# Patient Record
Sex: Female | Born: 1970
Health system: Southern US, Community
[De-identification: ages and names within clinical notes are randomized; demographics above are authoritative.]

## PROBLEM LIST (undated history)

## (undated) DIAGNOSIS — D649 Anemia, unspecified: Secondary | ICD-10-CM

## (undated) DIAGNOSIS — T883XXA Malignant hyperthermia due to anesthesia, initial encounter: Secondary | ICD-10-CM

## (undated) DIAGNOSIS — F419 Anxiety disorder, unspecified: Secondary | ICD-10-CM

## (undated) DIAGNOSIS — F17210 Nicotine dependence, cigarettes, uncomplicated: Secondary | ICD-10-CM

## (undated) DIAGNOSIS — I480 Paroxysmal atrial fibrillation: Secondary | ICD-10-CM

## (undated) DIAGNOSIS — G4730 Sleep apnea, unspecified: Secondary | ICD-10-CM

## (undated) DIAGNOSIS — K219 Gastro-esophageal reflux disease without esophagitis: Secondary | ICD-10-CM

## (undated) DIAGNOSIS — I10 Essential (primary) hypertension: Secondary | ICD-10-CM

## (undated) DIAGNOSIS — I493 Ventricular premature depolarization: Secondary | ICD-10-CM

## (undated) DIAGNOSIS — Z5189 Encounter for other specified aftercare: Secondary | ICD-10-CM

## (undated) DIAGNOSIS — M109 Gout, unspecified: Secondary | ICD-10-CM

## (undated) DIAGNOSIS — T7840XA Allergy, unspecified, initial encounter: Secondary | ICD-10-CM

## (undated) HISTORY — DX: Allergy, unspecified, initial encounter: T78.40XA

## (undated) HISTORY — DX: Encounter for other specified aftercare: Z51.89

## (undated) HISTORY — PX: TUBAL LIGATION: SHX77

## (undated) HISTORY — DX: Anxiety disorder, unspecified: F41.9

## (undated) HISTORY — DX: Sleep apnea, unspecified: G47.30

## (undated) HISTORY — PX: NECK SURGERY: SHX720

## (undated) HISTORY — DX: Gastro-esophageal reflux disease without esophagitis: K21.9

## (undated) HISTORY — DX: Gout, unspecified: M10.9

## (undated) HISTORY — DX: Anemia, unspecified: D64.9

---

## 2008-12-20 ENCOUNTER — Inpatient Hospital Stay (HOSPITAL_COMMUNITY): Admission: AD | Admit: 2008-12-20 | Discharge: 2008-12-20 | Payer: Self-pay | Admitting: Obstetrics and Gynecology

## 2009-01-08 ENCOUNTER — Ambulatory Visit (HOSPITAL_COMMUNITY): Admission: RE | Admit: 2009-01-08 | Discharge: 2009-01-08 | Payer: Self-pay | Admitting: Obstetrics and Gynecology

## 2009-03-22 DIAGNOSIS — M109 Gout, unspecified: Secondary | ICD-10-CM

## 2009-03-22 HISTORY — DX: Gout, unspecified: M10.9

## 2009-05-13 ENCOUNTER — Ambulatory Visit (HOSPITAL_COMMUNITY): Admission: RE | Admit: 2009-05-13 | Discharge: 2009-05-13 | Payer: Self-pay | Admitting: Obstetrics and Gynecology

## 2009-06-02 ENCOUNTER — Inpatient Hospital Stay (HOSPITAL_COMMUNITY): Admission: RE | Admit: 2009-06-02 | Discharge: 2009-06-04 | Payer: Self-pay | Admitting: Obstetrics and Gynecology

## 2009-06-02 ENCOUNTER — Encounter (INDEPENDENT_AMBULATORY_CARE_PROVIDER_SITE_OTHER): Payer: Self-pay | Admitting: Obstetrics and Gynecology

## 2009-06-03 ENCOUNTER — Encounter (INDEPENDENT_AMBULATORY_CARE_PROVIDER_SITE_OTHER): Payer: Self-pay | Admitting: Obstetrics and Gynecology

## 2010-06-15 LAB — CBC
HCT: 27.6 % — ABNORMAL LOW (ref 36.0–46.0)
Hemoglobin: 10.2 g/dL — ABNORMAL LOW (ref 12.0–15.0)
Hemoglobin: 9 g/dL — ABNORMAL LOW (ref 12.0–15.0)
Hemoglobin: 9.4 g/dL — ABNORMAL LOW (ref 12.0–15.0)
MCHC: 32.7 g/dL (ref 30.0–36.0)
MCHC: 33.2 g/dL (ref 30.0–36.0)
MCV: 88.8 fL (ref 78.0–100.0)
RBC: 3.11 MIL/uL — ABNORMAL LOW (ref 3.87–5.11)
RBC: 3.24 MIL/uL — ABNORMAL LOW (ref 3.87–5.11)
RDW: 14 % (ref 11.5–15.5)
WBC: 11.1 10*3/uL — ABNORMAL HIGH (ref 4.0–10.5)
WBC: 17 10*3/uL — ABNORMAL HIGH (ref 4.0–10.5)
WBC: 18 10*3/uL — ABNORMAL HIGH (ref 4.0–10.5)

## 2010-06-15 LAB — URINALYSIS, MICROSCOPIC ONLY
Ketones, ur: NEGATIVE mg/dL
Leukocytes, UA: NEGATIVE
Nitrite: NEGATIVE
Urobilinogen, UA: 0.2 mg/dL (ref 0.0–1.0)
WBC, UA: NONE SEEN WBC/hpf (ref <3)

## 2010-06-15 LAB — COMPREHENSIVE METABOLIC PANEL
Albumin: 2.4 g/dL — ABNORMAL LOW (ref 3.5–5.2)
Alkaline Phosphatase: 118 U/L — ABNORMAL HIGH (ref 39–117)
BUN: 3 mg/dL — ABNORMAL LOW (ref 6–23)
Chloride: 101 mEq/L (ref 96–112)
Creatinine, Ser: 0.5 mg/dL (ref 0.4–1.2)
GFR calc Af Amer: >60 mL/min (ref >60)
GFR calc non Af Amer: >60 mL/min (ref >60)
Glucose, Bld: 109 mg/dL — ABNORMAL HIGH (ref 70–99)
Total Protein: 5.4 g/dL — ABNORMAL LOW (ref 6.0–8.3)

## 2010-06-15 LAB — LACTATE DEHYDROGENASE: LDH: 165 U/L (ref 94–250)

## 2010-06-15 LAB — URIC ACID: Uric Acid, Serum: 4.2 mg/dL (ref 2.4–7.0)

## 2010-06-25 LAB — COMPREHENSIVE METABOLIC PANEL
AST: 16 U/L (ref 0–37)
Albumin: 3.4 g/dL — ABNORMAL LOW (ref 3.5–5.2)
CO2: 24 mEq/L (ref 19–32)
Calcium: 9.1 mg/dL (ref 8.4–10.5)
Chloride: 105 mEq/L (ref 96–112)
Creatinine, Ser: 0.42 mg/dL (ref 0.4–1.2)
Glucose, Bld: 79 mg/dL (ref 70–99)
Sodium: 136 mEq/L (ref 135–145)

## 2010-06-25 LAB — CBC
HCT: 34.5 % — ABNORMAL LOW (ref 36.0–46.0)
Platelets: 329 10*3/uL (ref 150–400)
RBC: 3.8 MIL/uL — ABNORMAL LOW (ref 3.87–5.11)

## 2010-06-25 LAB — DIFFERENTIAL
Basophils Absolute: 0 10*3/uL (ref 0.0–0.1)
Basophils Relative: 0 % (ref 0–1)
Eosinophils Relative: 2 % (ref 0–5)
Monocytes Relative: 4 % (ref 3–12)
Neutrophils Relative %: 68 % (ref 43–77)

## 2011-12-14 ENCOUNTER — Emergency Department (HOSPITAL_COMMUNITY)
Admission: EM | Admit: 2011-12-14 | Discharge: 2011-12-14 | Disposition: A | Discharge status: Home / Self Care | Payer: BC Managed Care – PPO | Attending: Emergency Medicine | Admitting: Emergency Medicine

## 2011-12-14 ENCOUNTER — Encounter (HOSPITAL_COMMUNITY): Payer: Self-pay | Admitting: Emergency Medicine

## 2011-12-14 ENCOUNTER — Ambulatory Visit (INDEPENDENT_AMBULATORY_CARE_PROVIDER_SITE_OTHER): Payer: BC Managed Care – PPO | Admitting: Emergency Medicine

## 2011-12-14 VITALS — BP 142/102 | HR 91 | Temp 98.6°F | Resp 16 | Ht 62.5 in | Wt 163.4 lb

## 2011-12-14 DIAGNOSIS — J40 Bronchitis, not specified as acute or chronic: Secondary | ICD-10-CM

## 2011-12-14 DIAGNOSIS — K529 Noninfective gastroenteritis and colitis, unspecified: Secondary | ICD-10-CM

## 2011-12-14 DIAGNOSIS — R197 Diarrhea, unspecified: Secondary | ICD-10-CM | POA: Insufficient documentation

## 2011-12-14 DIAGNOSIS — R5381 Other malaise: Secondary | ICD-10-CM | POA: Insufficient documentation

## 2011-12-14 DIAGNOSIS — A088 Other specified intestinal infections: Secondary | ICD-10-CM

## 2011-12-14 HISTORY — DX: Essential (primary) hypertension: I10

## 2011-12-14 LAB — CBC WITH DIFFERENTIAL/PLATELET
Basophils Absolute: 0 10*3/uL (ref 0.0–0.1)
Basophils Relative: 1 % (ref 0–1)
Eosinophils Absolute: 0.1 10*3/uL (ref 0.0–0.7)
HCT: 40.7 % (ref 36.0–46.0)
Lymphocytes Relative: 60 % — ABNORMAL HIGH (ref 12–46)
MCV: 84.6 fL (ref 78.0–100.0)
Neutro Abs: 1 10*3/uL — ABNORMAL LOW (ref 1.7–7.7)
RBC: 4.81 MIL/uL (ref 3.87–5.11)
WBC: 3.9 10*3/uL — ABNORMAL LOW (ref 4.0–10.5)

## 2011-12-14 LAB — BASIC METABOLIC PANEL
BUN: 6 mg/dL (ref 6–23)
CO2: 24 mEq/L (ref 19–32)
Chloride: 102 mEq/L (ref 96–112)
Creatinine, Ser: 0.69 mg/dL (ref 0.50–1.10)
GFR calc Af Amer: >90 mL/min (ref >90)
Sodium: 138 mEq/L (ref 135–145)

## 2011-12-14 MED ORDER — HYDROCOD POLST-CHLORPHEN POLST 10-8 MG/5ML PO LQCR: 5.0000 mL | Freq: Two times a day (BID) | ORAL | Status: DC | PRN

## 2011-12-14 MED ORDER — LOPERAMIDE HCL 2 MG PO TABS: ORAL_TABLET | ORAL | Status: DC

## 2011-12-14 NOTE — Patient Instructions (Addendum)
Hypokalemia Hypokalemia means a low potassium level in the blood.Potassium is an electrolyte that helps regulate the amount of fluid in the body. It also stimulates muscle contraction and maintains a stable acid-base balance.Most of the body's potassium is inside of cells, and only a very small amount is in the blood. Because the amount in the blood is so small, minor changes can have big effects. PREPARATION FOR TEST Testing for potassium requires taking a blood sample taken by needle from a vein in the arm. The skin is cleaned thoroughly before the sample is drawn. There is no other special preparation needed. NORMAL VALUES Potassium levels below 3.5 mEq/L are abnormally low. Levels above 5.1 mEq/L are abnormally high. Ranges for normal findings may vary among different laboratories and hospitals. You should always check with your doctor after having lab work or other tests done to discuss the meaning of your test results and whether your values are considered within normal limits. MEANING OF TEST  Your caregiver will go over the test results with you and discuss the importance and meaning of your results, as well as treatment options and the need for additional tests, if necessary. A potassium level is frequently part of a routine medical exam. It is usually included as part of a whole "panel" of tests for several blood salts (such as Sodium and Chloride). It may be done as part of follow-up when a low potassium level was found in the past or other blood salts are suspected of being out of balance. A low potassium level might be suspected if you have one or more of the following:  Symptoms of weakness.   Abnormal heart rhythms.   High blood pressure and are taking medication to control this, especially water pills (diuretics).   Kidney disease that can affect your potassium level .   Diabetes requiring the use of insulin. The potassium may fall after taking insulin, especially if the  diabetes had been out of control for a while.   A condition requiring the use of cortisone-type medication or certain types of antibiotics.   Vomiting and/or diarrhea for more than a day or two.   A stomach or intestinal condition that may not permit appropriate absorption of potassium.   Fainting episodes.   Mental confusion.  OBTAINING TEST RESULTS It is your responsibility to obtain your test results. Ask the lab or department performing the test when and how you will get your results.  Please contact your caregiver directly if you have not received the results within one week. At that time, ask if there is anything different or new you should be doing in relation to the results. TREATMENT Hypokalemia can be treated with potassium supplements taken by mouth and/or adjustments in your current medications. A diet high in potassium is also helpful. Foods with high potassium content are:  Peas, lentils, lima beans, nuts, and dried fruit.   Whole grain and bran cereals and breads.   Fresh fruit, vegetables (bananas, cantaloupe, grapefruit, oranges, tomatoes, honeydew melons, potatoes).   Orange and tomato juices.   Meats. If potassium supplement has been prescribed for you today or your medications have been adjusted, see your personal caregiver in time02 for a re-check.  SEEK MEDICAL CARE IF:  There is a feeling of worsening weakness.   You experience repeated chest palpitations.   You are diabetic and having difficulty keeping your blood sugars in the normal range.   You are experiencing vomiting and/or diarrhea.   You are having  difficulty with any of your regular medications.  SEEK IMMEDIATE MEDICAL CARE IF:  You experience chest pain, shortness of breath, or episodes of dizziness.   You have been having vomiting or diarrhea for more than 2 days.   You have a fainting episode.  MAKE SURE YOU:   Understand these instructions.   Will watch your condition.   Will  get help right away if you are not doing well or get worse.  Document Released: 03/08/2005 Document Revised: 02/25/2011 Document Reviewed: 02/17/2008 [facility] Patient Information 2012 [place], [place].Bronchitis Bronchitis is the body's way of reacting to injury and/or infection (inflammation) of the bronchi. Bronchi are the air tubes that extend from the windpipe into the lungs. If the inflammation becomes severe, it may cause shortness of breath. CAUSES  Inflammation may be caused by:  A virus.   Germs (bacteria).   Dust.   Allergens.   Pollutants and many other irritants.  The cells lining the bronchial tree are covered with tiny hairs (cilia). These constantly beat upward, away from the lungs, toward the mouth. This keeps the lungs free of pollutants. When these cells become too irritated and are unable to do their job, mucus begins to develop. This causes the characteristic cough of bronchitis. The cough clears the lungs when the cilia are unable to do their job. Without either of these protective mechanisms, the mucus would settle in the lungs. Then you would develop pneumonia. Smoking is a common cause of bronchitis and can contribute to pneumonia. Stopping this habit is the single most important thing you can do to help yourself. TREATMENT   Your caregiver may prescribe an antibiotic if the cough is caused by bacteria. Also, medicines that open up your airways make it easier to breathe. Your caregiver may also recommend or prescribe an expectorant. It will loosen the mucus to be coughed up. Only take over-the-counter or prescription medicines for pain, discomfort, or fever as directed by your caregiver.   Removing whatever causes the problem (smoking, for example) is critical to preventing the problem from getting worse.   Cough suppressants may be prescribed for relief of cough symptoms.   Inhaled medicines may be prescribed to help with symptoms now and to help prevent problems  from returning.   For those with recurrent (chronic) bronchitis, there may be a need for steroid medicines.  SEEK IMMEDIATE MEDICAL CARE IF:   During treatment, you develop more pus-like mucus (purulent sputum).   You have a fever.   Your baby is older than 3 months with a rectal temperature of 102 F (38.9 C) or higher.   Your baby is 48 months old or younger with a rectal temperature of 100.4 F (38 C) or higher.   You become progressively more ill.   You have increased difficulty breathing, wheezing, or shortness of breath.  It is necessary to seek immediate medical care if you are elderly or sick from any other disease. MAKE SURE YOU:   Understand these instructions.   Will watch your condition.   Will get help right away if you are not doing well or get worse.  Document Released: 03/08/2005 Document Revised: 02/25/2011 Document Reviewed: 01/16/2008 [facility] Patient Information 2012 [place], [place].Diarrhea Infections caused by germs (bacterial) or a virus commonly cause diarrhea. Your caregiver has determined that with time, rest and fluids, the diarrhea should improve. In general, eat normally while drinking more water than usual. Although water may prevent dehydration, it does not contain salt and minerals (electrolytes). Broths,  weak tea without caffeine and oral rehydration solutions (ORS) replace fluids and electrolytes. Small amounts of fluids should be taken frequently. Large amounts at one time may not be tolerated. Plain water may be harmful in infants and the elderly. Oral rehydrating solutions (ORS) are available at pharmacies and grocery stores. ORS replace water and important electrolytes in proper proportions. Sports drinks are not as effective as ORS and may be harmful due to sugars worsening diarrhea.  ORS is especially recommended for use in children with diarrhea. As a general guideline for children, replace any new fluid losses from diarrhea and/or vomiting with  ORS as follows:   If your child weighs 22 pounds or under (10 kg or less), give 60-120 mL ( -  cup or 2 - 4 ounces) of ORS for each episode of diarrheal stool or vomiting episode.   If your child weighs more than 22 pounds (more than 10 kgs), give 120-240 mL ( - 1 cup or 4 - 8 ounces) of ORS for each diarrheal stool or episode of vomiting.   While correcting for dehydration, children should eat normally. However, foods high in sugar should be avoided because this may worsen diarrhea. Large amounts of carbonated soft drinks, juice, gelatin desserts and other highly sugared drinks should be avoided.   After correction of dehydration, other liquids that are appealing to the child may be added. Children should drink small amounts of fluids frequently and fluids should be increased as tolerated. Children should drink enough fluids to keep urine clear or pale yellow.   Adults should eat normally while drinking more fluids than usual. Drink small amounts of fluids frequently and increase as tolerated. Drink enough fluids to keep urine clear or pale yellow. Broths, weak decaffeinated tea, lemon lime soft drinks (allowed to go flat) and ORS replace fluids and electrolytes.   Avoid:   Carbonated drinks.   Juice.   Extremely hot or cold fluids.   Caffeine drinks.   Fatty, greasy foods.   Alcohol.   Tobacco.   Too much intake of anything at one time.   Gelatin desserts.   Probiotics are active cultures of beneficial bacteria. They may lessen the amount and number of diarrheal stools in adults. Probiotics can be found in yogurt with active cultures and in supplements.   Wash hands well to avoid spreading bacteria and virus.   Anti-diarrheal medications are not recommended for infants and children.   Only take over-the-counter or prescription medicines for pain, discomfort or fever as directed by your caregiver. Do not give aspirin to children because it may cause Reye's Syndrome.   For  adults, ask your caregiver if you should continue all prescribed and over-the-counter medicines.   If your caregiver has given you a follow-up appointment, it is very important to keep that appointment. Not keeping the appointment could result in a chronic or permanent injury, and disability. If there is any problem keeping the appointment, you must call back to this facility for assistance.  SEEK IMMEDIATE MEDICAL CARE IF:   You or your child is unable to keep fluids down or other symptoms or problems become worse in spite of treatment.   Vomiting or diarrhea develops and becomes persistent.   There is vomiting of blood or bile (green material).   There is blood in the stool or the stools are black and tarry.   There is no urine output in 6-8 hours or there is only a small amount of very dark urine.  Abdominal pain develops, increases or localizes.   You have a fever.   Your baby is older than 3 months with a rectal temperature of 102 F (38.9 C) or higher.   Your baby is 5 months old or younger with a rectal temperature of 100.4 F (38 C) or higher.   You or your child develops excessive weakness, dizziness, fainting or extreme thirst.   You or your child develops a rash, stiff neck, severe headache or become irritable or sleepy and difficult to awaken.  MAKE SURE YOU:   Understand these instructions.   Will watch your condition.   Will get help right away if you are not doing well or get worse.  Document Released: 02/26/2002 Document Revised: 02/25/2011 Document Reviewed: 01/13/2009 [facility] Patient Information 2012 [place], [place].

## 2011-12-14 NOTE — ED Notes (Signed)
Pt reports flu like symptoms since Saturday. Pt reports diarrhea, body aches and cough. Pt has been taking thera flu at home. Pt reports that her kids at home were sick also.

## 2011-12-14 NOTE — Progress Notes (Signed)
   Date:  12/14/2011   Name:  Emma Stephens   DOB:  25-May-1970   MRN:  [id number] Gender: female Age: 41 y.o.  PCP:  No primary provider on file.    Chief Complaint: Influenza, Diarrhea and Cough   History of Present Illness:  Gentry Seeber is a 41 y.o. pleasant patient who presents with the following:  Ill since Friday with diarrhea that has been very frequent watery diarrhea.  No blood mucous or pus in stools.  No nausea or vomiting.  Has two sick contacts at home (children).  Has a headache and around eyes. No rhinorrhea or post nasal drainage, no sore throat.  Has a cough that is purulent in color.  Has felt hot and cold through yesterday but no documented fever.  No improvement with OTC medication .  Has myalgias and arthralgias.  Fatigued.  No other complaints.  There is no problem list on file for this patient.   Past Medical History  Diagnosis Date  . Hypertension     Past Surgical History  Procedure Date  . Neck surgery   . Tubal ligation     History  Substance Use Topics  . Smoking status: Current Every Day Smoker  . Smokeless tobacco: Not on file  . Alcohol Use: No    No family history on file.  No Known Allergies  Medication list has been reviewed and updated.  No outpatient prescriptions prior to visit.    Review of Systems:  As per HPI, otherwise negative.    Physical Examination: Filed Vitals:   12/14/11 1644  BP: 142/102  Pulse: 91  Temp: 98.6 F (37 C)  Resp: 16   Filed Vitals:   12/14/11 1644  Height: 5' 2.5" (1.588 m)  Weight: 163 lb 6.4 oz (74.118 kg)   Body mass index is 29.41 kg/(m^2). Ideal Body Weight: Weight in (lb) to have BMI = 25: 138.6   GEN: WDWN, ill appearing.  Not septic or icteric.  No rash.   Non-toxic, A & O x 3 HEENT: Atraumatic, Normocephalic. Neck supple. No masses, No LAD. Oropharynx benign Ears and Nose: No external deformity.  TM negative CV: RRR, No M/G/R. No JVD. No thrill. No extra heart  sounds. PULM: CTA B, no wheezes, crackles, rhonchi. No retractions. No resp. distress. No accessory muscle use. ABD: S, NT, ND, +BS. No rebound. No HSM. EXTR: No c/c/e NEURO Normal gait.  PSYCH: Normally interactive. Conversant. Not depressed or anxious appearing.  Calm demeanor.    Assessment and Plan: Gastroenteritis Bronchitis tussionex Imodium Follow up as needed Increase fluids, tylenol as needed Hypokalemia Bananas and orange juice.   Carmelina Dane, MD I have reviewed and agree with documentation. Robert P. Merla Riches, M.D.

## 2013-08-22 ENCOUNTER — Ambulatory Visit: Payer: BC Managed Care – PPO

## 2013-08-22 ENCOUNTER — Ambulatory Visit (INDEPENDENT_AMBULATORY_CARE_PROVIDER_SITE_OTHER): Payer: BC Managed Care – PPO | Admitting: Emergency Medicine

## 2013-08-22 VITALS — BP 138/92 | HR 88 | Temp 98.5°F | Resp 20 | Ht 61.0 in | Wt 131.0 lb

## 2013-08-22 DIAGNOSIS — M25569 Pain in unspecified knee: Secondary | ICD-10-CM

## 2013-08-22 DIAGNOSIS — M109 Gout, unspecified: Secondary | ICD-10-CM

## 2013-08-22 LAB — POCT CBC
GRANULOCYTE PERCENT: 45.3 % (ref 37–80)
HCT, POC: 40.6 % (ref 37.7–47.9)
Hemoglobin: 12.9 g/dL (ref 12.2–16.2)
LYMPH, POC: 3.3 (ref 0.6–3.4)
MCH, POC: 28 pg (ref 27–31.2)
MCHC: 31.8 g/dL (ref 31.8–35.4)
MCV: 88.1 fL (ref 80–97)
MID (CBC): 0.6 (ref 0–0.9)
MPV: 8.5 fL (ref 0–99.8)
POC GRANULOCYTE: 3.2 (ref 2–6.9)
POC LYMPH %: 46.8 % (ref 10–50)
POC MID %: 7.9 %M (ref 0–12)
Platelet Count, POC: 470 10*3/uL — AB (ref 142–424)
RBC: 4.61 M/uL (ref 4.04–5.48)
RDW, POC: 13.7 %
WBC: 7 10*3/uL (ref 4.6–10.2)

## 2013-08-22 LAB — URIC ACID: URIC ACID, SERUM: 5.6 mg/dL (ref 2.4–7.0)

## 2013-08-22 MED ORDER — HYDROCODONE-ACETAMINOPHEN 5-325 MG PO TABS: 1.0000 | ORAL_TABLET | Freq: Four times a day (QID) | ORAL | Status: DC | PRN

## 2013-08-22 NOTE — Patient Instructions (Signed)
Sciatica °Sciatica is pain, weakness, numbness, or tingling along the path of the sciatic nerve. The nerve starts in the lower back and runs down the back of each leg. The nerve controls the muscles in the lower leg and in the back of the knee, while also providing sensation to the back of the thigh, lower leg, and the sole of your foot. Sciatica is a symptom of another medical condition. For instance, nerve damage or certain conditions, such as a herniated disk or bone spur on the spine, pinch or put pressure on the sciatic nerve. This causes the pain, weakness, or other sensations normally associated with sciatica. Generally, sciatica only affects one side of the body. °CAUSES  °· Herniated or slipped disc. °· Degenerative disk disease. °· A pain disorder involving the narrow muscle in the buttocks (piriformis syndrome). °· Pelvic injury or fracture. °· Pregnancy. °· Tumor (rare). °SYMPTOMS  °Symptoms can vary from mild to very severe. The symptoms usually travel from the low back to the buttocks and down the back of the leg. Symptoms can include: °· Mild tingling or dull aches in the lower back, leg, or hip. °· Numbness in the back of the calf or sole of the foot. °· Burning sensations in the lower back, leg, or hip. °· Sharp pains in the lower back, leg, or hip. °· Leg weakness. °· Severe back pain inhibiting movement. °These symptoms may get worse with coughing, sneezing, laughing, or prolonged sitting or standing. Also, being overweight may worsen symptoms. °DIAGNOSIS  °Your caregiver will perform a physical exam to look for common symptoms of sciatica. He or she may ask you to do certain movements or activities that would trigger sciatic nerve pain. Other tests may be performed to find the cause of the sciatica. These may include: °· Blood tests. °· X-rays. °· Imaging tests, such as an MRI or CT scan. °TREATMENT  °Treatment is directed at the cause of the sciatic pain. Sometimes, treatment is not necessary  and the pain and discomfort goes away on its own. If treatment is needed, your caregiver may suggest: °· Over-the-counter medicines to relieve pain. °· Prescription medicines, such as anti-inflammatory medicine, muscle relaxants, or narcotics. °· Applying heat or ice to the painful area. °· Steroid injections to lessen pain, irritation, and inflammation around the nerve. °· Reducing activity during periods of pain. °· Exercising and stretching to strengthen your abdomen and improve flexibility of your spine. Your caregiver may suggest losing weight if the extra weight makes the back pain worse. °· Physical therapy. °· Surgery to eliminate what is pressing or pinching the nerve, such as a bone spur or part of a herniated disk. °HOME CARE INSTRUCTIONS  °· Only take over-the-counter or prescription medicines for pain or discomfort as directed by your caregiver. °· Apply ice to the affected area for 20 minutes, 3 4 times a day for the first 48 72 hours. Then try heat in the same way. °· Exercise, stretch, or perform your usual activities if these do not aggravate your pain. °· Attend physical therapy sessions as directed by your caregiver. °· Keep all follow-up appointments as directed by your caregiver. °· Do not wear high heels or shoes that do not provide proper support. °· Check your mattress to see if it is too soft. A firm mattress may lessen your pain and discomfort. °SEEK IMMEDIATE MEDICAL CARE IF:  °· You lose control of your bowel or bladder (incontinence). °· You have increasing weakness in the lower back,   pelvis, buttocks, or legs. °· You have redness or swelling of your back. °· You have a burning sensation when you urinate. °· You have pain that gets worse when you lie down or awakens you at night. °· Your pain is worse than you have experienced in the past. °· Your pain is lasting longer than 4 weeks. °· You are suddenly losing weight without reason. °MAKE SURE YOU: °· Understand these  instructions. °· Will watch your condition. °· Will get help right away if you are not doing well or get worse. °Document Released: 03/02/2001 Document Revised: 09/07/2011 Document Reviewed: 07/18/2011 °[employer]® Patient Information ©2014 [employer]. ° °

## 2013-08-22 NOTE — Progress Notes (Signed)
Subjective:  This chart was scribed for Darlyne Russian, MD by Delphia Grates, ED Scribe. This patient was seen in room Room/bed 11 and the patient's care was started at 1:44 PM.   Patient ID: Emma Stephens, female    DOB: 1970/06/10, 43 y.o.   MRN: [id number]  HPI  HPI Comments: Emma Stephens is a 43 y.o. female who presents to the [facility] complaining of gradually worsening, constant right knee pain onset 1 day ago. Patient states she was at work (at [employer]) when she felt a shooting pain in her right knee. Patient states she feels the pain in the back of her leg and it radiates to her right buttock. Patient states the pain feels similar to a "pinched nerve" or "gout". She reports prior history of similar pain in her left knee, except it did not radiate. There is associated numbness when she is laying down on her right side. She reports she applied ice with some relief, and has also taken Aleve and Vicodin with temporary relief. Patient was last checked for gout 1 year ago, but is unsure what she was given for treatment.    Review of Systems  Musculoskeletal: Positive for arthralgias (right knee).  Neurological: Positive for numbness.       Objective:   Physical Exam  CONSTITUTIONAL: Well developed/well nourished HEAD: Normocephalic/atraumatic EYES: EOMI/PERRL ENMT: Mucous membranes moist NECK: supple no meningeal signs SPINE:entire spine nontender CV: S1/S2 noted, no murmurs/rubs/gallops noted LUNGS: Lungs are clear to auscultation bilaterally, no apparent distress ABDOMEN: soft, nontender, no rebound or guarding GU:no cva tenderness NEURO: Pt is awake/alert, moves all extremitiesx4 EXTREMITIES: pulses normal, decreased ROM in right knee. Fullness in the right popliteal area consistent with a popliteal cyst. Tenderness in the right buttock. knee reflex 2+ and symmetrical. Left ankle 2+ and absent in right ankle. Patient laying on her left side and  needs assistance to get into sitting position., SKIN: warm, color normal PSYCH: no abnormalities of mood noted  [facility] reading (PRIMARY) by  Dr.Truth Wolaver there appears to be a Schmorl's node at T12 no acute changes are seen knee films appear normal with mild degenerative changes . Results for orders placed in visit on 08/22/13  POCT CBC      Result Value Ref Range   WBC 7.0  4.6 - 10.2 K/uL   Lymph, poc 3.3  0.6 - 3.4   POC LYMPH PERCENT 46.8  10 - 50 %L   MID (cbc) 0.6  0 - 0.9   POC MID % 7.9  0 - 12 %M   POC Granulocyte 3.2  2 - 6.9   Granulocyte percent 45.3  37 - 80 %G   RBC 4.61  4.04 - 5.48 M/uL   Hemoglobin 12.9  12.2 - 16.2 g/dL   HCT, POC 40.6  37.7 - 47.9 %   MCV 88.1  80 - 97 fL   MCH, POC 28.0  27 - 31.2 pg   MCHC 31.8  31.8 - 35.4 g/dL   RDW, POC 13.7     Platelet Count, POC 470 (*) 142 - 424 K/uL   MPV 8.5  0 - 99.8 fL        Assessment & Plan:  She appears to have a popliteal cyst as well as back pain possibly related to a radiculopathy versus gout. Would advise Aleve 2 twice a day hydrocodone for pain and she was given a note for work. Uric acid was drawn and pending we'll  recheck on Friday if not feeling better I personally performed the services described in this documentation, which was scribed in my presence. The recorded information has been reviewed and is accurate.

## 2014-03-07 LAB — HM PAP SMEAR: HM PAP: NORMAL

## 2014-03-07 LAB — HM MAMMOGRAPHY: HM Mammogram: NORMAL

## 2014-04-15 ENCOUNTER — Ambulatory Visit: Payer: Self-pay | Admitting: Physician Assistant

## 2014-04-22 ENCOUNTER — Ambulatory Visit: Payer: Self-pay | Admitting: Physician Assistant

## 2014-04-29 ENCOUNTER — Ambulatory Visit: Payer: Self-pay | Admitting: Physician Assistant

## 2014-05-13 ENCOUNTER — Telehealth: Payer: Self-pay | Admitting: Physician Assistant

## 2014-05-13 ENCOUNTER — Ambulatory Visit: Payer: Self-pay | Admitting: Physician Assistant

## 2014-05-13 NOTE — Telephone Encounter (Signed)
Patient called to cancel her NP appointment this morning that was scheduled later this afternoon.  No-showed for her first NP appointment.  She is to be charged for her NS today.

## 2014-05-24 ENCOUNTER — Telehealth: Payer: Self-pay | Admitting: *Deleted

## 2014-05-24 ENCOUNTER — Encounter: Payer: Self-pay | Admitting: *Deleted

## 2014-05-24 NOTE — Telephone Encounter (Signed)
Pre-Visit Call completed with patient and chart updated.   Pre-Visit Info documented in Specialty Comments under [employer].    

## 2014-05-27 ENCOUNTER — Other Ambulatory Visit: Payer: Self-pay | Admitting: Physician Assistant

## 2014-05-27 ENCOUNTER — Encounter: Payer: Self-pay | Admitting: Physician Assistant

## 2014-05-27 ENCOUNTER — Ambulatory Visit (INDEPENDENT_AMBULATORY_CARE_PROVIDER_SITE_OTHER): Payer: BLUE CROSS/BLUE SHIELD | Admitting: Physician Assistant

## 2014-05-27 ENCOUNTER — Ambulatory Visit (HOSPITAL_BASED_OUTPATIENT_CLINIC_OR_DEPARTMENT_OTHER)
Admission: RE | Admit: 2014-05-27 | Discharge: 2014-05-27 | Disposition: A | Discharge status: Home / Self Care | Payer: BLUE CROSS/BLUE SHIELD | Source: Ambulatory Visit | Attending: Physician Assistant | Admitting: Physician Assistant

## 2014-05-27 VITALS — BP 174/97 | HR 87 | Temp 98.4°F | Ht 61.75 in | Wt 167.1 lb

## 2014-05-27 DIAGNOSIS — M25561 Pain in right knee: Secondary | ICD-10-CM

## 2014-05-27 DIAGNOSIS — I10 Essential (primary) hypertension: Secondary | ICD-10-CM

## 2014-05-27 DIAGNOSIS — M25569 Pain in unspecified knee: Secondary | ICD-10-CM | POA: Insufficient documentation

## 2014-05-27 MED ORDER — MELOXICAM 15 MG PO TABS: 15.0000 mg | ORAL_TABLET | Freq: Every day | ORAL | Status: DC

## 2014-05-27 MED ORDER — HYDROCHLOROTHIAZIDE 25 MG PO TABS: 12.5000 mg | ORAL_TABLET | Freq: Every day | ORAL | Status: DC

## 2014-05-27 NOTE — Assessment & Plan Note (Signed)
Symptomatic.  EKG reveals NSR.  Will begin HCTZ 12.5 mg daily.  DASH diet discussed.  Handout given. Follow-up 3-4 weeks.

## 2014-05-27 NOTE — Assessment & Plan Note (Signed)
Will obtain x-ray of R knee to further assess.  RICE therapy.  Rx mobic to take daily.  Continue bracing.  Follow-up in 3-4 weeks.  Sooner if no improvement.

## 2014-05-27 NOTE — Progress Notes (Signed)
Patient presents to clinic today to establish care.  Hypertension -- Endorses long-standing history beginning with her first child. Was previously on Lisinopril with good control.  Patient weaned of of medication several years ago.  Has been getting home BP measurements averaging 189/111.  Patient endorses lightheadedness and some intermittent chest pressure. Patient denies vision changes or frequent headaches.  BP 174/97 in clinic today.  Patient endorses R knee pain x 1 day.  Denies trauma or injury.  Denies bruising but endorses swelling.  Has not taken anything for symptoms.  Health Maintenance: Dental -- up-to-date Vision -- up-to-date Immunizations -- Declines flu.  Tetanus up-to-date Mammogram -- Endorses up-to-date.  Followed by GYN PAP -- Endorses up-to-date.  Followed by GYN  Past Medical History  Diagnosis Date  . Hypertension   . Gout 2011    Past Surgical History  Procedure Laterality Date  . Neck surgery    . Tubal ligation      No current outpatient prescriptions on file prior to visit.   No current facility-administered medications on file prior to visit.    No Known Allergies  Family History  Problem Relation Age of Onset  . Diabetes Mother   . Hypertension Mother   . Cancer Father 74    oral  . Diabetes Sister   . Hypertension Sister   . Diabetes Brother   . Hypertension Brother     History   Social History  . Marital Status: Married    Spouse Name: N/A  . Number of Children: N/A  . Years of Education: N/A   Occupational History  . Not on file.   Social History Main Topics  . Smoking status: Current Every Day Smoker -- 1.00 packs/day    Types: Cigarettes  . Smokeless tobacco: Never Used  . Alcohol Use: No  . Drug Use: No  . Sexual Activity:    Partners: Male   Other Topics Concern  . Not on file   Social History Narrative   ROS Pertinent ROS are listed in the HPI.  BP 174/97 mmHg  Pulse 87  Temp(Src) 98.4 F (36.9 C)  (Oral)  Ht 5' 1.75" (1.568 m)  Wt 167 lb 2 oz (75.807 kg)  BMI 30.83 kg/m2  SpO2 100%  Physical Exam  Constitutional: She is oriented to person, place, and time and well-developed, well-nourished, and in no distress.  HENT:  Head: Normocephalic and atraumatic.  Eyes: Conjunctivae are normal. Pupils are equal, round, and reactive to light.  Neck: Neck supple.  Cardiovascular: Normal rate, regular rhythm, normal heart sounds and intact distal pulses.   Pulmonary/Chest: Effort normal and breath sounds normal. No respiratory distress. She has no wheezes. She has no rales. She exhibits no tenderness.  Neurological: She is alert and oriented to person, place, and time.  Skin: Skin is warm and dry. No rash noted.  Psychiatric: Affect normal.  Vitals reviewed.  Recent Results (from the past 2160 hour(s))  HM MAMMOGRAPHY     Status: None   Collection Time: 03/07/14 12:00 AM  Result Value Ref Range   HM Mammogram normal per patient- with Dr. Ulanda Edison   HM PAP SMEAR     Status: None   Collection Time: 03/07/14 12:00 AM  Result Value Ref Range   HM Pap smear normal per patient- with Dr. Ulanda Edison     Assessment/Plan: Recurrent knee pain Will obtain x-ray of R knee to further assess.  RICE therapy.  Rx mobic to take daily.  Continue bracing.  Follow-up in 3-4 weeks.  Sooner if no improvement.   Essential hypertension, benign Symptomatic.  EKG reveals NSR.  Will begin HCTZ 12.5 mg daily.  DASH diet discussed.  Handout given. Follow-up 3-4 weeks.

## 2014-05-27 NOTE — Progress Notes (Signed)
Pre visit review using our clinic review tool, if applicable. No additional management support is needed unless otherwise documented below in the visit note. 

## 2014-05-27 NOTE — Patient Instructions (Signed)
For the Knee -- please continue wearing brace.  Take Meloxicam daily as directed with food.  Elevate the leg while resting.  Don't forget to go to the 1st floor for x-ray.  I will call you with your results.  For the Blood Pressure -- Please limit salt intake.  Take your medication as directed.  Read information below on the DASH diet.    Follow-up 3-4 weeks for BP and Knee.  DASH Eating Plan DASH stands for "Dietary Approaches to Stop Hypertension." The DASH eating plan is a healthy eating plan that has been shown to reduce high blood pressure (hypertension). Additional health benefits may include reducing the risk of type 2 diabetes mellitus, heart disease, and stroke. The DASH eating plan may also help with weight loss. WHAT DO I NEED TO KNOW ABOUT THE DASH EATING PLAN? For the DASH eating plan, you will follow these general guidelines:  Choose foods with a percent daily value for sodium of less than 5% (as listed on the food label).  Use salt-free seasonings or herbs instead of table salt or sea salt.  Check with your health care provider or pharmacist before using salt substitutes.  Eat lower-sodium products, often labeled as "lower sodium" or "no salt added."  Eat fresh foods.  Eat more vegetables, fruits, and low-fat dairy products.  Choose whole grains. Look for the word "whole" as the first word in the ingredient list.  Choose fish and skinless chicken or [place] more often than red meat. Limit fish, poultry, and meat to 6 oz (170 g) each day.  Limit sweets, desserts, sugars, and sugary drinks.  Choose heart-healthy fats.  Limit cheese to 1 oz (28 g) per day.  Eat more home-cooked food and less restaurant, buffet, and fast food.  Limit fried foods.  Cook foods using methods other than frying.  Limit canned vegetables. If you do use them, rinse them well to decrease the sodium.  When eating at a restaurant, ask that your food be prepared with less salt, or no salt if  possible. WHAT FOODS CAN I EAT? Seek help from a dietitian for individual calorie needs. Grains Whole grain or whole wheat bread. Brown rice. Whole grain or whole wheat pasta. Quinoa, bulgur, and whole grain cereals. Low-sodium cereals. Corn or whole wheat flour tortillas. Whole grain cornbread. Whole grain crackers. Low-sodium crackers. Vegetables Fresh or frozen vegetables (raw, steamed, roasted, or grilled). Low-sodium or reduced-sodium tomato and vegetable juices. Low-sodium or reduced-sodium tomato sauce and paste. Low-sodium or reduced-sodium canned vegetables.  Fruits All fresh, canned (in natural juice), or frozen fruits. Meat and Other Protein Products Ground beef (85% or leaner), grass-fed beef, or beef trimmed of fat. Skinless chicken or [place]. Ground chicken or [place]. Pork trimmed of fat. All fish and seafood. Eggs. Dried beans, peas, or lentils. Unsalted nuts and seeds. Unsalted canned beans. Dairy Low-fat dairy products, such as skim or 1% milk, 2% or reduced-fat cheeses, low-fat ricotta or cottage cheese, or plain low-fat yogurt. Low-sodium or reduced-sodium cheeses. Fats and Oils Tub margarines without trans fats. Light or reduced-fat mayonnaise and salad dressings (reduced sodium). Avocado. Safflower, olive, or canola oils. Natural peanut or almond butter. Other Unsalted popcorn and pretzels. The items listed above may not be a complete list of recommended foods or beverages. Contact your dietitian for more options. WHAT FOODS ARE NOT RECOMMENDED? Grains White bread. White pasta. White rice. Refined cornbread. Bagels and croissants. Crackers that contain trans fat. Vegetables Creamed or fried vegetables. Vegetables in a  cheese sauce. Regular canned vegetables. Regular canned tomato sauce and paste. Regular tomato and vegetable juices. Fruits Dried fruits. Canned fruit in light or heavy syrup. Fruit juice. Meat and Other Protein Products Fatty cuts of meat. Ribs, chicken  wings, bacon, sausage, bologna, salami, chitterlings, fatback, hot dogs, bratwurst, and packaged luncheon meats. Salted nuts and seeds. Canned beans with salt. Dairy Whole or 2% milk, cream, half-and-half, and cream cheese. Whole-fat or sweetened yogurt. Full-fat cheeses or blue cheese. Nondairy creamers and whipped toppings. Processed cheese, cheese spreads, or cheese curds. Condiments Onion and garlic salt, seasoned salt, table salt, and sea salt. Canned and packaged gravies. Worcestershire sauce. Tartar sauce. Barbecue sauce. Teriyaki sauce. Soy sauce, including reduced sodium. Steak sauce. Fish sauce. Oyster sauce. Cocktail sauce. Horseradish. Ketchup and mustard. Meat flavorings and tenderizers. Bouillon cubes. Hot sauce. Tabasco sauce. Marinades. Taco seasonings. Relishes. Fats and Oils Butter, stick margarine, lard, shortening, ghee, and bacon fat. Coconut, palm kernel, or palm oils. Regular salad dressings. Other Pickles and olives. Salted popcorn and pretzels. The items listed above may not be a complete list of foods and beverages to avoid. Contact your dietitian for more information. WHERE CAN I FIND MORE INFORMATION? National Heart, Lung, and Blood Institute: travelstabloid.com Document Released: 02/25/2011 Document Revised: 07/23/2013 Document Reviewed: 01/10/2013 [facility] Patient Information 2015 [place], [place]. This information is not intended to replace advice given to you by your health care provider. Make sure you discuss any questions you have with your health care provider.

## 2014-06-17 ENCOUNTER — Ambulatory Visit: Payer: BLUE CROSS/BLUE SHIELD | Admitting: Physician Assistant

## 2014-06-18 ENCOUNTER — Ambulatory Visit: Payer: BLUE CROSS/BLUE SHIELD | Admitting: Physician Assistant

## 2014-06-24 ENCOUNTER — Encounter: Payer: Self-pay | Admitting: Physician Assistant

## 2014-06-24 ENCOUNTER — Ambulatory Visit (INDEPENDENT_AMBULATORY_CARE_PROVIDER_SITE_OTHER): Payer: BLUE CROSS/BLUE SHIELD | Admitting: Physician Assistant

## 2014-06-24 VITALS — BP 158/98 | HR 83 | Temp 98.4°F | Resp 16 | Ht 61.75 in | Wt 165.0 lb

## 2014-06-24 DIAGNOSIS — I10 Essential (primary) hypertension: Secondary | ICD-10-CM

## 2014-06-24 DIAGNOSIS — M25561 Pain in right knee: Secondary | ICD-10-CM

## 2014-06-24 MED ORDER — AMLODIPINE BESYLATE 5 MG PO TABS: 5.0000 mg | ORAL_TABLET | Freq: Every day | ORAL | Status: DC

## 2014-06-24 NOTE — Progress Notes (Signed)
Pre visit review using our clinic review tool, if applicable. No additional management support is needed unless otherwise documented below in the visit note/SLS  

## 2014-06-24 NOTE — Assessment & Plan Note (Signed)
Now with endorse prior history of gout, concern for gout flareup due to hydrochlorothiazide. Will DC hydrochlorothiazide and start another antihypertensive medication. Dietary measures discussed with patient. Continue RICE and mobic for acute flare-up.

## 2014-06-24 NOTE — Progress Notes (Signed)
    Patient presents to clinic today c/o recurrence of knee pain with swelling in the absence of trauma or injury. Recent x-ray of the right knee was reviewed; again noted to be negative for acute abnormality. Patient is able to weight-bear.  Patient does endorse history of gout. Is currently on hydrochlorothiazide for blood pressure. Has not taken today. BP elevated in clinic. Patient denies chest pain, palpitations, lightheadedness, dizziness, vision changes or frequent headaches.  Past Medical History  Diagnosis Date  . Hypertension   . Gout 2011    Current Outpatient Prescriptions on File Prior to Visit  Medication Sig Dispense Refill  . meloxicam (MOBIC) 15 MG tablet Take 1 tablet (15 mg total) by mouth daily. 30 tablet 0   No current facility-administered medications on file prior to visit.    No Known Allergies  Family History  Problem Relation Age of Onset  . Diabetes Mother   . Hypertension Mother   . Cancer Father 39    oral  . Diabetes Sister   . Hypertension Sister   . Diabetes Brother   . Hypertension Brother     History   Social History  . Marital Status: Married    Spouse Name: N/A  . Number of Children: N/A  . Years of Education: N/A   Social History Main Topics  . Smoking status: Current Every Day Smoker -- 1.00 packs/day    Types: Cigarettes  . Smokeless tobacco: Never Used  . Alcohol Use: No  . Drug Use: No  . Sexual Activity:    Partners: Male   Other Topics Concern  . None   Social History Narrative   Review of Systems - See HPI.  All other ROS are negative.  BP 158/98 mmHg  Pulse 83  Temp(Src) 98.4 F (36.9 C) (Oral)  Resp 16  Ht 5' 1.75" (1.568 m)  Wt 165 lb (74.844 kg)  BMI 30.44 kg/m2  SpO2 100%  LMP 06/06/2014  Physical Exam  Constitutional: She is oriented to person, place, and time and well-developed, well-nourished, and in no distress.  HENT:  Head: Normocephalic and atraumatic.  Cardiovascular: Normal rate, regular  rhythm, normal heart sounds and intact distal pulses.   Pulmonary/Chest: Effort normal and breath sounds normal. No respiratory distress. She has no wheezes. She has no rales. She exhibits no tenderness.  Musculoskeletal:       Right knee: She exhibits swelling. She exhibits normal range of motion. Tenderness found.  Neurological: She is alert and oriented to person, place, and time.  Skin: Skin is warm and dry. No rash noted.  Psychiatric: Affect normal.  Vitals reviewed.  Assessment/Plan: Recurrent knee pain Now with endorse prior history of gout, concern for gout flareup due to hydrochlorothiazide. Will DC hydrochlorothiazide and start another antihypertensive medication. Dietary measures discussed with patient. Continue RICE and mobic for acute flare-up.    Essential hypertension, benign We'll stop hydrochlorothiazide due to history of gout. We'll begin amlodipine 5 mg daily. DASH diet discussed with patient. Handout given. Follow-up in one month for hypertension and for complete physical exam fasting labs.

## 2014-06-24 NOTE — Patient Instructions (Signed)
Please stop the Hydrochlorothiazide. Begin the Amlodipine daily as directed. Limit salt intake. Limit red meat and alcohol intake.  I think the previous medication was causing an flare-up of gout. Stopping the medication will hopefully help resolve.

## 2014-06-24 NOTE — Assessment & Plan Note (Signed)
We'll stop hydrochlorothiazide due to history of gout. We'll begin amlodipine 5 mg daily. DASH diet discussed with patient. Handout given. Follow-up in one month for hypertension and for complete physical exam fasting labs.

## 2014-06-25 ENCOUNTER — Telehealth: Payer: Self-pay | Admitting: Physician Assistant

## 2014-06-25 NOTE — Telephone Encounter (Signed)
emmi emailed °

## 2014-07-02 ENCOUNTER — Telehealth: Payer: Self-pay | Admitting: Physician Assistant

## 2014-07-02 NOTE — Telephone Encounter (Signed)
Pre Visit letter sent  °

## 2014-07-19 ENCOUNTER — Ambulatory Visit (INDEPENDENT_AMBULATORY_CARE_PROVIDER_SITE_OTHER): Payer: BLUE CROSS/BLUE SHIELD

## 2014-07-19 ENCOUNTER — Ambulatory Visit (INDEPENDENT_AMBULATORY_CARE_PROVIDER_SITE_OTHER): Payer: BLUE CROSS/BLUE SHIELD | Admitting: Family Medicine

## 2014-07-19 VITALS — BP 148/80 | HR 91 | Temp 98.0°F | Resp 18 | Ht 62.0 in | Wt 167.0 lb

## 2014-07-19 DIAGNOSIS — M79642 Pain in left hand: Secondary | ICD-10-CM

## 2014-07-19 DIAGNOSIS — T148XXA Other injury of unspecified body region, initial encounter: Secondary | ICD-10-CM

## 2014-07-19 DIAGNOSIS — M1A9XX1 Chronic gout, unspecified, with tophus (tophi): Secondary | ICD-10-CM

## 2014-07-19 DIAGNOSIS — M25532 Pain in left wrist: Secondary | ICD-10-CM | POA: Diagnosis not present

## 2014-07-19 MED ORDER — METHOCARBAMOL 500 MG PO TABS
500.0000 mg | ORAL_TABLET | Freq: Three times a day (TID) | ORAL | Status: DC | PRN
Start: 2014-07-19 — End: 2014-07-30

## 2014-07-19 MED ORDER — OXYCODONE-ACETAMINOPHEN 5-325 MG PO TABS: 1.0000 | ORAL_TABLET | Freq: Three times a day (TID) | ORAL | Status: DC | PRN

## 2014-07-19 NOTE — Progress Notes (Signed)
Chief Complaint:  Chief Complaint  Patient presents with  . Hand Injury    injured lt hand yesterday   . Hand Pain    HPI: Emma Stephens is a 44 y.o. female who is here for  One day history Left hand and wrist pain after falling on her hand when she was going after a lizard with a broom and fell. She fell on her thumb and it was bent and now has pain, has swelling. She has tried ice and elevation. Neg N/T. History of gout. It is moderate to severe pain. She cannot move her fingers as she likes. She works that she eats. She is left-handed. No prior trauma.  Past Medical History  Diagnosis Date  . Hypertension   . Gout 2011   Past Surgical History  Procedure Laterality Date  . Neck surgery    . Tubal ligation     History   Social History  . Marital Status: Married    Spouse Name: N/A  . Number of Children: N/A  . Years of Education: N/A   Social History Main Topics  . Smoking status: Current Every Day Smoker -- 1.00 packs/day    Types: Cigarettes  . Smokeless tobacco: Never Used  . Alcohol Use: No  . Drug Use: No  . Sexual Activity:    Partners: Male   Other Topics Concern  . None   Social History Narrative   Family History  Problem Relation Age of Onset  . Diabetes Mother   . Hypertension Mother   . Cancer Father 14    oral  . Diabetes Sister   . Hypertension Sister   . Diabetes Brother   . Hypertension Brother    Allergies  Allergen Reactions  . Lisinopril Swelling   Prior to Admission medications   Medication Sig Start Date End Date Taking? Authorizing Provider  amLODipine (NORVASC) 5 MG tablet Take 1 tablet (5 mg total) by mouth daily. 06/24/14  Yes Brunetta Jeans, PA-C  meloxicam (MOBIC) 15 MG tablet Take 1 tablet (15 mg total) by mouth daily. 05/27/14  Yes Brunetta Jeans, PA-C     ROS: The patient denies fevers, chills, night sweats, unintentional weight loss, chest pain, palpitations, wheezing, dyspnea on exertion, nausea, vomiting,  abdominal pain, dysuria, hematuria, melena, numbness, weakness, or tingling.   All other systems have been reviewed and were otherwise negative with the exception of those mentioned in the HPI and as above.    PHYSICAL EXAM: Filed Vitals:   07/19/14 1144  BP: 148/80  Pulse: 91  Temp: 98 F (36.7 C)  Resp: 18   Filed Vitals:   07/19/14 1144  Height: 5\' 2"  (1.575 m)  Weight: 167 lb (75.751 kg)   Body mass index is 30.54 kg/(m^2).  General: Alert, no acute distress HEENT:  Normocephalic, atraumatic, oropharynx patent. EOMI, PERRLA Cardiovascular:  Regular rate and rhythm, no rubs murmurs or gallops.  No Carotid bruits, radial pulse intact. No pedal edema.  Respiratory: Clear to auscultation bilaterally.  No wheezes, rales, or rhonchi.  No cyanosis, no use of accessory musculature GI: No organomegaly, abdomen is soft and non-tender, positive bowel sounds.  No masses. Skin: No rashes. Neurologic: Facial musculature symmetric. Psychiatric: Patient is appropriate throughout our interaction. Lymphatic: No cervical lymphadenopathy Musculoskeletal: Gait intact. Left hand and wrist tender Positive edema, no erythema, no open cuts or wounds. Limited range of motion due to swelling and pain. Radial pulse intact Sensation intact   LABS:  Results for orders placed or performed in visit on 05/24/14  HM MAMMOGRAPHY  Result Value Ref Range   HM Mammogram normal per patient- with Dr. Ulanda Edison   HM PAP SMEAR  Result Value Ref Range   HM Pap smear normal per patient- with Dr. Ulanda Edison      EKG/XRAY:   Primary read interpreted by Dr. Marin Comment at [facility]. No obvious acte fracture, please comment if there is any at base of 1st phalanx Gouty tophi   ASSESSMENT/PLAN: Encounter Diagnoses  Name Primary?  . Left hand pain Yes  . Left wrist pain   . Gout with tophus, unspecified cause, unspecified chronicity, unspecified site   . Sprain and strain    44 year old left-hand-dominant African-American  female with a past medical history of hypertension, gout who presents with hand pain specifically at the base of the first finger She was prescribed Robaxin, Percocet. She also has meloxicam as needed. She continues all of these as needed. I have advised her to call us in 72 hours if she is not getting better. She can follow-up sooner as needed. She was given a sling for comfort. Advised to do R IC E Advised to get her ring off as soon as she is able. I'm hoping once the swelling decreases with elevation and she will have less pain. She can be referred to hand or follow-up for reevaluation in 48 -72 hours. Work note given  Gross sideeffects, risk and benefits, and alternatives of medications d/w patient. Patient is aware that all medications have potential sideeffects and we are unable to predict every sideeffect or drug-drug interaction that may occur.  [name], [place], DO 07/19/2014 6:01 PM

## 2014-07-19 NOTE — Patient Instructions (Signed)

## 2014-07-22 ENCOUNTER — Encounter: Payer: BLUE CROSS/BLUE SHIELD | Admitting: Physician Assistant

## 2014-07-24 ENCOUNTER — Other Ambulatory Visit: Payer: Self-pay | Admitting: Family Medicine

## 2014-07-24 ENCOUNTER — Ambulatory Visit (INDEPENDENT_AMBULATORY_CARE_PROVIDER_SITE_OTHER): Payer: BLUE CROSS/BLUE SHIELD

## 2014-07-24 ENCOUNTER — Telehealth: Payer: Self-pay | Admitting: Family Medicine

## 2014-07-24 ENCOUNTER — Ambulatory Visit (INDEPENDENT_AMBULATORY_CARE_PROVIDER_SITE_OTHER): Payer: BLUE CROSS/BLUE SHIELD | Admitting: Family Medicine

## 2014-07-24 VITALS — BP 122/74 | HR 92 | Temp 98.6°F | Resp 17 | Ht 62.0 in | Wt 166.0 lb

## 2014-07-24 DIAGNOSIS — T148XXA Other injury of unspecified body region, initial encounter: Secondary | ICD-10-CM

## 2014-07-24 DIAGNOSIS — M25532 Pain in left wrist: Secondary | ICD-10-CM

## 2014-07-24 DIAGNOSIS — M79642 Pain in left hand: Secondary | ICD-10-CM

## 2014-07-24 DIAGNOSIS — M1A9XX1 Chronic gout, unspecified, with tophus (tophi): Secondary | ICD-10-CM

## 2014-07-24 DIAGNOSIS — M10032 Idiopathic gout, left wrist: Secondary | ICD-10-CM

## 2014-07-24 DIAGNOSIS — M79645 Pain in left finger(s): Secondary | ICD-10-CM

## 2014-07-24 DIAGNOSIS — M109 Gout, unspecified: Secondary | ICD-10-CM

## 2014-07-24 MED ORDER — OXYCODONE-ACETAMINOPHEN 5-325 MG PO TABS: 1.0000 | ORAL_TABLET | Freq: Three times a day (TID) | ORAL | Status: DC | PRN

## 2014-07-24 MED ORDER — PREDNISONE 20 MG PO TABS: ORAL_TABLET | ORAL | Status: DC

## 2014-07-24 MED ORDER — COLCHICINE 0.6 MG PO TABS: ORAL_TABLET | ORAL | Status: DC

## 2014-07-24 NOTE — Telephone Encounter (Signed)
Patient states that her condition is worsened. She did not say in what way it has become worse. She wants to talk with Dr. Marin Comment.   [phone]

## 2014-07-24 NOTE — Progress Notes (Signed)
Chief Complaint:  Chief Complaint  Patient presents with  . Follow-up    wrist injury     HPI: Lilianna Case is a 44 y.o. female who is here for a recheck of her left wrist and hand pain;  she still has pain with swelling and also unable to move her wrist on the ulnar aspect since her fall. It has been 4 days. She's gone back to work. She has elevated. She has taken Roxicet. The pain is relieved and she is on the medication. She has discomfort and has decreased range of motion when  not on the medicine. She denies any numbness or tingling. Significant swelling. No shortness of breath. She does have a history of gout. However this was triggered after a fall.  Past Medical History  Diagnosis Date  . Hypertension   . Gout 2011   Past Surgical History  Procedure Laterality Date  . Neck surgery    . Tubal ligation     History   Social History  . Marital Status: Married    Spouse Name: N/A  . Number of Children: N/A  . Years of Education: N/A   Social History Main Topics  . Smoking status: Current Every Day Smoker -- 1.00 packs/day for 31 years    Types: Cigarettes  . Smokeless tobacco: Never Used  . Alcohol Use: No  . Drug Use: No  . Sexual Activity:    Partners: Male   Other Topics Concern  . None   Social History Narrative   Family History  Problem Relation Age of Onset  . Diabetes Mother   . Hypertension Mother   . Cancer Father 21    oral  . Diabetes Sister   . Hypertension Sister   . Diabetes Brother   . Hypertension Brother    Allergies  Allergen Reactions  . Lisinopril Swelling   Prior to Admission medications   Medication Sig Start Date End Date Taking? Authorizing Provider  amLODipine (NORVASC) 5 MG tablet Take 1 tablet (5 mg total) by mouth daily. 06/24/14  Yes Brunetta Jeans, PA-C  meloxicam (MOBIC) 15 MG tablet Take 1 tablet (15 mg total) by mouth daily. 05/27/14  Yes Brunetta Jeans, PA-C  methocarbamol (ROBAXIN) 500 MG tablet Take 1  tablet (500 mg total) by mouth every 8 (eight) hours as needed for muscle spasms. 07/19/14  Yes Thao P Le, DO  oxyCODONE-acetaminophen (ROXICET) 5-325 MG per tablet Take 1-2 tablets by mouth every 8 (eight) hours as needed for severe pain. 07/19/14  Yes Thao P Le, DO     ROS: The patient denies fevers, chills, night sweats, unintentional weight loss, chest pain, palpitations, wheezing, dyspnea on exertion, nausea, vomiting, abdominal pain, dysuria, hematuria, melena, numbness, weakness, or tingling.   All other systems have been reviewed and were otherwise negative with the exception of those mentioned in the HPI and as above.    PHYSICAL EXAM: Filed Vitals:   07/24/14 1503  BP: 122/74  Pulse: 92  Temp: 98.6 F (37 C)  Resp: 17   Filed Vitals:   07/24/14 1503  Height: 5\' 2"  (1.575 m)  Weight: 166 lb (75.297 kg)   Body mass index is 30.35 kg/(m^2).  General: Alert, no acute distress HEENT:  Normocephalic, atraumatic, oropharynx patent. EOMI, PERRLA Cardiovascular:  Regular rate and rhythm, no rubs murmurs or gallops.  No Carotid bruits, radial pulse intact. No pedal edema.  Respiratory: Clear to auscultation bilaterally.  No wheezes, rales, or  rhonchi.  No cyanosis, no use of accessory musculature GI: No organomegaly, abdomen is soft and non-tender, positive bowel sounds.  No masses. Skin: No rashes. Neurologic: Facial musculature symmetric. Psychiatric: Patient is appropriate throughout our interaction. Lymphatic: No cervical lymphadenopathy Musculoskeletal: Gait intact. Left hand-minimal swelling, warmth, pain with range of motion. She has tenderness and pain at the distal ulna on palpation and range of motion Sensation intact, grip strength 5 out of 5    LABS: Results for orders placed or performed in visit on 05/24/14  HM MAMMOGRAPHY  Result Value Ref Range   HM Mammogram normal per patient- with Dr. Ulanda Edison   HM PAP SMEAR  Result Value Ref Range   HM Pap smear normal  per patient- with Dr. Ulanda Edison      EKG/XRAY:   Primary read interpreted by Dr. Marin Comment at [facility]. Please comment if there is a distal ulnar fracture?   ASSESSMENT/PLAN: Encounter Diagnoses  Name Primary?  . Left wrist pain Yes  . Left hand pain   . Thumb pain, left    Refill on Roxicet, continue with RIC E, sling prn  We will try a trial of prednisone and also colchicine to see if help with a secondary inflammation with trauma resulting in activating gout , she was advised to stop taking meloxicam while she is taking the prednisone She declined any blood work. Follow-up in 48 hours by phone as needed for referral to hand ortho  Todays x-rays still do not show any fractures. These official x-ray results were discussed with patient, we did go over what [place] is and also etiology of it.   Gross sideeffects, risk and benefits, and alternatives of medications d/w patient. Patient is aware that all medications have potential sideeffects and we are unable to predict every sideeffect or drug-drug interaction that may occur.  LE, [place], DO 07/24/2014 3:20 PM

## 2014-07-24 NOTE — Telephone Encounter (Signed)
Spoke with pt. Her wrist/hand is "twisted to the side". She says this is the way it hangs. She says she was dx with gout in her thumb. We also did xrays of her wrist, but nothing was seen. Now pt states she is worse. Please advise.

## 2014-07-29 ENCOUNTER — Telehealth: Payer: Self-pay | Admitting: *Deleted

## 2014-07-29 ENCOUNTER — Encounter: Payer: Self-pay | Admitting: *Deleted

## 2014-07-29 NOTE — Telephone Encounter (Signed)
Pre-Visit Call completed with patient and chart updated.   Pre-Visit Info documented in Specialty Comments under [employer].    

## 2014-07-30 ENCOUNTER — Encounter: Payer: Self-pay | Admitting: Physician Assistant

## 2014-07-30 ENCOUNTER — Ambulatory Visit (INDEPENDENT_AMBULATORY_CARE_PROVIDER_SITE_OTHER): Payer: BLUE CROSS/BLUE SHIELD | Admitting: Physician Assistant

## 2014-07-30 VITALS — BP 150/95 | HR 79 | Temp 98.0°F | Resp 14 | Ht 62.0 in | Wt 164.0 lb

## 2014-07-30 DIAGNOSIS — I10 Essential (primary) hypertension: Secondary | ICD-10-CM

## 2014-07-30 DIAGNOSIS — M109 Gout, unspecified: Secondary | ICD-10-CM | POA: Insufficient documentation

## 2014-07-30 DIAGNOSIS — Z0000 Encounter for general adult medical examination without abnormal findings: Secondary | ICD-10-CM | POA: Insufficient documentation

## 2014-07-30 HISTORY — DX: Gout, unspecified: M10.9

## 2014-07-30 LAB — URINALYSIS, ROUTINE W REFLEX MICROSCOPIC
Bilirubin Urine: NEGATIVE
Hgb urine dipstick: NEGATIVE
Ketones, ur: NEGATIVE
LEUKOCYTES UA: NEGATIVE
NITRITE: NEGATIVE
RBC / HPF: NONE SEEN (ref 0–?)
Total Protein, Urine: NEGATIVE
Urine Glucose: NEGATIVE
Urobilinogen, UA: 0.2 (ref 0.0–1.0)
pH: 6 (ref 5.0–8.0)

## 2014-07-30 LAB — CBC
HCT: 36.3 % (ref 36.0–46.0)
Hemoglobin: 12.2 g/dL (ref 12.0–15.0)
MCHC: 33.7 g/dL (ref 30.0–36.0)
MCV: 82.8 fl (ref 78.0–100.0)
Platelets: 374 10*3/uL (ref 150.0–400.0)
RBC: 4.38 Mil/uL (ref 3.87–5.11)
RDW: 15.5 % (ref 11.5–15.5)
WBC: 6.5 10*3/uL (ref 4.0–10.5)

## 2014-07-30 LAB — BASIC METABOLIC PANEL
BUN: 10 mg/dL (ref 6–23)
CHLORIDE: 106 meq/L (ref 96–112)
CO2: 25 mEq/L (ref 19–32)
Calcium: 9.3 mg/dL (ref 8.4–10.5)
Creatinine, Ser: 0.61 mg/dL (ref 0.40–1.20)
GFR: 136.2 mL/min (ref >60.00)
Glucose, Bld: 91 mg/dL (ref 70–99)
Potassium: 3.7 mEq/L (ref 3.5–5.1)
Sodium: 137 mEq/L (ref 135–145)

## 2014-07-30 LAB — LIPID PANEL
CHOL/HDL RATIO: 4
Cholesterol: 156 mg/dL (ref 0–200)
HDL: 40.6 mg/dL (ref >39.00)
LDL CALC: 104 mg/dL — AB (ref 0–99)
NonHDL: 115.4
Triglycerides: 59 mg/dL (ref 0.0–149.0)
VLDL: 11.8 mg/dL (ref 0.0–40.0)

## 2014-07-30 LAB — HEPATIC FUNCTION PANEL
ALT: 19 U/L (ref 0–35)
AST: 20 U/L (ref 0–37)
Albumin: 4.1 g/dL (ref 3.5–5.2)
Alkaline Phosphatase: 75 U/L (ref 39–117)
BILIRUBIN TOTAL: 0.4 mg/dL (ref 0.2–1.2)
Bilirubin, Direct: 0.1 mg/dL (ref 0.0–0.3)
TOTAL PROTEIN: 7.2 g/dL (ref 6.0–8.3)

## 2014-07-30 LAB — HEMOGLOBIN A1C: HEMOGLOBIN A1C: 5.8 % (ref 4.6–6.5)

## 2014-07-30 LAB — URIC ACID: Uric Acid, Serum: 4.8 mg/dL (ref 2.4–7.0)

## 2014-07-30 NOTE — Progress Notes (Signed)
Patient presents to clinic today for annual exam.  Patient is fasting for labs.  Acute Concerns: Patient seen at urgent care 1 week ago for pain in L hand.  Went to Urgent care and had x-rays performed.  Was told she had nodules in the finger and was given pain medications. Patient endorses taking Percocet as directed with great of pain.  Denies any recurrence or pain but notes still with some soreness. Patient endorses history of gout but has rare flare-ups. Patient has x-ray disc for review.  Chronic Issues: Hypertension -- Endorses well controlled.  Has not taken medication this morning. Patient denies chest pain, palpitations, lightheadedness, dizziness, vision changes or frequent headaches.  Health Maintenance: Dental -- up-to-date Vision -- up-to-date Immunizations -- up-to-date on required immunization. Mammogram --  Followed by OB/GYN; up-to-date.  Denies abnormal mammogram PAP -- up-to-date.  Followed by OB/GYN.  Past Medical History  Diagnosis Date  . Hypertension   . Gout 2011    Past Surgical History  Procedure Laterality Date  . Neck surgery    . Tubal ligation      Current Outpatient Prescriptions on File Prior to Visit  Medication Sig Dispense Refill  . amLODipine (NORVASC) 5 MG tablet Take 1 tablet (5 mg total) by mouth daily. 30 tablet 3  . meloxicam (MOBIC) 15 MG tablet Take 1 tablet (15 mg total) by mouth daily. 30 tablet 0   No current facility-administered medications on file prior to visit.    Allergies  Allergen Reactions  . Lisinopril Swelling    Family History  Problem Relation Age of Onset  . Diabetes Mother   . Hypertension Mother   . Cancer Father 40    oral  . Diabetes Sister   . Hypertension Sister   . Diabetes Brother   . Hypertension Brother     History   Social History  . Marital Status: Married    Spouse Name: N/A  . Number of Children: N/A  . Years of Education: N/A   Occupational History  . Not on file.   Social  History Main Topics  . Smoking status: Current Every Day Smoker -- 1.00 packs/day for 31 years    Types: Cigarettes  . Smokeless tobacco: Never Used  . Alcohol Use: No  . Drug Use: No  . Sexual Activity:    Partners: Male   Other Topics Concern  . Not on file   Social History Narrative   Review of Systems  Constitutional: Negative for fever and weight loss.  HENT: Negative for ear discharge, ear pain, hearing loss and tinnitus.   Eyes: Negative for blurred vision, double vision, photophobia and pain.  Respiratory: Negative for cough and shortness of breath.   Cardiovascular: Negative for chest pain and palpitations.  Gastrointestinal: Negative for heartburn, nausea, vomiting, abdominal pain, diarrhea, constipation, blood in stool and melena.  Genitourinary: Negative for dysuria, urgency, frequency, hematuria and flank pain.  Musculoskeletal: Positive for joint pain. Negative for falls.  Neurological: Negative for dizziness, loss of consciousness and headaches.  Endo/Heme/Allergies: Negative for environmental allergies.  Psychiatric/Behavioral: Negative for depression, suicidal ideas, hallucinations and substance abuse. The patient is not nervous/anxious and does not have insomnia.    LMP 06/27/2014  Physical Exam  Constitutional: She is oriented to person, place, and time and well-developed, well-nourished, and in no distress.  HENT:  Head: Normocephalic and atraumatic.  Right Ear: External ear normal.  Left Ear: External ear normal.  Nose: Nose normal.  Mouth/Throat: Oropharynx  is clear and moist. No oropharyngeal exudate.  TM within normal limits bilaterally.  Eyes: Conjunctivae are normal. Pupils are equal, round, and reactive to light.  Neck: Neck supple. No thyromegaly present.  Cardiovascular: Normal rate, regular rhythm, normal heart sounds and intact distal pulses.   Pulmonary/Chest: Effort normal and breath sounds normal. No respiratory distress. She has no wheezes.  She has no rales. She exhibits no tenderness.  Abdominal: Soft. Bowel sounds are normal. She exhibits no distension and no mass. There is no tenderness. There is no rebound and no guarding.  Musculoskeletal: Normal range of motion.  Lymphadenopathy:    She has no cervical adenopathy.  Neurological: She is alert and oriented to person, place, and time.  Skin: Skin is warm and dry. No rash noted.  Psychiatric: Affect normal.  Vitals reviewed.  Assessment/Plan: Visit for preventive health examination Depression screen negative. Health Maintenance reviewed. Patient up-to-date on immunizations, mammogram and PAP.  Is average risk for CRC so will start colonoscopy at age 67. Preventive care discussed.  Handout given in AVS.  Will obtain fasting labs today to include cbc, LFT, A1C, UA and Lipid panel.   Essential hypertension, benign Asymptomatic.  Patient has not taken med yet today due to fasting for labs. Discussed it is acceptable and preferred for patients to take all medications as directed, even when fasting for labs.  Will check BMP today. Continue current regimen.   Acute gout Resolved.  Will check uric acid level today. Reviewed x-rays of left hand and wrist obtained by Urgent Care, mild arthritis changes of DIP of left thumb noted. No other abnormality. Supportive and dietary measures discussed with patient.

## 2014-07-30 NOTE — Assessment & Plan Note (Signed)
Resolved.  Will check uric acid level today. Reviewed x-rays of left hand and wrist obtained by Urgent Care, mild arthritis changes of DIP of left thumb noted. No other abnormality. Supportive and dietary measures discussed with patient.

## 2014-07-30 NOTE — Assessment & Plan Note (Signed)
Asymptomatic.  Patient has not taken med yet today due to fasting for labs. Discussed it is acceptable and preferred for patients to take all medications as directed, even when fasting for labs.  Will check BMP today. Continue current regimen.

## 2014-07-30 NOTE — Assessment & Plan Note (Signed)
Depression screen negative. Health Maintenance reviewed. Patient up-to-date on immunizations, mammogram and PAP.  Is average risk for CRC so will start colonoscopy at age 44. Preventive care discussed.  Handout given in AVS.  Will obtain fasting labs today to include cbc, LFT, A1C, UA and Lipid panel.

## 2014-07-30 NOTE — Patient Instructions (Signed)
Please go to the lab for blood work. I will call you with your results. Please continue medications as directed.  Follow-up will be based on your results.  Preventive Care for Adults A healthy lifestyle and preventive care can promote health and wellness. Preventive health guidelines for women include the following key practices.  A routine yearly physical is a good way to check with your health care provider about your health and preventive screening. It is a chance to share any concerns and updates on your health and to receive a thorough exam.  Visit your dentist for a routine exam and preventive care every 6 months. Brush your teeth twice a day and floss once a day. Good oral hygiene prevents tooth decay and gum disease.  The frequency of eye exams is based on your age, health, family medical history, use of contact lenses, and other factors. Follow your health care provider's recommendations for frequency of eye exams.  Eat a healthy diet. Foods like vegetables, fruits, whole grains, low-fat dairy products, and lean protein foods contain the nutrients you need without too many calories. Decrease your intake of foods high in solid fats, added sugars, and salt. Eat the right amount of calories for you.Get information about a proper diet from your health care provider, if necessary.  Regular physical exercise is one of the most important things you can do for your health. Most adults should get at least 150 minutes of moderate-intensity exercise (any activity that increases your heart rate and causes you to sweat) each week. In addition, most adults need muscle-strengthening exercises on 2 or more days a week.  Maintain a healthy weight. The body mass index (BMI) is a screening tool to identify possible weight problems. It provides an estimate of body fat based on height and weight. Your health care provider can find your BMI and can help you achieve or maintain a healthy weight.For adults 20  years and older:  A BMI below 18.5 is considered underweight.  A BMI of 18.5 to 24.9 is normal.  A BMI of 25 to 29.9 is considered overweight.  A BMI of 30 and above is considered obese.  Maintain normal blood lipids and cholesterol levels by exercising and minimizing your intake of saturated fat. Eat a balanced diet with plenty of fruit and vegetables. Blood tests for lipids and cholesterol should begin at age 83 and be repeated every 5 years. If your lipid or cholesterol levels are high, you are over 50, or you are at high risk for heart disease, you may need your cholesterol levels checked more frequently.Ongoing high lipid and cholesterol levels should be treated with medicines if diet and exercise are not working.  If you smoke, find out from your health care provider how to quit. If you do not use tobacco, do not start.  Lung cancer screening is recommended for adults aged 61-80 years who are at high risk for developing lung cancer because of a history of smoking. A yearly low-dose CT scan of the lungs is recommended for people who have at least a 30-pack-year history of smoking and are a current smoker or have quit within the past 15 years. A pack year of smoking is smoking an average of 1 pack of cigarettes a day for 1 year (for example: 1 pack a day for 30 years or 2 packs a day for 15 years). Yearly screening should continue until the smoker has stopped smoking for at least 15 years. Yearly screening should be stopped  for people who develop a health problem that would prevent them from having lung cancer treatment.  If you are pregnant, do not drink alcohol. If you are breastfeeding, be very cautious about drinking alcohol. If you are not pregnant and choose to drink alcohol, do not have more than 1 drink per day. One drink is considered to be 12 ounces (355 mL) of beer, 5 ounces (148 mL) of wine, or 1.5 ounces (44 mL) of liquor.  Avoid use of street drugs. Do not share needles with  anyone. Ask for help if you need support or instructions about stopping the use of drugs.  High blood pressure causes heart disease and increases the risk of stroke. Your blood pressure should be checked at least every 1 to 2 years. Ongoing high blood pressure should be treated with medicines if weight loss and exercise do not work.  If you are 61-48 years old, ask your health care provider if you should take aspirin to prevent strokes.  Diabetes screening involves taking a blood sample to check your fasting blood sugar level. This should be done once every 3 years, after age 32, if you are within normal weight and without risk factors for diabetes. Testing should be considered at a younger age or be carried out more frequently if you are overweight and have at least 1 risk factor for diabetes.  Breast cancer screening is essential preventive care for women. You should practice "breast self-awareness." This means understanding the normal appearance and feel of your breasts and may include breast self-examination. Any changes detected, no matter how small, should be reported to a health care provider. Women in their 2s and 30s should have a clinical breast exam (CBE) by a health care provider as part of a regular health exam every 1 to 3 years. After age 93, women should have a CBE every year. Starting at age 63, women should consider having a mammogram (breast X-ray test) every year. Women who have a family history of breast cancer should talk to their health care provider about genetic screening. Women at a high risk of breast cancer should talk to their health care providers about having an MRI and a mammogram every year.  Breast cancer gene (BRCA)-related cancer risk assessment is recommended for women who have family members with BRCA-related cancers. BRCA-related cancers include breast, ovarian, tubal, and peritoneal cancers. Having family members with these cancers may be associated with an  increased risk for harmful changes (mutations) in the breast cancer genes BRCA1 and BRCA2. Results of the assessment will determine the need for genetic counseling and BRCA1 and BRCA2 testing.  Routine pelvic exams to screen for cancer are no longer recommended for nonpregnant women who are considered low risk for cancer of the pelvic organs (ovaries, uterus, and vagina) and who do not have symptoms. Ask your health care provider if a screening pelvic exam is right for you.  If you have had past treatment for cervical cancer or a condition that could lead to cancer, you need Pap tests and screening for cancer for at least 20 years after your treatment. If Pap tests have been discontinued, your risk factors (such as having a new sexual partner) need to be reassessed to determine if screening should be resumed. Some women have medical problems that increase the chance of getting cervical cancer. In these cases, your health care provider may recommend more frequent screening and Pap tests.  The HPV test is an additional test that may be used  for cervical cancer screening. The HPV test looks for the virus that can cause the cell changes on the cervix. The cells collected during the Pap test can be tested for HPV. The HPV test could be used to screen women aged 54 years and older, and should be used in women of any age who have unclear Pap test results. After the age of 67, women should have HPV testing at the same frequency as a Pap test.  Colorectal cancer can be detected and often prevented. Most routine colorectal cancer screening begins at the age of 61 years and continues through age 59 years. However, your health care provider may recommend screening at an earlier age if you have risk factors for colon cancer. On a yearly basis, your health care provider may provide home test kits to check for hidden blood in the stool. Use of a small camera at the end of a tube, to directly examine the colon  (sigmoidoscopy or colonoscopy), can detect the earliest forms of colorectal cancer. Talk to your health care provider about this at age 66, when routine screening begins. Direct exam of the colon should be repeated every 5-10 years through age 35 years, unless early forms of pre-cancerous polyps or small growths are found.  People who are at an increased risk for hepatitis B should be screened for this virus. You are considered at high risk for hepatitis B if:  You were born in a country where hepatitis B occurs often. Talk with your health care provider about which countries are considered high risk.  Your parents were born in a high-risk country and you have not received a shot to protect against hepatitis B (hepatitis B vaccine).  You have HIV or AIDS.  You use needles to inject street drugs.  You live with, or have sex with, someone who has hepatitis B.  You get hemodialysis treatment.  You take certain medicines for conditions like cancer, organ transplantation, and autoimmune conditions.  Hepatitis C blood testing is recommended for all people born from [birth year] through 1965 and any individual with known risks for hepatitis C.  Practice safe sex. Use condoms and avoid high-risk sexual practices to reduce the spread of sexually transmitted infections (STIs). STIs include gonorrhea, chlamydia, syphilis, trichomonas, herpes, HPV, and human immunodeficiency virus (HIV). Herpes, HIV, and HPV are viral illnesses that have no cure. They can result in disability, cancer, and death.  You should be screened for sexually transmitted illnesses (STIs) including gonorrhea and chlamydia if:  You are sexually active and are younger than 24 years.  You are older than 24 years and your health care provider tells you that you are at risk for this type of infection.  Your sexual activity has changed since you were last screened and you are at an increased risk for chlamydia or gonorrhea. Ask your health  care provider if you are at risk.  If you are at risk of being infected with HIV, it is recommended that you take a prescription medicine daily to prevent HIV infection. This is called preexposure prophylaxis (PrEP). You are considered at risk if:  You are a heterosexual woman, are sexually active, and are at increased risk for HIV infection.  You take drugs by injection.  You are sexually active with a partner who has HIV.  Talk with your health care provider about whether you are at high risk of being infected with HIV. If you choose to begin PrEP, you should first be tested for HIV.  You should then be tested every 3 months for as long as you are taking PrEP.  Osteoporosis is a disease in which the bones lose minerals and strength with aging. This can result in serious bone fractures or breaks. The risk of osteoporosis can be identified using a bone density scan. Women ages 63 years and over and women at risk for fractures or osteoporosis should discuss screening with their health care providers. Ask your health care provider whether you should take a calcium supplement or vitamin D to reduce the rate of osteoporosis.  Menopause can be associated with physical symptoms and risks. Hormone replacement therapy is available to decrease symptoms and risks. You should talk to your health care provider about whether hormone replacement therapy is right for you.  Use sunscreen. Apply sunscreen liberally and repeatedly throughout the day. You should seek shade when your shadow is shorter than you. Protect yourself by wearing long sleeves, pants, a wide-brimmed hat, and sunglasses year round, whenever you are outdoors.  Once a month, do a whole body skin exam, using a mirror to look at the skin on your back. Tell your health care provider of new moles, moles that have irregular borders, moles that are larger than a pencil eraser, or moles that have changed in shape or color.  Stay current with required  vaccines (immunizations).  Influenza vaccine. All adults should be immunized every year.  Tetanus, diphtheria, and acellular pertussis (Td, Tdap) vaccine. Pregnant women should receive 1 dose of Tdap vaccine during each pregnancy. The dose should be obtained regardless of the length of time since the last dose. Immunization is preferred during the 27th-36th week of gestation. An adult who has not previously received Tdap or who does not know her vaccine status should receive 1 dose of Tdap. This initial dose should be followed by tetanus and diphtheria toxoids (Td) booster doses every 10 years. Adults with an unknown or incomplete history of completing a 3-dose immunization series with Td-containing vaccines should begin or complete a primary immunization series including a Tdap dose. Adults should receive a Td booster every 10 years.  Varicella vaccine. An adult without evidence of immunity to varicella should receive 2 doses or a second dose if she has previously received 1 dose. Pregnant females who do not have evidence of immunity should receive the first dose after pregnancy. This first dose should be obtained before leaving the health care facility. The second dose should be obtained 4-8 weeks after the first dose.  Human papillomavirus (HPV) vaccine. Females aged 13-26 years who have not received the vaccine previously should obtain the 3-dose series. The vaccine is not recommended for use in pregnant females. However, pregnancy testing is not needed before receiving a dose. If a female is found to be pregnant after receiving a dose, no treatment is needed. In that case, the remaining doses should be delayed until after the pregnancy. Immunization is recommended for any person with an immunocompromised condition through the age of 80 years if she did not get any or all doses earlier. During the 3-dose series, the second dose should be obtained 4-8 weeks after the first dose. The third dose should be  obtained 24 weeks after the first dose and 16 weeks after the second dose.  Zoster vaccine. One dose is recommended for adults aged 63 years or older unless certain conditions are present.  Measles, mumps, and rubella (MMR) vaccine. Adults born before [birth year] generally are considered immune to measles and mumps. Adults born  in 1957 or later should have 1 or more doses of MMR vaccine unless there is a contraindication to the vaccine or there is laboratory evidence of immunity to each of the three diseases. A routine second dose of MMR vaccine should be obtained at least 28 days after the first dose for students attending postsecondary schools, health care workers, or international travelers. People who received inactivated measles vaccine or an unknown type of measles vaccine during 1963-1967 should receive 2 doses of MMR vaccine. People who received inactivated mumps vaccine or an unknown type of mumps vaccine before 1979 and are at high risk for mumps infection should consider immunization with 2 doses of MMR vaccine. For females of childbearing age, rubella immunity should be determined. If there is no evidence of immunity, females who are not pregnant should be vaccinated. If there is no evidence of immunity, females who are pregnant should delay immunization until after pregnancy. Unvaccinated health care workers born before [birth year] who lack laboratory evidence of measles, mumps, or rubella immunity or laboratory confirmation of disease should consider measles and mumps immunization with 2 doses of MMR vaccine or rubella immunization with 1 dose of MMR vaccine.  Pneumococcal 13-valent conjugate (PCV13) vaccine. When indicated, a person who is uncertain of her immunization history and has no record of immunization should receive the PCV13 vaccine. An adult aged 107 years or older who has certain medical conditions and has not been previously immunized should receive 1 dose of PCV13 vaccine. This PCV13 should be  followed with a dose of pneumococcal polysaccharide (PPSV23) vaccine. The PPSV23 vaccine dose should be obtained at least 8 weeks after the dose of PCV13 vaccine. An adult aged 46 years or older who has certain medical conditions and previously received 1 or more doses of PPSV23 vaccine should receive 1 dose of PCV13. The PCV13 vaccine dose should be obtained 1 or more years after the last PPSV23 vaccine dose.  Pneumococcal polysaccharide (PPSV23) vaccine. When PCV13 is also indicated, PCV13 should be obtained first. All adults aged 59 years and older should be immunized. An adult younger than age 26 years who has certain medical conditions should be immunized. Any person who resides in a nursing home or long-term care facility should be immunized. An adult smoker should be immunized. People with an immunocompromised condition and certain other conditions should receive both PCV13 and PPSV23 vaccines. People with human immunodeficiency virus (HIV) infection should be immunized as soon as possible after diagnosis. Immunization during chemotherapy or radiation therapy should be avoided. Routine use of PPSV23 vaccine is not recommended for American Indians, [place] Natives, or people younger than 65 years unless there are medical conditions that require PPSV23 vaccine. When indicated, people who have unknown immunization and have no record of immunization should receive PPSV23 vaccine. One-time revaccination 5 years after the first dose of PPSV23 is recommended for people aged 19-64 years who have chronic kidney failure, nephrotic syndrome, asplenia, or immunocompromised conditions. People who received 1-2 doses of PPSV23 before age 68 years should receive another dose of PPSV23 vaccine at age 47 years or later if at least 5 years have passed since the previous dose. Doses of PPSV23 are not needed for people immunized with PPSV23 at or after age 73 years.  Meningococcal vaccine. Adults with asplenia or persistent  complement component deficiencies should receive 2 doses of quadrivalent meningococcal conjugate (MenACWY-D) vaccine. The doses should be obtained at least 2 months apart. Microbiologists working with certain meningococcal bacteria, [employer] recruits, people at  risk during an outbreak, and people who travel to or live in countries with a high rate of meningitis should be immunized. A first-year college student up through age 41 years who is living in a residence hall should receive a dose if she did not receive a dose on or after her 16th birthday. Adults who have certain high-risk conditions should receive one or more doses of vaccine.  Hepatitis A vaccine. Adults who wish to be protected from this disease, have certain high-risk conditions, work with hepatitis A-infected animals, work in hepatitis A research labs, or travel to or work in countries with a high rate of hepatitis A should be immunized. Adults who were previously unvaccinated and who anticipate close contact with an international adoptee during the first 60 days after arrival in the [place] from a country with a high rate of hepatitis A should be immunized.  Hepatitis B vaccine. Adults who wish to be protected from this disease, have certain high-risk conditions, may be exposed to blood or other infectious body fluids, are household contacts or sex partners of hepatitis B positive people, are clients or workers in certain care facilities, or travel to or work in countries with a high rate of hepatitis B should be immunized.  Haemophilus influenzae type b (Hib) vaccine. A previously unvaccinated person with asplenia or sickle cell disease or having a scheduled splenectomy should receive 1 dose of Hib vaccine. Regardless of previous immunization, a recipient of a hematopoietic stem cell transplant should receive a 3-dose series 6-12 months after her successful transplant. Hib vaccine is not recommended for adults with HIV  infection. Preventive Services / Frequency Ages 56 to 20 years  Blood pressure check.** / Every 1 to 2 years.  Lipid and cholesterol check.** / Every 5 years beginning at age 6.  Clinical breast exam.** / Every 3 years for women in their 74s and 27s.  BRCA-related cancer risk assessment.** / For women who have family members with a BRCA-related cancer (breast, ovarian, tubal, or peritoneal cancers).  Pap test.** / Every 2 years from ages 103 through 35. Every 3 years starting at age 62 through age 87 or 59 with a history of 3 consecutive normal Pap tests.  HPV screening.** / Every 3 years from ages 54 through ages 47 to 76 with a history of 3 consecutive normal Pap tests.  Hepatitis C blood test.** / For any individual with known risks for hepatitis C.  Skin self-exam. / Monthly.  Influenza vaccine. / Every year.  Tetanus, diphtheria, and acellular pertussis (Tdap, Td) vaccine.** / Consult your health care provider. Pregnant women should receive 1 dose of Tdap vaccine during each pregnancy. 1 dose of Td every 10 years.  Varicella vaccine.** / Consult your health care provider. Pregnant females who do not have evidence of immunity should receive the first dose after pregnancy.  HPV vaccine. / 3 doses over 6 months, if 57 and younger. The vaccine is not recommended for use in pregnant females. However, pregnancy testing is not needed before receiving a dose.  Measles, mumps, rubella (MMR) vaccine.** / You need at least 1 dose of MMR if you were born in 1957 or later. You may also need a 2nd dose. For females of childbearing age, rubella immunity should be determined. If there is no evidence of immunity, females who are not pregnant should be vaccinated. If there is no evidence of immunity, females who are pregnant should delay immunization until after pregnancy.  Pneumococcal 13-valent conjugate (PCV13) vaccine.** /  Consult your health care provider.  Pneumococcal polysaccharide  (PPSV23) vaccine.** / 1 to 2 doses if you smoke cigarettes or if you have certain conditions.  Meningococcal vaccine.** / 1 dose if you are age 85 to 57 years and a Market researcher living in a residence hall, or have one of several medical conditions, you need to get vaccinated against meningococcal disease. You may also need additional booster doses.  Hepatitis A vaccine.** / Consult your health care provider.  Hepatitis B vaccine.** / Consult your health care provider.  Haemophilus influenzae type b (Hib) vaccine.** / Consult your health care provider. Ages 22 to 33 years  Blood pressure check.** / Every 1 to 2 years.  Lipid and cholesterol check.** / Every 5 years beginning at age 47 years.  Lung cancer screening. / Every year if you are aged 27-80 years and have a 30-pack-year history of smoking and currently smoke or have quit within the past 15 years. Yearly screening is stopped once you have quit smoking for at least 15 years or develop a health problem that would prevent you from having lung cancer treatment.  Clinical breast exam.** / Every year after age 60 years.  BRCA-related cancer risk assessment.** / For women who have family members with a BRCA-related cancer (breast, ovarian, tubal, or peritoneal cancers).  Mammogram.** / Every year beginning at age 41 years and continuing for as long as you are in good health. Consult with your health care provider.  Pap test.** / Every 3 years starting at age 24 years through age 71 or 27 years with a history of 3 consecutive normal Pap tests.  HPV screening.** / Every 3 years from ages 4 years through ages 85 to 59 years with a history of 3 consecutive normal Pap tests.  Fecal occult blood test (FOBT) of stool. / Every year beginning at age 13 years and continuing until age 47 years. You may not need to do this test if you get a colonoscopy every 10 years.  Flexible sigmoidoscopy or colonoscopy.** / Every 5 years for a  flexible sigmoidoscopy or every 10 years for a colonoscopy beginning at age 3 years and continuing until age 38 years.  Hepatitis C blood test.** / For all people born from [birth year] through 1966 and any individual with known risks for hepatitis C.  Skin self-exam. / Monthly.  Influenza vaccine. / Every year.  Tetanus, diphtheria, and acellular pertussis (Tdap/Td) vaccine.** / Consult your health care provider. Pregnant women should receive 1 dose of Tdap vaccine during each pregnancy. 1 dose of Td every 10 years.  Varicella vaccine.** / Consult your health care provider. Pregnant females who do not have evidence of immunity should receive the first dose after pregnancy.  Zoster vaccine.** / 1 dose for adults aged 22 years or older.  Measles, mumps, rubella (MMR) vaccine.** / You need at least 1 dose of MMR if you were born in 1957 or later. You may also need a 2nd dose. For females of childbearing age, rubella immunity should be determined. If there is no evidence of immunity, females who are not pregnant should be vaccinated. If there is no evidence of immunity, females who are pregnant should delay immunization until after pregnancy.  Pneumococcal 13-valent conjugate (PCV13) vaccine.** / Consult your health care provider.  Pneumococcal polysaccharide (PPSV23) vaccine.** / 1 to 2 doses if you smoke cigarettes or if you have certain conditions.  Meningococcal vaccine.** / Consult your health care provider.  Hepatitis A vaccine.** / Consult  your health care provider.  Hepatitis B vaccine.** / Consult your health care provider.  Haemophilus influenzae type b (Hib) vaccine.** / Consult your health care provider. Ages 76 years and over  Blood pressure check.** / Every 1 to 2 years.  Lipid and cholesterol check.** / Every 5 years beginning at age 62 years.  Lung cancer screening. / Every year if you are aged 34-80 years and have a 30-pack-year history of smoking and currently smoke or have  quit within the past 15 years. Yearly screening is stopped once you have quit smoking for at least 15 years or develop a health problem that would prevent you from having lung cancer treatment.  Clinical breast exam.** / Every year after age 12 years.  BRCA-related cancer risk assessment.** / For women who have family members with a BRCA-related cancer (breast, ovarian, tubal, or peritoneal cancers).  Mammogram.** / Every year beginning at age 65 years and continuing for as long as you are in good health. Consult with your health care provider.  Pap test.** / Every 3 years starting at age 75 years through age 39 or 25 years with 3 consecutive normal Pap tests. Testing can be stopped between 65 and 70 years with 3 consecutive normal Pap tests and no abnormal Pap or HPV tests in the past 10 years.  HPV screening.** / Every 3 years from ages 12 years through ages 52 or 87 years with a history of 3 consecutive normal Pap tests. Testing can be stopped between 65 and 70 years with 3 consecutive normal Pap tests and no abnormal Pap or HPV tests in the past 10 years.  Fecal occult blood test (FOBT) of stool. / Every year beginning at age 93 years and continuing until age 49 years. You may not need to do this test if you get a colonoscopy every 10 years.  Flexible sigmoidoscopy or colonoscopy.** / Every 5 years for a flexible sigmoidoscopy or every 10 years for a colonoscopy beginning at age 70 years and continuing until age 4 years.  Hepatitis C blood test.** / For all people born from [birth year] through 1966 and any individual with known risks for hepatitis C.  Osteoporosis screening.** / A one-time screening for women ages 10 years and over and women at risk for fractures or osteoporosis.  Skin self-exam. / Monthly.  Influenza vaccine. / Every year.  Tetanus, diphtheria, and acellular pertussis (Tdap/Td) vaccine.** / 1 dose of Td every 10 years.  Varicella vaccine.** / Consult your health care  provider.  Zoster vaccine.** / 1 dose for adults aged 38 years or older.  Pneumococcal 13-valent conjugate (PCV13) vaccine.** / Consult your health care provider.  Pneumococcal polysaccharide (PPSV23) vaccine.** / 1 dose for all adults aged 44 years and older.  Meningococcal vaccine.** / Consult your health care provider.  Hepatitis A vaccine.** / Consult your health care provider.  Hepatitis B vaccine.** / Consult your health care provider.  Haemophilus influenzae type b (Hib) vaccine.** / Consult your health care provider. ** Family history and personal history of risk and conditions may change your health care provider's recommendations. Document Released: 05/04/2001 Document Revised: 07/23/2013 Document Reviewed: 08/03/2010 [facility] Patient Information 2015 [place], [place]. This information is not intended to replace advice given to you by your health care provider. Make sure you discuss any questions you have with your health care provider.

## 2014-07-30 NOTE — Progress Notes (Signed)
Pre visit review using our clinic review tool, if applicable. No additional management support is needed unless otherwise documented below in the visit note. 

## 2014-10-22 ENCOUNTER — Observation Stay (HOSPITAL_COMMUNITY)
Admission: EM | Admit: 2014-10-22 | Discharge: 2014-10-23 | Disposition: A | Discharge status: Home / Self Care | Payer: BLUE CROSS/BLUE SHIELD | Attending: Internal Medicine | Admitting: Internal Medicine

## 2014-10-22 ENCOUNTER — Encounter (HOSPITAL_COMMUNITY): Payer: Self-pay

## 2014-10-22 ENCOUNTER — Other Ambulatory Visit: Payer: Self-pay | Admitting: Physician Assistant

## 2014-10-22 ENCOUNTER — Emergency Department (HOSPITAL_COMMUNITY): Payer: BLUE CROSS/BLUE SHIELD

## 2014-10-22 ENCOUNTER — Observation Stay (HOSPITAL_BASED_OUTPATIENT_CLINIC_OR_DEPARTMENT_OTHER): Payer: BLUE CROSS/BLUE SHIELD

## 2014-10-22 DIAGNOSIS — R42 Dizziness and giddiness: Secondary | ICD-10-CM | POA: Insufficient documentation

## 2014-10-22 DIAGNOSIS — M79602 Pain in left arm: Secondary | ICD-10-CM | POA: Diagnosis not present

## 2014-10-22 DIAGNOSIS — R002 Palpitations: Secondary | ICD-10-CM | POA: Insufficient documentation

## 2014-10-22 DIAGNOSIS — Z888 Allergy status to other drugs, medicaments and biological substances status: Secondary | ICD-10-CM | POA: Diagnosis not present

## 2014-10-22 DIAGNOSIS — F17213 Nicotine dependence, cigarettes, with withdrawal: Secondary | ICD-10-CM | POA: Diagnosis not present

## 2014-10-22 DIAGNOSIS — Z79899 Other long term (current) drug therapy: Secondary | ICD-10-CM | POA: Diagnosis not present

## 2014-10-22 DIAGNOSIS — R0789 Other chest pain: Secondary | ICD-10-CM | POA: Diagnosis not present

## 2014-10-22 DIAGNOSIS — I10 Essential (primary) hypertension: Secondary | ICD-10-CM | POA: Insufficient documentation

## 2014-10-22 DIAGNOSIS — F17210 Nicotine dependence, cigarettes, uncomplicated: Secondary | ICD-10-CM | POA: Insufficient documentation

## 2014-10-22 DIAGNOSIS — R079 Chest pain, unspecified: Secondary | ICD-10-CM | POA: Insufficient documentation

## 2014-10-22 DIAGNOSIS — M109 Gout, unspecified: Secondary | ICD-10-CM | POA: Insufficient documentation

## 2014-10-22 DIAGNOSIS — R110 Nausea: Secondary | ICD-10-CM | POA: Diagnosis not present

## 2014-10-22 DIAGNOSIS — Z87891 Personal history of nicotine dependence: Secondary | ICD-10-CM

## 2014-10-22 DIAGNOSIS — I493 Ventricular premature depolarization: Principal | ICD-10-CM | POA: Insufficient documentation

## 2014-10-22 HISTORY — DX: Nicotine dependence, cigarettes, uncomplicated: F17.210

## 2014-10-22 LAB — PROTIME-INR
INR: 1.03 (ref 0.00–1.49)
PROTHROMBIN TIME: 13.7 s (ref 11.6–15.2)

## 2014-10-22 LAB — CBC WITH DIFFERENTIAL/PLATELET
BASOS PCT: 0 % (ref 0–1)
Basophils Absolute: 0 10*3/uL (ref 0.0–0.1)
EOS ABS: 0.2 10*3/uL (ref 0.0–0.7)
Eosinophils Relative: 2 % (ref 0–5)
HCT: 37 % (ref 36.0–46.0)
HEMOGLOBIN: 12.4 g/dL (ref 12.0–15.0)
LYMPHS PCT: 46 % (ref 12–46)
Lymphs Abs: 3.1 10*3/uL (ref 0.7–4.0)
MCH: 27.9 pg (ref 26.0–34.0)
MCHC: 33.5 g/dL (ref 30.0–36.0)
MCV: 83.1 fL (ref 78.0–100.0)
MONO ABS: 0.4 10*3/uL (ref 0.1–1.0)
MONOS PCT: 6 % (ref 3–12)
NEUTROS PCT: 46 % (ref 43–77)
Neutro Abs: 3.2 10*3/uL (ref 1.7–7.7)
PLATELETS: 386 10*3/uL (ref 150–400)
RBC: 4.45 MIL/uL (ref 3.87–5.11)
RDW: 13.9 % (ref 11.5–15.5)
WBC: 6.8 10*3/uL (ref 4.0–10.5)

## 2014-10-22 LAB — URINALYSIS, ROUTINE W REFLEX MICROSCOPIC
Bilirubin Urine: NEGATIVE
Glucose, UA: NEGATIVE mg/dL
Ketones, ur: NEGATIVE mg/dL
Leukocytes, UA: NEGATIVE
Nitrite: NEGATIVE
Protein, ur: NEGATIVE mg/dL
SPECIFIC GRAVITY, URINE: 1.014 (ref 1.005–1.030)
UROBILINOGEN UA: 0.2 mg/dL (ref 0.0–1.0)
pH: 7 (ref 5.0–8.0)

## 2014-10-22 LAB — COMPREHENSIVE METABOLIC PANEL
ALBUMIN: 4.2 g/dL (ref 3.5–5.0)
ALT: 13 U/L — AB (ref 14–54)
AST: 19 U/L (ref 15–41)
Alkaline Phosphatase: 75 U/L (ref 38–126)
Anion gap: 9 (ref 5–15)
BUN: 10 mg/dL (ref 6–20)
CHLORIDE: 105 mmol/L (ref 101–111)
CO2: 23 mmol/L (ref 22–32)
Calcium: 9.3 mg/dL (ref 8.9–10.3)
Creatinine, Ser: 0.54 mg/dL (ref 0.44–1.00)
GFR calc non Af Amer: >60 mL/min (ref >60)
Glucose, Bld: 96 mg/dL (ref 65–99)
Potassium: 3.6 mmol/L (ref 3.5–5.1)
SODIUM: 137 mmol/L (ref 135–145)
Total Bilirubin: 0.4 mg/dL (ref 0.3–1.2)
Total Protein: 7.6 g/dL (ref 6.5–8.1)

## 2014-10-22 LAB — RAPID URINE DRUG SCREEN, HOSP PERFORMED
AMPHETAMINES: NOT DETECTED
BENZODIAZEPINES: NOT DETECTED
Barbiturates: NOT DETECTED
COCAINE: NOT DETECTED
Opiates: POSITIVE — AB
TETRAHYDROCANNABINOL: NOT DETECTED

## 2014-10-22 LAB — URINE MICROSCOPIC-ADD ON

## 2014-10-22 LAB — MAGNESIUM: MAGNESIUM: 1.8 mg/dL (ref 1.7–2.4)

## 2014-10-22 LAB — I-STAT TROPONIN, ED
TROPONIN I, POC: 0 ng/mL (ref 0.00–0.08)
Troponin i, poc: 0 ng/mL (ref 0.00–0.08)

## 2014-10-22 LAB — TSH: TSH: 0.659 u[IU]/mL (ref 0.350–4.500)

## 2014-10-22 LAB — TROPONIN I: Troponin I: <0.03 ng/mL (ref <0.031)

## 2014-10-22 LAB — MRSA PCR SCREENING: MRSA BY PCR: NEGATIVE

## 2014-10-22 LAB — POC URINE PREG, ED: PREG TEST UR: NEGATIVE

## 2014-10-22 LAB — D-DIMER, QUANTITATIVE: D-Dimer, Quant: <0.27 ug/mL-FEU (ref 0.00–0.48)

## 2014-10-22 MED ORDER — VITAMIN B-12 2500 MCG SL SUBL: 5000.0000 ug | SUBLINGUAL_TABLET | Freq: Every day | SUBLINGUAL | Status: DC

## 2014-10-22 MED ORDER — HYDRALAZINE HCL 20 MG/ML IJ SOLN
10.0000 mg | Freq: Once | INTRAMUSCULAR | Status: AC
  Administered 2014-10-22: 10 mg via INTRAVENOUS
  Filled 2014-10-22: qty 1

## 2014-10-22 MED ORDER — MORPHINE SULFATE 2 MG/ML IJ SOLN
2.0000 mg | Freq: Once | INTRAMUSCULAR | Status: AC
  Administered 2014-10-22: 2 mg via INTRAVENOUS
  Filled 2014-10-22: qty 1

## 2014-10-22 MED ORDER — METOPROLOL TARTRATE 12.5 MG HALF TABLET
12.5000 mg | ORAL_TABLET | Freq: Two times a day (BID) | ORAL | Status: DC
  Administered 2014-10-22 (×2): 12.5 mg via ORAL
  Filled 2014-10-22 (×2): qty 1

## 2014-10-22 MED ORDER — ATORVASTATIN CALCIUM 40 MG PO TABS: 80.0000 mg | ORAL_TABLET | Freq: Every day | ORAL | Status: DC

## 2014-10-22 MED ORDER — VITAMIN B-12 1000 MCG PO TABS
5000.0000 ug | ORAL_TABLET | Freq: Every day | ORAL | Status: DC
  Administered 2014-10-22 – 2014-10-23 (×2): 5000 ug via ORAL
  Filled 2014-10-22 (×3): qty 5

## 2014-10-22 MED ORDER — SODIUM CHLORIDE 0.9 % IV SOLN
1000.0000 mL | Freq: Once | INTRAVENOUS | Status: AC
  Administered 2014-10-22: 1000 mL via INTRAVENOUS

## 2014-10-22 MED ORDER — METOPROLOL TARTRATE 12.5 MG HALF TABLET
12.5000 mg | ORAL_TABLET | Freq: Two times a day (BID) | ORAL | Status: DC
Start: 2014-10-22 — End: 2014-10-22

## 2014-10-22 MED ORDER — ASPIRIN EC 81 MG PO TBEC
81.0000 mg | DELAYED_RELEASE_TABLET | Freq: Every day | ORAL | Status: DC
  Administered 2014-10-23: 81 mg via ORAL
  Filled 2014-10-22: qty 1

## 2014-10-22 MED ORDER — POTASSIUM CHLORIDE CRYS ER 10 MEQ PO TBCR
10.0000 meq | EXTENDED_RELEASE_TABLET | Freq: Every day | ORAL | Status: DC
  Administered 2014-10-23: 10 meq via ORAL
  Filled 2014-10-22: qty 1

## 2014-10-22 MED ORDER — HYDRALAZINE HCL 20 MG/ML IJ SOLN: 10.0000 mg | Freq: Four times a day (QID) | INTRAMUSCULAR | Status: DC | PRN

## 2014-10-22 MED ORDER — MORPHINE SULFATE 4 MG/ML IJ SOLN
2.0000 mg | Freq: Once | INTRAMUSCULAR | Status: AC
  Administered 2014-10-22: 2 mg via INTRAVENOUS
  Filled 2014-10-22: qty 1

## 2014-10-22 MED ORDER — MAGNESIUM OXIDE 400 (241.3 MG) MG PO TABS
200.0000 mg | ORAL_TABLET | Freq: Every day | ORAL | Status: DC
  Administered 2014-10-22 – 2014-10-23 (×2): 200 mg via ORAL
  Filled 2014-10-22 (×2): qty 1

## 2014-10-22 MED ORDER — ENOXAPARIN SODIUM 40 MG/0.4ML ~~LOC~~ SOLN
40.0000 mg | SUBCUTANEOUS | Status: DC
Start: 2014-10-22 — End: 2014-10-23
  Administered 2014-10-22: 40 mg via SUBCUTANEOUS
  Filled 2014-10-22: qty 0.4

## 2014-10-22 MED ORDER — POTASSIUM CHLORIDE CRYS ER 20 MEQ PO TBCR
40.0000 meq | EXTENDED_RELEASE_TABLET | Freq: Once | ORAL | Status: AC
  Administered 2014-10-22: 40 meq via ORAL
  Filled 2014-10-22: qty 2

## 2014-10-22 MED ORDER — NITROGLYCERIN 0.4 MG SL SUBL: 0.4000 mg | SUBLINGUAL_TABLET | SUBLINGUAL | Status: DC | PRN

## 2014-10-22 MED ORDER — ASPIRIN 81 MG PO CHEW
324.0000 mg | CHEWABLE_TABLET | Freq: Once | ORAL | Status: AC
  Administered 2014-10-22: 324 mg via ORAL
  Filled 2014-10-22: qty 4

## 2014-10-22 MED ORDER — NICOTINE 14 MG/24HR TD PT24
14.0000 mg | MEDICATED_PATCH | Freq: Every day | TRANSDERMAL | Status: DC
  Administered 2014-10-22 – 2014-10-23 (×2): 14 mg via TRANSDERMAL
  Filled 2014-10-22 (×2): qty 1

## 2014-10-22 MED ORDER — ACETAMINOPHEN 325 MG PO TABS
650.0000 mg | ORAL_TABLET | ORAL | Status: DC | PRN
  Administered 2014-10-23: 650 mg via ORAL
  Filled 2014-10-22: qty 2

## 2014-10-22 MED ORDER — ONDANSETRON HCL 4 MG/2ML IJ SOLN: 4.0000 mg | Freq: Four times a day (QID) | INTRAMUSCULAR | Status: DC | PRN

## 2014-10-22 MED ORDER — NITROGLYCERIN 2 % TD OINT
1.0000 [in_us] | TOPICAL_OINTMENT | Freq: Once | TRANSDERMAL | Status: AC
  Administered 2014-10-22: 1 [in_us] via TOPICAL
  Filled 2014-10-22: qty 30

## 2014-10-22 MED ORDER — ONDANSETRON HCL 4 MG/2ML IJ SOLN
4.0000 mg | Freq: Once | INTRAMUSCULAR | Status: AC
  Administered 2014-10-22: 4 mg via INTRAVENOUS
  Filled 2014-10-22: qty 2

## 2014-10-22 NOTE — Consult Note (Signed)
CARDIOLOGY CONSULT NOTE   Patient ID: Emma Stephens MRN: [id number] DOB/AGE: July 09, 1970 44 y.o.  Admit date: 10/22/2014  Primary Physician   Leeanne Rio, PA-C Primary Cardiologist   New Reason for Consultation   Symptomatic PVCs  [id number] Rayle is a 44 y.o. year old female with a history of HTN, tob use, gout, but no cardiac issues. She was admitted with symptomatic PVCs and chest pain. Cardiology asked to evaluate.  The flutters started about a month ago. They were initially rare, but progressed in frequency steadily. As they became more frequent, she became more symptomatic. She would feel light-headed, both with position changes and sitting still. These episodes would pass, but sometimes were so bad, they scared her.   Yesterday and today, the palpitations were bad enough that she felt nauseated. Today, she also felt flushed and hot. She would get light-headed to the point she felt presyncope, but did not pass out.  Today, for the first time, she developed L lateral axillary/chest pain, not with exertion. She had been having very frequent flutters today, but does not feel the pain was particularly associated with the arrhythmia. The pain radiated down her arm, it was piercing and a pressure, and was a 5-6/10. She would move her shoulder around and maybe that would help. The pain eventually went away. She is currently lying on her L side for the echo and does feel some L should pressure at this time. She has no history of exertional symptoms. She does not exercise but is active at work and around the house without difficulty.   Past Medical History  Diagnosis Date  . Hypertension   . Gout 2011  . Cigarette nicotine dependence      Past Surgical History  Procedure Laterality Date  . Neck surgery    . Tubal ligation      Allergies  Allergen Reactions  . Lisinopril Hives and Swelling    I have reviewed the patient's current medications . [START ON  10/23/2014] aspirin EC  81 mg Oral Daily  . enoxaparin (LOVENOX) injection  40 mg Subcutaneous Q24H  . metoprolol tartrate  12.5 mg Oral BID  . nicotine  14 mg Transdermal Daily  . vitamin B-12  5,000 mcg Oral Daily     acetaminophen, nitroGLYCERIN, ondansetron (ZOFRAN) IV  Prior to Admission medications   Medication Sig Start Date End Date Taking? Authorizing Provider  amLODipine (NORVASC) 5 MG tablet Take 1 tablet (5 mg total) by mouth daily. 06/24/14  Yes Brunetta Jeans, PA-C  Cyanocobalamin (VITAMIN B-12) 2500 MCG SUBL Place 5,000 mcg under the tongue daily.   Yes Historical Provider, MD  naproxen sodium (ANAPROX) 220 MG tablet Take 660 mg by mouth daily as needed (headache).   Yes Historical Provider, MD  meloxicam (MOBIC) 15 MG tablet Take 1 tablet (15 mg total) by mouth daily. Patient not taking: Reported on 10/22/2014 05/27/14   Brunetta Jeans, PA-C     History   Social History  . Marital Status: Married    Spouse Name: N/A  . Number of Children: N/A  . 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 Smoking status: Current Every Day Smoker -- 1.00 packs/day for 31 years    Types: Cigarettes  . Smokeless tobacco: Never Used  . Alcohol Use: No  . Drug Use: No  . Sexual Activity:    Partners: Male   Other  Topics Concern  . Not on file   Social History Narrative   Lives in [place] with family.    Family Status  Relation Status Death Age  . Mother Alive   . Father Deceased   . Sister Alive   . Brother Alive    Family History  Problem Relation Age of Onset  . Diabetes Mother   . Hypertension Mother   . Cancer Father 104    oral  . Diabetes Sister   . Hypertension Sister   . Diabetes Brother   . Hypertension Brother   . Sudden Cardiac Death Neg Hx   . Heart attack Neg Hx      ROS:  Full 14 point review of systems complete and found to be negative unless listed above.  Physical Exam: Blood pressure  172/97, pulse 70, temperature 98 F (36.7 C), temperature source Oral, resp. rate 19, height 5\' 2"  (1.575 m), weight 168 lb (76.204 kg), last menstrual period 10/11/2014, SpO2 100 %.  General: Well developed, well nourished, female in no acute distress Head: Eyes PERRLA, No xanthomas.   Normocephalic and atraumatic, oropharynx without edema or exudate. Dentition: good Lungs: CTA bilaterally Heart: HRRR S1 S2, no rub/gallop, soft systolic murmur, LLSB. pulses are 2+ all 4 extrem.   Neck: No carotid bruits. No lymphadenopathy.  JVD not elevated. Abdomen: Bowel sounds present, abdomen soft and non-tender without masses or hernias noted. Msk:  No spine or cva tenderness. No weakness, no joint deformities or effusions. Extremities: No clubbing or cyanosis. No edema.  Neuro: Alert and oriented X 3. No focal deficits noted. Psych:  Good affect, responds appropriately Skin: No rashes or lesions noted.  Labs:   Lab Results  Component Value Date   WBC 6.8 10/22/2014   HGB 12.4 10/22/2014   HCT 37.0 10/22/2014   MCV 83.1 10/22/2014   PLT 386 10/22/2014    Recent Labs  10/22/14 0841  INR 1.03     Recent Labs Lab 10/22/14 0841  NA 137  K 3.6  CL 105  CO2 23  BUN 10  CREATININE 0.54  CALCIUM 9.3  PROT 7.6  BILITOT 0.4  ALKPHOS 75  ALT 13*  AST 19  GLUCOSE 96  ALBUMIN 4.2   MAGNESIUM  Date Value Ref Range Status  10/22/2014 1.8 1.7 - 2.4 mg/dL Final    Recent Labs  10/22/14 0859 10/22/14 1140  TROPIPOC 0.00 0.00   TSH  Date/Time Value Ref Range Status  10/22/2014 08:47 AM 0.659 0.350 - 4.500 uIU/mL Final   Urinalysis    Component Value Date/Time   COLORURINE YELLOW 10/22/2014 0922   APPEARANCEUR CLOUDY* 10/22/2014 0922   LABSPEC 1.014 10/22/2014 0922   PHURINE 7.0 10/22/2014 0922   GLUCOSEU NEGATIVE 10/22/2014 0922   GLUCOSEU NEGATIVE 07/30/2014 0859   HGBUR MODERATE* 10/22/2014 0922   BILIRUBINUR NEGATIVE 10/22/2014 0922   KETONESUR NEGATIVE 10/22/2014  0922   PROTEINUR NEGATIVE 10/22/2014 0922   UROBILINOGEN 0.2 10/22/2014 0922   NITRITE NEGATIVE 10/22/2014 0922   LEUKOCYTESUR NEGATIVE 10/22/2014 0922   Drugs of Abuse     Component Value Date/Time   LABOPIA POSITIVE* 10/22/2014 0922   COCAINSCRNUR NONE DETECTED 10/22/2014 0922   LABBENZ NONE DETECTED 10/22/2014 0922   AMPHETMU NONE DETECTED 10/22/2014 0922   THCU NONE DETECTED 10/22/2014 0922   LABBARB NONE DETECTED 10/22/2014 0922   Lab Results  Component Value Date   CHOL 156 07/30/2014   HDL 40.60 07/30/2014   LDLCALC 104* 07/30/2014  TRIG 59.0 07/30/2014   CHOLHDL 4 07/30/2014   Echo: pending  ECG:  10/22/2014 SR, PVCs, no acute ischemic changes  Radiology:  Dg Chest 2 View 10/22/2014   CLINICAL DATA:  Intermittent chest pain for 1 month. Increasing frequency. Heart palpitations.  EXAM: CHEST - 2 VIEW  COMPARISON:  None.  FINDINGS: Heart size a is normal. Mild parahilar prominence of the interstitium is likely related to smoking. No focal airspace disease is present. There is no edema or effusion to suggest failure. Surgical changes are noted at the cervical spine. The visualized soft tissues and bony thorax are otherwise unremarkable.  IMPRESSION: 1. Mild infrahilar interstitial coarsening bilaterally is likely related to smoking. 2. No acute cardiopulmonary disease.   Electronically Signed   By: San Morelle M.D.   On: 10/22/2014 08:43    ASSESSMENT AND PLAN:   The patient was seen today by Dr Angelena Form, the patient evaluated and the data reviewed.  Principal Problem:   Symptomatic PVCs - UDS OK, TSH OK - Mg and K+ lower edge of normal, will supplement for now. - BB added at low dose, metoprolol 12.5 mg bid - can increase if tolerated - f/u on echo results - Event monitor ordered and message sent to office to schedule this and f/u appt.  Active Problems:   Essential hypertension, benign - per IM    Chest pain - atypical - ez neg so far - CRFs are HTN,  tob use - MD advise on stress testing and if can do as OP.    Cigarette nicotine dependence - cessation encouraged.  SignedLenoard Aden 10/22/2014 3:22 PM Beeper [phone]  I have personally seen and examined this patient with Rosaria Ferries, PA-C. I agree with the assessment and plan as outlined above. She is admitted with symptomatic PVCs. Agree with addition of beta blocker. Echo is pending today. TSH is ok. Replace electrolytes. If we cannot control PVCs with beta blockade, could consider ablation procedure. Will arrange event monitor on discharge to exclude VT but would continue telemetry for now. She will need to f/u with EP in the EP clinic after discharge.   Jessilyn Catino 10/22/2014 4:06 PM

## 2014-10-22 NOTE — ED Provider Notes (Signed)
CSN: [id number]     Arrival date & time 10/22/14  0745 History   First MD Initiated Contact with Patient 10/22/14 [phone]     Chief Complaint  Patient presents with  . Palpitations  . Chest Pain    (Consider location/radiation/quality/duration/timing/severity/associated sxs/prior Treatment) HPI  [name] is a 44 year old female, current smoker with a 20-pack-year history, with past medical history of hypertension and gout who presented to the emergency room today with sudden onset palpitation that began approximately 6 AM.  She has had intermittent palpitations for about a month and a half, and they have gradually increased in their frequency and duration. This morning her palpitations began at 6 AM and lasted for 2 hours, and associated with hot flashes, lightheadedness, dizziness, nausea and SOB. She denies any alleviating or aggravating factors to her palpitations, they have occurred when at rest and with minimal exertion.  She has also experienced left sided chest pain, described as a dull, ache-like pain in her left chest and armpit that radiated down her left arm, with some numbness and tingling. The patient recently established care with PCP, and has been treated for hypertension last year. She denies any other past medical history. She denies any alcohol use, illegal drug use, injected drugs.  She has no personal cancer history, denies any recent travel.  She has no orthopnea, PND or lower extremity edema   Past Medical History  Diagnosis Date  . Hypertension   . Gout 2011   Past Surgical History  Procedure Laterality Date  . Neck surgery    . Tubal ligation     Family History  Problem Relation Age of Onset  . Diabetes Mother   . Hypertension Mother   . Cancer Father 37    oral  . Diabetes Sister   . Hypertension Sister   . Diabetes Brother   . Hypertension Brother    History  Substance Use Topics  . Smoking status: Current Every Day Smoker -- 1.00 packs/day  for 31 years    Types: Cigarettes  . Smokeless tobacco: Never Used  . Alcohol Use: No   OB History    No data available     Review of Systems 10 Systems reviewed and are negative for acute change except as noted in the HPI.      Allergies  Lisinopril  Home Medications   Prior to Admission medications   Medication Sig Start Date End Date Taking? Authorizing Provider  amLODipine (NORVASC) 5 MG tablet Take 1 tablet (5 mg total) by mouth daily. 06/24/14  Yes Brunetta Jeans, PA-C  Cyanocobalamin (VITAMIN B-12) 2500 MCG SUBL Place 5,000 mcg under the tongue daily.   Yes Historical Provider, MD  naproxen sodium (ANAPROX) 220 MG tablet Take 660 mg by mouth daily as needed (headache).   Yes Historical Provider, MD  meloxicam (MOBIC) 15 MG tablet Take 1 tablet (15 mg total) by mouth daily. Patient not taking: Reported on 10/22/2014 05/27/14   Brunetta Jeans, PA-C   BP 166/97 mmHg  Pulse 85  Temp(Src) 98 F (36.7 C) (Oral)  Resp 18  Ht 5' 1.75" (1.568 m)  Wt 168 lb (76.204 kg)  BMI 30.99 kg/m2  SpO2 100%  LMP 10/11/2014 Physical Exam  Constitutional: She is oriented to person, place, and time. She appears well-developed and well-nourished. No distress.  Patient appears distressed, laying in the ER gurney, nontoxic appearing  HENT:  Head: Normocephalic and atraumatic.  Nose: Nose normal.  Mouth/Throat: Oropharynx is clear  and moist. No oropharyngeal exudate.  Eyes: Conjunctivae and EOM are normal. Pupils are equal, round, and reactive to light. Right eye exhibits no discharge. Left eye exhibits no discharge. No scleral icterus.  Neck: Normal range of motion. No JVD present. No tracheal deviation present. No thyromegaly present.  Cardiovascular: Normal rate, normal heart sounds and intact distal pulses.  Exam reveals no gallop and no friction rub.   No murmur heard. No lower extremity edema, symmetrical pulses radial 2+, dorsal pedis 2+ Irregular rate with frequent premature beats,  no murmur gallop or rub  Pulmonary/Chest: Effort normal and breath sounds normal. No respiratory distress. She has no wheezes. She has no rales. She exhibits no tenderness.  Clear to auscultation anteriorly and posteriorly, without wheezes crackles or rhonchi  Abdominal: Soft. Bowel sounds are normal. She exhibits no distension and no mass. There is no tenderness. There is no rebound and no guarding.  Musculoskeletal: Normal range of motion. She exhibits no edema or tenderness.  Lymphadenopathy:    She has no cervical adenopathy.  Neurological: She is alert and oriented to person, place, and time. She has normal reflexes. No cranial nerve deficit. She exhibits normal muscle tone. Coordination normal.  Skin: Skin is warm and dry. No rash noted. She is not diaphoretic. No erythema. No pallor.  Psychiatric: She has a normal mood and affect. Her behavior is normal. Judgment and thought content normal.  Nursing note and vitals reviewed.   ED Course  Procedures (including critical care time) Labs Review Labs Reviewed  PROTIME-INR  URINALYSIS, ROUTINE W REFLEX MICROSCOPIC (NOT AT [facility])  CBC WITH DIFFERENTIAL/PLATELET  TSH  URINE RAPID DRUG SCREEN, HOSP PERFORMED  MAGNESIUM  COMPREHENSIVE METABOLIC PANEL  I-STAT TROPOININ, ED   Imaging Review No results found.   EKG Interpretation   Date/Time:  Tuesday October 22 2014 07:53:59 EDT Ventricular Rate:  84 PR Interval:  142 QRS Duration: 73 QT Interval:  385 QTC Calculation: 455 R Axis:   38 Text Interpretation:  Sinus rhythm Ventricular premature complex  Anteroseptal infarct, old No acute ischemia.  Confirmed by Gerald Leitz ([id number]) on 10/22/2014 7:58:36 AM      MDM   Final diagnoses:  None   Palpitations associated with hot flashes, nausea, lightheadedness CP work up initiated - TSH, urine drug screen Heart score of 3  Risk factors HTN, cigarette smoking, PERC negative (no d-dimer) and do not suspect PE  Patient was  given aspirin, 1 inch of Nitropaste, IV fluids, morphine Initial troponin is negative, there've been no significant arrhythmias on telemetry monitoring, however there are intermittent PVCs, and patient has had another episode of left-sided chest pain which she describes as "pressure"  Pt will be admitted for CP r/o, given risk factors, suspicious story and continuation of CP despite nitropaste.  Dr. Janece Canterbury admitting for CP to stepdown.         Delsa Grana, PA-C 10/29/14 1349  Malvin Johns, MD 11/08/14 [phone]

## 2014-10-22 NOTE — Progress Notes (Signed)
Dr. Cleaster Corin and Dr. Virgina Norfolk are the Medical Doctors at [facility].

## 2014-10-22 NOTE — H&P (Addendum)
[facility] History and Physical  Reeshemah Nazaryan IRJ:[id number] DOB: [birth year] DOA: 10/22/2014  Referring physician:  Delsa Grana PCP:  Leeanne Rio, PA-C   Chief Complaint:  Left arm pain  HPI:  The patient is a 44 y.o. year-old female with history of hypertension, ongoing cigarette smoking, and gout who presents with palpitations and left arm pain.  The patient was last at their baseline health about a month ago.  The patient states that about a month ago she started noticing occasional palpitations. She would notice them every few days, but over the last week she has noticed them with increasing frequency. She has noted them continuously for the last few days. She states that this morning she felt like she was having continuous racing heart or palpitations and while she was at work she had sudden onset of pain under her left arm which radiated down to her fingers with some pressure on the top of her left shoulder, nausea, and a faint feeling. It lasted for several minutes and then she started noticing less heart racing sensation and gradually the pain and nausea subsided. She came to the emergency department where she had 2 more similar episodes. She was given nitroglycerin patch which initially helped her symptoms, but then she had a recurrence later. She denies orthopnea, lower extremity edema, dyspnea or chest pains on exertion.  She has not had any recent medication changes, denies illicit drug use, over-the-counter stimulant use, family history of sudden cardiac death or early MI.  Drinks coffee 2-3 cups and 3-4 cups of soda per day but despite decreasing her caffeine in the last few weeks, she has had increased palpitations.  In the emergency department, her vital signs were stable however she had frequent PVCs which were symptomatically with palpitation feeling, and whenever frequent, she would develop lightheadedness. She had approximately 2-3 PVCs per strip noted on the  telemetry at the times that she was symptomatic, sometimes occurring every other beat.  Her electrolytes are within normal limits. Her EKG demonstrated normal sinus rhythm. Her troponin was negative. She is being admitted for increased frequency of symptomatic PVC.    Review of Systems:  General:  Denies fevers, chills, weight loss or gain HEENT:  Denies changes to hearing and vision, rhinorrhea, sinus congestion, sore throat CV:  Per HPI PULM:  Denies SOB, wheezing, cough.   GI:  Denies vomiting, constipation, diarrhea.   GU:  Denies dysuria, frequency, urgency ENDO:  Denies polyuria, polydipsia.   HEME:  Denies hematemesis, blood in stools, melena, abnormal bruising or bleeding.  LYMPH:  Denies lymphadenopathy.   MSK:  Denies arthralgias, myalgias.   DERM:  Denies skin rash or ulcer.   NEURO:  Denies focal numbness, weakness, slurred speech, confusion, facial droop.  PSYCH:  Denies anxiety and depression.    Past Medical History  Diagnosis Date  . Hypertension   . Gout 2011   Past Surgical History  Procedure Laterality Date  . Neck surgery    . Tubal ligation     Social History:  reports that she has been smoking Cigarettes.  She has a 31 pack-year smoking history. She has never used smokeless tobacco. She reports that she does not drink alcohol or use illicit drugs.  Allergies  Allergen Reactions  . Lisinopril Hives and Swelling    Family History  Problem Relation Age of Onset  . Diabetes Mother   . Hypertension Mother   . Cancer Father 20    oral  . Diabetes Sister   .  Hypertension Sister   . Diabetes Brother   . Hypertension Brother      Prior to Admission medications   Medication Sig Start Date End Date Taking? Authorizing Provider  amLODipine (NORVASC) 5 MG tablet Take 1 tablet (5 mg total) by mouth daily. 06/24/14  Yes Brunetta Jeans, PA-C  Cyanocobalamin (VITAMIN B-12) 2500 MCG SUBL Place 5,000 mcg under the tongue daily.   Yes Historical Provider, MD   naproxen sodium (ANAPROX) 220 MG tablet Take 660 mg by mouth daily as needed (headache).   Yes Historical Provider, MD  meloxicam (MOBIC) 15 MG tablet Take 1 tablet (15 mg total) by mouth daily. Patient not taking: Reported on 10/22/2014 05/27/14   Brunetta Jeans, PA-C   Physical Exam: Filed Vitals:   10/22/14 1130 10/22/14 1215 10/22/14 1245 10/22/14 1410  BP: 133/82 132/76 108/70 172/97  Pulse: 77 77 82 70  Temp:      TempSrc:      Resp: 15 27 31 19   Height:      Weight:      SpO2: 98% 98% 100% 100%     General:  Adult female, NAD, able to tell when she has every PVC  Eyes:  PERRL, anicteric, non-injected.  ENT:  Nares clear.  OP clear, non-erythematous without plaques or exudates.  MMM.  Neck:  Supple without TM or JVD.    Lymph:  No cervical, supraclavicular, or submandibular LAD.    Cardiovascular:  RRR with frequent PVC, normal S1, S2, without m/r/g.  2+ pulses, warm extremities  Respiratory:  CTA bilaterally without increased WOB.  Abdomen:  NABS.  Soft, ND/NT.    Skin:  No rashes or focal lesions.  Musculoskeletal:  Normal bulk and tone.  No LE edema.  Psychiatric:  A & O x 4.  Appropriate affect.  Neurologic:  CN 3-12 intact.  5/5 strength.  Sensation intact.  Labs on Admission:  Basic Metabolic Panel:  Recent Labs Lab 10/22/14 0841  NA 137  K 3.6  CL 105  CO2 23  GLUCOSE 96  BUN 10  CREATININE 0.54  CALCIUM 9.3  MG 1.8   Liver Function Tests:  Recent Labs Lab 10/22/14 0841  AST 19  ALT 13*  ALKPHOS 75  BILITOT 0.4  PROT 7.6  ALBUMIN 4.2   No results for input(s): LIPASE, AMYLASE in the last 168 hours. No results for input(s): AMMONIA in the last 168 hours. CBC:  Recent Labs Lab 10/22/14 0841  WBC 6.8  NEUTROABS 3.2  HGB 12.4  HCT 37.0  MCV 83.1  PLT 386   Cardiac Enzymes: No results for input(s): CKTOTAL, CKMB, CKMBINDEX, TROPONINI in the last 168 hours.  BNP (last 3 results) No results for input(s): BNP in the last  8760 hours.  ProBNP (last 3 results) No results for input(s): PROBNP in the last 8760 hours.  CBG: No results for input(s): GLUCAP in the last 168 hours.  Radiological Exams on Admission: Dg Chest 2 View  10/22/2014   CLINICAL DATA:  Intermittent chest pain for 1 month. Increasing frequency. Heart palpitations.  EXAM: CHEST - 2 VIEW  COMPARISON:  None.  FINDINGS: Heart size a is normal. Mild parahilar prominence of the interstitium is likely related to smoking. No focal airspace disease is present. There is no edema or effusion to suggest failure. Surgical changes are noted at the cervical spine. The visualized soft tissues and bony thorax are otherwise unremarkable.  IMPRESSION: 1. Mild infrahilar interstitial coarsening bilaterally is likely related  to smoking. 2. No acute cardiopulmonary disease.   Electronically Signed   By: San Morelle M.D.   On: 10/22/2014 08:43    EKG:  NSR  Assessment/Plan Principal Problem:   Symptomatic PVCs Active Problems:   Essential hypertension, benign   Chest pain  ---  Left arm pain with nausea and faintness seem to be associated with clusters of PVC on telemetry.  When this occurred in the ER, she had NTG patch placed and her BP fell, which may have simply been secondary to the NTG patch.  This does not sound like ACS, but she may have decreased coronary perfusion during her frequent PVC.  HEART score of 3.  I will check one additional troponin.   -  Electrolytes, including magnesium are wnl -  TSH wnl and D-dimer negative -  ECHO to check function -  UDS positive for opiates only -  Start metoprolol 12.5mg  po BID -  Start aspirin -  Monitor on telemetry -  Continue prn nitroglycerin -  No statin since low suspicion for ACS  Hypertension, uncontrolled until recently -  Continue norvasc -  Add metoprolol  Cigarette abuse, 1 PDD -  Smoking cessation counseled  Gout, stable, no recent flare  Diet:  Healthy heart Access:  PIV IVF:   off Proph:  lovenox  Code Status: full Family Communication: patient alone Disposition Plan: Admit to telemetry  Time spent: 60 min Janece Canterbury [facility] Pager [phone]  If 7PM-7AM, please contact night-coverage [email or web address] Password [email or web address] 10/22/2014, 2:21 PM

## 2014-10-22 NOTE — Progress Notes (Signed)
  Echocardiogram 2D Echocardiogram has been performed.  Tresa Res 10/22/2014, 4:20 PM

## 2014-10-22 NOTE — ED Notes (Signed)
Pt states chest pain x 1 month.  Says that today she started feeling heart palpitations with dizzy.  Some pain at left axilla with numbness on right hand at one point.  Denies the numbness now.  Pt states pain not increased with eating.  No cough/congestion.  Pt does state some nausea with shortness of breath.  Pt does take HTN meds.

## 2014-10-23 ENCOUNTER — Other Ambulatory Visit: Payer: Self-pay | Admitting: Cardiology

## 2014-10-23 DIAGNOSIS — R42 Dizziness and giddiness: Secondary | ICD-10-CM | POA: Diagnosis not present

## 2014-10-23 DIAGNOSIS — R079 Chest pain, unspecified: Secondary | ICD-10-CM | POA: Diagnosis not present

## 2014-10-23 DIAGNOSIS — I10 Essential (primary) hypertension: Secondary | ICD-10-CM

## 2014-10-23 DIAGNOSIS — F17213 Nicotine dependence, cigarettes, with withdrawal: Secondary | ICD-10-CM | POA: Diagnosis not present

## 2014-10-23 DIAGNOSIS — R110 Nausea: Secondary | ICD-10-CM | POA: Diagnosis not present

## 2014-10-23 DIAGNOSIS — I493 Ventricular premature depolarization: Secondary | ICD-10-CM | POA: Diagnosis not present

## 2014-10-23 LAB — BASIC METABOLIC PANEL
Anion gap: 7 (ref 5–15)
BUN: 10 mg/dL (ref 6–20)
CALCIUM: 9.1 mg/dL (ref 8.9–10.3)
CO2: 24 mmol/L (ref 22–32)
Chloride: 107 mmol/L (ref 101–111)
Creatinine, Ser: 0.54 mg/dL (ref 0.44–1.00)
GFR calc non Af Amer: >60 mL/min (ref >60)
Glucose, Bld: 106 mg/dL — ABNORMAL HIGH (ref 65–99)
Potassium: 4 mmol/L (ref 3.5–5.1)
SODIUM: 138 mmol/L (ref 135–145)

## 2014-10-23 LAB — CBC
HEMATOCRIT: 38 % (ref 36.0–46.0)
Hemoglobin: 12.5 g/dL (ref 12.0–15.0)
MCH: 28 pg (ref 26.0–34.0)
MCHC: 32.9 g/dL (ref 30.0–36.0)
MCV: 85 fL (ref 78.0–100.0)
Platelets: 411 10*3/uL — ABNORMAL HIGH (ref 150–400)
RBC: 4.47 MIL/uL (ref 3.87–5.11)
RDW: 14.3 % (ref 11.5–15.5)
WBC: 8.5 10*3/uL (ref 4.0–10.5)

## 2014-10-23 MED ORDER — AMLODIPINE BESYLATE 5 MG PO TABS: 5.0000 mg | ORAL_TABLET | Freq: Every day | ORAL | Status: DC

## 2014-10-23 MED ORDER — METOPROLOL TARTRATE 25 MG PO TABS
50.0000 mg | ORAL_TABLET | Freq: Two times a day (BID) | ORAL | Status: DC
  Administered 2014-10-23: 50 mg via ORAL

## 2014-10-23 MED ORDER — METOPROLOL TARTRATE 25 MG PO TABS: 25.0000 mg | ORAL_TABLET | Freq: Two times a day (BID) | ORAL | Status: DC

## 2014-10-23 MED ORDER — NITROGLYCERIN 0.4 MG SL SUBL: 0.4000 mg | SUBLINGUAL_TABLET | SUBLINGUAL | Status: DC | PRN

## 2014-10-23 MED ORDER — ASPIRIN 81 MG PO TBEC: 81.0000 mg | DELAYED_RELEASE_TABLET | Freq: Every day | ORAL | Status: DC

## 2014-10-23 MED ORDER — METOPROLOL TARTRATE 25 MG PO TABS
25.0000 mg | ORAL_TABLET | Freq: Two times a day (BID) | ORAL | Status: DC
  Filled 2014-10-23: qty 1

## 2014-10-23 NOTE — Progress Notes (Signed)
Discharged home with instructions.

## 2014-10-23 NOTE — Discharge Summary (Signed)
Physician Discharge Summary  Emma Stephens IZT:[id number] DOB: 1970-08-17 DOA: 10/22/2014  PCP: Leeanne Rio, PA-C  Admit date: 10/22/2014 Discharge date: 10/23/2014  Recommendations for Outpatient Follow-up:  1. Follow-up with cardiology, EP, and a few weeks 2. Event monitor ordered, and recommending an outpatient nuclear stress test  Discharge Diagnoses:  Principal Problem:   Symptomatic PVCs Active Problems:   Essential hypertension, benign   Chest pain   Cigarette nicotine dependence   Discharge Condition: Stable, improved  Diet recommendation: Low-sodium  Wt Readings from Last 3 Encounters:  10/23/14 72.6 kg (160 lb 0.9 oz)  07/30/14 74.39 kg (164 lb)  07/24/14 75.297 kg (166 lb)    History of present illness:   The patient is a 44 y.o. year-old female with history of hypertension, ongoing cigarette smoking, and gout who presented with palpitations and left arm pain. She started noting palpitations about one month ago and the started increasing in frequency over the last week. She felt them continuously during the few days prior to admission. On the day of admission, she developed some pain under the left arm which radiated down her left arm and up to her shoulder and left neck which was associated with some faintness and nausea and flushing.  She denied orthopnea, lower extremity edema, dyspnea or chest pains on exertion. She had not had any recent medication changes, denied illicit drug use, over-the-counter stimulant use, family history of sudden cardiac death or early MI. She had already been decreasing her caffeine in the last few weeks.  In the emergency department, her vital signs were stable however she had frequent PVCs which were symptomatic.  She had approximately 2-3 PVCs per strip noted on the telemetry at the times that she was symptomatic, sometimes occurring every other beat. Her electrolytes were within normal limits. Her EKG demonstrated normal sinus  rhythm. Her troponin was negative. She was admitted for increased frequency of symptomatic PVC and chest pain.   Hospital Course:   Left arm pain with nausea and faintness seem to be associated with clusters of PVC on telemetry. Her story was atypical for ACS, but she may have decreased coronary perfusion during her frequent PVC. HEART score of 3. Her troponins were negative.  Telemetry demonstrated normal sinus rhythm with frequent PVCs. Her TSH was within normal limits and her d-dimer was negative. Her echo cardiogram demonstrated normal ejection fraction, normal systolic function with some mild LVH which was probably secondary to her hypertension. She was started on metoprolol 12.5 mg by mouth twice a day which decreased her PVC frequency slightly. Her metoprolol was then increased to 25 mg by mouth twice a day to continue as an outpatient.  She was seen by cardiology who is arranging for an event monitor. For her chest pain, she was given aspirin, beta blocker.  She will follow-up with cardiology in a few weeks to for consideration of an outpatient stress test.  She was given when necessary nitroglycerin to use in the meantime.  Hypertension, uncontrolled until recently. Metoprolol was added to her Norvasc, but her blood pressures remained somewhat elevated. Defer further management to her primary care doctor and cardiology.  Cigarette abuse, 1 PDD, Smoking cessation counseled  Gout, stable, no recent flare  Procedures:  Echocardiogram  Consultations:  Cardiology  Discharge Exam: Filed Vitals:   10/23/14 1202  BP: 155/84  Pulse:   Temp:   Resp: 17   Filed Vitals:   10/23/14 0848 10/23/14 0927 10/23/14 1049 10/23/14 1202  BP:  150/92  161/88 155/84  Pulse:      Temp:      TempSrc:      Resp: 20  18 17   Height:      Weight:      SpO2: 99%  100% 100%    General: Adult female, no acute distress Cardiovascular: Regular rate and rhythm with occasional PVCs Respiratory:  Clear to auscultation bilaterally Abdomen: NABS, soft, nondistended, nontender MSK: Normal tone and bulk, no lower extremity edema  Discharge Instructions      Discharge Instructions    Call MD for:  difficulty breathing, headache or visual disturbances    Complete by:  As directed      Call MD for:  extreme fatigue    Complete by:  As directed      Call MD for:  hives    Complete by:  As directed      Call MD for:  persistant dizziness or light-headedness    Complete by:  As directed      Call MD for:  persistant nausea and vomiting    Complete by:  As directed      Call MD for:  severe uncontrolled pain    Complete by:  As directed      Call MD for:  temperature >100.4    Complete by:  As directed      Diet - low sodium heart healthy    Complete by:  As directed      Discharge instructions    Complete by:  As directed   You were hospitalized with palpitations and chest pains.  You were started on a blood pressure medication called metoprolol which would help slow down the number of premature heart contractions you are having.  You will have an event monitor placed by cardiology to monitor your heart rhythm for a while and help the cardiologists figure out how to adjust your medications or help them determine if you any procedures to help your heart slow down.  I have given you a prescription for nitroglycerin to try if you have left arm or chest pain.  Please take an aspirin 81mg  every day until you follow up with cardiology.     Increase activity slowly    Complete by:  As directed             Medication List    STOP taking these medications        meloxicam 15 MG tablet  Commonly known as:  MOBIC      TAKE these medications        amLODipine 5 MG tablet  Commonly known as:  NORVASC  Take 1 tablet (5 mg total) by mouth daily.     aspirin 81 MG EC tablet  Take 1 tablet (81 mg total) by mouth daily.     metoprolol tartrate 25 MG tablet  Commonly known as:   LOPRESSOR  Take 1 tablet (25 mg total) by mouth 2 (two) times daily.     naproxen sodium 220 MG tablet  Commonly known as:  ANAPROX  Take 660 mg by mouth daily as needed (headache).     nitroGLYCERIN 0.4 MG SL tablet  Commonly known as:  NITROSTAT  Place 1 tablet (0.4 mg total) under the tongue every 5 (five) minutes x 3 doses as needed for chest pain.     Vitamin B-12 2500 MCG Subl  Place 5,000 mcg under the tongue daily.       Follow-up Information  Follow up with [facility] On 10/28/2014.   Why:  at 9:30 am   Contact information:   [place] [phone]       Follow up with Virl Axe, MD On 10/29/2014.   Specialty:  Cardiology   Why:  at 1:00PM   Contact information:   [address] [address] [phone]       Follow up with Leeanne Rio, PA-C. Schedule an appointment as soon as possible for a visit in 2 weeks.   Specialty:  Physician Assistant   Contact information:   [address] [place] [address] [phone]       Follow up with [facility] On 10/25/2014.   Specialty:  Cardiology   Why:  this is for event monitor at [facility].    Contact information:   [address], [place] [phone]       The results of significant diagnostics from this hospitalization (including imaging, microbiology, ancillary and laboratory) are listed below for reference.    Significant Diagnostic Studies: Dg Chest 2 View  10/22/2014   CLINICAL DATA:  Intermittent chest pain for 1 month. Increasing frequency. Heart palpitations.  EXAM: CHEST - 2 VIEW  COMPARISON:  None.  FINDINGS: Heart size a is normal. Mild parahilar prominence of the interstitium is likely related to smoking. No focal airspace disease is present. There is no edema or effusion to suggest failure. Surgical changes are noted at the cervical spine. The  visualized soft tissues and bony thorax are otherwise unremarkable.  IMPRESSION: 1. Mild infrahilar interstitial coarsening bilaterally is likely related to smoking. 2. No acute cardiopulmonary disease.   Electronically Signed   By: San Morelle M.D.   On: 10/22/2014 08:43    Microbiology: Recent Results (from the past 240 hour(s))  MRSA PCR Screening     Status: None   Collection Time: 10/22/14  4:09 PM  Result Value Ref Range Status   MRSA by PCR NEGATIVE NEGATIVE Final    Comment:        The GeneXpert MRSA Assay (FDA approved for NASAL specimens only), is one component of a comprehensive MRSA colonization surveillance program. It is not intended to diagnose MRSA infection nor to guide or monitor treatment for MRSA infections. Performed at [facility]: Basic Metabolic Panel:  Recent Labs Lab 10/22/14 0841 10/23/14 0333  NA 137 138  K 3.6 4.0  CL 105 107  CO2 23 24  GLUCOSE 96 106*  BUN 10 10  CREATININE 0.54 0.54  CALCIUM 9.3 9.1  MG 1.8  --    Liver Function Tests:  Recent Labs Lab 10/22/14 0841  AST 19  ALT 13*  ALKPHOS 75  BILITOT 0.4  PROT 7.6  ALBUMIN 4.2   No results for input(s): LIPASE, AMYLASE in the last 168 hours. No results for input(s): AMMONIA in the last 168 hours. CBC:  Recent Labs Lab 10/22/14 0841 10/23/14 0333  WBC 6.8 8.5  NEUTROABS 3.2  --   HGB 12.4 12.5  HCT 37.0 38.0  MCV 83.1 85.0  PLT 386 411*   Cardiac Enzymes:  Recent Labs Lab 10/22/14 1413  TROPONINI <0.03   BNP: BNP (last 3 results) No results for input(s): BNP in the last 8760 hours.  ProBNP (last 3 results) No results for input(s): PROBNP in the last  8760 hours.  CBG: No results for input(s): GLUCAP in the last 168 hours.  Time coordinating discharge: 35 minutes  Signed:  Jakhiya Brower  [facility] 10/23/2014, 12:21 PM

## 2014-10-23 NOTE — Progress Notes (Signed)
Pt Profile 44 y.o. year old female with a history of HTN, tob use, gout, but no cardiac issues. She was admitted with symptomatic PVCs and chest pain.   Subjective: No further chest pain, + palpitations, though improved during the night.    Objective: Vital signs in last 24 hours: Temp:  [97.6 F (36.4 C)-98.1 F (36.7 C)] 98.1 F (36.7 C) (08/03 0500) Pulse Rate:  [68-96] 96 (08/03 0600) Resp:  [13-31] 20 (08/03 0848) BP: (108-183)/(65-97) 150/92 mmHg (08/03 0800) SpO2:  [96 %-100 %] 99 % (08/03 0848) Weight:  [160 lb 0.9 oz (72.6 kg)-162 lb 7.7 oz (73.7 kg)] 160 lb 0.9 oz (72.6 kg) (08/03 0500) Weight change:  Last BM Date: 10/21/14 Intake/Output from previous day: +520 08/02 0701 - 08/03 0700 In: 720 [P.O.:720] Out: 200 [Urine:200] Intake/Output this shift:    PE: General:Pleasant affect, NAD Skin:Warm and dry, brisk capillary refill HEENT:normocephalic, sclera clear, mucus membranes moist Heart:S1S2 RRR without murmur, gallup, rub or click Lungs:clear without rales, rhonchi, or wheezes WVP:XTGG, non tender, + BS, do not palpate liver spleen or masses Ext:no lower ext edema, 2+ pedal pulses, 2+ radial pulses Neuro:alert and oriented, MAE, follows commands, + facial symmetry Tele:  PVCs less during the night, but continue this AM   Lab Results:  Recent Labs  10/22/14 0841 10/23/14 0333  WBC 6.8 8.5  HGB 12.4 12.5  HCT 37.0 38.0  PLT 386 411*   BMET  Recent Labs  10/22/14 0841 10/23/14 0333  NA 137 138  K 3.6 4.0  CL 105 107  CO2 23 24  GLUCOSE 96 106*  BUN 10 10  CREATININE 0.54 0.54  CALCIUM 9.3 9.1    Recent Labs  10/22/14 1413  TROPONINI <0.03    Lab Results  Component Value Date   CHOL 156 07/30/2014   HDL 40.60 07/30/2014   LDLCALC 104* 07/30/2014   TRIG 59.0 07/30/2014   CHOLHDL 4 07/30/2014   Lab Results  Component Value Date   HGBA1C 5.8 07/30/2014     Lab Results  Component Value Date   TSH 0.659 10/22/2014    Hepatic Function Panel  Recent Labs  10/22/14 0841  PROT 7.6  ALBUMIN 4.2  AST 19  ALT 13*  ALKPHOS 75  BILITOT 0.4   No results for input(s): CHOL in the last 72 hours. No results for input(s): PROTIME in the last 72 hours.     Studies/Results: Dg Chest 2 View  10/22/2014   CLINICAL DATA:  Intermittent chest pain for 1 month. Increasing frequency. Heart palpitations.  EXAM: CHEST - 2 VIEW  COMPARISON:  None.  FINDINGS: Heart size a is normal. Mild parahilar prominence of the interstitium is likely related to smoking. No focal airspace disease is present. There is no edema or effusion to suggest failure. Surgical changes are noted at the cervical spine. The visualized soft tissues and bony thorax are otherwise unremarkable.  IMPRESSION: 1. Mild infrahilar interstitial coarsening bilaterally is likely related to smoking. 2. No acute cardiopulmonary disease.   Electronically Signed   By: San Morelle M.D.   On: 10/22/2014 08:43   ECHO: Study Conclusions  - Left ventricle: The cavity size was normal. Wall thickness was increased in a pattern of mild LVH. Systolic function was normal. The estimated ejection fraction was in the range of 60% to 65%. Wall motion was normal; there were no regional wall motion abnormalities.   Medications: I have reviewed the patient's current medications.  Scheduled Meds: . aspirin EC  81 mg Oral Daily  . enoxaparin (LOVENOX) injection  40 mg Subcutaneous Q24H  . magnesium oxide  200 mg Oral Daily  . metoprolol tartrate  25 mg Oral BID  . nicotine  14 mg Transdermal Daily  . potassium chloride  10 mEq Oral Daily  . vitamin B-12  5,000 mcg Oral Daily   Continuous Infusions:  PRN Meds:.acetaminophen, nitroGLYCERIN, ondansetron (ZOFRAN) IV  Assessment/Plan: Principal Problem:   Symptomatic PVCs Active Problems:   Essential hypertension, benign   Chest pain   Cigarette nicotine dependence  Symptomatic PVCs - UDS OK, TSH OK -  Mg and K+ were lower edge of normal, K+ 4.0 today  - BB added at low dose, metoprolol 25 mg bid- but would increase to 50 BID with continued symptoms and it has slowed the PVCs, decreased at night.   - can increase if tolerated - echo with mild LVH, EF normal with normal systolic function. No regional wall abnormalities.  - Event monitor ordered and message sent to office to schedule this and f/u appt. -  If we cannot control PVCs with beta blockade, could consider ablation procedure. - will need to follow up with EP in EP clinic after discharge.   ADDENDUM:  She recd 12.5 BID yesterday and 25 mg this AM, if BP stable, will increase to the 50 beginning tonight if Dr. Radford Pax agrees.  Active Problems:  Essential hypertension, benign - per IM    Chest pain - atypical- occurred mostly with her PVCs, also associated with SOB, pain mostly Lt axilla with some radiation to Lt shoulder and down lt arm,  Also some tingling in Rt arm.   -  Troponin is neg.  - CRFs are HTN, tob use - ?? stress testing and if can do as OP.   Cigarette nicotine dependence - cessation encouraged.     Time spent with pt. :15 minutes. [facility] R  Nurse Practitioner Certified Pager [phone] or after 5pm and on weekends call [phone] 10/23/2014, 9:02 AM

## 2014-10-23 NOTE — Discharge Instructions (Signed)
No caffeine  You will have stress test at [facility], wear shoes to walk on treadmill and loose comfortable clothing.   Nothing to eat or drink for 3 hours prior to test.  You will be scheduled for an outpt monitor as well. 10/25/14 at noon  No caffeine for 2 days prior to test.   No pseudoephedrine for cold symptoms.  This may increase episodes.   Stop smoking.

## 2014-10-24 ENCOUNTER — Telehealth (HOSPITAL_COMMUNITY): Payer: Self-pay | Admitting: *Deleted

## 2014-10-24 NOTE — Telephone Encounter (Signed)
Patient given detailed instructions per Myocardial Perfusion Study Information Sheet for test on 10/28/14 at 0930. Patient Notified to arrive 15 minutes early, and that it is imperative to arrive on time for appointment to keep from having the test rescheduled. Patient verbalized understanding. Dejae Bernet, Ranae Palms

## 2014-10-25 ENCOUNTER — Telehealth (HOSPITAL_COMMUNITY): Payer: Self-pay

## 2014-10-25 ENCOUNTER — Telehealth: Payer: Self-pay | Admitting: *Deleted

## 2014-10-25 ENCOUNTER — Ambulatory Visit (INDEPENDENT_AMBULATORY_CARE_PROVIDER_SITE_OTHER): Payer: BLUE CROSS/BLUE SHIELD

## 2014-10-25 DIAGNOSIS — I493 Ventricular premature depolarization: Secondary | ICD-10-CM

## 2014-10-25 NOTE — Telephone Encounter (Signed)
Unable to reach patient at time of TCM Call. Left message for patient to return call when available.  

## 2014-10-25 NOTE — Telephone Encounter (Signed)
Patient given detailed instructions per Myocardial Perfusion Study Information Sheet for test on 10-28-2014 at 0930. Patient Notified to arrive 15 minutes early, and that it is imperative to arrive on time for appointment to keep from having the test rescheduled. Patient verbalized understanding. Oletta Lamas, Rahcel Shutes A

## 2014-10-28 ENCOUNTER — Ambulatory Visit (HOSPITAL_COMMUNITY): Payer: BLUE CROSS/BLUE SHIELD | Attending: Cardiology

## 2014-10-28 DIAGNOSIS — R079 Chest pain, unspecified: Secondary | ICD-10-CM | POA: Diagnosis not present

## 2014-10-28 DIAGNOSIS — I493 Ventricular premature depolarization: Secondary | ICD-10-CM | POA: Insufficient documentation

## 2014-10-28 LAB — MYOCARDIAL PERFUSION IMAGING
CHL CUP NUCLEAR SDS: 1
CHL RATE OF PERCEIVED EXERTION: 16
CSEPHR: 88 %
Estimated workload: 10.1 METS
Exercise duration (min): 9 min
LHR: 0.34
LV sys vol: 36 mL
LVDIAVOL: 90 mL
MPHR: 175 {beats}/min
Peak HR: 155 {beats}/min
Rest HR: 73 {beats}/min
SRS: 0
SSS: 1
TID: 0.9

## 2014-10-28 MED ORDER — TECHNETIUM TC 99M SESTAMIBI GENERIC - CARDIOLITE
9.7000 | Freq: Once | INTRAVENOUS | Status: AC | PRN
  Administered 2014-10-28: 10 via INTRAVENOUS

## 2014-10-28 MED ORDER — TECHNETIUM TC 99M SESTAMIBI GENERIC - CARDIOLITE
31.5000 | Freq: Once | INTRAVENOUS | Status: AC | PRN
  Administered 2014-10-28: 32 via INTRAVENOUS

## 2014-10-29 ENCOUNTER — Ambulatory Visit (INDEPENDENT_AMBULATORY_CARE_PROVIDER_SITE_OTHER): Payer: BLUE CROSS/BLUE SHIELD | Admitting: Internal Medicine

## 2014-10-29 ENCOUNTER — Encounter: Payer: Self-pay | Admitting: *Deleted

## 2014-10-29 ENCOUNTER — Encounter: Payer: Self-pay | Admitting: Internal Medicine

## 2014-10-29 VITALS — BP 124/76 | HR 87 | Ht 61.75 in | Wt 167.2 lb

## 2014-10-29 DIAGNOSIS — I493 Ventricular premature depolarization: Secondary | ICD-10-CM

## 2014-10-29 MED ORDER — METOPROLOL TARTRATE 50 MG PO TABS: 50.0000 mg | ORAL_TABLET | Freq: Two times a day (BID) | ORAL | Status: DC

## 2014-10-29 NOTE — Patient Instructions (Signed)
Medication Instructions:  Your physician has recommended you make the following change in your medication:  1) STOP Amlodipine 2) INCREASE Metoprolol to 50 mg twice a day  Labwork: None ordered  Testing/Procedures: None ordered  Follow-Up: Your physician recommends that you schedule a follow-up appointment in: 4 weeks with Chanetta Marshall, NP   Any Other Special Instructions Will Be Listed Below (If Applicable). Thank you for choosing [facility]!!

## 2014-10-29 NOTE — Progress Notes (Signed)
ELECTROPHYSIOLOGY CONSULT NOTE  Patient ID: Emma Stephens, MRN: [id number], DOB/AGE: [birth year] 44 y.o. Admit date: (Not on file) Date of Consult: 10/29/2014  Primary Physician: Leeanne Rio, PA-C Primary Cardiologist:[facility]  Chief Complaint: PVC   HPI Emma Stephens is a 44 y.o. female  Referred because of symptomatic PVCs. She was admitted to the hospital just a few weeks ago because of chest pain and was found to have PVCs which he been associated with fluttering over the preceding month Or so.  She was given an event recorder and comes in today with occasional PVCs noted. ECG demonstrated left bundle PVCs axis not available; echocardiogram demonstrated normal LV function tests transition V4  She had symptoms also of lightheadedness. She's had some atypical chest pain and underwent Myoview scanning which demonstrated no ischemia. There is a comment about PVCs but I can't find the strips.  She has noted no change in exercise tolerance. She has occasional peripheral edema but no nocturnal dyspnea. She has had no syncope. She has decreased her caffeine intake.  Thyroid function testing and hemoglobin were normal at hospital  These records were reviewed      Past Medical History  Diagnosis Date  . Hypertension   . Gout 2011  . Cigarette nicotine dependence       Surgical History:  Past Surgical History  Procedure Laterality Date  . Neck surgery    . Tubal ligation       Home Meds: Prior to Admission medications   Medication Sig Start Date End Date Taking? Authorizing Provider  amLODipine (NORVASC) 5 MG tablet Take 1 tablet (5 mg total) by mouth daily. 06/24/14  Yes Brunetta Jeans, PA-C  aspirin EC 81 MG EC tablet Take 1 tablet (81 mg total) by mouth daily. 10/23/14  Yes Janece Canterbury, MD  Cyanocobalamin (VITAMIN B-12) 2500 MCG SUBL Place 5,000 mcg under the tongue daily.   Yes Historical Provider, MD  metoprolol tartrate (LOPRESSOR) 25 MG tablet Take 1  tablet (25 mg total) by mouth 2 (two) times daily. 10/23/14  Yes Janece Canterbury, MD  naproxen sodium (ANAPROX) 220 MG tablet Take 660 mg by mouth daily as needed (headache).   Yes Historical Provider, MD  nitroGLYCERIN (NITROSTAT) 0.4 MG SL tablet Place 1 tablet (0.4 mg total) under the tongue every 5 (five) minutes x 3 doses as needed for chest pain. 10/23/14  Yes Janece Canterbury, MD      Allergies:  Allergies  Allergen Reactions  . Lisinopril Hives and Swelling    History   Social History  . Marital Status: Married    Spouse Name: N/A  . Number of Children: N/A  . 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 Smoking status: Current Every Day Smoker -- 1.00 packs/day for 31 years    Types: Cigarettes  . Smokeless tobacco: Never Used  . Alcohol Use: No  . Drug Use: No  . Sexual Activity:    Partners: Male   Other Topics Concern  . Not on file   Social History Narrative   Lives in [place] with family.     Family History  Problem Relation Age of Onset  . Diabetes Mother   . Hypertension Mother   . Cancer Father 34    oral  . Diabetes Sister   . Hypertension Sister   . Diabetes Brother   . Hypertension Brother   . Sudden Cardiac Death Neg Hx   .  Heart attack Neg Hx      ROS:  Please see the history of present illness.     All other systems reviewed and negative.    Physical Exam: Blood pressure 124/76, pulse 87, height 5' 1.75" (1.568 m), weight 167 lb 3.2 oz (75.841 kg), last menstrual period 10/11/2014. General: Well developed, well nourished female in no acute distress. Head: Normocephalic, atraumatic, sclera non-icteric, no xanthomas, nares are without discharge. EENT: normal Lymph Nodes:  none Back: without scoliosis/kyphosis, no CVA tendersness Neck: Negative for carotid bruits. JVD not elevated. Lungs: Clear bilaterally to auscultation without wheezes, rales, or rhonchi. Breathing is  unlabored. Heart: RRR with S1 S2. 2/6 systolic murmur , rubs, or gallops appreciated. Abdomen: Soft, non-tender, non-distended with normoactive bowel sounds. No hepatomegaly. No rebound/guarding. No obvious abdominal masses. Msk:  Strength and tone appear normal for age. Extremities: No clubbing or cyanosis. No edema.  Distal pedal pulses are 2+ and equal bilaterally. Skin: Warm and Dry Neuro: Alert and oriented X 3. CN III-XII intact Grossly normal sensory and motor function . Psych:  Responds to questions appropriately with a normal affect.      Labs: Cardiac Enzymes No results for input(s): CKTOTAL, CKMB, TROPONINI in the last 72 hours. CBC Lab Results  Component Value Date   WBC 8.5 10/23/2014   HGB 12.5 10/23/2014   HCT 38.0 10/23/2014   MCV 85.0 10/23/2014   PLT 411* 10/23/2014   PROTIME: No results for input(s): LABPROT, INR in the last 72 hours. Chemistry  Recent Labs Lab 10/23/14 0333  NA 138  K 4.0  CL 107  CO2 24  BUN 10  CREATININE 0.54  CALCIUM 9.1  GLUCOSE 106*   Lipids Lab Results  Component Value Date   CHOL 156 07/30/2014   HDL 40.60 07/30/2014   LDLCALC 104* 07/30/2014   TRIG 59.0 07/30/2014   BNP No results found for: PROBNP Thyroid Function Tests: No results for input(s): TSH, T4TOTAL, T3FREE, THYROIDAB in the last 72 hours.  Invalid input(s): FREET3    Miscellaneous Lab Results  Component Value Date   DDIMER <0.27 10/22/2014    Radiology/Studies:  Dg Chest 2 View  10/22/2014   CLINICAL DATA:  Intermittent chest pain for 1 month. Increasing frequency. Heart palpitations.  EXAM: CHEST - 2 VIEW  COMPARISON:  None.  FINDINGS: Heart size a is normal. Mild parahilar prominence of the interstitium is likely related to smoking. No focal airspace disease is present. There is no edema or effusion to suggest failure. Surgical changes are noted at the cervical spine. The visualized soft tissues and bony thorax are otherwise unremarkable.   IMPRESSION: 1. Mild infrahilar interstitial coarsening bilaterally is likely related to smoking. 2. No acute cardiopulmonary disease.   Electronically Signed   By: San Morelle M.D.   On: 10/22/2014 08:43    EKG: Sinus rhythm at 87 Intervals 15/07/37 Axis is 60 Poor R-wave progression Otherwise normal   Assessment and Plan:  PVCs  Atypical chest pain  Dizziness  Hypertension   The patient is somewhat better with the metoprolol. It is not clear to me, based on the event recorder which of her symptoms are related to the PVCs as she has some symptomatic episodes associated with sinus rhythm. When she does have PVCs they are relatively infrequent.  There is no evidence of structural heart disease either as a cause or as a consequence. Hence, we will direct our efforts towards symptomatic relief. We'll discontinue her amlodipine and increase her metoprolol  from 25--50 twice a day. She will continue on the event recorder. I'll have her follow-up with A S in about 4 weeks to be sure nothing else is seen.  Blood pressure is well-controlled.  Myoview was reviewed and was negative    Virl Axe \

## 2014-10-30 ENCOUNTER — Encounter: Payer: Self-pay | Admitting: Physician Assistant

## 2014-10-30 ENCOUNTER — Ambulatory Visit (INDEPENDENT_AMBULATORY_CARE_PROVIDER_SITE_OTHER): Payer: BLUE CROSS/BLUE SHIELD | Admitting: Physician Assistant

## 2014-10-30 VITALS — BP 140/90 | HR 78 | Temp 98.2°F | Ht 61.75 in | Wt 166.8 lb

## 2014-10-30 DIAGNOSIS — F430 Acute stress reaction: Principal | ICD-10-CM

## 2014-10-30 DIAGNOSIS — I493 Ventricular premature depolarization: Secondary | ICD-10-CM | POA: Diagnosis not present

## 2014-10-30 DIAGNOSIS — F419 Anxiety disorder, unspecified: Secondary | ICD-10-CM | POA: Diagnosis not present

## 2014-10-30 DIAGNOSIS — F411 Generalized anxiety disorder: Secondary | ICD-10-CM

## 2014-10-30 MED ORDER — ALPRAZOLAM 0.25 MG PO TABS: 0.2500 mg | ORAL_TABLET | Freq: Three times a day (TID) | ORAL | Status: DC

## 2014-10-30 NOTE — Patient Instructions (Signed)
Please continue BP medications as directed. Avoid caffeine and stay well hydrated. Eat a well-balanced diet.   Take the Xanax up to three times daily as needed for acute anxiety. Follow-up with me in 2 weeks.

## 2014-10-30 NOTE — Progress Notes (Signed)
Pre visit review using our clinic review tool, if applicable. No additional management support is needed unless otherwise documented below in the visit note. 

## 2014-11-01 DIAGNOSIS — Z0279 Encounter for issue of other medical certificate: Secondary | ICD-10-CM

## 2014-11-04 ENCOUNTER — Telehealth: Payer: Self-pay | Admitting: *Deleted

## 2014-11-04 NOTE — Telephone Encounter (Signed)
Pt dropped off FMLA forms. Forms completed as much as possible and forwarded to [place]. JG//CMA

## 2014-11-04 NOTE — Progress Notes (Signed)
Patient presents to clinic today for hospital follo/w-up of chest pain with PVCs. Patient was seen in the ER on 10/22/14 for symptomatic palpitations. Was subsequently admitted to the hospital for observation and further evaluation. Labs, Echocardiogram unremarkable. EKG with PVCs.30day Holter monitor placed. Patient started on Metoprolol 12.5 mg BID and then increased to 25 mg BID. Was stabilized and discharged with recommendation for outpatient stress testing. Patient with outpatient follow-up with EP, having appointment yesterday. Metoprolol increased to 50 mg BID at that visit. Patient states symptoms are improved but she feels fatigued and has been very anxious about her symptoms. Is still wearing her Holter Monitor. Denies chest pain, lightheadedness or dizziness. Is still noting abnormal heartbeats but nowhere near as frequent or significant.  Past Medical History  Diagnosis Date  . Hypertension   . Gout 2011  . Cigarette nicotine dependence     Current Outpatient Prescriptions on File Prior to Visit  Medication Sig Dispense Refill  . aspirin EC 81 MG EC tablet Take 1 tablet (81 mg total) by mouth daily. 30 tablet 0  . Cyanocobalamin (VITAMIN B-12) 2500 MCG SUBL Place 5,000 mcg under the tongue daily.    . metoprolol tartrate (LOPRESSOR) 50 MG tablet Take 1 tablet (50 mg total) by mouth 2 (two) times daily. 60 tablet 6  . naproxen sodium (ANAPROX) 220 MG tablet Take 660 mg by mouth daily as needed (headache).    . nitroGLYCERIN (NITROSTAT) 0.4 MG SL tablet Place 1 tablet (0.4 mg total) under the tongue every 5 (five) minutes x 3 doses as needed for chest pain. 30 tablet 0   No current facility-administered medications on file prior to visit.    Allergies  Allergen Reactions  . Lisinopril Hives and Swelling    Family History  Problem Relation Age of Onset  . Diabetes Mother   . Hypertension Mother   . Cancer Father 37    oral  . Diabetes Sister   . Hypertension Sister   .  Diabetes Brother   . Hypertension Brother   . Sudden Cardiac Death Neg Hx   . Heart attack Neg Hx     Social History   Social History  . Marital Status: Married    Spouse Name: N/A  . Number of Children: N/A  . 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 Smoking status: Current Every Day Smoker -- 1.00 packs/day for 31 years    Types: Cigarettes  . Smokeless tobacco: Never Used  . Alcohol Use: No  . Drug Use: No  . Sexual Activity:    Partners: Male   Other Topics Concern  . None   Social History Narrative   Lives in [place] with family.    Review of Systems - See HPI.  All other ROS are negative.  BP 140/90 mmHg  Pulse 78  Temp(Src) 98.2 F (36.8 C) (Oral)  Ht 5' 1.75" (1.568 m)  Wt 166 lb 12.8 oz (75.66 kg)  BMI 30.77 kg/m2  SpO2 99%  LMP 10/19/2014  Physical Exam  Constitutional: She is oriented to person, place, and time and well-developed, well-nourished, and in no distress.  HENT:  Head: Normocephalic and atraumatic.  Eyes: Conjunctivae are normal.  Cardiovascular: Normal rate, normal heart sounds and intact distal pulses.   Pulmonary/Chest: Effort normal and breath sounds normal. No respiratory distress. She has no wheezes. She has no rales. She exhibits no tenderness.  Neurological: She is  alert and oriented to person, place, and time.  Skin: Skin is warm and dry. No rash noted.  Psychiatric: Her mood appears anxious.  Vitals reviewed.   Recent Results (from the past 2160 hour(s))  Protime-INR     Status: None   Collection Time: 10/22/14  8:41 AM  Result Value Ref Range   Prothrombin Time 13.7 11.6 - 15.2 seconds   INR 1.03 0.00 - 1.49  CBC with Differential/Platelet     Status: None   Collection Time: 10/22/14  8:41 AM  Result Value Ref Range   WBC 6.8 4.0 - 10.5 K/uL   RBC 4.45 3.87 - 5.11 MIL/uL   Hemoglobin 12.4 12.0 - 15.0 g/dL   HCT 37.0 36.0 - 46.0 %   MCV 83.1 78.0 - 100.0 fL    MCH 27.9 26.0 - 34.0 pg   MCHC 33.5 30.0 - 36.0 g/dL   RDW 13.9 11.5 - 15.5 %   Platelets 386 150 - 400 K/uL   Neutrophils Relative % 46 43 - 77 %   Neutro Abs 3.2 1.7 - 7.7 K/uL   Lymphocytes Relative 46 12 - 46 %   Lymphs Abs 3.1 0.7 - 4.0 K/uL   Monocytes Relative 6 3 - 12 %   Monocytes Absolute 0.4 0.1 - 1.0 K/uL   Eosinophils Relative 2 0 - 5 %   Eosinophils Absolute 0.2 0.0 - 0.7 K/uL   Basophils Relative 0 0 - 1 %   Basophils Absolute 0.0 0.0 - 0.1 K/uL  Magnesium     Status: None   Collection Time: 10/22/14  8:41 AM  Result Value Ref Range   Magnesium 1.8 1.7 - 2.4 mg/dL  Comprehensive metabolic panel     Status: Abnormal   Collection Time: 10/22/14  8:41 AM  Result Value Ref Range   Sodium 137 135 - 145 mmol/L   Potassium 3.6 3.5 - 5.1 mmol/L   Chloride 105 101 - 111 mmol/L   CO2 23 22 - 32 mmol/L   Glucose, Bld 96 65 - 99 mg/dL   BUN 10 6 - 20 mg/dL   Creatinine, Ser 0.54 0.44 - 1.00 mg/dL   Calcium 9.3 8.9 - 10.3 mg/dL   Total Protein 7.6 6.5 - 8.1 g/dL   Albumin 4.2 3.5 - 5.0 g/dL   AST 19 15 - 41 U/L   ALT 13 (L) 14 - 54 U/L   Alkaline Phosphatase 75 38 - 126 U/L   Total Bilirubin 0.4 0.3 - 1.2 mg/dL   GFR calc non Af Amer >60 >60 mL/min   GFR calc Af Amer >60 >60 mL/min    Comment: (NOTE) The eGFR has been calculated using the CKD EPI equation. This calculation has not been validated in all clinical situations. eGFR's persistently <60 mL/min signify possible Chronic Kidney Disease.    Anion gap 9 5 - 15  TSH     Status: None   Collection Time: 10/22/14  8:47 AM  Result Value Ref Range   TSH 0.659 0.350 - 4.500 uIU/mL  I-stat troponin, ED (0, 3, 6)  not at [facility]     Status: None   Collection Time: 10/22/14  8:59 AM  Result Value Ref Range   Troponin i, poc 0.00 0.00 - 0.08 ng/mL   Comment 3            Comment: Due to the release kinetics of cTnI, a negative result within the first hours of the onset of symptoms does not rule  out myocardial  infarction with certainty. If myocardial infarction is still suspected, repeat the test at appropriate intervals.   Urinalysis, Routine w reflex microscopic (not at [facility])     Status: Abnormal   Collection Time: 10/22/14  9:22 AM  Result Value Ref Range   Color, Urine YELLOW YELLOW   APPearance CLOUDY (A) CLEAR   Specific Gravity, Urine 1.014 1.005 - 1.030   pH 7.0 5.0 - 8.0   Glucose, UA NEGATIVE NEGATIVE mg/dL   Hgb urine dipstick MODERATE (A) NEGATIVE   Bilirubin Urine NEGATIVE NEGATIVE   Ketones, ur NEGATIVE NEGATIVE mg/dL   Protein, ur NEGATIVE NEGATIVE mg/dL   Urobilinogen, UA 0.2 0.0 - 1.0 mg/dL   Nitrite NEGATIVE NEGATIVE   Leukocytes, UA NEGATIVE NEGATIVE  Urine rapid drug screen (hosp performed)     Status: Abnormal   Collection Time: 10/22/14  9:22 AM  Result Value Ref Range   Opiates POSITIVE (A) NONE DETECTED   Cocaine NONE DETECTED NONE DETECTED   Benzodiazepines NONE DETECTED NONE DETECTED   Amphetamines NONE DETECTED NONE DETECTED   Tetrahydrocannabinol NONE DETECTED NONE DETECTED   Barbiturates NONE DETECTED NONE DETECTED    Comment:        DRUG SCREEN FOR MEDICAL PURPOSES ONLY.  IF CONFIRMATION IS NEEDED FOR ANY PURPOSE, NOTIFY LAB WITHIN 5 DAYS.        LOWEST DETECTABLE LIMITS FOR URINE DRUG SCREEN Drug Class       Cutoff (ng/mL) Amphetamine      1000 Barbiturate      200 Benzodiazepine   248 Tricyclics       250 Opiates          300 Cocaine          300 THC              50   Urine microscopic-add on     Status: None   Collection Time: 10/22/14  9:22 AM  Result Value Ref Range   Squamous Epithelial / LPF RARE RARE   WBC, UA 0-2 <3 WBC/hpf   RBC / HPF 11-20 <3 RBC/hpf   Bacteria, UA RARE RARE   Urine-Other MUCOUS PRESENT   POC urine preg, ED (not at [facility])     Status: None   Collection Time: 10/22/14  9:38 AM  Result Value Ref Range   Preg Test, Ur NEGATIVE NEGATIVE    Comment:        THE SENSITIVITY OF THIS METHODOLOGY IS >24 mIU/mL     I-stat troponin, ED (0, 3, 6)  not at [facility]     Status: None   Collection Time: 10/22/14 11:40 AM  Result Value Ref Range   Troponin i, poc 0.00 0.00 - 0.08 ng/mL   Comment 3            Comment: Due to the release kinetics of cTnI, a negative result within the first hours of the onset of symptoms does not rule out myocardial infarction with certainty. If myocardial infarction is still suspected, repeat the test at appropriate intervals.   Troponin I     Status: None   Collection Time: 10/22/14  2:13 PM  Result Value Ref Range   Troponin I <0.03 <0.031 ng/mL    Comment:        NO INDICATION OF MYOCARDIAL INJURY.   D-dimer, quantitative (not at [facility])     Status: None   Collection Time: 10/22/14  2:13 PM  Result Value Ref Range   D-Dimer,  Quant <0.27 0.00 - 0.48 ug/mL-FEU    Comment:        AT THE INHOUSE ESTABLISHED CUTOFF VALUE OF 0.48 ug/mL FEU, THIS ASSAY HAS BEEN DOCUMENTED IN THE LITERATURE TO HAVE A SENSITIVITY AND NEGATIVE PREDICTIVE VALUE OF AT LEAST 98 TO 99%.  THE TEST RESULT SHOULD BE CORRELATED WITH AN ASSESSMENT OF THE CLINICAL PROBABILITY OF DVT / VTE.   MRSA PCR Screening     Status: None   Collection Time: 10/22/14  4:09 PM  Result Value Ref Range   MRSA by PCR NEGATIVE NEGATIVE    Comment:        The GeneXpert MRSA Assay (FDA approved for NASAL specimens only), is one component of a comprehensive MRSA colonization surveillance program. It is not intended to diagnose MRSA infection nor to guide or monitor treatment for MRSA infections. Performed at [place] metabolic panel     Status: Abnormal   Collection Time: 10/23/14  3:33 AM  Result Value Ref Range   Sodium 138 135 - 145 mmol/L   Potassium 4.0 3.5 - 5.1 mmol/L   Chloride 107 101 - 111 mmol/L   CO2 24 22 - 32 mmol/L   Glucose, Bld 106 (H) 65 - 99 mg/dL   BUN 10 6 - 20 mg/dL   Creatinine, Ser 0.54 0.44 - 1.00 mg/dL   Calcium 9.1 8.9 - 10.3 mg/dL   GFR calc non Af  Amer >60 >60 mL/min   GFR calc Af Amer >60 >60 mL/min    Comment: (NOTE) The eGFR has been calculated using the CKD EPI equation. This calculation has not been validated in all clinical situations. eGFR's persistently <60 mL/min signify possible Chronic Kidney Disease.    Anion gap 7 5 - 15  CBC     Status: Abnormal   Collection Time: 10/23/14  3:33 AM  Result Value Ref Range   WBC 8.5 4.0 - 10.5 K/uL    Comment: WHITE COUNT CONFIRMED ON SMEAR   RBC 4.47 3.87 - 5.11 MIL/uL   Hemoglobin 12.5 12.0 - 15.0 g/dL   HCT 38.0 36.0 - 46.0 %   MCV 85.0 78.0 - 100.0 fL   MCH 28.0 26.0 - 34.0 pg   MCHC 32.9 30.0 - 36.0 g/dL   RDW 14.3 11.5 - 15.5 %   Platelets 411 (H) 150 - 400 K/uL  Myocardial Perfusion Imaging     Status: None   Collection Time: 10/28/14 12:11 PM  Result Value Ref Range   Rest HR 73 bpm   Rest BP 143/100 mmHg   Exercise duration (min) 9 min   Exercise duration (sec)  sec   Estimated workload 10.1 METS   Post peak HR 155 bpm   Post peak BP 168/97 mmHg   MPHR 175 bpm   Percent HR 88 %   RPE 16    LV Systolic Volume 36 mL   TID 5.37    LV Diastolic Volume 90 mL   LHR 0.34    SSS 1    SRS 0    SDS 1     Assessment/Plan: Symptomatic PVCs Continue metoprolol 50 mg BID. Continue holter monitor. Follow-up with EP as scheduled. Anxious about symptoms which is affecting rest and exacerbating symptoms. Rx Xanax 0.25 mg TID. Follow-up 1 month.

## 2014-11-04 NOTE — Assessment & Plan Note (Signed)
Continue metoprolol 50 mg BID. Continue holter monitor. Follow-up with EP as scheduled. Anxious about symptoms which is affecting rest and exacerbating symptoms. Rx Xanax 0.25 mg TID. Follow-up 1 month.

## 2014-11-05 ENCOUNTER — Telehealth: Payer: Self-pay | Admitting: Physician Assistant

## 2014-11-05 NOTE — Telephone Encounter (Signed)
Completed forms faxed to [facility] at [phone]. Originals placed up front for pt to pick up, informed pt. Copy sent for scanning. JG//CMA

## 2014-11-05 NOTE — Telephone Encounter (Signed)
Pt called back. Advised pt of previous note. Pt express understanding.

## 2014-11-05 NOTE — Telephone Encounter (Signed)
error 

## 2014-11-08 ENCOUNTER — Telehealth: Payer: Self-pay | Admitting: Physician Assistant

## 2014-11-08 NOTE — Telephone Encounter (Signed)
Error/gd °

## 2014-11-11 ENCOUNTER — Other Ambulatory Visit: Payer: Self-pay

## 2014-11-11 ENCOUNTER — Telehealth: Payer: Self-pay | Admitting: *Deleted

## 2014-11-11 DIAGNOSIS — Z0279 Encounter for issue of other medical certificate: Secondary | ICD-10-CM

## 2014-11-11 MED ORDER — METOPROLOL TARTRATE 50 MG PO TABS: 50.0000 mg | ORAL_TABLET | Freq: Two times a day (BID) | ORAL | Status: DC

## 2014-11-11 NOTE — Telephone Encounter (Signed)
Ordered Medications       Disp Refills Start End    metoprolol tartrate (LOPRESSOR) 50 MG tablet 60 tablet 6 10/29/2014     Take 1 tablet (50 mg total) by mouth 2 (two) times daily. - Oral    Notes to Pharmacy: Increased dosage

## 2014-11-11 NOTE — Telephone Encounter (Signed)
[name], MD at 10/29/2014 1:43 PM   Patient Instructions     Medication Instructions:  Your physician has recommended you make the following change in your medication:  1) STOP Amlodipine 2) INCREASE Metoprolol to 50 mg twice a day   Medication Detail      Disp Refills Start End     metoprolol tartrate (LOPRESSOR) 50 MG tablet 60 tablet 6 10/29/2014     Sig - Route: Take 1 tablet (50 mg total) by mouth 2 (two) times daily. - Oral    Class: No Print    Notes to Pharmacy: Increased dosage       Refill that was sent in during office visit did not go through because it was sent as "No Print"

## 2014-11-11 NOTE — Telephone Encounter (Signed)
Restrictions form received via fax from [facility]. Form filled out as much as possible and forwarded to [place]. JG//CMA

## 2014-11-13 ENCOUNTER — Ambulatory Visit (INDEPENDENT_AMBULATORY_CARE_PROVIDER_SITE_OTHER): Payer: BLUE CROSS/BLUE SHIELD | Admitting: Physician Assistant

## 2014-11-13 ENCOUNTER — Encounter: Payer: Self-pay | Admitting: Physician Assistant

## 2014-11-13 VITALS — BP 124/86 | HR 83 | Temp 98.3°F | Resp 16 | Ht 61.75 in | Wt 171.0 lb

## 2014-11-13 DIAGNOSIS — I493 Ventricular premature depolarization: Secondary | ICD-10-CM | POA: Diagnosis not present

## 2014-11-13 NOTE — Assessment & Plan Note (Signed)
Improving. Is taking her Metoprolol as directed. Xanax has helped both with palpitations and anxiety. Is feeling much better. Continue current regimen. Finish current Holter study. Follow-up with EP and Cardiology as scheduled.

## 2014-11-13 NOTE — Telephone Encounter (Signed)
Forms along with OV notes faxed to [facility] at [phone] successfully. Sent for scanning. JG//CMA

## 2014-11-13 NOTE — Progress Notes (Signed)
Patient presents to clinic today for follow-up of symptomatic palpitations and anxiety after EP increased Metoprolol dose and she was started on Xanax for panic attack by this provider. Patient endorses feeling markedly better. Is still wearing her 30-day Holter monitor but has not had to press the alert button in several days. Has scheduled follow-up with EP. Denies chest pain, lightheadedness, dizziness or shortness of breath.  Past Medical History  Diagnosis Date  . Hypertension   . Gout 2011  . Cigarette nicotine dependence     Current Outpatient Prescriptions on File Prior to Visit  Medication Sig Dispense Refill  . ALPRAZolam (XANAX) 0.25 MG tablet Take 1 tablet (0.25 mg total) by mouth 3 (three) times daily. 90 tablet 0  . aspirin EC 81 MG EC tablet Take 1 tablet (81 mg total) by mouth daily. 30 tablet 0  . Cyanocobalamin (VITAMIN B-12) 2500 MCG SUBL Place 5,000 mcg under the tongue daily.    . metoprolol (LOPRESSOR) 50 MG tablet Take 1 tablet (50 mg total) by mouth 2 (two) times daily. 180 tablet 1  . naproxen sodium (ANAPROX) 220 MG tablet Take 660 mg by mouth daily as needed (headache).    . nitroGLYCERIN (NITROSTAT) 0.4 MG SL tablet Place 1 tablet (0.4 mg total) under the tongue every 5 (five) minutes x 3 doses as needed for chest pain. 30 tablet 0   No current facility-administered medications on file prior to visit.    Allergies  Allergen Reactions  . Lisinopril Hives and Swelling    Family History  Problem Relation Age of Onset  . Diabetes Mother   . Hypertension Mother   . Cancer Father 12    oral  . Diabetes Sister   . Hypertension Sister   . Diabetes Brother   . Hypertension Brother   . Sudden Cardiac Death Neg Hx   . Heart attack Neg Hx     Social History   Social History  . Marital Status: Married    Spouse Name: N/A  . Number of Children: N/A  . 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  Smoking status: Current Every Day Smoker -- 1.00 packs/day for 31 years    Types: Cigarettes  . Smokeless tobacco: Never Used  . Alcohol Use: No  . Drug Use: No  . Sexual Activity:    Partners: Male   Other Topics Concern  . None   Social History Narrative   Lives in [place] with family.    Review of Systems - See HPI.  All other ROS are negative.  BP 124/86 mmHg  Pulse 83  Temp(Src) 98.3 F (36.8 C) (Oral)  Resp 16  Ht 5' 1.75" (1.568 m)  Wt 171 lb (77.565 kg)  BMI 31.55 kg/m2  SpO2 98%  LMP 10/19/2014  Physical Exam  Constitutional: She is oriented to person, place, and time and well-developed, well-nourished, and in no distress.  HENT:  Head: Normocephalic and atraumatic.  Eyes: Conjunctivae are normal.  Cardiovascular: Normal rate, regular rhythm, normal heart sounds and intact distal pulses.   Pulmonary/Chest: Effort normal and breath sounds normal. No respiratory distress. She has no wheezes. She has no rales. She exhibits no tenderness.  Neurological: She is alert and oriented to person, place, and time.  Skin: Skin is warm and dry. No rash noted.  Psychiatric: Affect normal.  Vitals reviewed.   Recent Results (from the past 2160 hour(s))  Protime-INR  Status: None   Collection Time: 10/22/14  8:41 AM  Result Value Ref Range   Prothrombin Time 13.7 11.6 - 15.2 seconds   INR 1.03 0.00 - 1.49  CBC with Differential/Platelet     Status: None   Collection Time: 10/22/14  8:41 AM  Result Value Ref Range   WBC 6.8 4.0 - 10.5 K/uL   RBC 4.45 3.87 - 5.11 MIL/uL   Hemoglobin 12.4 12.0 - 15.0 g/dL   HCT 37.0 36.0 - 46.0 %   MCV 83.1 78.0 - 100.0 fL   MCH 27.9 26.0 - 34.0 pg   MCHC 33.5 30.0 - 36.0 g/dL   RDW 13.9 11.5 - 15.5 %   Platelets 386 150 - 400 K/uL   Neutrophils Relative % 46 43 - 77 %   Neutro Abs 3.2 1.7 - 7.7 K/uL   Lymphocytes Relative 46 12 - 46 %   Lymphs Abs 3.1 0.7 - 4.0 K/uL   Monocytes Relative 6 3 - 12 %   Monocytes Absolute 0.4  0.1 - 1.0 K/uL   Eosinophils Relative 2 0 - 5 %   Eosinophils Absolute 0.2 0.0 - 0.7 K/uL   Basophils Relative 0 0 - 1 %   Basophils Absolute 0.0 0.0 - 0.1 K/uL  Magnesium     Status: None   Collection Time: 10/22/14  8:41 AM  Result Value Ref Range   Magnesium 1.8 1.7 - 2.4 mg/dL  Comprehensive metabolic panel     Status: Abnormal   Collection Time: 10/22/14  8:41 AM  Result Value Ref Range   Sodium 137 135 - 145 mmol/L   Potassium 3.6 3.5 - 5.1 mmol/L   Chloride 105 101 - 111 mmol/L   CO2 23 22 - 32 mmol/L   Glucose, Bld 96 65 - 99 mg/dL   BUN 10 6 - 20 mg/dL   Creatinine, Ser 0.54 0.44 - 1.00 mg/dL   Calcium 9.3 8.9 - 10.3 mg/dL   Total Protein 7.6 6.5 - 8.1 g/dL   Albumin 4.2 3.5 - 5.0 g/dL   AST 19 15 - 41 U/L   ALT 13 (L) 14 - 54 U/L   Alkaline Phosphatase 75 38 - 126 U/L   Total Bilirubin 0.4 0.3 - 1.2 mg/dL   GFR calc non Af Amer >60 >60 mL/min   GFR calc Af Amer >60 >60 mL/min    Comment: (NOTE) The eGFR has been calculated using the CKD EPI equation. This calculation has not been validated in all clinical situations. eGFR's persistently <60 mL/min signify possible Chronic Kidney Disease.    Anion gap 9 5 - 15  TSH     Status: None   Collection Time: 10/22/14  8:47 AM  Result Value Ref Range   TSH 0.659 0.350 - 4.500 uIU/mL  I-stat troponin, ED (0, 3, 6)  not at [facility]     Status: None   Collection Time: 10/22/14  8:59 AM  Result Value Ref Range   Troponin i, poc 0.00 0.00 - 0.08 ng/mL   Comment 3            Comment: Due to the release kinetics of cTnI, a negative result within the first hours of the onset of symptoms does not rule out myocardial infarction with certainty. If myocardial infarction is still suspected, repeat the test at appropriate intervals.   Urinalysis, Routine w reflex microscopic (not at [facility])     Status: Abnormal   Collection Time: 10/22/14  9:22 AM  Result Value Ref Range   Color, Urine YELLOW YELLOW   APPearance CLOUDY (A)  CLEAR   Specific Gravity, Urine 1.014 1.005 - 1.030   pH 7.0 5.0 - 8.0   Glucose, UA NEGATIVE NEGATIVE mg/dL   Hgb urine dipstick MODERATE (A) NEGATIVE   Bilirubin Urine NEGATIVE NEGATIVE   Ketones, ur NEGATIVE NEGATIVE mg/dL   Protein, ur NEGATIVE NEGATIVE mg/dL   Urobilinogen, UA 0.2 0.0 - 1.0 mg/dL   Nitrite NEGATIVE NEGATIVE   Leukocytes, UA NEGATIVE NEGATIVE  Urine rapid drug screen (hosp performed)     Status: Abnormal   Collection Time: 10/22/14  9:22 AM  Result Value Ref Range   Opiates POSITIVE (A) NONE DETECTED   Cocaine NONE DETECTED NONE DETECTED   Benzodiazepines NONE DETECTED NONE DETECTED   Amphetamines NONE DETECTED NONE DETECTED   Tetrahydrocannabinol NONE DETECTED NONE DETECTED   Barbiturates NONE DETECTED NONE DETECTED    Comment:        DRUG SCREEN FOR MEDICAL PURPOSES ONLY.  IF CONFIRMATION IS NEEDED FOR ANY PURPOSE, NOTIFY LAB WITHIN 5 DAYS.        LOWEST DETECTABLE LIMITS FOR URINE DRUG SCREEN Drug Class       Cutoff (ng/mL) Amphetamine      1000 Barbiturate      200 Benzodiazepine   754 Tricyclics       492 Opiates          300 Cocaine          300 THC              50   Urine microscopic-add on     Status: None   Collection Time: 10/22/14  9:22 AM  Result Value Ref Range   Squamous Epithelial / LPF RARE RARE   WBC, UA 0-2 <3 WBC/hpf   RBC / HPF 11-20 <3 RBC/hpf   Bacteria, UA RARE RARE   Urine-Other MUCOUS PRESENT   POC urine preg, ED (not at [facility])     Status: None   Collection Time: 10/22/14  9:38 AM  Result Value Ref Range   Preg Test, Ur NEGATIVE NEGATIVE    Comment:        THE SENSITIVITY OF THIS METHODOLOGY IS >24 mIU/mL   I-stat troponin, ED (0, 3, 6)  not at [facility]     Status: None   Collection Time: 10/22/14 11:40 AM  Result Value Ref Range   Troponin i, poc 0.00 0.00 - 0.08 ng/mL   Comment 3            Comment: Due to the release kinetics of cTnI, a negative result within the first hours of the onset of symptoms does not  rule out myocardial infarction with certainty. If myocardial infarction is still suspected, repeat the test at appropriate intervals.   Troponin I     Status: None   Collection Time: 10/22/14  2:13 PM  Result Value Ref Range   Troponin I <0.03 <0.031 ng/mL    Comment:        NO INDICATION OF MYOCARDIAL INJURY.   D-dimer, quantitative (not at [facility])     Status: None   Collection Time: 10/22/14  2:13 PM  Result Value Ref Range   D-Dimer, Quant <0.27 0.00 - 0.48 ug/mL-FEU    Comment:        AT THE INHOUSE ESTABLISHED CUTOFF VALUE OF 0.48 ug/mL FEU, THIS ASSAY HAS BEEN DOCUMENTED IN THE LITERATURE TO HAVE A SENSITIVITY AND NEGATIVE PREDICTIVE VALUE  OF AT LEAST 98 TO 99%.  THE TEST RESULT SHOULD BE CORRELATED WITH AN ASSESSMENT OF THE CLINICAL PROBABILITY OF DVT / VTE.   MRSA PCR Screening     Status: None   Collection Time: 10/22/14  4:09 PM  Result Value Ref Range   MRSA by PCR NEGATIVE NEGATIVE    Comment:        The GeneXpert MRSA Assay (FDA approved for NASAL specimens only), is one component of a comprehensive MRSA colonization surveillance program. It is not intended to diagnose MRSA infection nor to guide or monitor treatment for MRSA infections. Performed at [facility] metabolic panel     Status: Abnormal   Collection Time: 10/23/14  3:33 AM  Result Value Ref Range   Sodium 138 135 - 145 mmol/L   Potassium 4.0 3.5 - 5.1 mmol/L   Chloride 107 101 - 111 mmol/L   CO2 24 22 - 32 mmol/L   Glucose, Bld 106 (H) 65 - 99 mg/dL   BUN 10 6 - 20 mg/dL   Creatinine, Ser 0.54 0.44 - 1.00 mg/dL   Calcium 9.1 8.9 - 10.3 mg/dL   GFR calc non Af Amer >60 >60 mL/min   GFR calc Af Amer >60 >60 mL/min    Comment: (NOTE) The eGFR has been calculated using the CKD EPI equation. This calculation has not been validated in all clinical situations. eGFR's persistently <60 mL/min signify possible Chronic Kidney Disease.    Anion gap 7 5 - 15  CBC     Status:  Abnormal   Collection Time: 10/23/14  3:33 AM  Result Value Ref Range   WBC 8.5 4.0 - 10.5 K/uL    Comment: WHITE COUNT CONFIRMED ON SMEAR   RBC 4.47 3.87 - 5.11 MIL/uL   Hemoglobin 12.5 12.0 - 15.0 g/dL   HCT 38.0 36.0 - 46.0 %   MCV 85.0 78.0 - 100.0 fL   MCH 28.0 26.0 - 34.0 pg   MCHC 32.9 30.0 - 36.0 g/dL   RDW 14.3 11.5 - 15.5 %   Platelets 411 (H) 150 - 400 K/uL  Myocardial Perfusion Imaging     Status: None   Collection Time: 10/28/14 12:11 PM  Result Value Ref Range   Rest HR 73 bpm   Rest BP 143/100 mmHg   Exercise duration (min) 9 min   Exercise duration (sec)  sec   Estimated workload 10.1 METS   Peak HR 155 bpm   Peak BP 168/97 mmHg   MPHR 175 bpm   Percent HR 88 %   RPE 16    LV Systolic Volume 36 mL   TID 9.75    LV Diastolic Volume 90 mL   LHR 0.34    SSS 1    SRS 0    SDS 1     Assessment/Plan: Symptomatic PVCs Improving. Is taking her Metoprolol as directed. Xanax has helped both with palpitations and anxiety. Is feeling much better. Continue current regimen. Finish current Holter study. Follow-up with EP and Cardiology as scheduled.

## 2014-11-13 NOTE — Patient Instructions (Signed)
I am glad you are feeling much better! Please continue the current medication regimen. Follow-up with the specialist as scheduled. This will help [place] determine when your return to work date is.  Apply some Icy Hot to the neck to calm down muscle inflammation. Avoid heavy lifting.  Follow-up with me after appointment with specialist.

## 2014-11-13 NOTE — Progress Notes (Signed)
Pre visit review using our clinic review tool, if applicable. No additional management support is needed unless otherwise documented below in the visit note/SLS  

## 2014-12-02 ENCOUNTER — Telehealth: Payer: Self-pay | Admitting: Physician Assistant

## 2014-12-02 ENCOUNTER — Other Ambulatory Visit: Payer: Self-pay | Admitting: Physician Assistant

## 2014-12-02 DIAGNOSIS — F430 Acute stress reaction: Principal | ICD-10-CM

## 2014-12-02 DIAGNOSIS — F411 Generalized anxiety disorder: Secondary | ICD-10-CM

## 2014-12-02 MED ORDER — ALPRAZOLAM 0.25 MG PO TABS: 0.2500 mg | ORAL_TABLET | Freq: Three times a day (TID) | ORAL | Status: DC

## 2014-12-02 NOTE — Telephone Encounter (Signed)
Refill has been granted. Will be faxed to her pharmacy.

## 2014-12-02 NOTE — Telephone Encounter (Signed)
Rx faxed to pharmacy/[facility] 

## 2014-12-02 NOTE — Telephone Encounter (Signed)
Relation to pt: SELF  Call back Diamond:  Reason for call:  Patient requesting a refill ALPRAZolam (XANAX) 0.25 MG tablet

## 2014-12-04 ENCOUNTER — Ambulatory Visit (INDEPENDENT_AMBULATORY_CARE_PROVIDER_SITE_OTHER): Payer: BLUE CROSS/BLUE SHIELD | Admitting: Nurse Practitioner

## 2014-12-04 ENCOUNTER — Encounter: Payer: Self-pay | Admitting: Nurse Practitioner

## 2014-12-04 VITALS — BP 124/80 | HR 85 | Ht 61.0 in | Wt 174.8 lb

## 2014-12-04 DIAGNOSIS — I10 Essential (primary) hypertension: Secondary | ICD-10-CM

## 2014-12-04 DIAGNOSIS — I493 Ventricular premature depolarization: Secondary | ICD-10-CM

## 2014-12-04 NOTE — Patient Instructions (Signed)
Medication Instructions:   Your physician recommends that you continue on your current medications as directed. Please refer to the Current Medication list given to you today.  Labwork:  NONE ORDER TODAY    Testing/Procedures: NONE ORDER TODAY    Follow-Up:  Your physician wants you to follow-up in: [place].Marland KitchenYou will receive a reminder letter in the mail two months in advance. If you don't receive a letter, please call our office to schedule the follow-up appointment.    Any Other Special Instructions Will Be Listed Below (If Applicable).

## 2014-12-04 NOTE — Progress Notes (Signed)
Electrophysiology Office Note Date: 12/04/2014  ID:  Shakeita Vandevander, DOB 01/20/1971, MRN [id number]  PCP: Leeanne Rio, PA-C Electrophysiologist: Caryl Comes  CC: palpitations  Emma Stephens is a 44 y.o. female seen today for Dr Caryl Comes.  She was seen recently seen for palpitations and found to have symtpomatic PVC's. Her BB was increased and her PCP started her on Xanax with significant improvement in symptoms. She wore an event monitor which demonstrated PVC's during some symptoms, but SR during others.  She presents today for routine electrophysiology followup.  Since last being seen in our clinic, the patient reports doing reasonably well.  She has good days and bad days but overall her symptoms are significantly improved. She has increased stress at home. She has occasional chest tightness not associated with exertion and not relieved by rest.  She denies chest pain,, dyspnea, PND, orthopnea, nausea, vomiting, dizziness, syncope.  Myoview 10/2014 demonstrated normal EF and no infarct or ischemia  Past Medical History  Diagnosis Date  . Hypertension   . Gout 2011  . Cigarette nicotine dependence    Past Surgical History  Procedure Laterality Date  . Neck surgery    . Tubal ligation      Current Outpatient Prescriptions  Medication Sig Dispense Refill  . ALPRAZolam (XANAX) 0.25 MG tablet Take 1 tablet (0.25 mg total) by mouth 3 (three) times daily. 90 tablet 0  . aspirin EC 81 MG EC tablet Take 1 tablet (81 mg total) by mouth daily. 30 tablet 0  . Cyanocobalamin (VITAMIN B-12) 2500 MCG SUBL Place 5,000 mcg under the tongue daily.    . metoprolol (LOPRESSOR) 50 MG tablet Take 1 tablet (50 mg total) by mouth 2 (two) times daily. 180 tablet 1  . naproxen sodium (ANAPROX) 220 MG tablet Take 660 mg by mouth daily as needed (headache).    . nitroGLYCERIN (NITROSTAT) 0.4 MG SL tablet Place 1 tablet (0.4 mg total) under the tongue every 5 (five) minutes x 3 doses as needed for  chest pain. 30 tablet 0   No current facility-administered medications for this visit.    Allergies:   Lisinopril   Social History: Social History   Social History  . Marital Status: Married    Spouse Name: N/A  . Number of Children: N/A  . 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 Smoking status: Current Every Day Smoker -- 1.00 packs/day for 31 years    Types: Cigarettes  . Smokeless tobacco: Never Used  . Alcohol Use: No  . Drug Use: No  . Sexual Activity:    Partners: Male   Other Topics Concern  . Not on file   Social History Narrative   Lives in [place] with family.    Family History: Family History  Problem Relation Age of Onset  . Diabetes Mother   . Hypertension Mother   . Cancer Father 88    oral  . Diabetes Sister   . Hypertension Sister   . Diabetes Brother   . Hypertension Brother   . Sudden Cardiac Death Neg Hx   . Heart attack Neg Hx     Review of Systems: All other systems reviewed and are otherwise negative except as noted above.   Physical Exam: VS:  BP 124/80 mmHg  Pulse 85  Ht 5\' 1"  (1.549 m)  Wt 174 lb 12.8 oz (79.289 kg)  BMI 33.05 kg/m2  SpO2 97% ,  BMI Body mass index is 33.05 kg/(m^2). Wt Readings from Last 3 Encounters:  12/04/14 174 lb 12.8 oz (79.289 kg)  11/13/14 171 lb (77.565 kg)  10/30/14 166 lb 12.8 oz (75.66 kg)    GEN- The patient is obese appearing, alert and oriented x 3 today.   HEENT: normocephalic, atraumatic; sclera clear, conjunctiva pink; hearing intact; oropharynx clear; neck supple Lungs- Clear to ausculation bilaterally, normal work of breathing.  No wheezes, rales, rhonchi Heart- Regular rate and rhythm GI- soft, non-tender, non-distended, bowel sounds present Extremities- no clubbing, cyanosis, or edema; DP/PT/radial pulses 2+ bilaterally MS- no significant deformity or atrophy Skin- warm and dry, no rash or lesion  Psych- euthymic mood,  full affect Neuro- strength and sensation are intact   EKG:  EKG is not ordered today.  Recent Labs: 10/22/2014: ALT 13*; Magnesium 1.8; TSH 0.659 10/23/2014: BUN 10; Creatinine, Ser 0.54; Hemoglobin 12.5; Platelets 411*; Potassium 4.0; Sodium 138    Other studies Reviewed: Additional studies/ records that were reviewed today include: event monitor, Dr Olin Pia notes, PCP's notes  Assessment and Plan: 1.  PVC's The patient has documented symptomatic PVC's that have improved with higher doses of BB and Xanax.  Continue current plan. If she has recurrence despite therapy, could consider AAD therapy would avoid if possible. I have advised that she can take an extra 25mg  of Metoprolol as needed for days when her symptoms are worse We also discussed non-pharmacologic methods of improving stress today, specifically daily aerobic exercise.  She is willing to walk for 30 minutes daily.   2.  HTN Stable No change required today   Current medicines are reviewed at length with the patient today.   The patient does not have concerns regarding her medicines.  The following changes were made today:  none  Labs/ tests ordered today include: none    Disposition:   Follow up with me in 6 months    Signed, Chanetta Marshall, NP 12/04/2014 9:06 PM   [address] [place] [address] [phone] (office) [phone] (fax)

## 2014-12-11 ENCOUNTER — Telehealth: Payer: Self-pay | Admitting: Internal Medicine

## 2014-12-11 ENCOUNTER — Encounter: Payer: Self-pay | Admitting: *Deleted

## 2014-12-11 ENCOUNTER — Ambulatory Visit (INDEPENDENT_AMBULATORY_CARE_PROVIDER_SITE_OTHER): Payer: BLUE CROSS/BLUE SHIELD | Admitting: Physician Assistant

## 2014-12-11 ENCOUNTER — Encounter: Payer: Self-pay | Admitting: Physician Assistant

## 2014-12-11 VITALS — BP 112/90 | HR 89 | Temp 98.9°F | Resp 16 | Ht 61.0 in | Wt 173.2 lb

## 2014-12-11 DIAGNOSIS — I493 Ventricular premature depolarization: Secondary | ICD-10-CM | POA: Diagnosis not present

## 2014-12-11 DIAGNOSIS — F17213 Nicotine dependence, cigarettes, with withdrawal: Secondary | ICD-10-CM | POA: Diagnosis not present

## 2014-12-11 DIAGNOSIS — F411 Generalized anxiety disorder: Secondary | ICD-10-CM

## 2014-12-11 DIAGNOSIS — F418 Other specified anxiety disorders: Secondary | ICD-10-CM | POA: Insufficient documentation

## 2014-12-11 MED ORDER — BUPROPION HCL ER (SR) 150 MG PO TB12: 150.0000 mg | ORAL_TABLET | Freq: Two times a day (BID) | ORAL | Status: DC

## 2014-12-11 NOTE — Progress Notes (Signed)
Patient presents to clinic today c/o for follow-up of palpitations ans anxiety. Has follow-up with Cardiology yesterday. Was told to continue the Metoprolol as directed with instructions to take additional 1/2 tablet if heart was racing. Is following directions as directed. Is taking her Xanax as directed for acute anxiety. Is still dealing with generalized stress and anxiety. Would also like to discuss medication for smoking cessation.  Past Medical History  Diagnosis Date  . Hypertension   . Gout 2011  . Cigarette nicotine dependence     Current Outpatient Prescriptions on File Prior to Visit  Medication Sig Dispense Refill  . ALPRAZolam (XANAX) 0.25 MG tablet Take 1 tablet (0.25 mg total) by mouth 3 (three) times daily. 90 tablet 0  . aspirin EC 81 MG EC tablet Take 1 tablet (81 mg total) by mouth daily. 30 tablet 0  . Cyanocobalamin (VITAMIN B-12) 2500 MCG SUBL Place 5,000 mcg under the tongue daily.    . metoprolol (LOPRESSOR) 50 MG tablet Take 1 tablet (50 mg total) by mouth 2 (two) times daily. (Patient taking differently: Take 50 mg by mouth 2 (two) times daily. PATIENT CAN TAKE 0.5 TABLET DURING DAY IF NEEDED PER CARDIOLOGY.) 180 tablet 1  . naproxen sodium (ANAPROX) 220 MG tablet Take 660 mg by mouth daily as needed (headache).    . nitroGLYCERIN (NITROSTAT) 0.4 MG SL tablet Place 1 tablet (0.4 mg total) under the tongue every 5 (five) minutes x 3 doses as needed for chest pain. 30 tablet 0   No current facility-administered medications on file prior to visit.    Allergies  Allergen Reactions  . Lisinopril Hives and Swelling    Family History  Problem Relation Age of Onset  . Diabetes Mother   . Hypertension Mother   . Cancer Father 78    oral  . Diabetes Sister   . Hypertension Sister   . Diabetes Brother   . Hypertension Brother   . Sudden Cardiac Death Neg Hx   . Heart attack Neg Hx     Social History   Social History  . Marital Status: Married    Spouse  Name: N/A  . Number of Children: N/A  . 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 Smoking status: Current Every Day Smoker -- 1.00 packs/day for 31 years    Types: Cigarettes  . Smokeless tobacco: Never Used  . Alcohol Use: No  . Drug Use: No  . Sexual Activity:    Partners: Male   Other Topics Concern  . None   Social History Narrative   Lives in [place] with family.    Review of Systems - See HPI.  All other ROS are negative.  BP 112/90 mmHg  Pulse 89  Temp(Src) 98.9 F (37.2 C) (Oral)  Resp 16  Ht 5' 1"  (1.549 m)  Wt 173 lb 4 oz (78.586 kg)  BMI 32.75 kg/m2  SpO2 99%  LMP 11/21/2014  Physical Exam  Constitutional: She is oriented to person, place, and time and well-developed, well-nourished, and in no distress.  Cardiovascular: Normal rate, regular rhythm, normal heart sounds and intact distal pulses.   Pulmonary/Chest: Effort normal and breath sounds normal. No respiratory distress. She has no wheezes. She has no rales. She exhibits no tenderness.  Neurological: She is alert and oriented to person, place, and time.  Skin: Skin is warm and dry. No rash noted.  Psychiatric: Affect normal.  Vitals reviewed.   Recent Results (from the past 2160 hour(s))  Protime-INR     Status: None   Collection Time: 10/22/14  8:41 AM  Result Value Ref Range   Prothrombin Time 13.7 11.6 - 15.2 seconds   INR 1.03 0.00 - 1.49  CBC with Differential/Platelet     Status: None   Collection Time: 10/22/14  8:41 AM  Result Value Ref Range   WBC 6.8 4.0 - 10.5 K/uL   RBC 4.45 3.87 - 5.11 MIL/uL   Hemoglobin 12.4 12.0 - 15.0 g/dL   HCT 37.0 36.0 - 46.0 %   MCV 83.1 78.0 - 100.0 fL   MCH 27.9 26.0 - 34.0 pg   MCHC 33.5 30.0 - 36.0 g/dL   RDW 13.9 11.5 - 15.5 %   Platelets 386 150 - 400 K/uL   Neutrophils Relative % 46 43 - 77 %   Neutro Abs 3.2 1.7 - 7.7 K/uL   Lymphocytes Relative 46 12 - 46 %   Lymphs Abs 3.1 0.7 -  4.0 K/uL   Monocytes Relative 6 3 - 12 %   Monocytes Absolute 0.4 0.1 - 1.0 K/uL   Eosinophils Relative 2 0 - 5 %   Eosinophils Absolute 0.2 0.0 - 0.7 K/uL   Basophils Relative 0 0 - 1 %   Basophils Absolute 0.0 0.0 - 0.1 K/uL  Magnesium     Status: None   Collection Time: 10/22/14  8:41 AM  Result Value Ref Range   Magnesium 1.8 1.7 - 2.4 mg/dL  Comprehensive metabolic panel     Status: Abnormal   Collection Time: 10/22/14  8:41 AM  Result Value Ref Range   Sodium 137 135 - 145 mmol/L   Potassium 3.6 3.5 - 5.1 mmol/L   Chloride 105 101 - 111 mmol/L   CO2 23 22 - 32 mmol/L   Glucose, Bld 96 65 - 99 mg/dL   BUN 10 6 - 20 mg/dL   Creatinine, Ser 0.54 0.44 - 1.00 mg/dL   Calcium 9.3 8.9 - 10.3 mg/dL   Total Protein 7.6 6.5 - 8.1 g/dL   Albumin 4.2 3.5 - 5.0 g/dL   AST 19 15 - 41 U/L   ALT 13 (L) 14 - 54 U/L   Alkaline Phosphatase 75 38 - 126 U/L   Total Bilirubin 0.4 0.3 - 1.2 mg/dL   GFR calc non Af Amer >60 >60 mL/min   GFR calc Af Amer >60 >60 mL/min    Comment: (NOTE) The eGFR has been calculated using the CKD EPI equation. This calculation has not been validated in all clinical situations. eGFR's persistently <60 mL/min signify possible Chronic Kidney Disease.    Anion gap 9 5 - 15  TSH     Status: None   Collection Time: 10/22/14  8:47 AM  Result Value Ref Range   TSH 0.659 0.350 - 4.500 uIU/mL  I-stat troponin, ED (0, 3, 6)  not at [facility]     Status: None   Collection Time: 10/22/14  8:59 AM  Result Value Ref Range   Troponin i, poc 0.00 0.00 - 0.08 ng/mL   Comment 3            Comment: Due to the release kinetics of cTnI, a negative result within the first hours of the onset of symptoms does not rule out myocardial infarction with certainty. If myocardial infarction is still suspected, repeat the test at appropriate intervals.   Urinalysis, Routine w reflex microscopic (not  at [facility])     Status: Abnormal   Collection Time: 10/22/14  9:22 AM  Result Value  Ref Range   Color, Urine YELLOW YELLOW   APPearance CLOUDY (A) CLEAR   Specific Gravity, Urine 1.014 1.005 - 1.030   pH 7.0 5.0 - 8.0   Glucose, UA NEGATIVE NEGATIVE mg/dL   Hgb urine dipstick MODERATE (A) NEGATIVE   Bilirubin Urine NEGATIVE NEGATIVE   Ketones, ur NEGATIVE NEGATIVE mg/dL   Protein, ur NEGATIVE NEGATIVE mg/dL   Urobilinogen, UA 0.2 0.0 - 1.0 mg/dL   Nitrite NEGATIVE NEGATIVE   Leukocytes, UA NEGATIVE NEGATIVE  Urine rapid drug screen (hosp performed)     Status: Abnormal   Collection Time: 10/22/14  9:22 AM  Result Value Ref Range   Opiates POSITIVE (A) NONE DETECTED   Cocaine NONE DETECTED NONE DETECTED   Benzodiazepines NONE DETECTED NONE DETECTED   Amphetamines NONE DETECTED NONE DETECTED   Tetrahydrocannabinol NONE DETECTED NONE DETECTED   Barbiturates NONE DETECTED NONE DETECTED    Comment:        DRUG SCREEN FOR MEDICAL PURPOSES ONLY.  IF CONFIRMATION IS NEEDED FOR ANY PURPOSE, NOTIFY LAB WITHIN 5 DAYS.        LOWEST DETECTABLE LIMITS FOR URINE DRUG SCREEN Drug Class       Cutoff (ng/mL) Amphetamine      1000 Barbiturate      200 Benzodiazepine   388 Tricyclics       875 Opiates          300 Cocaine          300 THC              50   Urine microscopic-add on     Status: None   Collection Time: 10/22/14  9:22 AM  Result Value Ref Range   Squamous Epithelial / LPF RARE RARE   WBC, UA 0-2 <3 WBC/hpf   RBC / HPF 11-20 <3 RBC/hpf   Bacteria, UA RARE RARE   Urine-Other MUCOUS PRESENT   POC urine preg, ED (not at [facility])     Status: None   Collection Time: 10/22/14  9:38 AM  Result Value Ref Range   Preg Test, Ur NEGATIVE NEGATIVE    Comment:        THE SENSITIVITY OF THIS METHODOLOGY IS >24 mIU/mL   I-stat troponin, ED (0, 3, 6)  not at [facility]     Status: None   Collection Time: 10/22/14 11:40 AM  Result Value Ref Range   Troponin i, poc 0.00 0.00 - 0.08 ng/mL   Comment 3            Comment: Due to the release kinetics of cTnI, a negative  result within the first hours of the onset of symptoms does not rule out myocardial infarction with certainty. If myocardial infarction is still suspected, repeat the test at appropriate intervals.   Troponin I     Status: None   Collection Time: 10/22/14  2:13 PM  Result Value Ref Range   Troponin I <0.03 <0.031 ng/mL    Comment:        NO INDICATION OF MYOCARDIAL INJURY.   D-dimer, quantitative (not at [facility])     Status: None   Collection Time: 10/22/14  2:13 PM  Result Value Ref Range   D-Dimer, Quant <0.27 0.00 - 0.48 ug/mL-FEU    Comment:        AT THE INHOUSE ESTABLISHED CUTOFF VALUE OF 0.48 ug/mL  FEU, THIS ASSAY HAS BEEN DOCUMENTED IN THE LITERATURE TO HAVE A SENSITIVITY AND NEGATIVE PREDICTIVE VALUE OF AT LEAST 98 TO 99%.  THE TEST RESULT SHOULD BE CORRELATED WITH AN ASSESSMENT OF THE CLINICAL PROBABILITY OF DVT / VTE.   MRSA PCR Screening     Status: None   Collection Time: 10/22/14  4:09 PM  Result Value Ref Range   MRSA by PCR NEGATIVE NEGATIVE    Comment:        The GeneXpert MRSA Assay (FDA approved for NASAL specimens only), is one component of a comprehensive MRSA colonization surveillance program. It is not intended to diagnose MRSA infection nor to guide or monitor treatment for MRSA infections. Performed at [place] metabolic panel     Status: Abnormal   Collection Time: 10/23/14  3:33 AM  Result Value Ref Range   Sodium 138 135 - 145 mmol/L   Potassium 4.0 3.5 - 5.1 mmol/L   Chloride 107 101 - 111 mmol/L   CO2 24 22 - 32 mmol/L   Glucose, Bld 106 (H) 65 - 99 mg/dL   BUN 10 6 - 20 mg/dL   Creatinine, Ser 0.54 0.44 - 1.00 mg/dL   Calcium 9.1 8.9 - 10.3 mg/dL   GFR calc non Af Amer >60 >60 mL/min   GFR calc Af Amer >60 >60 mL/min    Comment: (NOTE) The eGFR has been calculated using the CKD EPI equation. This calculation has not been validated in all clinical situations. eGFR's persistently <60 mL/min signify possible  Chronic Kidney Disease.    Anion gap 7 5 - 15  CBC     Status: Abnormal   Collection Time: 10/23/14  3:33 AM  Result Value Ref Range   WBC 8.5 4.0 - 10.5 K/uL    Comment: WHITE COUNT CONFIRMED ON SMEAR   RBC 4.47 3.87 - 5.11 MIL/uL   Hemoglobin 12.5 12.0 - 15.0 g/dL   HCT 38.0 36.0 - 46.0 %   MCV 85.0 78.0 - 100.0 fL   MCH 28.0 26.0 - 34.0 pg   MCHC 32.9 30.0 - 36.0 g/dL   RDW 14.3 11.5 - 15.5 %   Platelets 411 (H) 150 - 400 K/uL  Myocardial Perfusion Imaging     Status: None   Collection Time: 10/28/14 12:11 PM  Result Value Ref Range   Rest HR 73 bpm   Rest BP 143/100 mmHg   Exercise duration (min) 9 min   Exercise duration (sec)  sec   Estimated workload 10.1 METS   Peak HR 155 bpm   Peak BP 168/97 mmHg   MPHR 175 bpm   Percent HR 88 %   RPE 16    LV Systolic Volume 36 mL   TID 9.01    LV Diastolic Volume 90 mL   LHR 0.34    SSS 1    SRS 0    SDS 1     Assessment/Plan: Cigarette nicotine dependence Will begin Wellbutrin SR 150 mg BID to help with mood and smoking cessation.  Symptomatic PVCs Continue Metoprolol as directed by Cardiology. Continue Xanax for acute anxiety associated with palpitations.  Anxiety state Continue xanax as directed. Will add Wellbutrin SR BID. Follow-up 6 weeks.

## 2014-12-11 NOTE — Progress Notes (Signed)
Pre visit review using our clinic review tool, if applicable. No additional management support is needed unless otherwise documented below in the visit note/SLS  

## 2014-12-11 NOTE — Assessment & Plan Note (Signed)
Continue Metoprolol as directed by Cardiology. Continue Xanax for acute anxiety associated with palpitations.

## 2014-12-11 NOTE — Patient Instructions (Signed)
Please continue your Metoprolol as directed by Cardiology. Continue Xanax as directed when needed for acute anxiety. Begin the Wellbutrin as directed for mood and to help with smoking cessation. Follow-up in 6 weeks.

## 2014-12-11 NOTE — Assessment & Plan Note (Signed)
Continue xanax as directed. Will add Wellbutrin SR BID. Follow-up 6 weeks.

## 2014-12-11 NOTE — Telephone Encounter (Signed)
Emma Stephens, is there any reason why she was kept out or work or should not be working?

## 2014-12-11 NOTE — Assessment & Plan Note (Signed)
Will begin Wellbutrin SR 150 mg BID to help with mood and smoking cessation.

## 2014-12-11 NOTE — Telephone Encounter (Signed)
Note placed at the front desk

## 2014-12-11 NOTE — Telephone Encounter (Signed)
Reviewed with Amber- there should be no reason the patient could not work- ok to return to work. I advised the patient of this. She needs a note to state this. I advised her I will have Amber sign her a note. She is agreeable and will pick this up in the office tomorrow.

## 2014-12-11 NOTE — Telephone Encounter (Signed)
New message      Calling to see if pt can return to work.  If yes, when?  She will need a note to return to work.  Her PCP told her it was the decision of the cardiologist.  Please call

## 2014-12-20 ENCOUNTER — Other Ambulatory Visit (INDEPENDENT_AMBULATORY_CARE_PROVIDER_SITE_OTHER): Payer: BLUE CROSS/BLUE SHIELD | Admitting: *Deleted

## 2014-12-20 DIAGNOSIS — I10 Essential (primary) hypertension: Secondary | ICD-10-CM | POA: Diagnosis not present

## 2014-12-20 LAB — CBC WITH DIFFERENTIAL/PLATELET
BASOS ABS: 0 10*3/uL (ref 0.0–0.1)
Basophils Relative: 0.6 % (ref 0.0–3.0)
Eosinophils Absolute: 0.2 10*3/uL (ref 0.0–0.7)
Eosinophils Relative: 3.6 % (ref 0.0–5.0)
HCT: 38 % (ref 36.0–46.0)
Hemoglobin: 12.4 g/dL (ref 12.0–15.0)
LYMPHS PCT: 49.1 % — AB (ref 12.0–46.0)
Lymphs Abs: 3.2 10*3/uL (ref 0.7–4.0)
MCHC: 32.8 g/dL (ref 30.0–36.0)
MCV: 85.6 fl (ref 78.0–100.0)
MONOS PCT: 6.3 % (ref 3.0–12.0)
Monocytes Absolute: 0.4 10*3/uL (ref 0.1–1.0)
Neutro Abs: 2.7 10*3/uL (ref 1.4–7.7)
Neutrophils Relative %: 40.4 % — ABNORMAL LOW (ref 43.0–77.0)
Platelets: 356 10*3/uL (ref 150.0–400.0)
RBC: 4.44 Mil/uL (ref 3.87–5.11)
RDW: 14.7 % (ref 11.5–15.5)
WBC: 6.6 10*3/uL (ref 4.0–10.5)

## 2014-12-20 LAB — BASIC METABOLIC PANEL
BUN: 9 mg/dL (ref 6–23)
CALCIUM: 9.2 mg/dL (ref 8.4–10.5)
CO2: 27 meq/L (ref 19–32)
Chloride: 105 mEq/L (ref 96–112)
Creatinine, Ser: 0.72 mg/dL (ref 0.40–1.20)
GFR: 112.29 mL/min (ref >60.00)
Glucose, Bld: 82 mg/dL (ref 70–99)
POTASSIUM: 3.7 meq/L (ref 3.5–5.1)
SODIUM: 139 meq/L (ref 135–145)

## 2014-12-20 LAB — PROTIME-INR
INR: 1.1 ratio — ABNORMAL HIGH (ref 0.8–1.0)
Prothrombin Time: 12.7 s (ref 9.6–13.1)

## 2015-01-16 ENCOUNTER — Telehealth: Payer: Self-pay | Admitting: Physician Assistant

## 2015-01-16 DIAGNOSIS — F411 Generalized anxiety disorder: Secondary | ICD-10-CM

## 2015-01-16 DIAGNOSIS — F430 Acute stress reaction: Principal | ICD-10-CM

## 2015-01-16 NOTE — Telephone Encounter (Signed)
Pt was last seen 12/11/14.  Pt has an appointment on 01/22/15. Please advise on refill.

## 2015-01-16 NOTE — Telephone Encounter (Signed)
Pharmacy: [facility] on [address]  Reason for call: Pt needing refill on ALPRAZolam (XANAX) 0.25 MG tablet. Has 5 left. Ok to send in 01/17/15. Takes 2-3/day.

## 2015-01-17 MED ORDER — ALPRAZOLAM 0.25 MG PO TABS: 0.2500 mg | ORAL_TABLET | Freq: Three times a day (TID) | ORAL | Status: DC

## 2015-01-17 NOTE — Telephone Encounter (Signed)
Refill granted. Rx printed, signed and faxed to [facility].

## 2015-01-22 ENCOUNTER — Ambulatory Visit: Payer: BLUE CROSS/BLUE SHIELD | Admitting: Physician Assistant

## 2015-04-08 ENCOUNTER — Ambulatory Visit: Payer: BLUE CROSS/BLUE SHIELD | Admitting: Physician Assistant

## 2015-04-08 ENCOUNTER — Telehealth: Payer: Self-pay | Admitting: Physician Assistant

## 2015-04-11 ENCOUNTER — Encounter: Payer: Self-pay | Admitting: Physician Assistant

## 2015-04-11 NOTE — Telephone Encounter (Signed)
Pt was no show 04/08/15 4:15pm for acute appt, pt did not reschedule, charge or no charge?

## 2015-04-11 NOTE — Telephone Encounter (Signed)
Marked to charge, mailing letter °

## 2015-04-11 NOTE — Telephone Encounter (Signed)
charge 

## 2015-04-18 ENCOUNTER — Encounter: Payer: Self-pay | Admitting: Physician Assistant

## 2015-04-18 ENCOUNTER — Ambulatory Visit (INDEPENDENT_AMBULATORY_CARE_PROVIDER_SITE_OTHER): Payer: BLUE CROSS/BLUE SHIELD | Admitting: Physician Assistant

## 2015-04-18 VITALS — BP 162/80 | HR 91 | Temp 98.0°F | Ht 61.0 in | Wt 173.5 lb

## 2015-04-18 DIAGNOSIS — L299 Pruritus, unspecified: Secondary | ICD-10-CM

## 2015-04-18 MED ORDER — METHYLPREDNISOLONE 4 MG PO TBPK: ORAL_TABLET | ORAL | Status: DC

## 2015-04-18 NOTE — Progress Notes (Signed)
Patient presents to clinic today c/o significant pruritus after starting her Wellbutrin that has consistently worsened. Denies rash. Patient denies change to soaps, lotions or detergents. Denies pet in the home. Denies contact with similar symptoms. Patient states she stopped all of her medications and has restarted them one-by-one to find the culprit. Endorses itching before when she was on Wellbutrin at a younger age. States she forgot to mention this when medication was chosen.  Past Medical History  Diagnosis Date  . Hypertension   . Gout 2011  . Cigarette nicotine dependence     Current Outpatient Prescriptions on File Prior to Visit  Medication Sig Dispense Refill  . ALPRAZolam (XANAX) 0.25 MG tablet Take 1 tablet (0.25 mg total) by mouth 3 (three) times daily. (Patient not taking: Reported on 04/18/2015) 90 tablet 2  . aspirin EC 81 MG EC tablet Take 1 tablet (81 mg total) by mouth daily. (Patient not taking: Reported on 04/18/2015) 30 tablet 0  . Cyanocobalamin (VITAMIN B-12) 2500 MCG SUBL Place 5,000 mcg under the tongue daily. Reported on 04/18/2015    . metoprolol (LOPRESSOR) 50 MG tablet Take 1 tablet (50 mg total) by mouth 2 (two) times daily. (Patient not taking: Reported on 04/18/2015) 180 tablet 1  . nitroGLYCERIN (NITROSTAT) 0.4 MG SL tablet Place 1 tablet (0.4 mg total) under the tongue every 5 (five) minutes x 3 doses as needed for chest pain. (Patient not taking: Reported on 04/18/2015) 30 tablet 0   No current facility-administered medications on file prior to visit.    Allergies  Allergen Reactions  . Lisinopril Hives and Swelling    Family History  Problem Relation Age of Onset  . Diabetes Mother   . Hypertension Mother   . Cancer Father 14    oral  . Diabetes Sister   . Hypertension Sister   . Diabetes Brother   . Hypertension Brother   . Sudden Cardiac Death Neg Hx   . Heart attack Neg Hx     Social History   Social History  . Marital Status:  Married    Spouse Name: N/A  . Number of Children: N/A  . 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 Smoking status: Current Every Day Smoker -- 1.00 packs/day for 31 years    Types: Cigarettes  . Smokeless tobacco: Never Used  . Alcohol Use: No  . Drug Use: No  . Sexual Activity:    Partners: Male   Other Topics Concern  . None   Social History Narrative   Lives in [place] with family.   Review of Systems - See HPI.  All other ROS are negative.  BP 162/80 mmHg  Pulse 91  Temp(Src) 98 F (36.7 C) (Oral)  Ht 5\' 1"  (1.549 m)  Wt 173 lb 8 oz (78.699 kg)  BMI 32.80 kg/m2  SpO2 98%  Physical Exam  Constitutional: She is well-developed, well-nourished, and in no distress.  HENT:  Head: Normocephalic and atraumatic.  Eyes: Conjunctivae are normal.  Cardiovascular: Normal rate, regular rhythm, normal heart sounds and intact distal pulses.   Pulmonary/Chest: Effort normal and breath sounds normal. No respiratory distress. She has no wheezes. She has no rales. She exhibits no tenderness.  Skin: Skin is warm and dry. No rash noted.  Psychiatric: Affect normal.  Vitals reviewed.   No results found for this or any previous visit (from the past 2160 hour(s)).  Assessment/Plan:  Pruritus Wellbutrin added to allergy list. Stay off of the medication. Will give medrol dose pack. Benadryl at bedtime. Cool compresses and Sarna lotion. Follow-up next week once resolved to discuss replacement for the Wellbutrin. Patient to restart her BP medication and ASA immediately as BP is elevated.

## 2015-04-18 NOTE — Patient Instructions (Signed)
Please take the medrol dose pack as directed. Continue Benadryl at night. Claritin in the morning. Over the counter Sarna lotion may be beneficial. Cool compresses will also help.  We will stop the Wellbutrin completely.  Restart your aspirin and metoprolol.  Follow-up with me in 1 week.

## 2015-04-20 DIAGNOSIS — L299 Pruritus, unspecified: Secondary | ICD-10-CM | POA: Insufficient documentation

## 2015-04-20 NOTE — Assessment & Plan Note (Signed)
Wellbutrin added to allergy list. Stay off of the medication. Will give medrol dose pack. Benadryl at bedtime. Cool compresses and Sarna lotion. Follow-up next week once resolved to discuss replacement for the Wellbutrin. Patient to restart her BP medication and ASA immediately as BP is elevated.

## 2015-04-23 ENCOUNTER — Ambulatory Visit (INDEPENDENT_AMBULATORY_CARE_PROVIDER_SITE_OTHER): Payer: BLUE CROSS/BLUE SHIELD | Admitting: Physician Assistant

## 2015-04-23 ENCOUNTER — Encounter: Payer: Self-pay | Admitting: Physician Assistant

## 2015-04-23 VITALS — BP 158/80 | HR 71 | Temp 98.0°F | Ht 61.0 in | Wt 174.8 lb

## 2015-04-23 DIAGNOSIS — I10 Essential (primary) hypertension: Secondary | ICD-10-CM

## 2015-04-23 DIAGNOSIS — F17213 Nicotine dependence, cigarettes, with withdrawal: Secondary | ICD-10-CM | POA: Diagnosis not present

## 2015-04-23 DIAGNOSIS — L299 Pruritus, unspecified: Secondary | ICD-10-CM | POA: Diagnosis not present

## 2015-04-23 MED ORDER — VARENICLINE TARTRATE 0.5 MG X 11 & 1 MG X 42 PO MISC: ORAL | Status: DC

## 2015-04-23 NOTE — Progress Notes (Signed)
Pre visit review using our clinic review tool, if applicable. No additional management support is needed unless otherwise documented below in the visit note. 

## 2015-04-23 NOTE — Assessment & Plan Note (Signed)
Significant pruritus with Wellbutrin which has been discontinued. Will start Chantix as directed. Follow-up in 1 month.

## 2015-04-23 NOTE — Assessment & Plan Note (Signed)
Improved on recheck but still elevated. Asymptomatic. Suspect timing of medication dose this morning and steroid use is contributing. Continue meds as directed. DASH diet stressed. Follow-up with nurse in 1-2 weeks for BP recheck.

## 2015-04-23 NOTE — Patient Instructions (Signed)
Please continue BP medication as directed. Limit salt intake.  Return to clinic in 2 weeks for a BP check with the nurse.  Finish the entire course of steroid. Start the Chantix as directed. Follow-up with me in 1 month. If you notice any worsening mood, please stop medication immediately and come see me.  Varenicline oral tablets What is this medicine? VARENICLINE (var EN i kleen) is used to help people quit smoking. It can reduce the symptoms caused by stopping smoking. It is used with a patient support program recommended by your physician. This medicine may be used for other purposes; ask your health care provider or pharmacist if you have questions. What should I tell my health care provider before I take this medicine? They need to know if you have any of these conditions: -bipolar disorder, depression, schizophrenia or other mental illness -heart disease -if you often drink alcohol -kidney disease -peripheral vascular disease -seizures -stroke -suicidal thoughts, plans, or attempt; a previous suicide attempt by you or a family member -an unusual or allergic reaction to varenicline, other medicines, foods, dyes, or preservatives -pregnant or trying to get pregnant -breast-feeding How should I use this medicine? Take this medicine by mouth after eating. Take with a full glass of water. Follow the directions on the prescription label. Take your doses at regular intervals. Do not take your medicine more often than directed. There are 3 ways you can use this medicine to help you quit smoking; talk to your health care professional to decide which plan is right for you: 1) you can choose a quit date and start this medicine 1 week before the quit date, or, 2) you can start taking this medicine before you choose a quit date, and then pick a quit date between day 8 and 35 days of treatment, or, 3) if you are not sure that you are able or willing to quit smoking right away, start taking this  medicine and slowly decrease the amount you smoke as directed by your health care professional with the goal of being cigarette-free by week 12 of treatment. Stick to your plan; ask about support groups or other ways to help you remain cigarette-free. If you are motivated to quit smoking and did not succeed during a previous attempt with this medicine for reasons other than side effects, or if you returned to smoking after this treatment, speak with your health care professional about whether another course of this medicine may be right for you. A special MedGuide will be given to you by the pharmacist with each prescription and refill. Be sure to read this information carefully each time. Talk to your pediatrician regarding the use of this medicine in children. This medicine is not approved for use in children. Overdosage: If you think you have taken too much of this medicine contact a poison control center or emergency room at once. NOTE: This medicine is only for you. Do not share this medicine with others. What if I miss a dose? If you miss a dose, take it as soon as you can. If it is almost time for your next dose, take only that dose. Do not take double or extra doses. What may interact with this medicine? -alcohol or any product that contains alcohol -insulin -other stop smoking aids -theophylline -warfarin This list may not describe all possible interactions. Give your health care provider a list of all the medicines, herbs, non-prescription drugs, or dietary supplements you use. Also tell them if you smoke, drink  alcohol, or use illegal drugs. Some items may interact with your medicine. What should I watch for while using this medicine? Visit your doctor or health care professional for regular check ups. Ask for ongoing advice and encouragement from your doctor or healthcare professional, friends, and family to help you quit. If you smoke while on this medication, quit again Your mouth may  get dry. Chewing sugarless gum or sucking hard candy, and drinking plenty of water may help. Contact your doctor if the problem does not go away or is severe. You may get drowsy or dizzy. Do not drive, use machinery, or do anything that needs mental alertness until you know how this medicine affects you. Do not stand or sit up quickly, especially if you are an older patient. This reduces the risk of dizzy or fainting spells. Sleepwalking can happen during treatment with this medicine, and can sometimes lead to behavior that is harmful to you, other people, or property. Stop taking this medicine and tell your doctor if you start sleepwalking or have other unusual sleep-related activity. Decrease the amount of alcoholic beverages that you drink during treatment with this medicine until you know if this medicine affects your ability to tolerate alcohol. Some people have experienced increased drunkenness (intoxication), unusual or sometimes aggressive behavior, or no memory of things that have happened (amnesia) during treatment with this medicine. The use of this medicine may increase the chance of suicidal thoughts or actions. Pay special attention to how you are responding while on this medicine. Any worsening of mood, or thoughts of suicide or dying should be reported to your health care professional right away. What side effects may I notice from receiving this medicine? Side effects that you should report to your doctor or health care professional as soon as possible: -allergic reactions like skin rash, itching or hives, swelling of the face, lips, tongue, or throat -acting aggressive, being angry or violent, or acting on dangerous impulses -breathing problems -changes in vision -chest pain or chest tightness -confusion, trouble speaking or understanding -new or worsening depression, anxiety, or panic attacks -extreme increase in activity and talking (mania) -fast, irregular heartbeat -feeling  faint or lightheaded, falls -fever -pain in legs when walking -problems with balance, talking, walking -redness, blistering, peeling or loosening of the skin, including inside the mouth -ringing in ears -seeing or hearing things that aren't there (hallucinations) -seizures -sleepwalking -sudden numbness or weakness of the face, arm or leg -thoughts about suicide or dying, or attempts to commit suicide -trouble passing urine or change in the amount of urine -unusual bleeding or bruising -unusually weak or tired Side effects that usually do not require medical attention (report to your doctor or health care professional if they continue or are bothersome): -constipation -headache -nausea, vomiting -strange dreams -stomach gas -trouble sleeping This list may not describe all possible side effects. Call your doctor for medical advice about side effects. You may report side effects to FDA at 1-800-FDA-1088. Where should I keep my medicine? Keep out of the reach of children. Store at room temperature between 15 and 30 degrees C (59 and 86 degrees F). Throw away any unused medicine after the expiration date. NOTE: This sheet is a summary. It may not cover all possible information. If you have questions about this medicine, talk to your doctor, pharmacist, or health care provider.    2016, Elsevier/Gold Standard. (2014-11-21 16:14:23)

## 2015-04-23 NOTE — Progress Notes (Signed)
Patient presents to clinic today for follow-up of pruritus as side effect of Wellbutrin. Is taking medrol pack as directed. Has stopped wellbutrin. Symptoms have completely resolved. Patient has restarted her Metoprolol taking twice daily. Patient denies chest pain, palpitations, lightheadedness, dizziness, vision changes or frequent headaches.  BP Readings from Last 3 Encounters:  04/23/15 158/80  04/18/15 162/80  12/11/14 112/90   Patient needs other option for smoking cessation as she cannot tolerate Wellbutrin. Anxiety is under good control per patient and she would like to attempt trial of Chantix.  Past Medical History  Diagnosis Date  . Hypertension   . Gout 2011  . Cigarette nicotine dependence     Current Outpatient Prescriptions on File Prior to Visit  Medication Sig Dispense Refill  . ALPRAZolam (XANAX) 0.25 MG tablet Take 1 tablet (0.25 mg total) by mouth 3 (three) times daily. 90 tablet 2  . aspirin EC 81 MG EC tablet Take 1 tablet (81 mg total) by mouth daily. 30 tablet 0  . Cyanocobalamin (VITAMIN B-12) 2500 MCG SUBL Place 5,000 mcg under the tongue daily. Reported on 04/18/2015    . methylPREDNISolone (MEDROL DOSEPAK) 4 MG TBPK tablet Take following package directions 21 tablet 0  . metoprolol (LOPRESSOR) 50 MG tablet Take 1 tablet (50 mg total) by mouth 2 (two) times daily. 180 tablet 1  . nitroGLYCERIN (NITROSTAT) 0.4 MG SL tablet Place 1 tablet (0.4 mg total) under the tongue every 5 (five) minutes x 3 doses as needed for chest pain. 30 tablet 0  . diphenhydrAMINE (BENADRYL) 25 MG tablet Take 25 mg by mouth at bedtime as needed for itching. Reported on 04/23/2015     No current facility-administered medications on file prior to visit.    Allergies  Allergen Reactions  . Lisinopril Hives and Swelling  . Wellbutrin [Bupropion] Itching    Family History  Problem Relation Age of Onset  . Diabetes Mother   . Hypertension Mother   . Cancer Father 2    oral  .  Diabetes Sister   . Hypertension Sister   . Diabetes Brother   . Hypertension Brother   . Sudden Cardiac Death Neg Hx   . Heart attack Neg Hx     Social History   Social History  . Marital Status: Married    Spouse Name: N/A  . Number of Children: N/A  . 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 Smoking status: Current Every Day Smoker -- 1.00 packs/day for 31 years    Types: Cigarettes  . Smokeless tobacco: Never Used  . Alcohol Use: No  . Drug Use: No  . Sexual Activity:    Partners: Male   Other Topics Concern  . None   Social History Narrative   Lives in [place] with family.   Review of Systems - See HPI.  All other ROS are negative.  BP 158/80 mmHg  Pulse 71  Temp(Src) 98 F (36.7 C) (Oral)  Ht 5\' 1"  (1.549 m)  Wt 174 lb 12.8 oz (79.289 kg)  BMI 33.05 kg/m2  SpO2 100%  LMP 04/08/2015  Physical Exam  Constitutional: She is oriented to person, place, and time and well-developed, well-nourished, and in no distress.  HENT:  Head: Normocephalic and atraumatic.  Eyes: Conjunctivae are normal.  Neck: Neck supple.  Cardiovascular: Normal rate, regular rhythm, normal heart sounds and intact distal pulses.   Pulmonary/Chest: Effort normal and breath sounds  normal. No respiratory distress. She has no wheezes. She has no rales. She exhibits no tenderness.  Neurological: She is alert and oriented to person, place, and time.  Skin: Skin is warm and dry. No rash noted.  Psychiatric: Affect normal.  Vitals reviewed.  No results found for this or any previous visit (from the past 2160 hour(s)).  Assessment/Plan: Pruritus Resolved. Patient to finish the last day of her Medrol pack. Supportive measures reviewed.   Cigarette nicotine dependence Significant pruritus with Wellbutrin which has been discontinued. Will start Chantix as directed. Follow-up in 1 month.  Essential hypertension, benign Improved on  recheck but still elevated. Asymptomatic. Suspect timing of medication dose this morning and steroid use is contributing. Continue meds as directed. DASH diet stressed. Follow-up with nurse in 1-2 weeks for BP recheck.

## 2015-04-23 NOTE — Assessment & Plan Note (Signed)
Resolved. Patient to finish the last day of her Medrol pack. Supportive measures reviewed.

## 2015-05-07 ENCOUNTER — Ambulatory Visit (INDEPENDENT_AMBULATORY_CARE_PROVIDER_SITE_OTHER): Payer: BLUE CROSS/BLUE SHIELD | Admitting: Family

## 2015-05-07 ENCOUNTER — Ambulatory Visit: Payer: BLUE CROSS/BLUE SHIELD | Admitting: Physician Assistant

## 2015-05-07 ENCOUNTER — Encounter: Payer: Self-pay | Admitting: Family

## 2015-05-07 VITALS — BP 141/80 | HR 70

## 2015-05-07 VITALS — BP 141/80 | HR 70 | Temp 98.3°F | Resp 16 | Ht 61.75 in | Wt 173.2 lb

## 2015-05-07 DIAGNOSIS — M542 Cervicalgia: Secondary | ICD-10-CM | POA: Diagnosis not present

## 2015-05-07 DIAGNOSIS — I10 Essential (primary) hypertension: Secondary | ICD-10-CM

## 2015-05-07 DIAGNOSIS — H6123 Impacted cerumen, bilateral: Secondary | ICD-10-CM

## 2015-05-07 NOTE — Progress Notes (Signed)
Subjective:    Patient ID: Emma Stephens, female    DOB: Aug 15, 1970, 45 y.o.   MRN: [id number]  HPI  Pt presents today following nurse visit with complaint of left ear pain/fullness.  Has been present since 05/02/15.  Pain is described as sharp and radiates into the neck.  She is using Hyland's drops without improvement in pain.  Denies URI sxs.   Review of Systems See HPI  Past Medical History  Diagnosis Date  . Hypertension   . Gout 2011  . Cigarette nicotine dependence     Social History   Social History  . Marital Status: Married    Spouse Name: N/A  . Number of Children: N/A  . 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 Smoking status: Current Every Day Smoker -- 1.00 packs/day for 31 years    Types: Cigarettes  . Smokeless tobacco: Never Used  . Alcohol Use: No  . Drug Use: No  . Sexual Activity:    Partners: Male   Other Topics Concern  . Not on file   Social History Narrative   Lives in [place] with family.    Past Surgical History  Procedure Laterality Date  . Neck surgery    . Tubal ligation      Family History  Problem Relation Age of Onset  . Diabetes Mother   . Hypertension Mother   . Cancer Father 15    oral  . Diabetes Sister   . Hypertension Sister   . Diabetes Brother   . Hypertension Brother   . Sudden Cardiac Death Neg Hx   . Heart attack Neg Hx     Allergies  Allergen Reactions  . Lisinopril Hives and Swelling  . Wellbutrin [Bupropion] Itching    Current Outpatient Prescriptions on File Prior to Visit  Medication Sig Dispense Refill  . ALPRAZolam (XANAX) 0.25 MG tablet Take 1 tablet (0.25 mg total) by mouth 3 (three) times daily. 90 tablet 2  . aspirin EC 81 MG EC tablet Take 1 tablet (81 mg total) by mouth daily. 30 tablet 0  . Cyanocobalamin (VITAMIN B-12) 2500 MCG SUBL Place 5,000 mcg under the tongue daily. Reported on 04/18/2015    . metoprolol (LOPRESSOR)  50 MG tablet Take 1 tablet (50 mg total) by mouth 2 (two) times daily. 180 tablet 1  . nitroGLYCERIN (NITROSTAT) 0.4 MG SL tablet Place 1 tablet (0.4 mg total) under the tongue every 5 (five) minutes x 3 doses as needed for chest pain. 30 tablet 0  . varenicline (CHANTIX PAK) 0.5 MG X 11 & 1 MG X 42 tablet Take one 0.5 mg tablet by mouth once daily for 3 days, then increase to one 0.5 mg tablet twice daily for 4 days, then increase to one 1 mg tablet twice daily. 53 tablet 0   No current facility-administered medications on file prior to visit.    BP 141/80 mmHg  Pulse 70  Temp(Src) 98.3 F (36.8 C) (Oral)  Resp 16  Ht 5' 1.75" (1.568 m)  Wt 173 lb 3.2 oz (78.563 kg)  BMI 31.95 kg/m2  SpO2 98%  LMP 04/08/2015       Objective:   Physical Exam  Constitutional: She appears well-developed and well-nourished.  HENT:  Head: Normocephalic and atraumatic.  Mouth/Throat: No posterior oropharyngeal edema or posterior oropharyngeal erythema.  Bilateral cerumen impaction noted. After removal, normal TM's are revealed bilaterally  Poor dentition  Neck:  Mild left cervical LAD- + tenderness to palpation at top of left anterior cervical chain.          Assessment & Plan:  Ceruminosis-  Ceruminosis is noted.  Wax is removed by syringing and manual debridement with curette. Instructions for home care to prevent wax buildup are given.  Tenderness of neck- ? Dental etiology, ? Viral cause for mild cervical LAD. No obvious OM on exam.  Advised short course of NSAIDS, follow up with dental and let us know if symptoms worsen or do not improve. Pt verbalizes understanding.

## 2015-05-07 NOTE — Progress Notes (Signed)
Pre visit review using our clinic review tool, if applicable. No additional management support is needed unless otherwise documented below in the visit note. 

## 2015-05-07 NOTE — Patient Instructions (Signed)
You may use ibuprofen as needed for neck pain. Please contact your dentist to arrange evaluation. Call if symptoms worsen, if you develop fever, or if symptoms are not improved in 1 week.

## 2015-05-07 NOTE — Progress Notes (Signed)
Per 04/23/15 OV note: Return to clinic in 2 weeks for a BP check with the nurse.  Pt has taken already taken metoprolol this morning. Her SO states that she has had increased fatigue since starting metoprolol.  Per Einar Pheasant: Continue current medication regimen. F/u w/ cardiology regarding fatigue r/t metoprolol as they are prescribing.  Pt aware of instructions and verbalized understanding.  Pt also c/o ear pain/fullness. Appt scheduled w/ Melissa today.   Dorrene German, RN

## 2015-05-07 NOTE — Progress Notes (Signed)
Reviewed. Will see patient at follow-up.

## 2015-05-19 ENCOUNTER — Other Ambulatory Visit: Payer: Self-pay | Admitting: Physician Assistant

## 2015-06-02 ENCOUNTER — Telehealth: Payer: Self-pay | Admitting: Physician Assistant

## 2015-06-02 MED ORDER — VARENICLINE TARTRATE 1 MG PO TABS: 1.0000 mg | ORAL_TABLET | Freq: Two times a day (BID) | ORAL | Status: DC

## 2015-06-02 NOTE — Telephone Encounter (Signed)
Caller name:Self  Can be reached: [phone]  Pharmacy:  [id number] - [place], [place]. [phone] (Phone) [phone] (Fax)       Reason for call: Refill varenicline (CHANTIX PAK) 0.5 MG X 11 & 1 MG X 42 tablet [id number]

## 2015-06-02 NOTE — Telephone Encounter (Signed)
RX sent to the pharmacy by e-script.//AB/CMA 

## 2015-06-02 NOTE — Telephone Encounter (Signed)
Please advise.//AB/CMA 

## 2015-06-02 NOTE — Telephone Encounter (Signed)
Ok to send in Rx but is should be for Chantix continuing pack. (the refill she requested is for a starter pack which she does not need again).

## 2015-06-04 ENCOUNTER — Telehealth: Payer: Self-pay | Admitting: Physician Assistant

## 2015-06-04 IMAGING — CR DG HAND COMPLETE 3+V*L*
3 series · 3 of 3 positions shown · non-contrast
Comparison: None.

CLINICAL DATA: Fall.  Pain in hand.

EXAM:
LEFT HAND - COMPLETE 3+ VIEW

[PA]
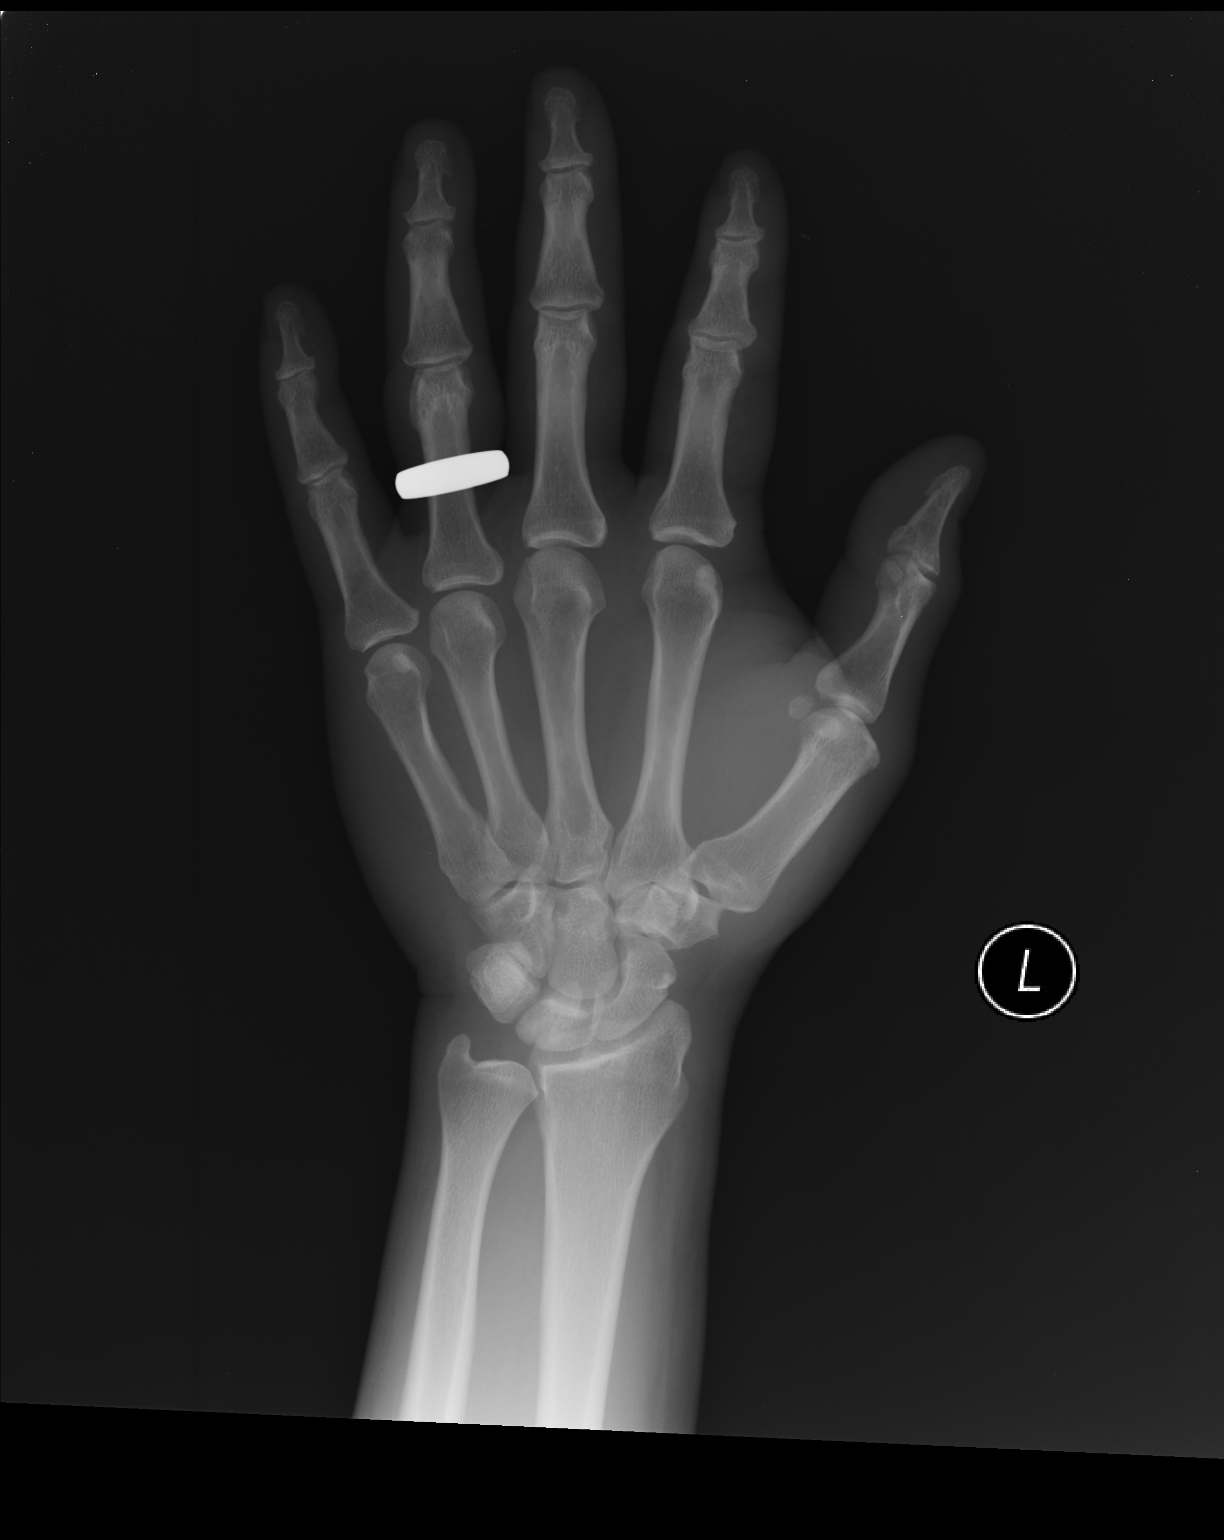

[pa obl]
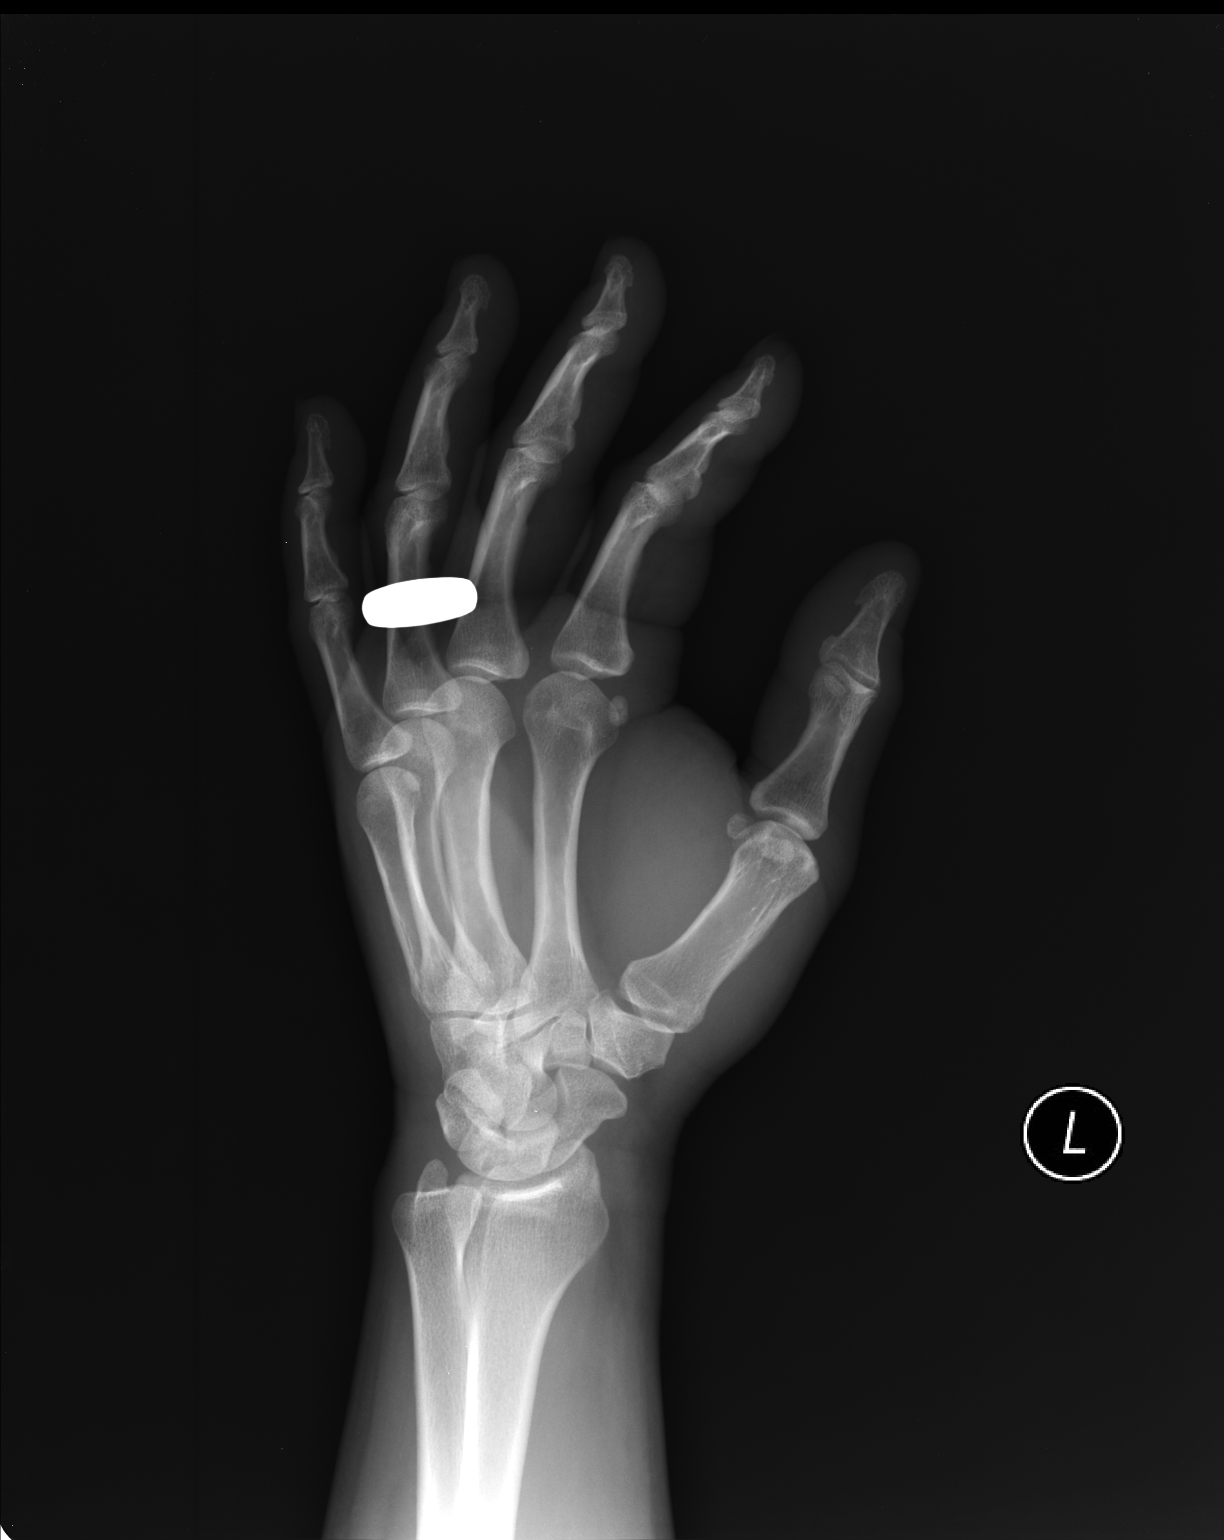

[lateral]
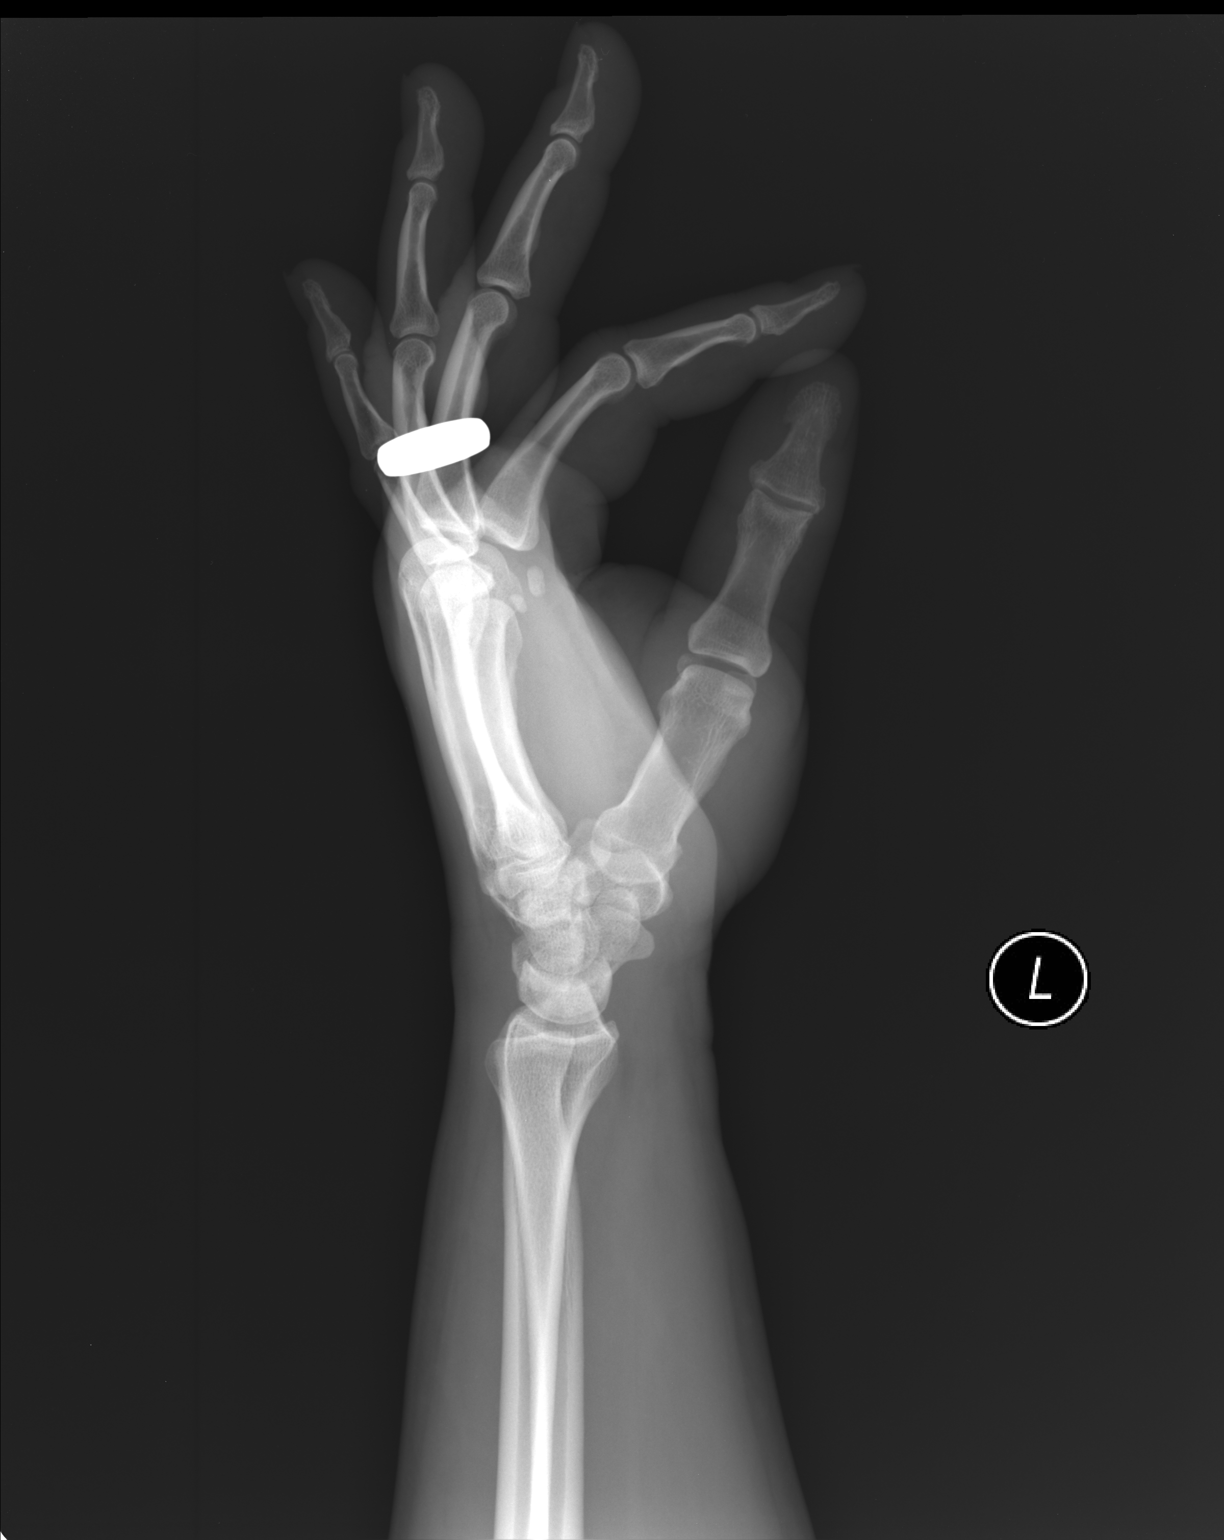

[3 of 3 positions shown; findings below may reference images not displayed]

FINDINGS: Proximal lucency in the lunate is indistinct and technically
nonspecific although probably from a geode or degenerative
subcortical cyst based on the oblique projection appearance.

No discrete fracture involving the hand is identified.
IMPRESSION: 1. Lucency in the proximal lunate, probably a geode or degenerative
subcortical cystic lesion.

## 2015-06-04 IMAGING — CR DG WRIST COMPLETE 3+V*L*
2 series · 2 of 2 positions shown · non-contrast
Comparison: Left hand of today's date

CLINICAL DATA: Wrist pain status post fall

EXAM:
LEFT WRIST - COMPLETE 3+ VIEW

[PA]
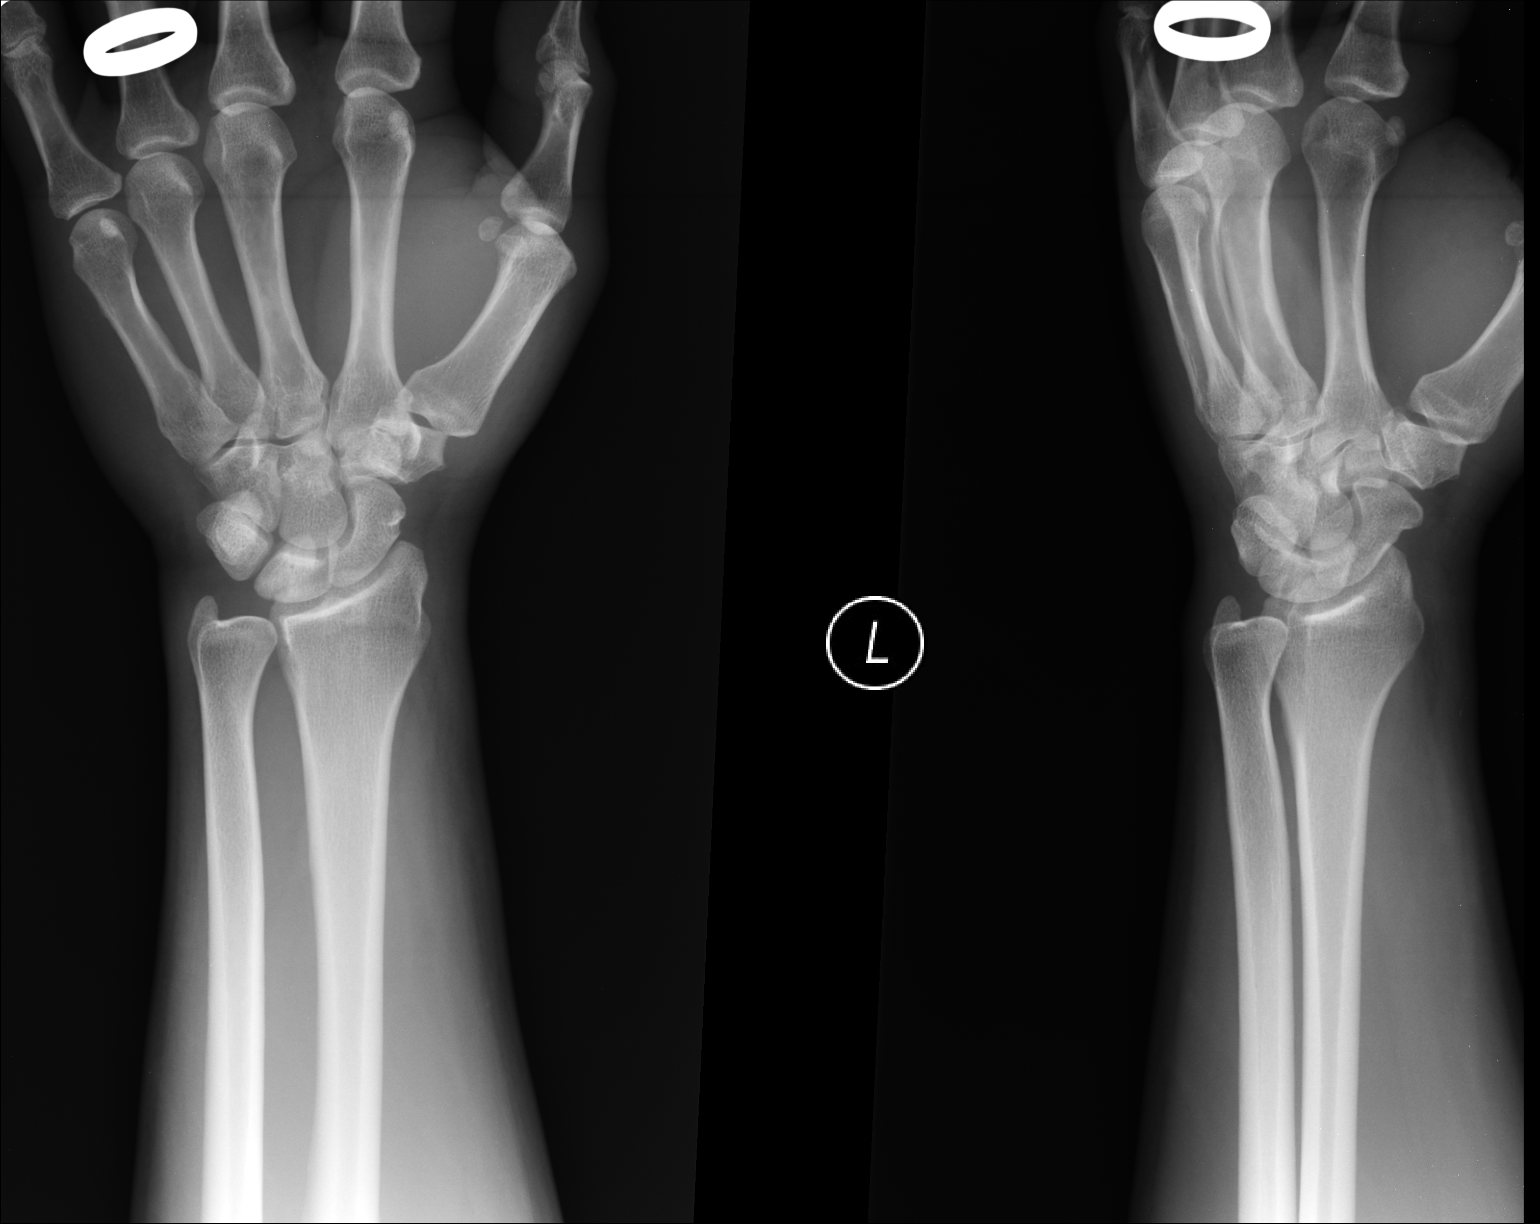

[lateral]
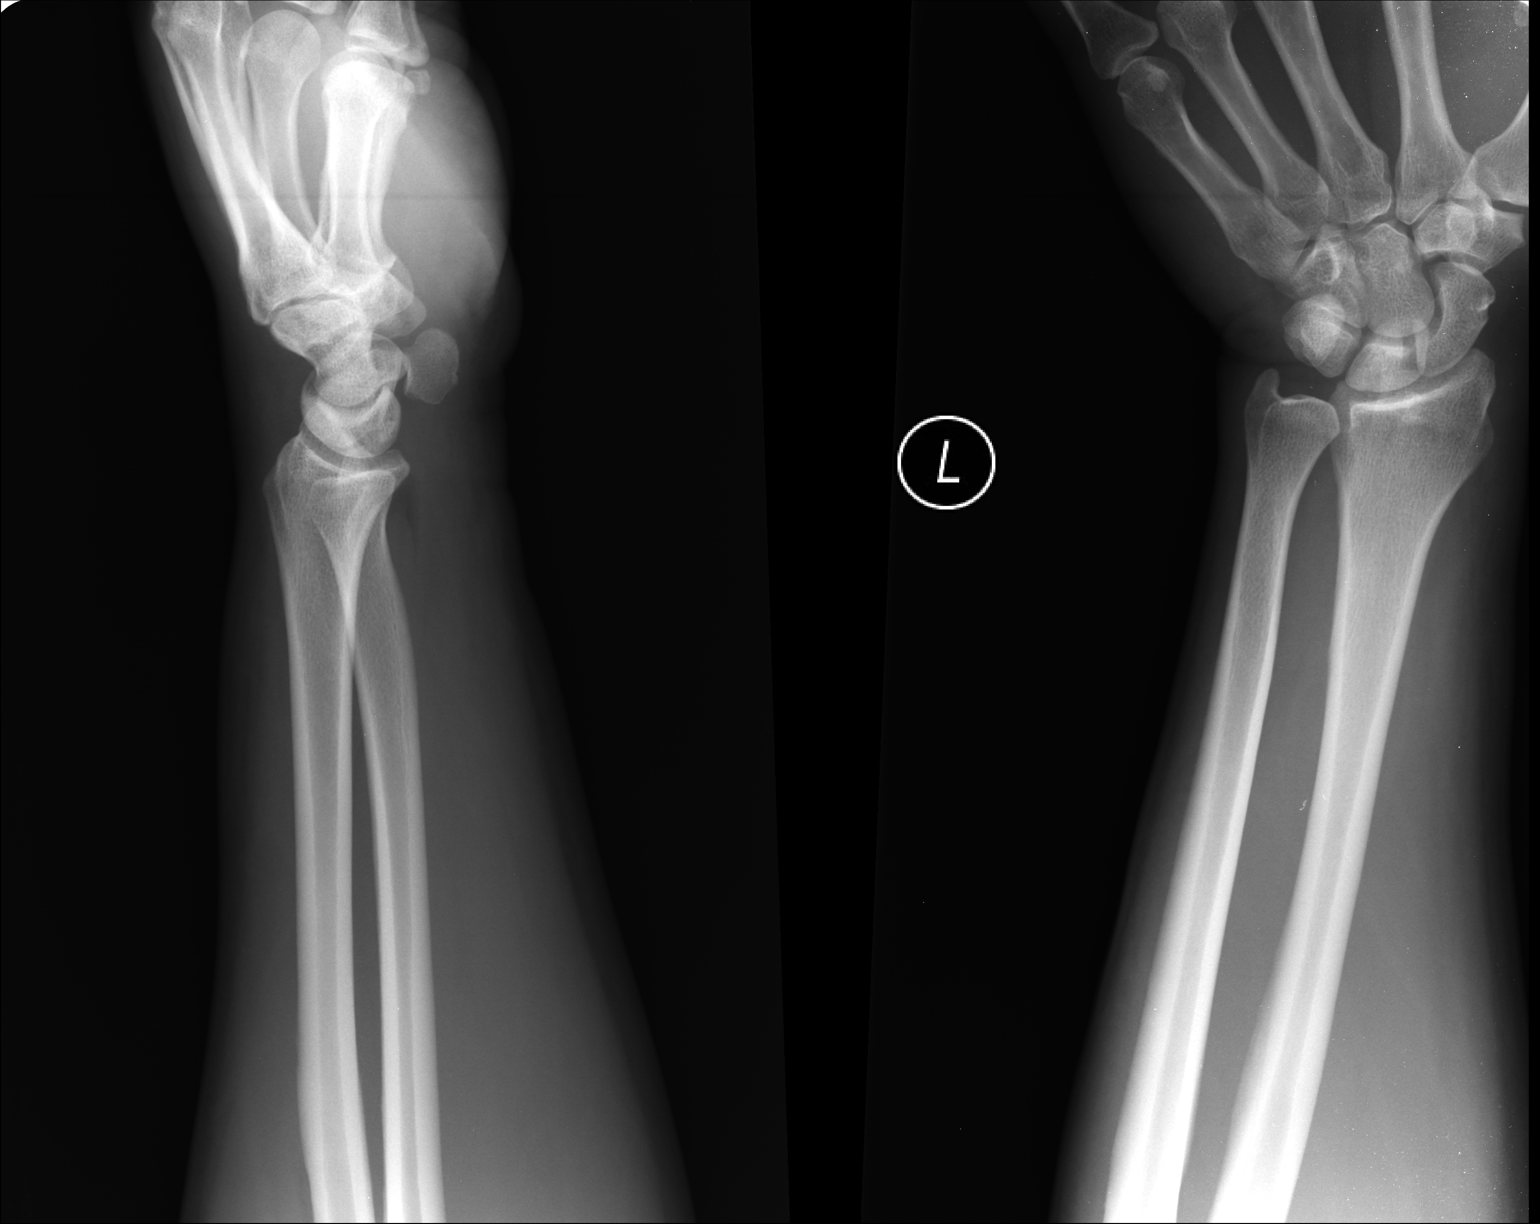

[2 of 2 positions shown; findings below may reference images not displayed]

FINDINGS: The bones of the wrist are adequately mineralized. There is no acute
fracture nor dislocation. The radiocarpal, ulnocarpal, and
intercarpal joints are normal. The carpometacarpal joints are
unremarkable as well. Specific attention to the scaphoid reveals no
acute abnormality.
IMPRESSION: There is no acute bony abnormality of the left wrist.

## 2015-06-04 NOTE — Telephone Encounter (Signed)
Pt declined the flu vac

## 2015-06-04 NOTE — Telephone Encounter (Signed)
Flu shot postponed.//AB/CMA

## 2015-06-12 ENCOUNTER — Other Ambulatory Visit: Payer: Self-pay | Admitting: Internal Medicine

## 2015-06-12 NOTE — Telephone Encounter (Signed)
REFILL 

## 2015-06-30 ENCOUNTER — Other Ambulatory Visit: Payer: Self-pay | Admitting: Physician Assistant

## 2015-08-17 ENCOUNTER — Other Ambulatory Visit: Payer: Self-pay | Admitting: Physician Assistant

## 2015-08-19 NOTE — Telephone Encounter (Signed)
Rx request faxed to pharmacy/[facility]  

## 2015-08-19 NOTE — Telephone Encounter (Signed)
eScribe request from [facility] for refill on Alprazolam 0.25mg  TID Last filled - 01/17/15; #90x2 Last AEX - 05/07/15 Next AEX - 2-Wks for BP; pt has not had F/U Please Advise on refills/SLS

## 2015-10-28 ENCOUNTER — Emergency Department (HOSPITAL_COMMUNITY)
Admission: EM | Admit: 2015-10-28 | Discharge: 2015-10-28 | Disposition: A | Discharge status: Home / Self Care | Payer: Self-pay | Attending: Emergency Medicine | Admitting: Emergency Medicine

## 2015-10-28 ENCOUNTER — Emergency Department (HOSPITAL_COMMUNITY): Payer: Self-pay

## 2015-10-28 ENCOUNTER — Encounter (HOSPITAL_COMMUNITY): Payer: Self-pay | Admitting: Emergency Medicine

## 2015-10-28 DIAGNOSIS — I10 Essential (primary) hypertension: Secondary | ICD-10-CM | POA: Insufficient documentation

## 2015-10-28 DIAGNOSIS — F17210 Nicotine dependence, cigarettes, uncomplicated: Secondary | ICD-10-CM | POA: Insufficient documentation

## 2015-10-28 DIAGNOSIS — M545 Low back pain, unspecified: Secondary | ICD-10-CM

## 2015-10-28 DIAGNOSIS — Z79899 Other long term (current) drug therapy: Secondary | ICD-10-CM | POA: Insufficient documentation

## 2015-10-28 DIAGNOSIS — M62830 Muscle spasm of back: Secondary | ICD-10-CM

## 2015-10-28 DIAGNOSIS — Z7982 Long term (current) use of aspirin: Secondary | ICD-10-CM | POA: Insufficient documentation

## 2015-10-28 DIAGNOSIS — M62838 Other muscle spasm: Secondary | ICD-10-CM | POA: Insufficient documentation

## 2015-10-28 HISTORY — DX: Ventricular premature depolarization: I49.3

## 2015-10-28 LAB — URINALYSIS, ROUTINE W REFLEX MICROSCOPIC
BILIRUBIN URINE: NEGATIVE
Glucose, UA: NEGATIVE mg/dL
Hgb urine dipstick: NEGATIVE
Ketones, ur: NEGATIVE mg/dL
LEUKOCYTES UA: NEGATIVE
Nitrite: NEGATIVE
PH: 5.5 (ref 5.0–8.0)
Protein, ur: NEGATIVE mg/dL
Specific Gravity, Urine: 1.026 (ref 1.005–1.030)

## 2015-10-28 LAB — I-STAT CHEM 8, ED
BUN: 6 mg/dL (ref 6–20)
Calcium, Ion: 1.07 mmol/L — ABNORMAL LOW (ref 1.13–1.30)
Chloride: 100 mmol/L — ABNORMAL LOW (ref 101–111)
Creatinine, Ser: 0.6 mg/dL (ref 0.44–1.00)
GLUCOSE: 90 mg/dL (ref 65–99)
HEMATOCRIT: 39 % (ref 36.0–46.0)
HEMOGLOBIN: 13.3 g/dL (ref 12.0–15.0)
POTASSIUM: 3.6 mmol/L (ref 3.5–5.1)
Sodium: 138 mmol/L (ref 135–145)
TCO2: 23 mmol/L (ref 0–100)

## 2015-10-28 LAB — POC URINE PREG, ED: Preg Test, Ur: NEGATIVE

## 2015-10-28 MED ORDER — NAPROXEN 375 MG PO TABS: 375.0000 mg | ORAL_TABLET | Freq: Two times a day (BID) | ORAL | 0 refills | Status: AC

## 2015-10-28 MED ORDER — HYDROCODONE-ACETAMINOPHEN 5-325 MG PO TABS
2.0000 | ORAL_TABLET | Freq: Once | ORAL | Status: AC
  Administered 2015-10-28: 2 via ORAL
  Filled 2015-10-28: qty 2

## 2015-10-28 MED ORDER — HYDROCODONE-ACETAMINOPHEN 5-325 MG PO TABS: 1.0000 | ORAL_TABLET | Freq: Four times a day (QID) | ORAL | 0 refills | Status: DC | PRN

## 2015-10-28 MED ORDER — CYCLOBENZAPRINE HCL 10 MG PO TABS: 10.0000 mg | ORAL_TABLET | Freq: Three times a day (TID) | ORAL | 0 refills | Status: DC | PRN

## 2015-10-28 MED ORDER — DIAZEPAM 5 MG/ML IJ SOLN
5.0000 mg | Freq: Once | INTRAMUSCULAR | Status: AC
  Administered 2015-10-28: 5 mg via INTRAMUSCULAR
  Filled 2015-10-28: qty 2

## 2015-10-28 MED ORDER — METHOCARBAMOL 1000 MG/10ML IJ SOLN
1000.0000 mg | Freq: Once | INTRAMUSCULAR | Status: DC
  Filled 2015-10-28: qty 10

## 2015-10-28 MED ORDER — KETOROLAC TROMETHAMINE 60 MG/2ML IM SOLN
60.0000 mg | Freq: Once | INTRAMUSCULAR | Status: AC
  Administered 2015-10-28: 60 mg via INTRAMUSCULAR
  Filled 2015-10-28: qty 2

## 2015-10-28 MED ORDER — METHOCARBAMOL 500 MG PO TABS
1000.0000 mg | ORAL_TABLET | Freq: Once | ORAL | Status: AC
  Administered 2015-10-28: 1000 mg via ORAL
  Filled 2015-10-28: qty 2

## 2015-10-28 NOTE — ED Triage Notes (Signed)
Pt c/o bilat low back pain radiating around groin, describes as spasm with occasional weakness in R leg x 2 days. Pt does not radiate down legs. Denies injury

## 2015-10-28 NOTE — ED Provider Notes (Signed)
[facility] DEPT Provider Note   CSN: [id number] Arrival date & time: 10/28/15  1844  First Provider Contact:  First MD Initiated Contact with Patient 10/28/15 1900     INITIAL DOCUMENTATION DELAYED DUE TO EPIC DOWNTIME   History   Chief Complaint Chief Complaint  Patient presents with  . Back Pain    HPI Emma Stephens is a 45 y.o. female.  69 yo AAF with PMHx of poorly-controlled HTN who presents with lower back pain. Pt states that her sx started 2 days ago. Pt was sitting in an old office chair that "was really stiff" watching movies 2 days ago. When she stood up, she felt acute onset of bilateral paraspinal "spasms." Since then, she has had persistent lower back stiffness that is made worse with any movement or palpation. The pain occasionally radiates toward her groin but not down her legs, with no numbness or weakness. No loss of bowel or bladder function. No falls. No abdominal pain.   The history is provided by the spouse, medical records and the patient.    Past Medical History:  Diagnosis Date  . Cigarette nicotine dependence   . Gout 2011  . Hypertension   . PVC (premature ventricular contraction)     Patient Active Problem List   Diagnosis Date Noted  . Pruritus 04/20/2015  . Anxiety state 12/11/2014  . Symptomatic PVCs 10/22/2014  . Cigarette nicotine dependence 10/22/2014  . Acute gout 07/30/2014  . Visit for preventive health examination 07/30/2014  . Essential hypertension, benign 05/27/2014  . Recurrent knee pain 05/27/2014    Past Surgical History:  Procedure Laterality Date  . NECK SURGERY    . TUBAL LIGATION      OB History    No data available       Home Medications    Prior to Admission medications   Medication Sig Start Date End Date Taking? Authorizing Provider  ALPRAZolam (XANAX) 0.25 MG tablet TAKE 1 TABLET BY MOUTH 3 TIMES A DAY Patient taking differently: TAKE 1 TABLET BY MOUTH 2 TIMES A DAY 08/19/15  Yes Brunetta Jeans, PA-C  aspirin EC 81 MG EC tablet Take 1 tablet (81 mg total) by mouth daily. 10/23/14  Yes Janece Canterbury, MD  cholecalciferol (VITAMIN D) 1000 units tablet Take 1,000 Units by mouth daily.   Yes Historical Provider, MD  Cyanocobalamin (VITAMIN B-12) 2500 MCG SUBL Place 5,000 mcg under the tongue daily. Reported on 04/18/2015   Yes Historical Provider, MD  metoprolol (LOPRESSOR) 50 MG tablet Take 1 tablet (50 mg total) by mouth 2 (two) times daily. NEED OV. 06/12/15  Yes [name], MD  CHANTIX 1 MG tablet TAKE 1 TABLET BY MOUTH 2 TIMES DAILY Patient not taking: Reported on 10/28/2015 06/30/15   Brunetta Jeans, PA-C  cyclobenzaprine (FLEXERIL) 10 MG tablet Take 1 tablet (10 mg total) by mouth 3 (three) times daily as needed for muscle spasms. 10/28/15   Duffy Bruce, MD  HYDROcodone-acetaminophen (NORCO/VICODIN) 5-325 MG tablet Take 1 tablet by mouth every 6 (six) hours as needed for severe pain. 10/28/15   Duffy Bruce, MD  naproxen (NAPROSYN) 375 MG tablet Take 1 tablet (375 mg total) by mouth 2 (two) times daily. 10/28/15 11/04/15  Duffy Bruce, MD  nitroGLYCERIN (NITROSTAT) 0.4 MG SL tablet Place 1 tablet (0.4 mg total) under the tongue every 5 (five) minutes x 3 doses as needed for chest pain. 10/23/14   Janece Canterbury, MD    Family History Family History  Problem Relation Age of Onset  . Diabetes Mother   . Hypertension Mother   . Cancer Father 62    oral  . Diabetes Sister   . Hypertension Sister   . Diabetes Brother   . Hypertension Brother   . Sudden Cardiac Death Neg Hx   . Heart attack Neg Hx     Social History Social History  Substance Use Topics  . Smoking status: Current Every Day Smoker    Packs/day: 1.00    Years: 31.00    Types: Cigarettes  . Smokeless tobacco: Never Used  . Alcohol use No     Allergies   Lisinopril and Wellbutrin [bupropion]   Review of Systems Review of Systems  Constitutional: Negative for chills, fatigue and fever.  HENT:  Negative for congestion and rhinorrhea.   Eyes: Negative for visual disturbance.  Respiratory: Negative for cough, shortness of breath and wheezing.   Cardiovascular: Negative for chest pain and leg swelling.  Gastrointestinal: Negative for abdominal pain, diarrhea, nausea and vomiting.  Genitourinary: Negative for dysuria, flank pain, vaginal bleeding and vaginal discharge.  Musculoskeletal: Positive for back pain and gait problem (due to pain). Negative for neck pain and neck stiffness.  Skin: Negative for rash and wound.  Allergic/Immunologic: Negative for immunocompromised state.  Neurological: Negative for syncope, weakness and headaches.     Physical Exam Updated Vital Signs BP (!) 184/101 (BP Location: Right Arm)   Pulse 72   Temp 98.1 F (36.7 C) (Oral)   Resp 20   Ht 5\' 1"  (1.549 m)   Wt 168 lb (76.2 kg)   LMP 09/26/2015 Comment: tubal ligation   SpO2 94%   BMI 31.74 kg/m   Physical Exam  Constitutional: She is oriented to person, place, and time. She appears well-developed and well-nourished. No distress.  HENT:  Head: Normocephalic and atraumatic.  Mouth/Throat: Oropharynx is clear and moist.  Eyes: Conjunctivae are normal. Pupils are equal, round, and reactive to light.  Neck: Neck supple.  Cardiovascular: Normal rate, regular rhythm and normal heart sounds.  Exam reveals no friction rub.   No murmur heard. Pulses:      Dorsalis pedis pulses are 2+ on the right side, and 2+ on the left side.       Posterior tibial pulses are 2+ on the right side, and 2+ on the left side.  Pulmonary/Chest: Effort normal and breath sounds normal. No respiratory distress. She has no wheezes. She has no rales.  Abdominal: She exhibits no distension. There is no tenderness.  Musculoskeletal: She exhibits no edema.  Neurological: She is alert and oriented to person, place, and time. She exhibits normal muscle tone.  Skin: Skin is warm. Capillary refill takes less than 2 seconds.    Nursing note and vitals reviewed.   Spine Exam: Inspection/Palpation: Marked paraspinal TTP with increased tone throughout lumbar paraspinal muscles. Minimal midline TTP. No erythema or deformity. Strength: 5/5 throughout LE bilaterally (hip flexion/extension, adduction/abduction; knee flexion/extension; foot dorsiflexion/plantarflexion, inversion/eversion; great toe inversion) Sensation: Intact to light touch in proximal and distal LE bilaterally Reflexes: 2+ quadriceps and achilles reflexes  ED Treatments / Results  Labs (all labs ordered are listed, but only abnormal results are displayed) Labs Reviewed  URINALYSIS, ROUTINE W REFLEX MICROSCOPIC (NOT AT [facility]) - Abnormal; Notable for the following:       Result Value   APPearance CLOUDY (*)    All other components within normal limits  I-STAT CHEM 8, ED - Abnormal; Notable for the following:  Chloride 100 (*)    Calcium, Ion 1.07 (*)    All other components within normal limits  POC URINE PREG, ED    EKG  EKG Interpretation None       Radiology Dg Lumbar Spine Complete  Result Date: 10/28/2015 CLINICAL DATA:  Intense low back pain without injury for 2 days. EXAM: LUMBAR SPINE - COMPLETE 4+ VIEW COMPARISON:  08/22/2013 FINDINGS: No fracture. No subluxation. Intervertebral disc spaces are preserved. The facets are well aligned bilaterally. Limbus deformity identified at the T12 level. The SI joints are normal in appearance. IMPRESSION: Stable.  No acute findings. Electronically Signed   By: Misty Stanley M.D.   On: 10/28/2015 21:21    Procedures Procedures (including critical care time)  Medications Ordered in ED Medications  diazepam (VALIUM) injection 5 mg (5 mg Intramuscular Given 10/28/15 1958)  ketorolac (TORADOL) injection 60 mg (60 mg Intramuscular Given 10/28/15 1958)  methocarbamol (ROBAXIN) tablet 1,000 mg (1,000 mg Oral Given 10/28/15 1958)  HYDROcodone-acetaminophen (NORCO/VICODIN) 5-325 MG per tablet 2 tablet (2  tablets Oral Given 10/28/15 2033)     Initial Impression / Assessment and Plan / ED Course  I have reviewed the triage vital signs and the nursing notes.  Pertinent labs & imaging results that were available during my care of the patient were reviewed by me and considered in my medical decision making (see chart for details).  Clinical Course   45 yo F with PMHx of poorly-controlled HTN who p/w lower back pain x 2 days. Exam as above, consistent with severe paraspinal spasm likely 2/2 sitting in chair 2 days ago. No red flag symptoms. No fever, chills, IVDU, recent instrumentation, or risk factors for osteo or epidural abscess. Plain films show no fx or bony lesions. No loss of bowel or bladder function, LE weakness or numbness, or signs of cauda equina. There was no trauma. Pt has no abdominal pain, pulses are symmetric, and do not suspect AAA or dissection. UA shows no hematuria or abnormality. Sx improved with symptomatic control in ED and pt ambulatory without difficulty. Will d/c with pain control, muscle relaxants, and PCP f/u. Encouraged regualr movement and stretching.  Final Clinical Impressions(s) / ED Diagnoses   Final diagnoses:  Paraspinal muscle spasm  Bilateral low back pain without sciatica    New Prescriptions Discharge Medication List as of 10/28/2015  9:55 PM    START taking these medications   Details  cyclobenzaprine (FLEXERIL) 10 MG tablet Take 1 tablet (10 mg total) by mouth 3 (three) times daily as needed for muscle spasms., Starting Tue 10/28/2015, Print    HYDROcodone-acetaminophen (NORCO/VICODIN) 5-325 MG tablet Take 1 tablet by mouth every 6 (six) hours as needed for severe pain., Starting Tue 10/28/2015, Print    naproxen (NAPROSYN) 375 MG tablet Take 1 tablet (375 mg total) by mouth 2 (two) times daily., Starting Tue 10/28/2015, Until Tue 11/04/2015, Print         Duffy Bruce, MD 10/29/15 1220

## 2015-10-28 NOTE — ED Notes (Signed)
Bed: WA01 Expected date:  Expected time:  Means of arrival:  Comments: triage 

## 2015-10-29 NOTE — ED Notes (Signed)
PT ASSISTED TO THE BATHROOM, STEADY SLOW GAIT. PT TOLERATED WELL. URINE SPECIMEN COLLECTED.

## 2015-12-15 ENCOUNTER — Telehealth: Payer: Self-pay | Admitting: Physician Assistant

## 2015-12-15 MED ORDER — ALPRAZOLAM 0.25 MG PO TABS: 0.2500 mg | ORAL_TABLET | Freq: Two times a day (BID) | ORAL | 0 refills | Status: DC

## 2015-12-15 NOTE — Telephone Encounter (Signed)
Relation to [id number] Call back [place]: [facility] #[id number] - [place], [place].  Reason for call:  Patient requesting a refill ALPRAZolam (XANAX) 0.25 MG tablet

## 2015-12-15 NOTE — Telephone Encounter (Signed)
Ok to print Rx for 30 tablets. She needs UDS and to update CSC. She will have to pick up.

## 2015-12-15 NOTE — Telephone Encounter (Signed)
Rx printed for provider signature upon returning to the office tomorrow; [facility] printed and Rx marked for UDS, will call patient and informed must p/u this month when ready/SLS 09/25

## 2015-12-16 ENCOUNTER — Encounter: Payer: Self-pay | Admitting: Physician Assistant

## 2015-12-16 NOTE — Telephone Encounter (Signed)
Patient informed Rx ready for p/u during regular business hours, understood & agreed/SLS 09/26

## 2016-01-13 ENCOUNTER — Telehealth: Payer: Self-pay | Admitting: Physician Assistant

## 2016-01-13 NOTE — Telephone Encounter (Signed)
Caller name: Relationship to patient: Self Can be reached: [phone]  Pharmacy:  Reason for call: Refill ALPRAZolam (XANAX) 0.25 MG tablet [id number]

## 2016-01-14 MED ORDER — ALPRAZOLAM 0.25 MG PO TABS: 0.2500 mg | ORAL_TABLET | Freq: Two times a day (BID) | ORAL | 1 refills | Status: DC

## 2016-01-14 NOTE — Telephone Encounter (Signed)
Refill has been faxed to pharmacy.

## 2017-05-20 ENCOUNTER — Ambulatory Visit: Payer: Self-pay

## 2017-05-20 NOTE — Telephone Encounter (Signed)
Patient called in with c/o "nipple discharge." She says "I went in the shower this morning and for some reason I squeezed my right breast and some milky discharge came out my nipple, like when you first have a baby and start breastfeeding, that's what it looked like." I asked is her breast painful, she said "no." I asked was there bloody drainage, she said "no, just white milky. This has never happened before and my youngest is 7." Denies redness, nipple changes, breast changes, fever. According to protocol see PCP within 2 weeks, appointment made for Monday, 05/30/17 at 1030, care advice given, patient verbalized understanding.   Reason for Disposition . [1] Nipple discharge AND [2] not bloody (e.g., clear, white, yellow, brown, green)  Answer Assessment - Initial Assessment Questions 1. SYMPTOM: "What's the main symptom you're concerned about?"  (e.g., lump, pain, rash, nipple discharge)     Nipple discharge-milky 2. LOCATION: "Where is the _______ located?"     Right breast 3. ONSET: "When did ________  start?"     Today after I squeezed my breast 4. PRIOR HISTORY: "Do you have any history of prior problems with your breasts?" (e.g., lumps, cancer, fibrocystic breast disease)     No 5. CAUSE: "What do you think is causing this symptom?"     I don't know 6. OTHER SYMPTOMS: "Do you have any other symptoms?" (e.g., fever, breast pain, redness or rash, nipple discharge)     No-2 weeks prior to cycle, breasts get tender and gorged 7. PREGNANCY-BREASTFEEDING: "Is there any chance you are pregnant?" "When was your last menstrual period?" "Are you breastfeeding?"     No-tubal ligation; LMP 05/19/17; Not breastfeeding-youngest 48 years old  Protocols used: BREAST [facility]

## 2017-05-30 ENCOUNTER — Ambulatory Visit: Payer: Self-pay | Admitting: Physician Assistant

## 2018-06-24 ENCOUNTER — Other Ambulatory Visit: Payer: Self-pay

## 2018-06-24 ENCOUNTER — Inpatient Hospital Stay: Admit: 2018-06-24 | Payer: Self-pay | Admitting: Internal Medicine

## 2018-06-24 ENCOUNTER — Observation Stay (HOSPITAL_BASED_OUTPATIENT_CLINIC_OR_DEPARTMENT_OTHER)
Admission: EM | Admit: 2018-06-24 | Discharge: 2018-06-25 | Disposition: A | Discharge status: Home / Self Care | Payer: Self-pay | Attending: Cardiovascular Disease | Admitting: Cardiovascular Disease

## 2018-06-24 ENCOUNTER — Encounter (HOSPITAL_BASED_OUTPATIENT_CLINIC_OR_DEPARTMENT_OTHER): Payer: Self-pay | Admitting: Emergency Medicine

## 2018-06-24 DIAGNOSIS — I10 Essential (primary) hypertension: Secondary | ICD-10-CM | POA: Insufficient documentation

## 2018-06-24 DIAGNOSIS — Z7982 Long term (current) use of aspirin: Secondary | ICD-10-CM | POA: Insufficient documentation

## 2018-06-24 DIAGNOSIS — I4891 Unspecified atrial fibrillation: Secondary | ICD-10-CM

## 2018-06-24 DIAGNOSIS — Z8249 Family history of ischemic heart disease and other diseases of the circulatory system: Secondary | ICD-10-CM | POA: Insufficient documentation

## 2018-06-24 DIAGNOSIS — Z79899 Other long term (current) drug therapy: Secondary | ICD-10-CM | POA: Insufficient documentation

## 2018-06-24 DIAGNOSIS — I480 Paroxysmal atrial fibrillation: Secondary | ICD-10-CM | POA: Diagnosis present

## 2018-06-24 DIAGNOSIS — M109 Gout, unspecified: Secondary | ICD-10-CM | POA: Insufficient documentation

## 2018-06-24 DIAGNOSIS — F17210 Nicotine dependence, cigarettes, uncomplicated: Secondary | ICD-10-CM | POA: Insufficient documentation

## 2018-06-24 DIAGNOSIS — I493 Ventricular premature depolarization: Secondary | ICD-10-CM | POA: Insufficient documentation

## 2018-06-24 HISTORY — DX: Paroxysmal atrial fibrillation: I48.0

## 2018-06-24 LAB — BASIC METABOLIC PANEL
Anion gap: 9 (ref 5–15)
BUN: 13 mg/dL (ref 6–20)
CO2: 20 mmol/L — ABNORMAL LOW (ref 22–32)
Calcium: 8.8 mg/dL — ABNORMAL LOW (ref 8.9–10.3)
Chloride: 104 mmol/L (ref 98–111)
Creatinine, Ser: 0.67 mg/dL (ref 0.44–1.00)
GFR calc Af Amer: >60 mL/min (ref >60)
GFR calc non Af Amer: >60 mL/min (ref >60)
Glucose, Bld: 162 mg/dL — ABNORMAL HIGH (ref 70–99)
Potassium: 3.8 mmol/L (ref 3.5–5.1)
Sodium: 133 mmol/L — ABNORMAL LOW (ref 135–145)

## 2018-06-24 LAB — CBC WITH DIFFERENTIAL/PLATELET
Abs Immature Granulocytes: 0.01 10*3/uL (ref 0.00–0.07)
Basophils Absolute: 0.1 10*3/uL (ref 0.0–0.1)
Basophils Relative: 1 %
Eosinophils Absolute: 0.2 10*3/uL (ref 0.0–0.5)
Eosinophils Relative: 3 %
HCT: 37 % (ref 36.0–46.0)
Hemoglobin: 11.3 g/dL — ABNORMAL LOW (ref 12.0–15.0)
Immature Granulocytes: 0 %
Lymphocytes Relative: 54 %
Lymphs Abs: 4.4 10*3/uL — ABNORMAL HIGH (ref 0.7–4.0)
MCH: 22.7 pg — ABNORMAL LOW (ref 26.0–34.0)
MCHC: 30.5 g/dL (ref 30.0–36.0)
MCV: 74.4 fL — ABNORMAL LOW (ref 80.0–100.0)
Monocytes Absolute: 0.6 10*3/uL (ref 0.1–1.0)
Monocytes Relative: 7 %
Neutro Abs: 2.9 10*3/uL (ref 1.7–7.7)
Neutrophils Relative %: 35 %
Platelets: 485 10*3/uL — ABNORMAL HIGH (ref 150–400)
RBC: 4.97 MIL/uL (ref 3.87–5.11)
RDW: 17.6 % — ABNORMAL HIGH (ref 11.5–15.5)
WBC: 8.2 10*3/uL (ref 4.0–10.5)
nRBC: 0 % (ref 0.0–0.2)

## 2018-06-24 LAB — MRSA PCR SCREENING: MRSA by PCR: NEGATIVE

## 2018-06-24 MED ORDER — DILTIAZEM HCL 100 MG IV SOLR
5.0000 mg/h | INTRAVENOUS | Status: DC
  Administered 2018-06-24: 16:00:00 5 mg/h via INTRAVENOUS

## 2018-06-24 MED ORDER — DILTIAZEM HCL-DEXTROSE 100-5 MG/100ML-% IV SOLN (PREMIX): 5.0000 mg/h | INTRAVENOUS | Status: DC

## 2018-06-24 MED ORDER — APIXABAN 5 MG PO TABS
5.0000 mg | ORAL_TABLET | Freq: Two times a day (BID) | ORAL | Status: DC
  Administered 2018-06-24 – 2018-06-25 (×2): 5 mg via ORAL
  Filled 2018-06-24: qty 1
  Filled 2018-06-24: qty 2

## 2018-06-24 MED ORDER — ONDANSETRON HCL 4 MG/2ML IJ SOLN: 4.0000 mg | Freq: Four times a day (QID) | INTRAMUSCULAR | Status: DC | PRN

## 2018-06-24 MED ORDER — DILTIAZEM HCL ER COATED BEADS 120 MG PO CP24: 120.0000 mg | ORAL_CAPSULE | Freq: Every day | ORAL | Status: DC

## 2018-06-24 MED ORDER — DILTIAZEM HCL ER COATED BEADS 240 MG PO CP24
240.0000 mg | ORAL_CAPSULE | Freq: Every day | ORAL | Status: DC
  Administered 2018-06-25: 09:00:00 240 mg via ORAL
  Filled 2018-06-24: qty 1

## 2018-06-24 MED ORDER — DILTIAZEM HCL 60 MG PO TABS
60.0000 mg | ORAL_TABLET | Freq: Once | ORAL | Status: AC
  Administered 2018-06-24: 22:00:00 60 mg via ORAL
  Filled 2018-06-24: qty 1

## 2018-06-24 MED ORDER — DILTIAZEM HCL 100 MG IV SOLR
INTRAVENOUS | Status: AC
  Filled 2018-06-24: qty 100

## 2018-06-24 MED ORDER — DILTIAZEM LOAD VIA INFUSION
10.0000 mg | Freq: Once | INTRAVENOUS | Status: AC
  Administered 2018-06-24: 16:00:00 10 mg via INTRAVENOUS
  Filled 2018-06-24: qty 10

## 2018-06-24 MED ORDER — ACETAMINOPHEN 325 MG PO TABS: 650.0000 mg | ORAL_TABLET | ORAL | Status: DC | PRN

## 2018-06-24 NOTE — H&P (Signed)
Cardiology History & Physical    Patient ID: Emma Stephens MRN: [id number], DOB: 02-May-1970 Date of Encounter: 06/24/2018, 7:24 PM Primary Physician: System, Pcp Not In Primary Cardiologist: No primary care provider on file. Primary Electrophysiologist:  None  Chief Complaint: palpitations Reason for Admission: atrial fibrillation Requesting MD: Malvin Johns MD  HPI: Emma Stephens is a 48 y.o. female with history of hypertension who presents with new onset atrial fibrillation.  Patient had the onset of palpitations this morning which sustained throughout the day.  She also felt a little short of breath and lightheaded at times.  She went to the ED in [facility] [place] for further evaluation was found to be in atrial fibrillation with a heart rate of around 120.  She was given IV diltiazem and started on an infusion and transferred to [facility] for further evaluation.  On arrival here her heart rate is 60s to 70s still in atrial fibrillation on diltiazem at 5 mg/h.  She still feels some palpitations but it is markedly improved now that her heart rate is better.  She denies any chest pain today.  Regarding her history, she has hypertension and takes metoprolol 25 mg twice daily.  She has had PVCs in the past.  The burden of these has not been quantified.  She was possibly symptomatic when she wore an event monitor and the metoprolol was started for this.  She had an echo in 2016 with a normal LVEF and mild LVH.  Nuclear stress was also done and demonstrated no ischemia.  She wore a 30-day event monitor which showed occasional PVCs, but these could not be quantified as this was not a Holter monitor.   Past Medical History:  Diagnosis Date  . Cigarette nicotine dependence   . Gout 2011  . Hypertension   . PVC (premature ventricular contraction)      Surgical History:  Past Surgical History:  Procedure Laterality Date  . NECK SURGERY    . TUBAL LIGATION       Home Meds: Prior to  Admission medications   Medication Sig Start Date End Date Taking? Authorizing Provider  ALPRAZolam (XANAX) 0.25 MG tablet Take 1 tablet (0.25 mg total) by mouth 2 (two) times daily. 01/14/16   Brunetta Jeans, PA-C  aspirin EC 81 MG EC tablet Take 1 tablet (81 mg total) by mouth daily. 10/23/14   Janece Canterbury, MD  CHANTIX 1 MG tablet TAKE 1 TABLET BY MOUTH 2 TIMES DAILY Patient not taking: Reported on 10/28/2015 06/30/15   Brunetta Jeans, PA-C  cholecalciferol (VITAMIN D) 1000 units tablet Take 1,000 Units by mouth daily.    [provider]  Cyanocobalamin (VITAMIN B-12) 2500 MCG SUBL Place 5,000 mcg under the tongue daily. Reported on 04/18/2015    [provider]  cyclobenzaprine (FLEXERIL) 10 MG tablet Take 1 tablet (10 mg total) by mouth 3 (three) times daily as needed for muscle spasms. 10/28/15   Duffy Bruce, MD  HYDROcodone-acetaminophen (NORCO/VICODIN) 5-325 MG tablet Take 1 tablet by mouth every 6 (six) hours as needed for severe pain. 10/28/15   Duffy Bruce, MD  metoprolol (LOPRESSOR) 50 MG tablet Take 1 tablet (50 mg total) by mouth 2 (two) times daily. NEED OV. 06/12/15   [name], MD  nitroGLYCERIN (NITROSTAT) 0.4 MG SL tablet Place 1 tablet (0.4 mg total) under the tongue every 5 (five) minutes x 3 doses as needed for chest pain. 10/23/14   Janece Canterbury, MD    Allergies:  Allergies  Allergen Reactions  . Lisinopril Hives and Swelling  . Wellbutrin [Bupropion] Itching    Social History   Socioeconomic History  . Marital status: Married    Spouse name: Not on file  . Number of children: Not on file  . Years of education: Not on file  . Highest education level: Not on file  Occupational History  . Occupation: Stage manager: [employer]  . Financial resource strain: Not on file  . Food insecurity:    Worry: Not on file    Inability: Not on file  . Transportation needs:    Medical: Not on file    Non-medical: Not on file   Tobacco Use  . Smoking status: Current Every Day Smoker    Packs/day: 1.00    Years: 31.00    Pack years: 31.00    Types: Cigarettes  . Smokeless tobacco: Never Used  Substance and Sexual Activity  . Alcohol use: No    Alcohol/week: 0.0 standard drinks  . Drug use: No  . Sexual activity: Yes    Partners: Male  Lifestyle  . Physical activity:    Days per week: Not on file    Minutes per session: Not on file  . Stress: Not on file  Relationships  . Social connections:    Talks on phone: Not on file    Gets together: Not on file    Attends religious service: Not on file    Active member of club or organization: Not on file    Attends meetings of clubs or organizations: Not on file    Relationship status: Not on file  . Intimate partner violence:    Fear of current or ex partner: Not on file    Emotionally abused: Not on file    Physically abused: Not on file    Forced sexual activity: Not on file  Other Topics Concern  . Not on file  Social History Narrative   Lives in [place] with family.     Family History  Problem Relation Age of Onset  . Diabetes Mother   . Hypertension Mother   . Cancer Father 58       oral  . Diabetes Sister   . Hypertension Sister   . Diabetes Brother   . Hypertension Brother   . Sudden Cardiac Death Neg Hx   . Heart attack Neg Hx     Review of Systems: General: negative for chills, fever, night sweats or weight changes.  Cardiovascular: Positive for palpitations.  Negative for chest pain Dermatological: negative for rash Respiratory: negative for cough or wheezing Urologic: negative for hematuria Abdominal: negative for nausea, vomiting, diarrhea, bright red blood per rectum, melena, or hematemesis Neurologic: negative for visual changes, syncope, or dizziness All other systems reviewed and are otherwise negative except as noted above.  Labs:   Lab Results  Component Value Date   WBC 8.2 06/24/2018   HGB 11.3 (L) 06/24/2018    HCT 37.0 06/24/2018   MCV 74.4 (L) 06/24/2018   PLT 485 (H) 06/24/2018    Recent Labs  Lab 06/24/18 1514  NA 133*  K 3.8  CL 104  CO2 20*  BUN 13  CREATININE 0.67  CALCIUM 8.8*  GLUCOSE 162*   No results for input(s): CKTOTAL, CKMB, TROPONINI in the last 72 hours. Lab Results  Component Value Date   CHOL 156 07/30/2014   HDL 40.60 07/30/2014   LDLCALC 104 (H) 07/30/2014  TRIG 59.0 07/30/2014   Lab Results  Component Value Date   DDIMER <0.27 10/22/2014    Radiology/Studies:  No results found. Wt Readings from Last 3 Encounters:  06/24/18 74.2 kg  10/28/15 76.2 kg  05/07/15 78.6 kg    Physical Exam: Blood pressure (!) 137/111, pulse (!) 53, temperature 99 F (37.2 C), temperature source Oral, resp. rate 14, height 5' 1.42" (1.56 m), weight 74.2 kg, last menstrual period 06/13/2018, SpO2 100 %. Body mass index is 30.49 kg/m. General: Well developed, well nourished, in no acute distress. Head: Normocephalic, atraumatic, sclera non-icteric, no xanthomas, nares are without discharge.  Neck: Negative for carotid bruits. JVD not elevated. Lungs: Not examined Heart: Not examined Abdomen: Nondistended Msk:  Strength and tone appear normal for age. Extremities: No clubbing or cyanosis. No edema.  Neuro: Alert and oriented X 3. No focal deficit. No facial asymmetry. Moves all extremities spontaneously. Psych:  Responds to questions appropriately with a normal affect.        Assessment and Plan   1.  Atrial fibrillation with rapid ventricular response New onset atrial fibrillation.  Rate now in the 70s on IV diltiazem.  We will plan to stop IV diltiazem and transition to oral diltiazem.  She has been on metoprolol for PVCs and hypertension.  I will stop this for now as it did not seem to be effective for rate control.  We could consider just increasing the metoprolol, however she may have side effects with high-dose beta-blocker given her young age, and diltiazem  has seemed effective.  I think it would be reasonable to pursue a rate control strategy for now as she is very likely to convert to sinus rhythm on her own in the next few days.  She would probably need a TEE if cardioversion were planned as we cannot be certain that she has not been having A. fib outside of this episode.  I do not think there is a strong indication for cardioversion at this point given her good rate control at the moment.  Her CHADS-VASC is 2 due to hypertension and gender.  Technically per guidelines, anticoagulation is indicated for CHADS-VASC of 3 in women (see above).  Therefore she could probably get by without full dose anticoagulation and possibly just an aspirin.  This would not be a bad plan, especially because she does not have health insurance and therefore getting a DOAC would be difficult.  Since she might need cardioversion if she does not convert on her own, I will start her on apixaban tonight with the hope that she could get 30 days free as an outpatient.  -- stop diltiazem infusion -- start diltiazem 240 mg XR, first dose tomorrow.  Give 60 mg IR now -- start apixaban 5 mg PO BID in case TEE/DCCV is needed (note patient has no insurance - will need assistance program if to be continued - can consider aspirin 81 given CHADS-VASc=2)  2. Hypertension Her hypertension is likely to be worse off of metoprolol.  Would consider addition of an ACE inhibitor if blood pressure remains elevated tomorrow.    Severity of Illness: The appropriate patient status for this patient is OBSERVATION. Observation status is judged to be reasonable and necessary in order to provide the required intensity of service to ensure the patient's safety. The patient's presenting symptoms, physical exam findings, and initial radiographic and laboratory data in the context of their medical condition is felt to place them at decreased risk for further clinical deterioration.  Furthermore, it is  anticipated that the patient will be medically stable for discharge from the hospital within 2 midnights of admission. The following factors support the patient status of observation.   " The patient's presenting symptoms include palpitations. " The physical exam findings include none. " The initial radiographic and laboratory data are atrial fibrillation.     For questions or updates, please contact [place] Please consult [email or web address] for contact info under Cardiology/STEMI.  Liliane Bade, MD 06/24/2018, 7:24 PM

## 2018-06-24 NOTE — ED Notes (Signed)
Attempted to call report to Anthony- the accepting nurse will call me back.

## 2018-06-24 NOTE — ED Notes (Signed)
Pt denies chest pain on assessment but reports episode earlier today. Pt reports feeling very fatigued and lightheaded. Hx of afib.

## 2018-06-24 NOTE — ED Provider Notes (Signed)
[facility] DEPARTMENT Provider Note   CSN: [id number] Arrival date & time: 06/24/18  1457    History   Chief Complaint Chief Complaint  Patient presents with  . Palpitations    HPI Emma Stephens is a 48 y.o. female.     Patient is a 48 year old female with a history of hypertension and PVCs who presents with palpitations.  She states she is had a history of palpitations in the past and saw Dr. Caryl Comes with [facility] for palpitations in 2016.  At that time she had an event monitor which was showing PVCs.  She states this morning she felt an irregular heartbeat and its been bothering her all day.  She has had some tightness in her chest associated with the racing heartbeat and some shortness of breath.  She has some dizziness at times.  She thinks it started this morning but she has been feeling the irregular heartbeat for the last several days.  She initially thought it was just PVCs but it got more persistent and symptomatic today.  She denies any leg pain or swelling.  No fevers.  No cough or cold symptoms.     Past Medical History:  Diagnosis Date  . Cigarette nicotine dependence   . Gout 2011  . Hypertension   . PVC (premature ventricular contraction)     Patient Active Problem List   Diagnosis Date Noted  . Pruritus 04/20/2015  . Anxiety state 12/11/2014  . Symptomatic PVCs 10/22/2014  . Cigarette nicotine dependence 10/22/2014  . Acute gout 07/30/2014  . Visit for preventive health examination 07/30/2014  . Essential hypertension, benign 05/27/2014  . Recurrent knee pain 05/27/2014    Past Surgical History:  Procedure Laterality Date  . NECK SURGERY    . TUBAL LIGATION       OB History   No obstetric history on file.      Home Medications    Prior to Admission medications   Medication Sig Start Date End Date Taking? Authorizing Provider  ALPRAZolam (XANAX) 0.25 MG tablet Take 1 tablet (0.25 mg total)  by mouth 2 (two) times daily. 01/14/16   Brunetta Jeans, PA-C  aspirin EC 81 MG EC tablet Take 1 tablet (81 mg total) by mouth daily. 10/23/14   Janece Canterbury, MD  CHANTIX 1 MG tablet TAKE 1 TABLET BY MOUTH 2 TIMES DAILY Patient not taking: Reported on 10/28/2015 06/30/15   Brunetta Jeans, PA-C  cholecalciferol (VITAMIN D) 1000 units tablet Take 1,000 Units by mouth daily.    [provider]  Cyanocobalamin (VITAMIN B-12) 2500 MCG SUBL Place 5,000 mcg under the tongue daily. Reported on 04/18/2015    [provider]  cyclobenzaprine (FLEXERIL) 10 MG tablet Take 1 tablet (10 mg total) by mouth 3 (three) times daily as needed for muscle spasms. 10/28/15   Duffy Bruce, MD  HYDROcodone-acetaminophen (NORCO/VICODIN) 5-325 MG tablet Take 1 tablet by mouth every 6 (six) hours as needed for severe pain. 10/28/15   Duffy Bruce, MD  metoprolol (LOPRESSOR) 50 MG tablet Take 1 tablet (50 mg total) by mouth 2 (two) times daily. NEED OV. 06/12/15   [name], MD  nitroGLYCERIN (NITROSTAT) 0.4 MG SL tablet Place 1 tablet (0.4 mg total) under the tongue every 5 (five) minutes x 3 doses as needed for chest pain. 10/23/14   Janece Canterbury, MD    Family History Family History  Problem Relation Age of Onset  . Diabetes Mother   .  Hypertension Mother   . Cancer Father 80       oral  . Diabetes Sister   . Hypertension Sister   . Diabetes Brother   . Hypertension Brother   . Sudden Cardiac Death Neg Hx   . Heart attack Neg Hx     Social History Social History   Tobacco Use  . Smoking status: Current Every Day Smoker    Packs/day: 1.00    Years: 31.00    Pack years: 31.00    Types: Cigarettes  . Smokeless tobacco: Never Used  Substance Use Topics  . Alcohol use: No    Alcohol/week: 0.0 standard drinks  . Drug use: No     Allergies   Lisinopril and Wellbutrin [bupropion]   Review of Systems Review of Systems  Constitutional: Negative for chills, diaphoresis,  fatigue and fever.  HENT: Negative for congestion, rhinorrhea and sneezing.   Eyes: Negative.   Respiratory: Positive for chest tightness and shortness of breath. Negative for cough.   Cardiovascular: Positive for chest pain and palpitations. Negative for leg swelling.  Gastrointestinal: Negative for abdominal pain, blood in stool, diarrhea, nausea and vomiting.  Genitourinary: Negative for difficulty urinating, flank pain, frequency and hematuria.  Musculoskeletal: Negative for arthralgias and back pain.  Skin: Negative for rash.  Neurological: Positive for light-headedness. Negative for dizziness, speech difficulty, weakness, numbness and headaches.     Physical Exam Updated Vital Signs BP (!) 152/102   Pulse 82   Temp 98.7 F (37.1 C) (Oral)   Resp 18   Ht 5\' 1"  (1.549 m)   Wt 73.9 kg   LMP 06/13/2018   SpO2 100%   BMI 30.80 kg/m   Physical Exam Constitutional:      Appearance: She is well-developed.  HENT:     Head: Normocephalic and atraumatic.  Eyes:     Pupils: Pupils are equal, round, and reactive to light.  Neck:     Musculoskeletal: Normal range of motion and neck supple.  Cardiovascular:     Rate and Rhythm: Tachycardia present. Rhythm irregular.     Heart sounds: Normal heart sounds.  Pulmonary:     Effort: Pulmonary effort is normal. No respiratory distress.     Breath sounds: Normal breath sounds. No wheezing or rales.  Chest:     Chest wall: No tenderness.  Abdominal:     General: Bowel sounds are normal.     Palpations: Abdomen is soft.     Tenderness: There is no abdominal tenderness. There is no guarding or rebound.  Musculoskeletal: Normal range of motion.     Right lower leg: No edema.     Left lower leg: No edema.  Lymphadenopathy:     Cervical: No cervical adenopathy.  Skin:    General: Skin is warm and dry.     Findings: No rash.  Neurological:     Mental Status: She is alert and oriented to person, place, and time.      ED  Treatments / Results  Labs (all labs ordered are listed, but only abnormal results are displayed) Labs Reviewed  BASIC METABOLIC PANEL - Abnormal; Notable for the following components:      Result Value   Sodium 133 (*)    CO2 20 (*)    Glucose, Bld 162 (*)    Calcium 8.8 (*)    All other components within normal limits  CBC WITH DIFFERENTIAL/PLATELET - Abnormal; Notable for the following components:   Hemoglobin 11.3 (*)  MCV 74.4 (*)    MCH 22.7 (*)    RDW 17.6 (*)    Platelets 485 (*)    Lymphs Abs 4.4 (*)    All other components within normal limits    EKG EKG Interpretation  Date/Time:  Saturday June 24 2018 15:03:13 EDT Ventricular Rate:  120 PR Interval:    QRS Duration: 73 QT Interval:  306 QTC Calculation: 433 R Axis:   54 Text Interpretation:  Atrial fibrillation Anterior infarct, old Minimal ST depression, inferior leads Baseline wander in lead(s) II Confirmed by Malvin Johns [phone]) on 06/24/2018 3:09:41 PM   Radiology No results found.  Procedures Procedures (including critical care time)  Medications Ordered in ED Medications  diltiazem (CARDIZEM) 1 mg/mL load via infusion 10 mg (10 mg Intravenous Bolus from Bag 06/24/18 1540)    And  diltiazem (CARDIZEM) 100 mg in dextrose 5 % 100 mL (1 mg/mL) infusion (5 mg/hr Intravenous New Bag/Given 06/24/18 1539)  diltiazem (CARDIZEM) 100 MG injection (has no administration in time range)     Initial Impression / Assessment and Plan / ED Course  I have reviewed the triage vital signs and the nursing notes.  Pertinent labs & imaging results that were available during my care of the patient were reviewed by me and considered in my medical decision making (see chart for details).       Patient is a 48 year old female who presents with palpitations.  She has found to be in atrial fibrillation which is new onset.  There is no ischemic changes noted on EKG.  She was started on Cardizem which has controlled her  rate into the 80s and 90s.  She is currently on Cardizem drip.  She did not meet criteria for cardioversion given that she is been feeling this for the last 3 to 4 days.  She could not tell me definitively that it started this morning.  I spoke with Dr. Debara Pickett with cardiology who is accepted the patient for transfer to [facility].  Her labs are reviewed and are non-concerning.  Her glucose is elevated.  CRITICAL CARE Performed by: Malvin Johns Total critical care time: 45 minutes Critical care time was exclusive of separately billable procedures and treating other patients. Critical care was necessary to treat or prevent imminent or life-threatening deterioration. Critical care was time spent personally by me on the following activities: development of treatment plan with patient and/or surrogate as well as nursing, discussions with consultants, evaluation of patient's response to treatment, examination of patient, obtaining history from patient or surrogate, ordering and performing treatments and interventions, ordering and review of laboratory studies, ordering and review of radiographic studies, pulse oximetry and re-evaluation of patient's condition.    Final Clinical Impressions(s) / ED Diagnoses   Final diagnoses:  Atrial fibrillation with RVR [facility])    ED Discharge Orders    None       Malvin Johns, MD 06/24/18 1625

## 2018-06-24 NOTE — ED Triage Notes (Signed)
Pt c/o palpitations today. Denies chest pain. Also reports high BP readings at home.

## 2018-06-24 NOTE — ED Notes (Signed)
Report to [facility], RN at [facility]

## 2018-06-25 ENCOUNTER — Encounter (HOSPITAL_COMMUNITY): Payer: Self-pay | Admitting: Cardiology

## 2018-06-25 ENCOUNTER — Other Ambulatory Visit: Payer: Self-pay | Admitting: Cardiology

## 2018-06-25 DIAGNOSIS — I480 Paroxysmal atrial fibrillation: Secondary | ICD-10-CM

## 2018-06-25 LAB — BASIC METABOLIC PANEL
Anion gap: 10 (ref 5–15)
BUN: 10 mg/dL (ref 6–20)
CO2: 20 mmol/L — ABNORMAL LOW (ref 22–32)
Calcium: 8.9 mg/dL (ref 8.9–10.3)
Chloride: 107 mmol/L (ref 98–111)
Creatinine, Ser: 0.66 mg/dL (ref 0.44–1.00)
GFR calc Af Amer: >60 mL/min (ref >60)
GFR calc non Af Amer: >60 mL/min (ref >60)
Glucose, Bld: 87 mg/dL (ref 70–99)
Potassium: 4.1 mmol/L (ref 3.5–5.1)
Sodium: 137 mmol/L (ref 135–145)

## 2018-06-25 LAB — TSH: TSH: 0.953 u[IU]/mL (ref 0.350–4.500)

## 2018-06-25 LAB — T4, FREE: Free T4: 0.84 ng/dL (ref 0.82–1.77)

## 2018-06-25 MED ORDER — DILTIAZEM HCL ER COATED BEADS 240 MG PO CP24: 240.0000 mg | ORAL_CAPSULE | Freq: Every day | ORAL | 4 refills | Status: DC

## 2018-06-25 MED ORDER — APIXABAN 5 MG PO TABS: 5.0000 mg | ORAL_TABLET | Freq: Two times a day (BID) | ORAL | 4 refills | Status: DC

## 2018-06-25 NOTE — Progress Notes (Addendum)
Progress Note  Patient Name: Emma Stephens Date of Encounter: 06/25/2018  Primary Cardiologist: Virl Axe, MD   Subjective   No CP or dyspnea  Inpatient Medications    Scheduled Meds: . apixaban  5 mg Oral BID  . diltiazem  240 mg Oral Daily   Continuous Infusions:  PRN Meds: acetaminophen, ondansetron (ZOFRAN) IV   Vital Signs    Vitals:   06/24/18 1851 06/24/18 2000 06/25/18 0300 06/25/18 0748  BP: (!) 137/111 124/78 135/79 126/72  Pulse: (!) 53 (!) 55 72   Resp: 14 16 16 20   Temp: 99 F (37.2 C) 98.5 F (36.9 C) 98.2 F (36.8 C) 98.5 F (36.9 C)  TempSrc: Oral Oral Oral Oral  SpO2: 100% 100% 100% 98%  Weight: 74.2 kg     Height: 5' 1.42" (1.56 m)       Intake/Output Summary (Last 24 hours) at 06/25/2018 0800 Last data filed at 06/25/2018 0000 Gross per 24 hour  Intake 291.44 ml  Output -  Net 291.44 ml   Last 3 Weights 06/24/2018 06/24/2018 10/28/2015  Weight (lbs) 163 lb 9.3 oz 163 lb 168 lb  Weight (kg) 74.2 kg 73.936 kg 76.204 kg      Telemetry    Converted to SR around 0100 today - Personally Reviewed  ECG    EKG SR with poor R wave progression  - Personally Reviewed  Physical Exam   GEN: No acute distress.   Neck: No JVD Cardiac: RRR, no murmurs, rubs, or gallops.  Respiratory: Clear to auscultation bilaterally. GI: Soft, nontender, non-distended  MS: No edema Neuro:  Nonfocal  Psych: Normal affect   Labs    Chemistry Recent Labs  Lab 06/24/18 1514  NA 133*  K 3.8  CL 104  CO2 20*  GLUCOSE 162*  BUN 13  CREATININE 0.67  CALCIUM 8.8*  GFRNONAA >60  GFRAA >60  ANIONGAP 9     Hematology Recent Labs  Lab 06/24/18 1514  WBC 8.2  RBC 4.97  HGB 11.3*  HCT 37.0  MCV 74.4*  MCH 22.7*  MCHC 30.5  RDW 17.6*  PLT 485*    Cardiac Studies   None since 2016  Stress test 2016 Study Highlights    Nuclear stress EF: 59%.  There was no ST segment deviation noted during stress.  The study is normal. No ischemia.   This is a low risk study.    Echo  2016 Study Conclusions  - Left ventricle: The cavity size was normal. Wall thickness was   increased in a pattern of mild LVH. Systolic function was normal.   The estimated ejection fraction was in the range of 60% to 65%.   Wall motion was normal; there were no regional wall motion   abnormalities.   Patient Profile     48 y.o. female with hx of HTN, PVCs  and admitted with new a fib.    Assessment & Plan    A fib with RVR rate 120 - placed on IV dilt.  With HR controlled changed to po dilt. Labs yesterday stable will check thyroid   --converted to SR around 0100 will do EKG in SR  --D/c home and follow up telehealth if Dr. Haroldine Laws agrees --? Echo here vs outpt   Anticoagulation with CHA2DS2Vasc of 2 placed on apixaban - MD to see possible.  Pt does not have insurance if remains on apixaban will need assistance   HTN  She has been on BB but stopped  with dilt.  cuttently BP 126/72           For questions or updates, please contact [place] Please consult [email or web address] for contact info under        Signed, Cecilie Kicks, NP  06/25/2018, 8:00 AM   As above, patient seen and examined.  She denies chest pain, dyspnea or palpitations this morning.  She converted to sinus rhythm overnight.  Await TSH.  Continue Cardizem CD 240 mg daily for rate control if atrial fibrillation recurs (lopressor DCed).  If she has more frequent episodes in the future can consider antiarrhythmic versus referral for ablation.  We will arrange outpatient echocardiogram. CHADSvasc 2.  Continue apixaban at discharge.  Will arrange telephone visit in 2 weeks and follow-up with Dr. Caryl Comes in 3 months.  Patient counseled on discontinuing tobacco use.  Note she has a mild microcytic anemia but still menstruates.  Greater than 30 minutes PA and physician time. D2  Kirk Ruths, MD

## 2018-06-25 NOTE — Progress Notes (Signed)
Patient converted into NSR around midnight and has sustained NSR through out the night/morning. Will continue to monitor progress.

## 2018-06-25 NOTE — Discharge Instructions (Signed)

## 2018-06-25 NOTE — Progress Notes (Signed)
Pt got discharged to home, discharge instructions provided and patient showed understanding to it, IV taken out,Telemonitor DC,pt left unit in wheelchair with all of the belongings accompanied with a family member (Husband) Monaye Blackie,RN  

## 2018-06-25 NOTE — Discharge Summary (Signed)
Discharge Summary    Patient ID: Dot Splinter MRN: [id number]; DOB: 1971-01-26  Admit date: 06/24/2018 Discharge date: 06/25/2018  Primary Care Provider: System, Pcp Not In  Primary Cardiologist: Virl Axe, MD  Primary Electrophysiologist:  None  Discharge Diagnoses    Principal Problem:   Atrial fibrillation [facility]) Active Problems:   Essential hypertension, benign   Allergies Allergies  Allergen Reactions  . Lisinopril Hives and Swelling  . Wellbutrin [Bupropion] Itching    Diagnostic Studies/Procedures    N/a _____________   History of Present Illness     Emma Stephens is a 48 y.o. female with history of hypertension who presented to [facility] with new onset atrial fibrillation.  Patient had the onset of palpitations the morning of admission which sustained throughout the day.  She also felt a little short of breath and lightheaded at times.  She went to the ED in [facility] for further evaluation was found to be in atrial fibrillation with a heart rate of around 120.  She was given IV diltiazem and started on an infusion and transferred to [facility] for further evaluation.  On arrival to [facility] her heart rate was in the 60s to 70s still in atrial fibrillation on diltiazem at 5 mg/h.  She still felt some palpitations but it was markedly improved with a slower HR.  She denied any chest pain.  Regarding her history, she has hypertension and took metoprolol 25 mg twice daily.  She has had PVCs in the past.  The burden of these has not been quantified.  She was possibly symptomatic when she wore an event monitor and the metoprolol was started for this.  She had an echo in 2016 with a normal LVEF and mild LVH.  Nuclear stress was also done and demonstrated no ischemia.  She wore a 30-day event monitor which showed occasional PVCs, but these could not be quantified as this was not a Holter monitor.  Hospital Course     Consultants: none   Pt was placed on Eliquis 5mg  BID  for CHA2DS2Vasc of 2.  She did convert around 0100 to SR on 06/25/2018.  TSH was 0.953.  Other labs WNL. She was transitioned to oral Cardizem 240mg  daily. Her home lopressor was stopped. If recurrent episodes of Afib in the future, could consider antiarrhythmic vs referral for ablation. Will plan to outpatient echo (message sent to the office to arrange). Counseled on the need for tobacco cessation. Will arrange for [facility] tele-health visit in 2 weeks, with long term follow up with Dr. Caryl Comes.   Seen by Dr. Stanford Breed and determined stable for discharge home. Follow up arranged.  _____________  Discharge Vitals Blood pressure 126/72, pulse 72, temperature 98.5 F (36.9 C), temperature source Oral, resp. rate 20, height 5' 1.42" (1.56 m), weight 74.2 kg, last menstrual period 06/13/2018, SpO2 98 %.  Filed Weights   06/24/18 1500 06/24/18 1851  Weight: 73.9 kg 74.2 kg    Labs & Radiologic Studies    CBC Recent Labs    06/24/18 1514  WBC 8.2  NEUTROABS 2.9  HGB 11.3*  HCT 37.0  MCV 74.4*  PLT 785*   Basic Metabolic Panel Recent Labs    06/24/18 1514 06/25/18 0821  NA 133* 137  K 3.8 4.1  CL 104 107  CO2 20* 20*  GLUCOSE 162* 87  BUN 13 10  CREATININE 0.67 0.66  CALCIUM 8.8* 8.9   Liver Function Tests No results for input(s): AST, ALT, ALKPHOS, BILITOT, PROT, ALBUMIN in  the last 72 hours. No results for input(s): LIPASE, AMYLASE in the last 72 hours. Cardiac Enzymes No results for input(s): CKTOTAL, CKMB, CKMBINDEX, TROPONINI in the last 72 hours. BNP Invalid input(s): POCBNP D-Dimer No results for input(s): DDIMER in the last 72 hours. Hemoglobin A1C No results for input(s): HGBA1C in the last 72 hours. Fasting Lipid Panel No results for input(s): CHOL, HDL, LDLCALC, TRIG, CHOLHDL, LDLDIRECT in the last 72 hours. Thyroid Function Tests Recent Labs    06/25/18 0821  TSH 0.953   _____________  No results found. Disposition   Pt is being discharged home today in good  condition.  Follow-up Plans & Appointments    Follow-up Information    [name], MD Follow up.   Specialty:  Cardiology Why:  Office will call you to arrange for a tele-health visit. This will be through your smart phone. Contact information: [address] [address] [phone]        [place] Office Follow up.   Specialty:  Cardiology Why:  Office will call to arrange for outpatient echocardiogram Contact information: [address] [place] [phone]         Discharge Instructions    Diet - low sodium heart healthy   Complete by:  As directed    Increase activity slowly   Complete by:  As directed       Discharge Medications   Allergies as of 06/25/2018      Reactions   Lisinopril Hives, Swelling   Wellbutrin [bupropion] Itching      Medication List    STOP taking these medications   aspirin 81 MG EC tablet   cyclobenzaprine 10 MG tablet Commonly known as:  FLEXERIL   metoprolol tartrate 50 MG tablet Commonly known as:  LOPRESSOR   naproxen sodium 220 MG tablet Commonly known as:  ALEVE   nitroGLYCERIN 0.4 MG SL tablet Commonly known as:  NITROSTAT     TAKE these medications   ALPRAZolam 0.25 MG tablet Commonly known as:  XANAX Take 1 tablet (0.25 mg total) by mouth 2 (two) times daily.   apixaban 5 MG Tabs tablet Commonly known as:  ELIQUIS Take 1 tablet (5 mg total) by mouth 2 (two) times daily.   diltiazem 240 MG 24 hr capsule Commonly known as:  CARDIZEM CD Take 1 capsule (240 mg total) by mouth daily. Start taking on:  June 26, 2018   HYDROcodone-acetaminophen 5-325 MG tablet Commonly known as:  NORCO/VICODIN Take 1 tablet by mouth every 6 (six) hours as needed for severe pain.   VITAMIN B-12 PO Take 1 tablet by mouth daily.   VITAMIN D PO Take 1 tablet by mouth daily.        Acute coronary syndrome (MI, NSTEMI, STEMI, etc) this  admission?: No.    Outstanding Labs/Studies   Outpatient echo.  Duration of Discharge Encounter   Greater than 30 minutes including physician time.  Signed, Reino Bellis, NP 06/25/2018, 10:55 AM

## 2018-06-25 NOTE — Care Management (Signed)
Pt discharged without Eliquis card.  30 day free Eliquis card faxed to pharmacy.  Pharmacist states patient filled prescription and paid $471 cash, but he will refund amount to patient when has card.

## 2018-06-27 ENCOUNTER — Other Ambulatory Visit: Payer: Self-pay

## 2018-06-27 ENCOUNTER — Encounter: Payer: Self-pay | Admitting: Physician Assistant

## 2018-06-27 ENCOUNTER — Ambulatory Visit (INDEPENDENT_AMBULATORY_CARE_PROVIDER_SITE_OTHER): Payer: Self-pay | Admitting: Physician Assistant

## 2018-06-27 ENCOUNTER — Telehealth: Payer: Self-pay | Admitting: Internal Medicine

## 2018-06-27 VITALS — BP 160/101 | HR 79 | Temp 98.7°F | Wt 163.0 lb

## 2018-06-27 DIAGNOSIS — I480 Paroxysmal atrial fibrillation: Secondary | ICD-10-CM

## 2018-06-27 DIAGNOSIS — I10 Essential (primary) hypertension: Secondary | ICD-10-CM

## 2018-06-27 MED ORDER — HYDROCHLOROTHIAZIDE 25 MG PO TABS: 25.0000 mg | ORAL_TABLET | Freq: Every day | ORAL | 1 refills | Status: DC

## 2018-06-27 MED ORDER — POTASSIUM CHLORIDE CRYS ER 20 MEQ PO TBCR: 20.0000 meq | EXTENDED_RELEASE_TABLET | Freq: Every day | ORAL | 3 refills | Status: DC

## 2018-06-27 MED ORDER — ALPRAZOLAM 0.25 MG PO TABS: 0.2500 mg | ORAL_TABLET | Freq: Two times a day (BID) | ORAL | 0 refills | Status: DC

## 2018-06-27 NOTE — Progress Notes (Signed)
Virtual Visit via Video Note  I connected with Novalie Brisbon on 06/27/18 at 10:00 AM EDT by a video enabled telemedicine application and verified that I am speaking with the correct person using two identifiers.   I discussed the limitations of evaluation and management by telemedicine and the availability of in person appointments. The patient expressed understanding and agreed to proceed.  History of Present Illness: Patient presents today via WebEx Video for hospital follow-up. Patient presented to [facility] on 06/24/2018 with complaints or palpitations, SOB and lightheadedness. ER workup included labs (mild hyponatremia at 133, Mild anemia with hgb 11.3 and increased MCV/RDW), EKG (atrial fibrillation with rate of 120 bpm). Patient was started on IV Diltiazem and transferred to [facility] for further evaluation. Upon admission she was noted to have improved heart rate (60-70s bpm) but still in atrial fibrillation. Patient placed on Eliguis 5 mg BID due to CHADSVASC score of 2. Converted to NSR on 06/25/2018. Remained stable in hospital. She was transitioned to oral Cardizem 240 mg daily. Lopressor stopped. Patient discharged home on 06/25/2018 to follow-up with Cardiology and PCP. Of note, not further inpatient assessment performed to recent Nuclear Stress testing. Is to be set up for outpatient Echo in the next few weeks.  Since discharge, patient endorses feeling well overall. Is taking medications as directed. Patient denies chest pain, palpitations, lightheadedness, dizziness, vision changes or frequent headaches. Notes she is keeping hydrated and following a Heart Healthy/DASH diet. Notes she is anxious about her health after getting this diagnosis but trying to remain calm. Sometimes she is having a panic as she is afraid if she moves around her heart will "go off". Was previously on Alprazolam PRN and is wondering if this could be refilled. She is concerned about her BP. Notes despite smoking  cessation, diet and Cardizem, BP is remaining elevated at home. Notes BP cuff is new. Husbands BP normal when checking. Her BP has been running 150-170/90-100 the past couple of days despite medication. Is wanting to discuss BP medication. Has not heard from Cardiology yet to schedule Hosp Follow-up.   Observations/Objective: Patient is well-developed, well-nourished in no acute distress.  Resting comfortably at home.  Head is normocephalic, atraumatic.  No labored breathing.  Speech is clear and coherent with logical contest.  Patient is alert and oriented at baseline.   Assessment and Plan: 1. Paroxysmal atrial fibrillation (HCC) Tolerating Eliquis and Cardizem. Good rate control. Asymptomatic. Continue current regimen. She is calling Cardiology to schedule her Hosp F/U today after her appointment with me. Will have her send MyChart message to inform us when this is scheduled. Continue regimen as directed by Cardiology. Tx for BP noted below.  2. Essential hypertension, benign BP moderately elevated. Discussed Cardizem has minimal effect on BP. Is mainly rate-control medication. Will restart HCTZ 25 mg daily and K-Dur 20 mEq daily. Check BP daily and record. Will follow-up with her Thursday via phone to assess how she is feeling and making sure no further increase in BP. Follow-up 10 days for Video visit and nurse visit for BP check if not seeing Cardiology before then. - hydrochlorothiazide (HYDRODIURIL) 25 MG tablet; Take 1 tablet (25 mg total) by mouth daily.  Dispense: 30 tablet; Refill: 1 - potassium chloride SA (K-DUR,KLOR-CON) 20 MEQ tablet; Take 1 tablet (20 mEq total) by mouth daily.  Dispense: 30 tablet; Refill: 3   Follow Up Instructions: She is calling Cardiology to schedule follow-up. Will follow-up via phone in 2 days. Will have her  come by office for formal BP check in 10 days.   I discussed the assessment and treatment plan with the patient. The patient was provided an  opportunity to ask questions and all were answered. The patient agreed with the plan and demonstrated an understanding of the instructions.   The patient was advised to call back or seek an in-person evaluation if the symptoms worsen or if the condition fails to improve as anticipated.   Leeanne Rio, PA-C

## 2018-06-27 NOTE — Progress Notes (Signed)
I have discussed the procedure for the virtual visit with the patient who has given consent to proceed with assessment and treatment.   Todd Jelinski S Gatha Mcnulty, CMA     

## 2018-06-27 NOTE — Telephone Encounter (Signed)
Pt last seen 2016. Pt will need to be scheduled as a new pt. I will route this back to scheduling to get pt scheduled appropriately.

## 2018-06-27 NOTE — Telephone Encounter (Signed)
Pt called because she was hospitalized recently for Afib. She was told to schedule a f/u visit with Dr. Caryl Comes. Her last office visit with Dr. Caryl Comes was 10/29/2014, and she saw Benjaman Pott on 11/24/2014.   This has been more than 3 years. She needs to be scheduled as a new patient , correct?

## 2018-06-27 NOTE — Patient Instructions (Signed)
Instructions sent to MyChart.   I am glad that overall you are feeling much better. Please continue the Cardizem and Eliquis as directed. I am adding on Hydrochlorothiazide (HCTZ) 25 mg daily to help with BP. I have also sent in a potassium supplement for you to take daily with this.   Keep following your low-sodium, heart healthy diet. Check BP 1-2 x daily and record.  I have sent in a refill of your Xanax to use as directed if needed for more acute anxiety. I recommend you download the Calm app on your phone and start some stress relief training.  I will be calling you on Thursday just to see how you are doing and to make sure there is no further increase in BP.  We will follow-up via video in 1 week to reassess.  At that time we will schedule you for BP check here at the office.   Let me know as soon as you have scheduled your follow-up with Cardiology.  If you note any further climb in blood pressure or any recurrence of palpitations, SOB, or noting any new chest pain, please return to the ER immediately.

## 2018-07-05 NOTE — Telephone Encounter (Signed)
Needs video visit Thursday or Friday for reassessment of hypertension. Please call and schedule patient. Thank you.

## 2018-07-07 ENCOUNTER — Other Ambulatory Visit: Payer: Self-pay

## 2018-07-07 ENCOUNTER — Ambulatory Visit (INDEPENDENT_AMBULATORY_CARE_PROVIDER_SITE_OTHER): Payer: Self-pay | Admitting: Physician Assistant

## 2018-07-07 ENCOUNTER — Encounter: Payer: Self-pay | Admitting: Physician Assistant

## 2018-07-07 VITALS — BP 153/101 | HR 89

## 2018-07-07 DIAGNOSIS — I480 Paroxysmal atrial fibrillation: Secondary | ICD-10-CM

## 2018-07-07 DIAGNOSIS — I10 Essential (primary) hypertension: Secondary | ICD-10-CM

## 2018-07-07 MED ORDER — METOPROLOL SUCCINATE ER 25 MG PO TB24: 25.0000 mg | ORAL_TABLET | Freq: Every day | ORAL | 3 refills | Status: DC

## 2018-07-07 NOTE — Patient Instructions (Signed)
Instructions sent to MyChart.  Giving prior reaction to ACEI (Lisinopril) will need to avoid medications like Losartan and Valsartan to be on the safe side. For now, continue the HCTZ 25 mg daily. I have sent in Toprol XL 25 mg for you to take daily. Unfortunately I will need you to continue the potassium supplement for now.  Keep up with diet and walking. Check BP daily and record. Follow-up with Dr. Caryl Comes on Monday as scheduled.

## 2018-07-07 NOTE — Progress Notes (Signed)
Virtual Visit via Video   I connected with patient on 07/07/18 at 10:40 AM EDT by a video enabled telemedicine application and verified that I am speaking with the correct person using two identifiers.  Location patient: Home Location provider: [facility], Office Persons participating in the virtual visit: Patient, Provider, [place] (Patina Moore)  I discussed the limitations of evaluation and management by telemedicine and the availability of in person appointments. The patient expressed understanding and agreed to proceed.  Subjective:   HPI:     Patient presents via Doxy[employer]me today for follow-up of hypertension. At last visit patient was started on HCTZ 25 mg daily for better control of BP. Is currently on regimen of 240 mg Cardizem and Eliquis 5 mg for her PAF. Endorses taking all medications as directed. Has noted improvement in BP but still high. Patient denies chest pain, palpitations, lightheadedness, dizziness, vision changes. Notes improvement in headache but still noting them on occasion with BP is higher.   BP Readings from Last 3 Encounters:  07/07/18 (!) 153/101  06/27/18 (!) 160/101  06/25/18 126/72   ROS:   See pertinent positives and negatives per HPI.  Patient Active Problem List   Diagnosis Date Noted  . Atrial fibrillation ([place]) 06/24/2018  . Pruritus 04/20/2015  . Anxiety state 12/11/2014  . Symptomatic PVCs 10/22/2014  . Cigarette nicotine dependence 10/22/2014  . Acute gout 07/30/2014  . Visit for preventive health examination 07/30/2014  . Essential hypertension, benign 05/27/2014  . Recurrent knee pain 05/27/2014    Social History   Tobacco Use  . Smoking status: Current Every Day Smoker    Packs/day: 0.50    Years: 31.00    Pack years: 15.50    Types: Cigarettes  . Smokeless tobacco: Never Used  Substance Use Topics  . Alcohol use: No    Alcohol/week: 0.0 standard drinks    Current Outpatient Medications:  .  ALPRAZolam (XANAX)  0.25 MG tablet, Take 1 tablet (0.25 mg total) by mouth 2 (two) times daily., Disp: 60 tablet, Rfl: 0 .  apixaban (ELIQUIS) 5 MG TABS tablet, Take 1 tablet (5 mg total) by mouth 2 (two) times daily., Disp: 60 tablet, Rfl: 4 .  cholecalciferol (VITAMIN D3) 25 MCG (1000 UT) tablet, Take 1,000 Units by mouth daily., Disp: , Rfl:  .  Cyanocobalamin (VITAMIN B-12 PO), Take 1 tablet by mouth daily., Disp: , Rfl:  .  diltiazem (CARDIZEM CD) 240 MG 24 hr capsule, Take 1 capsule (240 mg total) by mouth daily., Disp: 30 capsule, Rfl: 4 .  hydrochlorothiazide (HYDRODIURIL) 25 MG tablet, Take 1 tablet (25 mg total) by mouth daily., Disp: 30 tablet, Rfl: 1 .  potassium chloride SA (K-DUR,KLOR-CON) 20 MEQ tablet, Take 1 tablet (20 mEq total) by mouth daily., Disp: 30 tablet, Rfl: 3  Allergies  Allergen Reactions  . Lisinopril Hives and Swelling  . Wellbutrin [Bupropion] Itching    Objective:   LMP 06/13/2018   Patient is well-developed, well-nourished in no acute distress.  Resting comfortably at home.  Head is normocephalic, atraumatic.  No labored breathing.  Speech is clear and coherent with logical contest.  Patient is alert and oriented at baseline.   Assessment and Plan:   1. Paroxysmal atrial fibrillation ([place]) Taking her Cardizem and Eliquis as directed. Rate-controlled. Asymptomatic. Continue current regimen. Has scheduled follow-up with Dr. Caryl Comes Monday.  2. Essential hypertension, benign Above goal with mild headache. Has angioedema with ACEI. Will add on Toprol XL 25 mg to her  current regimen of HCTZ 25 mg daily. Continue potassium supplement, DASH/HH diet. Check BP daily. Will review her BP level at follow-up with Cardiology on Monday. Will schedule follow-up here based on those numbers.    Leeanne Rio, PA-C 07/07/2018  Time spent with the patient: 15 minutes, of which >50% was spent in obtaining information about symptoms, reviewing previous labs, evaluations, and  treatments, counseling about condition (please see the discussed topics above), and developing a plan to further investigate it; had a number of questions which I addressed.

## 2018-07-07 NOTE — Progress Notes (Signed)
I have discussed the procedure for the virtual visit with the patient who has given consent to proceed with assessment and treatment.   Fergie Sherbert S Talitha Dicarlo, CMA     

## 2018-07-10 ENCOUNTER — Telehealth (INDEPENDENT_AMBULATORY_CARE_PROVIDER_SITE_OTHER): Payer: Self-pay | Admitting: Internal Medicine

## 2018-07-10 ENCOUNTER — Other Ambulatory Visit: Payer: Self-pay

## 2018-07-10 VITALS — BP 139/93 | HR 87 | Ht 61.0 in | Wt 163.0 lb

## 2018-07-10 DIAGNOSIS — E669 Obesity, unspecified: Secondary | ICD-10-CM

## 2018-07-10 DIAGNOSIS — I480 Paroxysmal atrial fibrillation: Secondary | ICD-10-CM

## 2018-07-10 DIAGNOSIS — I10 Essential (primary) hypertension: Secondary | ICD-10-CM

## 2018-07-10 DIAGNOSIS — N921 Excessive and frequent menstruation with irregular cycle: Secondary | ICD-10-CM

## 2018-07-10 DIAGNOSIS — D649 Anemia, unspecified: Secondary | ICD-10-CM

## 2018-07-10 DIAGNOSIS — G4730 Sleep apnea, unspecified: Secondary | ICD-10-CM

## 2018-07-10 MED ORDER — SPIRONOLACTONE 25 MG PO TABS: 25.0000 mg | ORAL_TABLET | Freq: Every day | ORAL | 3 refills | Status: DC

## 2018-07-10 NOTE — Progress Notes (Signed)
Electrophysiology TeleHealth Note   Due to national recommendations of social distancing due to COVID 19, an audio/video telehealth visit is felt to be most appropriate for this patient at this time.  See MyChart message from today for the patient's consent to telehealth for [facility].   Date:  07/10/2018   ID:  Emma Stephens, DOB [birth year], MRN [id number]  Location: patient's home  Provider location: [address] [place]  Evaluation Performed: Initial Evaluation  PCP:  Brunetta Jeans, PA-C  Cardiologist:  Virl Axe, MD   Electrophysiologist:  None   Chief Complaint:  Atrial fib  History of Present Illness:    Emma Stephens is a 48 y.o. female who presents via audio/video conferencing for a telehealth visit today for  Newly identified atrial fibrillation      Last seen for PVC 2016, hospitalized 4/20 with Afib which she thought her palpitations similar to her knees.  She is having more problems for a couple of months and then incessant problems for couple days associated with dyspnea intolerance.    Given diltiazem and converted spontaneously  Echocardiogram was ordered but not consummated  Anticoagulation was started in the context of known anemia.  Sleep disordered breathing and daytime somnolence  Hypertension-poorly controlled on 3 medicines  DATE TEST EF   2016 Echo  65%   2016 MYOVIEW   No ischemia         Thromboembolic risk factors (  HTN-1  Gender-1  for a CHADSVASc Score of 2  Date Cr K TSH Hgb  8/17 0.60 3.6  13.3  4/20 0.6 4.1 0.953 11.3     The patient denies symptoms of fevers, chills, cough, or new SOB worrisome for COVID 19.    Past Medical History:  Diagnosis Date  . Cigarette nicotine dependence   . Gout 2011  . Hypertension   . PAF (paroxysmal atrial fibrillation) ([place])   . PVC (premature ventricular contraction)     Past Surgical History:  Procedure Laterality Date  . NECK SURGERY    . TUBAL  LIGATION      Current Outpatient Medications  Medication Sig Dispense Refill  . ALPRAZolam (XANAX) 0.25 MG tablet Take 1 tablet (0.25 mg total) by mouth 2 (two) times daily. 60 tablet 0  . apixaban (ELIQUIS) 5 MG TABS tablet Take 1 tablet (5 mg total) by mouth 2 (two) times daily. 60 tablet 4  . cholecalciferol (VITAMIN D3) 25 MCG (1000 UT) tablet Take 1,000 Units by mouth daily.    . Cyanocobalamin (VITAMIN B-12 PO) Take 1 tablet by mouth daily.    Marland Kitchen diltiazem (CARDIZEM CD) 240 MG 24 hr capsule Take 1 capsule (240 mg total) by mouth daily. 30 capsule 4  . hydrochlorothiazide (HYDRODIURIL) 25 MG tablet Take 1 tablet (25 mg total) by mouth daily. 30 tablet 1  . metoprolol succinate (TOPROL-XL) 25 MG 24 hr tablet Take 1 tablet (25 mg total) by mouth daily. 30 tablet 3  . potassium chloride SA (K-DUR,KLOR-CON) 20 MEQ tablet Take 1 tablet (20 mEq total) by mouth daily. 30 tablet 3   No current facility-administered medications for this visit.     Allergies:   Lisinopril and Wellbutrin [bupropion]   Social History:  The patient  reports that she has been smoking cigarettes. She has a 15.50 pack-year smoking history. She has never used smokeless tobacco. She reports that she does not drink alcohol or use drugs.   Family History:  The patient's  family history includes Cancer (age of onset: 46) in her father; Diabetes in her brother, mother, and sister; Hypertension in her brother, mother, and sister.   ROS:  Please see the history of present illness.   All other systems are personally reviewed and negative.    Exam:    Vital Signs:  BP (!) 139/93 (BP Location: Left Arm)   Pulse 87   Ht 5\' 1"  (1.549 m)   Wt 163 lb (73.9 kg)   LMP 06/13/2018   BMI 30.80 kg/m     Well appearing, alert and conversant, regular work of breathing,  good skin color Eyes- anicteric, neuro- grossly intact, skin- no apparent rash or lesions or cyanosis, mouth- oral mucosa is pink   Labs/Other Tests and Data  Reviewed:    Recent Labs: 06/24/2018: Hemoglobin 11.3; Platelets 485 06/25/2018: BUN 10; Creatinine, Ser 0.66; Potassium 4.1; Sodium 137; TSH 0.953   Wt Readings from Last 3 Encounters:  07/10/18 163 lb (73.9 kg)  06/27/18 163 lb (73.9 kg)  06/24/18 163 lb 9.3 oz (74.2 kg)     Other studies personally reviewed: Additional studies/ records that were reviewed today include: As above       ASSESSMENT & PLAN:    Atrial fibrillation-paroxysmal  PVCs  Hypertension  Obesity  Sleep disordered breathing  Anemia  Metromenorrhagia   The patient has had intermittent palpitations.  In order to facilitate targeted interventions in the future, I have suggested that she get an  Alive Cor monitor so as to be able to distinguish between recurrent A. fib and PVCs.  I will also be doing some reading as I am not sure that, especially in the context of mid menorrhagia, that the risk-benefit for anticoagulation and is 48 year old woman with hypertension is in favor of anticoagulation  Strongly encouraged her to get a sleep study.  We will send this to her primary care  Asked her to follow-up with her gynecologist regarding the menorrhagia and anemia which notably antedates the anticoagulation  Blood pressure is poorly controlled.  We will stop HydroDIURIL and potassium and begin her on spironolactone 25 mg daily.  She will need a metabolic profile in 2 weeks  COVID 19 screen The patient denies symptoms of COVID 19 at this time.  The importance of social distancing was discussed today.  Follow-up:  2 months Next remote:   Current medicines are reviewed at length with the patient today.   The patient has concerns regarding her medicines.  The following changes were made today:  As above   Labs/ tests ordered today include:bmet in 2 weeks  No orders of the defined types were placed in this encounter.   Future tests ( post COVID )  Echo   Patient Risk:  after full review of this  patients clinical status, I feel that they are at moderate risk at this time.  Today, I have spent 25 minutes with the patient with telehealth technology discussing  As above     Signed, Virl Axe, MD  07/10/2018 10:38 AM     [place] [address] [phone] (office) [phone] (fax)

## 2018-07-10 NOTE — Patient Instructions (Addendum)
Pt was called and Dr. Olin Pia recommendations were discussed as followed:  Stop HCTZ and K+ supplement Begin Spironolactone, 25mg  qd  BMP ordered and appt made for May 6  8 week follow up appt made for June 22.  Pt has verbalized understanding of the above and will call with any additional questions.

## 2018-07-22 ENCOUNTER — Encounter: Payer: Self-pay | Admitting: Physician Assistant

## 2018-07-23 ENCOUNTER — Other Ambulatory Visit: Payer: Self-pay | Admitting: Physician Assistant

## 2018-07-24 MED ORDER — ALPRAZOLAM 0.25 MG PO TABS: 0.2500 mg | ORAL_TABLET | Freq: Two times a day (BID) | ORAL | 0 refills | Status: DC

## 2018-07-26 ENCOUNTER — Other Ambulatory Visit: Payer: Self-pay | Admitting: Physician Assistant

## 2018-07-26 ENCOUNTER — Other Ambulatory Visit: Payer: Self-pay

## 2018-07-26 ENCOUNTER — Other Ambulatory Visit: Payer: Self-pay | Admitting: *Deleted

## 2018-07-26 ENCOUNTER — Telehealth: Payer: Self-pay | Admitting: Internal Medicine

## 2018-07-26 DIAGNOSIS — I480 Paroxysmal atrial fibrillation: Secondary | ICD-10-CM

## 2018-07-26 DIAGNOSIS — I10 Essential (primary) hypertension: Secondary | ICD-10-CM

## 2018-07-26 LAB — BASIC METABOLIC PANEL
BUN/Creatinine Ratio: 12 (ref 9–23)
BUN: 8 mg/dL (ref 6–24)
CO2: 21 mmol/L (ref 20–29)
Calcium: 9.3 mg/dL (ref 8.7–10.2)
Chloride: 101 mmol/L (ref 96–106)
Creatinine, Ser: 0.69 mg/dL (ref 0.57–1.00)
GFR calc Af Amer: 118 mL/min/{1.73_m2} (ref >59)
GFR calc non Af Amer: 103 mL/min/{1.73_m2} (ref >59)
Glucose: 86 mg/dL (ref 65–99)
Potassium: 4.3 mmol/L (ref 3.5–5.2)
Sodium: 138 mmol/L (ref 134–144)

## 2018-07-26 NOTE — Telephone Encounter (Signed)
See MyChart encounter.   Dr Caryl Comes stopped Eliquis.

## 2018-07-26 NOTE — Telephone Encounter (Signed)
New Message   Pt c/o medication issue:  1. Name of Medication: apixaban (ELIQUIS) 5 MG TABS tablet    2. How are you currently taking this medication (dosage and times per day)?   3. Are you having a reaction (difficulty breathing--STAT)?   4. What is your medication issue? Patient states that her last appt with Dr. Caryl Comes they discussed him taking her off the Eliquis. She states that before she picks up her rx she wants to know if he wants her to stay on it. She also said that she came in this morning and had her lab work done. Please call

## 2018-08-17 ENCOUNTER — Other Ambulatory Visit: Payer: Self-pay | Admitting: Physician Assistant

## 2018-08-31 ENCOUNTER — Telehealth (HOSPITAL_COMMUNITY): Payer: Self-pay | Admitting: Radiology

## 2018-08-31 NOTE — Telephone Encounter (Signed)
Left message to call office-Patient needs to schedule an echocardiogram.  

## 2018-09-06 ENCOUNTER — Encounter: Payer: Self-pay | Admitting: Physician Assistant

## 2018-09-06 ENCOUNTER — Other Ambulatory Visit: Payer: Self-pay | Admitting: Physician Assistant

## 2018-09-07 MED ORDER — ALPRAZOLAM 0.25 MG PO TABS: 0.2500 mg | ORAL_TABLET | Freq: Two times a day (BID) | ORAL | 0 refills | Status: DC

## 2018-09-11 ENCOUNTER — Telehealth: Payer: Self-pay | Admitting: *Deleted

## 2018-09-11 ENCOUNTER — Telehealth (INDEPENDENT_AMBULATORY_CARE_PROVIDER_SITE_OTHER): Payer: Self-pay | Admitting: Internal Medicine

## 2018-09-11 ENCOUNTER — Other Ambulatory Visit: Payer: Self-pay

## 2018-09-11 VITALS — Ht 61.0 in | Wt 164.0 lb

## 2018-09-11 DIAGNOSIS — I480 Paroxysmal atrial fibrillation: Secondary | ICD-10-CM

## 2018-09-11 DIAGNOSIS — R079 Chest pain, unspecified: Secondary | ICD-10-CM

## 2018-09-11 DIAGNOSIS — I493 Ventricular premature depolarization: Secondary | ICD-10-CM

## 2018-09-11 DIAGNOSIS — G4730 Sleep apnea, unspecified: Secondary | ICD-10-CM

## 2018-09-11 DIAGNOSIS — I10 Essential (primary) hypertension: Secondary | ICD-10-CM

## 2018-09-11 DIAGNOSIS — R0609 Other forms of dyspnea: Secondary | ICD-10-CM

## 2018-09-11 NOTE — Patient Instructions (Signed)
Called and discussed the following recommendations with pt. She has agreed with the testing and verbalized understanding.  Medication Instructions:  Your physician recommends that you continue on your current medications as directed. Please refer to the Current Medication list given to you today.   Labwork: None ordered.   Testing/Procedures: Your physician has requested that you have an echocardiogram. Echocardiography is a painless test that uses sound waves to create images of your heart. It provides your doctor with information about the size and shape of your heart and how well your heart's chambers and valves are working. This procedure takes approximately one hour. There are no restrictions for this procedure.  Non-Cardiac CT scanning, (CAT scanning), is a noninvasive, special x-ray that produces cross-sectional images of the body using x-rays and a computer. CT scans help physicians diagnose and treat medical conditions. For some CT exams, a contrast material is used to enhance visibility in the area of the body being studied. CT scans provide greater clarity and reveal more details than regular x-ray exams.  Your physician has recommended that you have a sleep study. This test records several body functions during sleep, including: brain activity, eye movement, oxygen and carbon dioxide blood levels, heart rate and rhythm, breathing rate and rhythm, the flow of air through your mouth and nose, snoring, body muscle movements, and chest and belly movement.  Your physician has recommended that you wear an event monitor. Event monitors are medical devices that record the heart's electrical activity. Doctors most often [place] these monitors to diagnose arrhythmias. Arrhythmias are problems with the speed or rhythm of the heartbeat. The monitor is a small, portable device. You can wear one while you do your normal daily activities. This is usually used to diagnose what is causing palpitations/syncope  (passing out).    Follow-Up: You have a 2  Month follow up appointment scheduled with Dr Caryl Comes on Aug 24 @ 9:45am  Any Other Special Instructions Will Be Listed Below (If Applicable).     If you need a refill on your cardiac medications before your next appointment, please call your pharmacy.

## 2018-09-11 NOTE — Progress Notes (Signed)
Electrophysiology TeleHealth Note   Due to national recommendations of social distancing due to COVID 19, an audio/video telehealth visit is felt to be most appropriate for this patient at this time.  See MyChart message from today for the patient's consent to telehealth for [facility].   Date:  09/11/2018   ID:  Emma Stephens, DOB 11/03/1970, MRN [id number]  Location: patient's home  Provider location: [address] [place]  Evaluation Performed: Follow-up visit  PCP:  Brunetta Jeans, PA-C  Cardiologist:     Electrophysiologist:  SK   Chief Complaint:  PVC dypsnea  History of Present Illness:    Emma Stephens is a 48 y.o. female who presents via audio/video conferencing for a telehealth visit today.  Since last being seen in our clinic for PAF with poorly controlled hypertension and metromenorrhagia *  the patient reports continuous problems with  SOB and Fatigue and tachypalpitations  Triggered by exertion.  Chest tightness with exertion and relieved w rest.    Anxiety--not really a depression with episodes  BP still a problem.; diet is salt replete.  Sleep disordered breathing  Never diagnosed   2016 Echo  65%   2016 MYOVIEW   No ischemia         Thromboembolic risk factors (  HTN-1  Gender-1  for a CHADSVASc Score of 2  For right now with metamenorrhagia will hold off on anticoagulation  Date Cr K TSH Hgb  8/17 0.60 3.6  13.3  4/20 0.6 4.1 0.953 11.3  5/20 0.69 4.3       The patient denies symptoms of fevers, chills, cough, or new SOB worrisome for COVID 19.    Past Medical History:  Diagnosis Date  . Cigarette nicotine dependence   . Gout 2011  . Hypertension   . PAF (paroxysmal atrial fibrillation) ([place])   . PVC (premature ventricular contraction)     Past Surgical History:  Procedure Laterality Date  . NECK SURGERY    . TUBAL LIGATION      Current Outpatient Medications  Medication Sig Dispense Refill   . ALPRAZolam (XANAX) 0.25 MG tablet Take 1 tablet (0.25 mg total) by mouth 2 (two) times daily. 60 tablet 0  . cholecalciferol (VITAMIN D3) 25 MCG (1000 UT) tablet Take 1,000 Units by mouth daily.    . Cyanocobalamin (VITAMIN B-12 PO) Take 1 tablet by mouth daily.    Marland Kitchen diltiazem (CARDIZEM CD) 240 MG 24 hr capsule Take 1 capsule (240 mg total) by mouth daily. 30 capsule 4  . metoprolol succinate (TOPROL-XL) 25 MG 24 hr tablet TAKE 1 TABLET BY MOUTH EVERY DAY 90 tablet 1  . spironolactone (ALDACTONE) 25 MG tablet Take 1 tablet (25 mg total) by mouth daily. 90 tablet 3   No current facility-administered medications for this visit.     Allergies:   Lisinopril and Wellbutrin [bupropion]   Social History:  The patient  reports that she has been smoking cigarettes. She has a 15.50 pack-year smoking history. She has never used smokeless tobacco. She reports that she does not drink alcohol or use drugs.   Family History:  The patient's   family history includes Cancer (age of onset: 37) in her father; Diabetes in her brother, mother, and sister; Hypertension in her brother, mother, and sister.   ROS:  Please see the history of present illness.   All other systems are personally reviewed and negative.    Exam:    Vital Signs:  Ht 5\' 1"  (1.549 m)   Wt 164 lb (74.4 kg)   BMI 30.99 kg/m     Well appearing, alert and conversant, regular work of breathing,  good skin color Eyes- anicteric, neuro- grossly intact, skin- no apparent rash or lesions or cyanosis, mouth- oral mucosa is pink   Labs/Other Tests and Data Reviewed:    Recent Labs: 06/24/2018: Hemoglobin 11.3; Platelets 485 06/25/2018: TSH 0.953 07/26/2018: BUN 8; Creatinine, Ser 0.69; Potassium 4.3; Sodium 138   Wt Readings from Last 3 Encounters:  09/11/18 164 lb (74.4 kg)  07/10/18 163 lb (73.9 kg)  06/27/18 163 lb (73.9 kg)     Other studies personally reviewed: Additional studies/ records that were reviewed today include:  As above         ASSESSMENT & PLAN:   Palpitations  Atrial fibrillation-paroxysmal  PVCs  Hypertension  Chest pain-exertional  Obesity  Sleep disordered breathing  Anemia  Dyspnea on Exertion   Metromenorrhagia  Multiple symptoms without clear cause.  Her dyspnea is frequently associated with palpitations.  Need to clarify whether this is A. fib, sinus or frequent PVCs.  Will use a 2-week ZIO Patch  With her dyspnea and her high blood pressure, need to assess left ventricular function--we will get an echocardiogram.  Chest pain with exertion may well represent coronary disease; Myoview scan was negative -4 years ago.  Could simply be related to a significant hypertension; We will do a calcium score.  Her hypertension has been better.  We discussed the importance of salt avoidance.  She will work on it.  She also has significant sleep disordered breathing.  We will undertake a sleep study.  Aldactone has been beneficial.  Potassium levels were normal following initiation    COVID 19 screen The patient denies symptoms of COVID 19 at this time.  The importance of social distancing was discussed today.  Follow-up:  2 m    Current medicines are reviewed at length with the patient today.   The patient does not have concerns regarding her medicines.  The following changes were made today:  none  Labs/ tests ordered today include:  Sleep study -home Echo LV function dyspnea Ca Score or CTA ( which ever is easier for her) [facility] XT  2 weeks   No orders of the defined types were placed in this encounter.   Future tests ( post COVID )     Patient Risk:  after full review of this patients clinical status, I feel that they are at moderate  risk at this time.  Today, I have spent 21* minutes with the patient with telehealth technology discussing the above.  More than 50% of 40 min was spent in counseling related to the above   Signed, Virl Axe, MD  09/11/2018  9:38 AM     [facility] [address] [place] [address] [phone] (office) [phone] (fax)

## 2018-09-11 NOTE — Telephone Encounter (Signed)
-----   Message from [name], RN sent at 09/11/2018 11:23 AM EDT ----- Regarding: Home Sleep Hello  This pt has orders for a home sleep study that will need to be scheduled  Thanks!  Lorren

## 2018-09-18 ENCOUNTER — Telehealth (HOSPITAL_COMMUNITY): Payer: Self-pay

## 2018-09-18 ENCOUNTER — Telehealth: Payer: Self-pay | Admitting: *Deleted

## 2018-09-18 NOTE — Telephone Encounter (Signed)
Staff message sent to [place]. Ok to schedule HST. Per chart patient doesn't have any insurance.

## 2018-09-18 NOTE — Telephone Encounter (Signed)
-----   Message from [name], RN sent at 09/11/2018 11:23 AM EDT ----- Regarding: Home Sleep Hello  This pt has orders for a home sleep study that will need to be scheduled  Thanks!  Lorren

## 2018-09-18 NOTE — Telephone Encounter (Signed)

## 2018-09-19 ENCOUNTER — Other Ambulatory Visit: Payer: Self-pay

## 2018-09-19 ENCOUNTER — Ambulatory Visit (HOSPITAL_COMMUNITY): Payer: Self-pay | Attending: Cardiology

## 2018-09-19 ENCOUNTER — Ambulatory Visit (INDEPENDENT_AMBULATORY_CARE_PROVIDER_SITE_OTHER)
Admission: RE | Admit: 2018-09-19 | Discharge: 2018-09-19 | Disposition: A | Discharge status: Home / Self Care | Payer: Self-pay | Source: Ambulatory Visit | Attending: Internal Medicine | Admitting: Internal Medicine

## 2018-09-19 DIAGNOSIS — R079 Chest pain, unspecified: Secondary | ICD-10-CM

## 2018-09-19 DIAGNOSIS — I480 Paroxysmal atrial fibrillation: Secondary | ICD-10-CM

## 2018-09-19 DIAGNOSIS — G4730 Sleep apnea, unspecified: Secondary | ICD-10-CM

## 2018-09-19 DIAGNOSIS — I493 Ventricular premature depolarization: Secondary | ICD-10-CM

## 2018-09-19 DIAGNOSIS — I10 Essential (primary) hypertension: Secondary | ICD-10-CM

## 2018-09-19 DIAGNOSIS — R0609 Other forms of dyspnea: Secondary | ICD-10-CM

## 2018-09-21 ENCOUNTER — Telehealth: Payer: Self-pay | Admitting: *Deleted

## 2018-09-21 NOTE — Telephone Encounter (Signed)
-----   Message from Lauralee Evener, [place] sent at 09/18/2018  1:27 PM EDT ----- Regarding: RE: Home Sleep Per chart patient has no insurance. Ok to schedule. You may want to confirm with patient he has no insurance. ----- Message ----- From: [name], RN Sent: 09/11/2018  11:23 AM EDT To: Freada Bergeron, CMA, [facility] Sleep Studies Subject: Home Sleep                                     Hello  This pt has orders for a home sleep study that will need to be scheduled  Thanks!  Lorren

## 2018-09-21 NOTE — Telephone Encounter (Addendum)
Patient is aware and agreeable to Home Sleep Study through [facility]. Patient is scheduled for 11/10/18 at 12 pm to pick up home sleep kit and meet with Respiratory therapist at [facility]. Patient is aware that if this appointment date and time does not work for them they should contact Artis Delay directly at [phone]. Patient is aware that a sleep packet will be sent from [facility] in week. Patient is agreeable to treatment and thankful for call. Patient is self pay.

## 2018-10-27 ENCOUNTER — Other Ambulatory Visit: Payer: Self-pay

## 2018-10-27 ENCOUNTER — Encounter: Payer: Self-pay | Admitting: Physician Assistant

## 2018-10-27 MED ORDER — ALPRAZOLAM 0.25 MG PO TABS: 0.2500 mg | ORAL_TABLET | Freq: Two times a day (BID) | ORAL | 0 refills | Status: DC

## 2018-10-27 NOTE — Telephone Encounter (Signed)
Last refill: 6.18.20 #60,0 Last OV: 4.17.20

## 2018-11-10 ENCOUNTER — Ambulatory Visit (HOSPITAL_BASED_OUTPATIENT_CLINIC_OR_DEPARTMENT_OTHER): Payer: Self-pay | Attending: Internal Medicine | Admitting: Cardiology

## 2018-11-10 ENCOUNTER — Other Ambulatory Visit: Payer: Self-pay

## 2018-11-10 DIAGNOSIS — G4730 Sleep apnea, unspecified: Secondary | ICD-10-CM

## 2018-11-10 DIAGNOSIS — G4733 Obstructive sleep apnea (adult) (pediatric): Secondary | ICD-10-CM | POA: Insufficient documentation

## 2018-11-10 DIAGNOSIS — R079 Chest pain, unspecified: Secondary | ICD-10-CM | POA: Insufficient documentation

## 2018-11-10 DIAGNOSIS — R0609 Other forms of dyspnea: Secondary | ICD-10-CM | POA: Insufficient documentation

## 2018-11-10 DIAGNOSIS — I480 Paroxysmal atrial fibrillation: Secondary | ICD-10-CM | POA: Insufficient documentation

## 2018-11-10 DIAGNOSIS — I493 Ventricular premature depolarization: Secondary | ICD-10-CM | POA: Insufficient documentation

## 2018-11-10 DIAGNOSIS — I10 Essential (primary) hypertension: Secondary | ICD-10-CM | POA: Insufficient documentation

## 2018-11-13 ENCOUNTER — Ambulatory Visit (INDEPENDENT_AMBULATORY_CARE_PROVIDER_SITE_OTHER): Payer: Self-pay | Admitting: Internal Medicine

## 2018-11-13 ENCOUNTER — Other Ambulatory Visit: Payer: Self-pay

## 2018-11-13 ENCOUNTER — Encounter: Payer: Self-pay | Admitting: Physician Assistant

## 2018-11-13 ENCOUNTER — Encounter: Payer: Self-pay | Admitting: Internal Medicine

## 2018-11-13 VITALS — BP 142/94 | HR 74 | Ht 61.0 in | Wt 168.0 lb

## 2018-11-13 DIAGNOSIS — I480 Paroxysmal atrial fibrillation: Secondary | ICD-10-CM

## 2018-11-13 DIAGNOSIS — I493 Ventricular premature depolarization: Secondary | ICD-10-CM

## 2018-11-13 LAB — BASIC METABOLIC PANEL
BUN/Creatinine Ratio: 11 (ref 9–23)
BUN: 8 mg/dL (ref 6–24)
CO2: 21 mmol/L (ref 20–29)
Calcium: 9.5 mg/dL (ref 8.7–10.2)
Chloride: 101 mmol/L (ref 96–106)
Creatinine, Ser: 0.72 mg/dL (ref 0.57–1.00)
GFR calc Af Amer: 114 mL/min/{1.73_m2} (ref >59)
GFR calc non Af Amer: 99 mL/min/{1.73_m2} (ref >59)
Glucose: 86 mg/dL (ref 65–99)
Potassium: 4.6 mmol/L (ref 3.5–5.2)
Sodium: 136 mmol/L (ref 134–144)

## 2018-11-13 LAB — CBC
Hematocrit: 33.4 % — ABNORMAL LOW (ref 34.0–46.6)
Hemoglobin: 9.9 g/dL — ABNORMAL LOW (ref 11.1–15.9)
MCH: 22.1 pg — ABNORMAL LOW (ref 26.6–33.0)
MCHC: 29.6 g/dL — ABNORMAL LOW (ref 31.5–35.7)
MCV: 75 fL — ABNORMAL LOW (ref 79–97)
Platelets: 548 10*3/uL — ABNORMAL HIGH (ref 150–450)
RBC: 4.48 x10E6/uL (ref 3.77–5.28)
RDW: 16.6 % — ABNORMAL HIGH (ref 11.7–15.4)
WBC: 8.8 10*3/uL (ref 3.4–10.8)

## 2018-11-13 MED ORDER — DILTIAZEM HCL ER COATED BEADS 240 MG PO CP24: 240.0000 mg | ORAL_CAPSULE | Freq: Every day | ORAL | 3 refills | Status: DC

## 2018-11-13 NOTE — Patient Instructions (Addendum)
Medication Instructions:  Your physician recommends that you continue on your current medications as directed. Please refer to the Current Medication list given to you today.  Labwork: You will have labs drawn today: CBC and BMP   Testing/Procedures: None ordered.  Follow-Up: Your physician recommends that you schedule a follow-up appointment in:   6 mo with an APP  12 mo with Dr. Caryl Comes  Any Other Special Instructions Will Be Listed Below (If Applicable).     If you need a refill on your cardiac medications before your next appointment, please call your pharmacy.

## 2018-11-13 NOTE — Procedures (Signed)
    Patient Name: Emma, Stephens Date: 11/11/2018 Gender: Female D.O.B: Oct 15, 1970 Age (years): 48 Referring Provider: Virl Axe Height (inches): 64 Interpreting Physician: Fransico Him MD, ABSM Weight (lbs): 164 RPSGT: Jacolyn Reedy BMI: 28 MRN: [id number] Neck Size: 15.50  CLINICAL INFORMATION Sleep Study Type: HST  Indication for sleep study: Hypertension  Epworth Sleepiness Score: 11  SLEEP STUDY TECHNIQUE A multi-channel overnight portable sleep study was performed. The channels recorded were: nasal airflow, thoracic respiratory movement, and oxygen saturation with a pulse oximetry. Snoring was also monitored.  MEDICATIONS Patient self administered medications include: N/A.  SLEEP ARCHITECTURE Patient was studied for 487.8 minutes. The sleep efficiency was 100.0 % and the patient was supine for 42%. The arousal index was 0.0 per hour.  RESPIRATORY PARAMETERS The overall AHI was 68.6 per hour, with a central apnea index of 0.0 per hour.  The oxygen nadir was 82% during sleep.  CARDIAC DATA Mean heart rate during sleep was 75.9 bpm.  IMPRESSIONS - Severe obstructive sleep apnea occurred during this study (AHI = 68.6/h). - No significant central sleep apnea occurred during this study (CAI = 0.0/h). - Moderate oxygen desaturation was noted during this study (Min O2 = 82%). - Patient snored 10.4% during the sleep.  DIAGNOSIS - Obstructive Sleep Apnea (327.23 [G47.33 ICD-10])  RECOMMENDATIONS - Recommend in lab CPAP titration.  - Positional therapy avoiding supine position during sleep. - Avoid alcohol, sedatives and other CNS depressants that may worsen sleep apnea and disrupt normal sleep architecture. - Sleep hygiene should be reviewed to assess factors that may improve sleep quality. - Weight management and regular exercise should be initiated or continued.  [Electronically signed] 11/13/2018 03:23 PM  Fransico Him MD, ABSM Diplomate,  American Board of Sleep Medicine

## 2018-11-13 NOTE — Progress Notes (Signed)
Patient Care Team: Delorse Limber as PCP - General (Family Medicine) [name], MD as PCP - Cardiology (Cardiology) Newton Pigg, MD as Consulting Physician (Obstetrics and Gynecology)   HPI  Emma Stephens is a 48 y.o. female With afib-paroxysmal, poorly controlled afib, and metromenorrhagia  2016 Echo 65%   2016 MYOVIEW  No ischemia  6/20 Echo  123456     Thromboembolic risk factors ( HTN-1 Gender-1 for a CHADSVASc Score of 2  Much improved BP since the addition of aldactone Uterine bleeding much improved  The patient denies chest pain, shortness of breath, nocturnal dyspnea, orthopnea or peripheral edema.  There have been no palpitations, lightheadedness or syncope.      Date Cr K TSH Hgb  8/17 0.60 3.6  13.3  4/20 0.6 4.1 0.953 11.3  5/20 0.69 4.3     Home sleep study results pending   Records and Results Reviewed   Past Medical History:  Diagnosis Date  . Cigarette nicotine dependence   . Gout 2011  . Hypertension   . PAF (paroxysmal atrial fibrillation) ([place])   . PVC (premature ventricular contraction)     Past Surgical History:  Procedure Laterality Date  . NECK SURGERY    . TUBAL LIGATION      Current Meds  Medication Sig  . ALPRAZolam (XANAX) 0.25 MG tablet Take 1 tablet (0.25 mg total) by mouth 2 (two) times daily. Need follow-up before further refills will be given.  . cholecalciferol (VITAMIN D3) 25 MCG (1000 UT) tablet Take 1,000 Units by mouth daily.  . Cyanocobalamin (VITAMIN B-12 PO) Take 1 tablet by mouth daily.  Marland Kitchen diltiazem (CARDIZEM CD) 240 MG 24 hr capsule Take 1 capsule (240 mg total) by mouth daily.  . metoprolol succinate (TOPROL-XL) 25 MG 24 hr tablet TAKE 1 TABLET BY MOUTH EVERY DAY  . spironolactone (ALDACTONE) 25 MG tablet Take 1 tablet (25 mg total) by mouth daily.    Allergies  Allergen Reactions  . Lisinopril Hives and Swelling    Angioedema and facial swelling  . Wellbutrin  [Bupropion] Itching      Review of Systems negative except from HPI and PMH  Physical Exam BP (!) 142/94   Pulse 74   Ht 5\' 1"  (1.549 m)   Wt 168 lb (76.2 kg)   SpO2 98%   BMI 31.74 kg/m  Well developed and well nourished in no acute distress HENT normal E scleral and icterus clear Neck Supple Clear to ausculation  *Regular rate and rhythm, no murmurs gallops or rub Soft   No clubbing cyanosis  Edema Alert and oriented, grossly normal motor and sensory function Skin Warm and Dry  ECG  Sinus @ 74 17/07/39 O/w normal    Assessment and  Plan Palpitations  Atrial fibrillation-paroxysmal  PVCs  Hypertension  Chest pain-exertional  Obesity  Sleep disordered breathing  Anemia  Metromenorrhagia resolved   BP is much improved on aldactone Needs to check K  Side effects reviewed. Sleep study pending  Metromenorrhagia has abated  Will plan to check CBC and if recurrent palps willconsider anticoagulation   The issue of CHADS VASc of 2 is hard for a woman,  The risks of HTN are largely mitigated by age, ie the predictive value of HTN as risk is much less than that of age  PVCs are quiet  MAny of her symptoms are better with BP control and bleeding improved  We spent more than 50% of our >  25 min visit in face to face counseling regarding the above     Current medicines are reviewed at length with the patient today .  The patient does not  have concerns regarding medicines.

## 2018-11-14 ENCOUNTER — Telehealth: Payer: Self-pay | Admitting: *Deleted

## 2018-11-14 NOTE — Telephone Encounter (Signed)
Informed patient of sleep study results and patient understanding was verbalized. Patient understands her sleep study showed  that they have sleep apnea and recommend CPAP titration. Please set up titration in the sleep lab.   Patient states her insurance starts Oct 1 and she will call our office with information then. Titration sent to sleep lab.

## 2018-11-14 NOTE — Telephone Encounter (Signed)
-----   Message from Sueanne Margarita, MD sent at 11/13/2018  3:25 PM EDT ----- Please let patient know that they have sleep apnea and recommend CPAP titration. Please set up titration in the sleep lab.

## 2018-11-14 NOTE — Telephone Encounter (Signed)
Staff message sent to [place]. Chart shows patient has no insurance. dhe may want to ask Dr Radford Pax if she wants to do APAP sue to cost.

## 2018-11-14 NOTE — Telephone Encounter (Signed)
This encounter was created in error - please disregard.

## 2018-11-14 NOTE — Telephone Encounter (Signed)
Patient states her insurance starts Oct 1 and she will call our office with information then. Informed patient of sleep study results and patient understanding was verbalized. Patient understands her sleep study showed they have sleep apnea and recommend CPAP titration. Please set up titration in the sleep lab.  Pt is aware and agreeable to her results. Titration sent to sleep pool.

## 2018-11-14 NOTE — Addendum Note (Signed)
Addended by: Freada Bergeron on: 11/14/2018 06:33 PM   Modules accepted: Level of Service, SmartSet

## 2018-11-17 ENCOUNTER — Other Ambulatory Visit: Payer: Self-pay

## 2018-11-17 ENCOUNTER — Encounter: Payer: Self-pay | Admitting: Physician Assistant

## 2018-11-17 ENCOUNTER — Ambulatory Visit: Payer: Self-pay | Admitting: Physician Assistant

## 2018-11-17 VITALS — BP 150/84 | HR 80 | Temp 98.6°F | Resp 16 | Ht 61.0 in | Wt 168.0 lb

## 2018-11-17 DIAGNOSIS — D649 Anemia, unspecified: Secondary | ICD-10-CM

## 2018-11-17 DIAGNOSIS — K219 Gastro-esophageal reflux disease without esophagitis: Secondary | ICD-10-CM

## 2018-11-17 LAB — CBC WITH DIFFERENTIAL/PLATELET
Basophils Absolute: 0 10*3/uL (ref 0.0–0.1)
Basophils Relative: 0.3 % (ref 0.0–3.0)
Eosinophils Absolute: 0.2 10*3/uL (ref 0.0–0.7)
Eosinophils Relative: 2.7 % (ref 0.0–5.0)
HCT: 31.4 % — ABNORMAL LOW (ref 36.0–46.0)
Hemoglobin: 9.7 g/dL — ABNORMAL LOW (ref 12.0–15.0)
Lymphocytes Relative: 41 % (ref 12.0–46.0)
Lymphs Abs: 3.1 10*3/uL (ref 0.7–4.0)
MCHC: 30.8 g/dL (ref 30.0–36.0)
MCV: 71.5 fl — ABNORMAL LOW (ref 78.0–100.0)
Monocytes Absolute: 0.6 10*3/uL (ref 0.1–1.0)
Monocytes Relative: 7.3 % (ref 3.0–12.0)
Neutro Abs: 3.7 10*3/uL (ref 1.4–7.7)
Neutrophils Relative %: 48.7 % (ref 43.0–77.0)
Platelets: 467 10*3/uL — ABNORMAL HIGH (ref 150.0–400.0)
RBC: 4.4 Mil/uL (ref 3.87–5.11)
RDW: 18.3 % — ABNORMAL HIGH (ref 11.5–15.5)
WBC: 7.6 10*3/uL (ref 4.0–10.5)

## 2018-11-17 MED ORDER — PANTOPRAZOLE SODIUM 40 MG PO TBEC: 40.0000 mg | DELAYED_RELEASE_TABLET | Freq: Every day | ORAL | 3 refills | Status: DC

## 2018-11-17 MED ORDER — ALPRAZOLAM 0.25 MG PO TABS: 0.2500 mg | ORAL_TABLET | Freq: Two times a day (BID) | ORAL | 0 refills | Status: DC

## 2018-11-17 NOTE — Telephone Encounter (Signed)
Last refill: 8.7.20 #30,0 Last OV: 8.28.20   Patient is requesting that quantity 60 be sent in due to script directions saying up to twice daily.   Please advise

## 2018-11-17 NOTE — Progress Notes (Signed)
Patient presents to clinic today for assessment of abnormal lab values noted by Cardiology at recent visit. Patient was noted to be significantly anemic with Hgb at 9. 13 at last check a few months ago. Patient endorses remote history of anemia but cannot remember which type. Denies hematuria, melena, hematochezia. Denies abdominal pain. Does note frequent heartburn for which she takes TUMS daily. Denies change to bowel habits. Denies heavy periods but has noted increased duration of last two menstrual periods by a day or so. Denies chest pain, SOB, LH or dizziness.  Past Medical History:  Diagnosis Date  . Cigarette nicotine dependence   . Gout 2011  . Hypertension   . PAF (paroxysmal atrial fibrillation) ([place])   . PVC (premature ventricular contraction)     Current Outpatient Medications on File Prior to Visit  Medication Sig Dispense Refill  . ALPRAZolam (XANAX) 0.25 MG tablet Take 1 tablet (0.25 mg total) by mouth 2 (two) times daily. Need follow-up before further refills will be given. 30 tablet 0  . cholecalciferol (VITAMIN D3) 25 MCG (1000 UT) tablet Take 1,000 Units by mouth daily.    . Cyanocobalamin (VITAMIN B-12 PO) Take 2 tablets by mouth daily.     Marland Kitchen diltiazem (CARDIZEM CD) 240 MG 24 hr capsule Take 1 capsule (240 mg total) by mouth daily. 90 capsule 3  . metoprolol succinate (TOPROL-XL) 25 MG 24 hr tablet TAKE 1 TABLET BY MOUTH EVERY DAY 90 tablet 1  . spironolactone (ALDACTONE) 25 MG tablet Take 1 tablet (25 mg total) by mouth daily. 90 tablet 3   No current facility-administered medications on file prior to visit.     Allergies  Allergen Reactions  . Lisinopril Hives and Swelling    Angioedema and facial swelling  . Wellbutrin [Bupropion] Itching    Family History  Problem Relation Age of Onset  . Diabetes Mother   . Hypertension Mother   . Cancer Father 69       oral  . Diabetes Sister   . Hypertension Sister   . Diabetes Brother   . Hypertension Brother    . Sudden Cardiac Death Neg Hx   . Heart attack Neg Hx     Social History   Socioeconomic History  . Marital status: Married    Spouse name: Not on file  . Number of children: Not on file  . Years of education: Not on file  . Highest education level: Not on file  Occupational History  . Occupation: Stage manager: [employer]  . Financial resource strain: Not on file  . Food insecurity    Worry: Not on file    Inability: Not on file  . Transportation needs    Medical: Not on file    Non-medical: Not on file  Tobacco Use  . Smoking status: Current Every Day Smoker    Packs/day: 0.50    Years: 31.00    Pack years: 15.50    Types: Cigarettes  . Smokeless tobacco: Never Used  Substance and Sexual Activity  . Alcohol use: No    Alcohol/week: 0.0 standard drinks  . Drug use: No  . Sexual activity: Yes    Partners: Male  Lifestyle  . Physical activity    Days per week: Not on file    Minutes per session: Not on file  . Stress: Not on file  Relationships  . Social connections    Talks on phone: Not on file  Gets together: Not on file    Attends religious service: Not on file    Active member of club or organization: Not on file    Attends meetings of clubs or organizations: Not on file    Relationship status: Not on file  Other Topics Concern  . Not on file  Social History Narrative   Lives in [place] with family.    Review of Systems - See HPI.  All other ROS are negative.  BP (!) 150/84   Pulse 80   Temp 98.6 F (37 C) (Skin)   Resp 16   Ht 5\' 1"  (1.549 m)   Wt 168 lb (76.2 kg)   LMP 10/27/2018 (Exact Date) Comment: Usually last 3-4 days  SpO2 98%   BMI 31.74 kg/m   Physical Exam Vitals signs reviewed.  Constitutional:      Appearance: Normal appearance.  HENT:     Head: Normocephalic and atraumatic.     Right Ear: Tympanic membrane normal.     Left Ear: Tympanic membrane normal.     Nose: Nose normal.     Mouth/Throat:      Mouth: Mucous membranes are moist.  Eyes:     Conjunctiva/sclera: Conjunctivae normal.     Pupils: Pupils are equal, round, and reactive to light.  Neck:     Musculoskeletal: Neck supple.  Cardiovascular:     Rate and Rhythm: Normal rate and regular rhythm.     Pulses: Normal pulses.     Heart sounds: Normal heart sounds.  Pulmonary:     Effort: Pulmonary effort is normal.     Breath sounds: Normal breath sounds.  Abdominal:     General: Bowel sounds are normal. There is no distension.     Palpations: Abdomen is soft.     Tenderness: There is no abdominal tenderness.  Neurological:     General: No focal deficit present.     Mental Status: She is alert and oriented to person, place, and time.  Psychiatric:        Mood and Affect: Mood normal.     Recent Results (from the past 2160 hour(s))  Basic metabolic panel     Status: None   Collection Time: 11/13/18 11:04 AM  Result Value Ref Range   Glucose 86 65 - 99 mg/dL   BUN 8 6 - 24 mg/dL   Creatinine, Ser 0.72 0.57 - 1.00 mg/dL   GFR calc non Af Amer 99 >59 mL/min/1.73   GFR calc Af Amer 114 >59 mL/min/1.73   BUN/Creatinine Ratio 11 9 - 23   Sodium 136 134 - 144 mmol/L   Potassium 4.6 3.5 - 5.2 mmol/L   Chloride 101 96 - 106 mmol/L   CO2 21 20 - 29 mmol/L   Calcium 9.5 8.7 - 10.2 mg/dL  CBC     Status: Abnormal   Collection Time: 11/13/18 11:04 AM  Result Value Ref Range   WBC 8.8 3.4 - 10.8 x10E3/uL   RBC 4.48 3.77 - 5.28 x10E6/uL   Hemoglobin 9.9 (L) 11.1 - 15.9 g/dL   Hematocrit 33.4 (L) 34.0 - 46.6 %   MCV 75 (L) 79 - 97 fL   MCH 22.1 (L) 26.6 - 33.0 pg   MCHC 29.6 (L) 31.5 - 35.7 g/dL   RDW 16.6 (H) 11.7 - 15.4 %   Platelets 548 (H) 150 - 450 x10E3/uL    Assessment/Plan: 1. Anemia, unspecified type No overt signs of bleeding. Some increase duration of menstrual periods. Will  repeat CBC today and obtain full iron panel. Hemmocult IFT x 3 sent home with patient to obtain and send in so we can assess for occult  blood. If + will need GI referral. Mild GERD symptoms. Will start PPI daily. Supportive measures reviewed. Supplements and hydration discussed. Will start Rx for iron based on findings.  - CBC w/Diff - Iron, TIBC and Ferritin Panel  2. Gastroesophageal reflux disease without esophagitis GERD diet reviewed. Stop TUMS. Start trial of Protonix. Follow-up discussed.    Leeanne Rio, PA-C

## 2018-11-17 NOTE — Patient Instructions (Signed)
Please go to the lab today for blood work.  I will call you with your results. We will alter treatment regimen(s) if indicated by your results.   Increase fluids. Continue current medications as directed for now.  Send in the hemoccult cards as soon as completed so we can assess. Start the Pantoprazole once daily for the next 2 weeks. Follow dietary recommendations below.   Go ahead and schedule a 62-month follow-up for BP with the ladies up front. We will recheck potassium levels at that time.    Food Choices for Gastroesophageal Reflux Disease, Adult When you have gastroesophageal reflux disease (GERD), the foods you eat and your eating habits are very important. Choosing the right foods can help ease your discomfort. Think about working with a nutrition specialist (dietitian) to help you make good choices. What are tips for following this plan?  Meals  Choose healthy foods that are low in fat, such as fruits, vegetables, whole grains, low-fat dairy products, and lean meat, fish, and poultry.  Eat small meals often instead of 3 large meals a day. Eat your meals slowly, and in a place where you are relaxed. Avoid bending over or lying down until 2-3 hours after eating.  Avoid eating meals 2-3 hours before bed.  Avoid drinking a lot of liquid with meals.  Cook foods using methods other than frying. Bake, grill, or broil food instead.  Avoid or limit: ? Chocolate. ? Peppermint or spearmint. ? Alcohol. ? Pepper. ? Black and decaffeinated coffee. ? Black and decaffeinated tea. ? Bubbly (carbonated) soft drinks. ? Caffeinated energy drinks and soft drinks.  Limit high-fat foods such as: ? Fatty meat or fried foods. ? Whole milk, cream, butter, or ice cream. ? Nuts and nut butters. ? Pastries, donuts, and sweets made with butter or shortening.  Avoid foods that cause symptoms. These foods may be different for everyone. Common foods that cause symptoms include: ? Tomatoes. ?  Oranges, lemons, and limes. ? Peppers. ? Spicy food. ? Onions and garlic. ? Vinegar. Lifestyle  Maintain a healthy weight. Ask your doctor what weight is healthy for you. If you need to lose weight, work with your doctor to do so safely.  Exercise for at least 30 minutes for 5 or more days each week, or as told by your doctor.  Wear loose-fitting clothes.  Do not smoke. If you need help quitting, ask your doctor.  Sleep with the head of your bed higher than your feet. Use a wedge under the mattress or blocks under the bed frame to raise the head of the bed. Summary  When you have gastroesophageal reflux disease (GERD), food and lifestyle choices are very important in easing your symptoms.  Eat small meals often instead of 3 large meals a day. Eat your meals slowly, and in a place where you are relaxed.  Limit high-fat foods such as fatty meat or fried foods.  Avoid bending over or lying down until 2-3 hours after eating.  Avoid peppermint and spearmint, caffeine, alcohol, and chocolate. This information is not intended to replace advice given to you by your health care provider. Make sure you discuss any questions you have with your health care provider. Document Released: 09/07/2011 Document Revised: 06/29/2018 Document Reviewed: 04/13/2016 Elsevier Patient Education  2020 [employer].

## 2018-11-18 LAB — IRON,TIBC AND FERRITIN PANEL
%SAT: 4 % (calc) — ABNORMAL LOW (ref 16–45)
Ferritin: 5 ng/mL — ABNORMAL LOW (ref 16–232)
Iron: 16 ug/dL — ABNORMAL LOW (ref 40–190)
TIBC: 438 mcg/dL (calc) (ref 250–450)

## 2018-11-19 ENCOUNTER — Encounter: Payer: Self-pay | Admitting: Physician Assistant

## 2018-11-21 ENCOUNTER — Other Ambulatory Visit: Payer: Self-pay

## 2018-11-21 DIAGNOSIS — E611 Iron deficiency: Secondary | ICD-10-CM

## 2018-11-21 DIAGNOSIS — D649 Anemia, unspecified: Secondary | ICD-10-CM

## 2018-11-21 MED ORDER — IRON (FERROUS SULFATE) 325 (65 FE) MG PO TABS: 325.0000 mg | ORAL_TABLET | Freq: Two times a day (BID) | ORAL | 1 refills | Status: DC

## 2018-12-06 ENCOUNTER — Other Ambulatory Visit: Payer: Self-pay

## 2018-12-06 ENCOUNTER — Other Ambulatory Visit (INDEPENDENT_AMBULATORY_CARE_PROVIDER_SITE_OTHER): Payer: Self-pay

## 2018-12-06 DIAGNOSIS — E611 Iron deficiency: Secondary | ICD-10-CM

## 2018-12-07 LAB — FECAL OCCULT BLOOD, IMMUNOCHEMICAL: Fecal Occult Bld: NEGATIVE

## 2018-12-11 ENCOUNTER — Ambulatory Visit (INDEPENDENT_AMBULATORY_CARE_PROVIDER_SITE_OTHER): Payer: Self-pay

## 2018-12-11 ENCOUNTER — Other Ambulatory Visit: Payer: Self-pay

## 2018-12-11 DIAGNOSIS — D649 Anemia, unspecified: Secondary | ICD-10-CM

## 2018-12-11 DIAGNOSIS — E611 Iron deficiency: Secondary | ICD-10-CM

## 2018-12-11 LAB — CBC WITH DIFFERENTIAL/PLATELET
Basophils Absolute: 0.1 10*3/uL (ref 0.0–0.1)
Basophils Relative: 1.4 % (ref 0.0–3.0)
Eosinophils Absolute: 0.2 10*3/uL (ref 0.0–0.7)
Eosinophils Relative: 2.9 % (ref 0.0–5.0)
HCT: 34.2 % — ABNORMAL LOW (ref 36.0–46.0)
Hemoglobin: 10.9 g/dL — ABNORMAL LOW (ref 12.0–15.0)
Lymphocytes Relative: 35.3 % (ref 12.0–46.0)
Lymphs Abs: 2.7 10*3/uL (ref 0.7–4.0)
MCHC: 31.9 g/dL (ref 30.0–36.0)
MCV: 75.7 fl — ABNORMAL LOW (ref 78.0–100.0)
Monocytes Absolute: 0.6 10*3/uL (ref 0.1–1.0)
Monocytes Relative: 7.6 % (ref 3.0–12.0)
Neutro Abs: 4.1 10*3/uL (ref 1.4–7.7)
Neutrophils Relative %: 52.8 % (ref 43.0–77.0)
Platelets: 391 10*3/uL (ref 150.0–400.0)
RBC: 4.52 Mil/uL (ref 3.87–5.11)
RDW: 24.5 % — ABNORMAL HIGH (ref 11.5–15.5)
WBC: 7.7 10*3/uL (ref 4.0–10.5)

## 2018-12-12 ENCOUNTER — Encounter: Payer: Self-pay | Admitting: Physician Assistant

## 2018-12-12 ENCOUNTER — Other Ambulatory Visit: Payer: Self-pay | Admitting: Physician Assistant

## 2018-12-12 LAB — IRON,TIBC AND FERRITIN PANEL
%SAT: 59 % (calc) — ABNORMAL HIGH (ref 16–45)
Ferritin: 17 ng/mL (ref 16–232)
Iron: 217 ug/dL — ABNORMAL HIGH (ref 40–190)
TIBC: 365 mcg/dL (calc) (ref 250–450)

## 2018-12-13 ENCOUNTER — Telehealth: Payer: Self-pay

## 2018-12-13 MED ORDER — ALPRAZOLAM 0.25 MG PO TABS: 0.2500 mg | ORAL_TABLET | Freq: Two times a day (BID) | ORAL | 1 refills | Status: DC

## 2018-12-13 NOTE — Telephone Encounter (Signed)
Refill sent.

## 2018-12-13 NOTE — Telephone Encounter (Signed)
Last refill: 8.28.20 #30, 0 Last OV: 8.28.20 dx. Anemia

## 2018-12-14 ENCOUNTER — Other Ambulatory Visit: Payer: Self-pay | Admitting: Physician Assistant

## 2018-12-15 ENCOUNTER — Telehealth: Payer: Self-pay

## 2018-12-15 ENCOUNTER — Encounter: Payer: Self-pay | Admitting: Physician Assistant

## 2018-12-15 NOTE — Telephone Encounter (Signed)
LM for pt to call to go over monitor instructions.

## 2019-01-02 ENCOUNTER — Ambulatory Visit (INDEPENDENT_AMBULATORY_CARE_PROVIDER_SITE_OTHER): Payer: Self-pay

## 2019-01-02 ENCOUNTER — Other Ambulatory Visit: Payer: Self-pay

## 2019-01-02 DIAGNOSIS — E611 Iron deficiency: Secondary | ICD-10-CM

## 2019-01-02 DIAGNOSIS — D649 Anemia, unspecified: Secondary | ICD-10-CM

## 2019-01-02 LAB — IRON: Iron: 129 ug/dL (ref 42–145)

## 2019-01-02 LAB — CBC WITH DIFFERENTIAL/PLATELET
Basophils Absolute: 0.1 10*3/uL (ref 0.0–0.1)
Basophils Relative: 0.7 % (ref 0.0–3.0)
Eosinophils Absolute: 0.2 10*3/uL (ref 0.0–0.7)
Eosinophils Relative: 2.6 % (ref 0.0–5.0)
HCT: 36.3 % (ref 36.0–46.0)
Hemoglobin: 11.7 g/dL — ABNORMAL LOW (ref 12.0–15.0)
Lymphocytes Relative: 50.5 % — ABNORMAL HIGH (ref 12.0–46.0)
Lymphs Abs: 4 10*3/uL (ref 0.7–4.0)
MCHC: 32.3 g/dL (ref 30.0–36.0)
MCV: 78.3 fl (ref 78.0–100.0)
Monocytes Absolute: 0.5 10*3/uL (ref 0.1–1.0)
Monocytes Relative: 6.3 % (ref 3.0–12.0)
Neutro Abs: 3.1 10*3/uL (ref 1.4–7.7)
Neutrophils Relative %: 39.9 % — ABNORMAL LOW (ref 43.0–77.0)
Platelets: 375 10*3/uL (ref 150.0–400.0)
RBC: 4.64 Mil/uL (ref 3.87–5.11)
RDW: 26.4 % — ABNORMAL HIGH (ref 11.5–15.5)
WBC: 7.9 10*3/uL (ref 4.0–10.5)

## 2019-01-04 ENCOUNTER — Other Ambulatory Visit: Payer: Self-pay | Admitting: Emergency Medicine

## 2019-01-04 DIAGNOSIS — E611 Iron deficiency: Secondary | ICD-10-CM

## 2019-01-04 DIAGNOSIS — D649 Anemia, unspecified: Secondary | ICD-10-CM

## 2019-01-09 ENCOUNTER — Telehealth: Payer: Self-pay | Admitting: *Deleted

## 2019-01-09 NOTE — Telephone Encounter (Signed)
Please call [phone] to set up ZIO XT cardiac monitor for Dr. Caryl Comes.

## 2019-01-10 ENCOUNTER — Other Ambulatory Visit: Payer: Self-pay

## 2019-01-10 ENCOUNTER — Encounter: Payer: Self-pay | Admitting: Physician Assistant

## 2019-01-10 MED ORDER — ALPRAZOLAM 0.25 MG PO TABS: 0.2500 mg | ORAL_TABLET | Freq: Two times a day (BID) | ORAL | 1 refills | Status: DC

## 2019-01-10 NOTE — Telephone Encounter (Signed)
Last refill: 9.23.20 #30, 1 Last OV: 8.28.20 dx. Anemia

## 2019-01-17 NOTE — Telephone Encounter (Signed)
Pt returned my call. Went over monitor instructions. Verified address. 14 day ZIO was ordered.

## 2019-01-31 ENCOUNTER — Telehealth: Payer: Self-pay | Admitting: Internal Medicine

## 2019-01-31 NOTE — Telephone Encounter (Signed)
Reference # [id number] [employer] called to verify 2nd enrollment was placed as a home enrollment for self pay per their request.

## 2019-01-31 NOTE — Telephone Encounter (Signed)
°  [employer] called and wanted to make our office aware that they were unable to reach the patient to verify insurance coverage, so the order for the patient's Xio monitor was closed. [employer] was later able to reach out and the patient agreed to be self-pay for the monitor. [employer] wanted [place] to send in another order for a monitor so that they can issue it to the patient.  Anyone on the team should be able to help when calling, just use the Reference # [id number]

## 2019-02-05 ENCOUNTER — Encounter: Payer: Self-pay | Admitting: Physician Assistant

## 2019-02-08 ENCOUNTER — Ambulatory Visit (INDEPENDENT_AMBULATORY_CARE_PROVIDER_SITE_OTHER): Payer: Self-pay

## 2019-02-08 DIAGNOSIS — G4730 Sleep apnea, unspecified: Secondary | ICD-10-CM

## 2019-02-08 DIAGNOSIS — I10 Essential (primary) hypertension: Secondary | ICD-10-CM

## 2019-02-08 DIAGNOSIS — R0600 Dyspnea, unspecified: Secondary | ICD-10-CM

## 2019-02-08 DIAGNOSIS — R079 Chest pain, unspecified: Secondary | ICD-10-CM

## 2019-02-08 DIAGNOSIS — I480 Paroxysmal atrial fibrillation: Secondary | ICD-10-CM

## 2019-02-08 DIAGNOSIS — I493 Ventricular premature depolarization: Secondary | ICD-10-CM

## 2019-02-08 DIAGNOSIS — R0609 Other forms of dyspnea: Secondary | ICD-10-CM

## 2019-03-04 ENCOUNTER — Other Ambulatory Visit: Payer: Self-pay | Admitting: Physician Assistant

## 2019-03-06 ENCOUNTER — Encounter: Payer: Self-pay | Admitting: Physician Assistant

## 2019-03-06 MED ORDER — ALPRAZOLAM 0.25 MG PO TABS: 0.2500 mg | ORAL_TABLET | Freq: Two times a day (BID) | ORAL | 1 refills | Status: DC

## 2019-03-08 ENCOUNTER — Other Ambulatory Visit: Payer: Self-pay | Admitting: Physician Assistant

## 2019-03-26 ENCOUNTER — Telehealth: Payer: Self-pay

## 2019-03-26 NOTE — Telephone Encounter (Addendum)
Spoke with pt and advised of monitor results per Dr Caryl Comes.  Pt advised she did have a short run of SVT (17 beats); however,  pt was unaware of event as it was not noted as a trigger on the monitor..  Pt advised otherwise normal.  Continue follow up as scheduled with [place].  Pt verbalizes understanding and agrees with plan.

## 2019-04-05 ENCOUNTER — Encounter: Payer: Self-pay | Admitting: Physician Assistant

## 2019-04-05 NOTE — Telephone Encounter (Signed)
LOV: 11/17/18 Xanax last rx 03/06/19 #30 1 RF

## 2019-04-10 ENCOUNTER — Other Ambulatory Visit: Payer: Self-pay | Admitting: Physician Assistant

## 2019-04-10 ENCOUNTER — Encounter: Payer: Self-pay | Admitting: Physician Assistant

## 2019-05-13 NOTE — Progress Notes (Addendum)
Cardiology Office Note Date:  05/15/2019  Patient ID:  Emma, Stephens Dec 24, 1970, MRN [id number] PCP:  Brunetta Jeans, PA-C  Cardiologist:  Dr. Caryl Comes    Chief Complaint: 6 mo follow up  History of Present Illness: Emma Stephens is a 49 y.o. female with history of HTN, PVCs,  AFib.  She comes in today to be seen for Dr. Caryl Comes.  He saw her in June 2020, she had numerous c/o Palpitations with SOB, exertional CP Planned for ZIO patich and calcium scoring (with h/o neg stress 4 years prior), echo, and sleep study He last saw her Aug 2020, shew was doing well.  Mentioned her BP better controlled and her uterine bleeding improved as well. Discussed her CHA2DS2Vasc score of 2 (including gender).  If recurrent palpitations would consider a/c.   She was found with severe sleep apnea planned for titration and CPAP tx Monitor noted Duration:  12d 8h Findings HR  avg 81  Min 60-Max 127  SVT Nonsustained  1 episodes; fastest 100 bpm for 17 beats;   4 triggered events, one assoc with PVC-isolated; the others assoc with sinus PVCs < 1% PACs < 1%  Echo noted preserved LVEF, no significant VHD Ca++ score was zero  She is doing well.  She only feels palpitations when she misses her medicines, has now set an alarm for this and with good medicine compliance she has not had any.  No dizzy spells, near syncope or syncope.  No SOB She only feels some chest tightness when at peak exercise when she is very SOB, with slowing this settles quickly as does her breathing.  She was waiting on CPAP titration when her insurance had resumed , which it as, but had not yet set this up.  She sees her PMD soon, planned for annual labs  AFib Hx Diagnosed April 2020  > had spontaneous conversion to SR No AAD hx to date   Past Medical History:  Diagnosis Date  . Cigarette nicotine dependence   . Gout 2011  . Hypertension   . PAF (paroxysmal atrial fibrillation) ([place])   . PVC (premature  ventricular contraction)     Past Surgical History:  Procedure Laterality Date  . NECK SURGERY    . TUBAL LIGATION      Current Outpatient Medications  Medication Sig Dispense Refill  . ALPRAZolam (XANAX) 0.25 MG tablet Take 1 tablet (0.25 mg total) by mouth 2 (two) times daily. 30 tablet 1  . cholecalciferol (VITAMIN D3) 25 MCG (1000 UT) tablet Take 1,000 Units by mouth daily.    . Cyanocobalamin (VITAMIN B-12 PO) Take 2 tablets by mouth daily.     Marland Kitchen diltiazem (CARDIZEM CD) 240 MG 24 hr capsule Take 1 capsule (240 mg total) by mouth daily. 90 capsule 3  . ferrous sulfate 325 (65 FE) MG tablet Take 1 tablet (325 mg total) by mouth 2 (two) times daily with a meal. 180 tablet 1  . metoprolol succinate (TOPROL-XL) 25 MG 24 hr tablet TAKE 1 TABLET BY MOUTH EVERY DAY 90 tablet 1  . pantoprazole (PROTONIX) 40 MG tablet TAKE 1 TABLET BY MOUTH EVERY DAY 90 tablet 1  . spironolactone (ALDACTONE) 25 MG tablet Take 1 tablet (25 mg total) by mouth daily. 90 tablet 3   No current facility-administered medications for this visit.    Allergies:   Lisinopril and Wellbutrin [bupropion]   Social History:  The patient  reports that she has been smoking cigarettes. She has a 15.50  pack-year smoking history. She has never used smokeless tobacco. She reports that she does not drink alcohol or use drugs.   Family History:  The patient's family history includes Cancer (age of onset: 4) in her father; Diabetes in her brother, mother, and sister; Hypertension in her brother, mother, and sister.  ROS:  Please see the history of present illness.  All other systems are reviewed and otherwise negative.   PHYSICAL EXAM:  VS:  BP 140/82   Pulse 81   Ht 5' 1.75" (1.568 m)   Wt 168 lb (76.2 kg)   SpO2 94%   BMI 30.98 kg/m  BMI: Body mass index is 30.98 kg/m. Well nourished, well developed, in no acute distress  HEENT: normocephalic, atraumatic  Neck: no JVD, carotid bruits or masses Cardiac:  RRR; no  significant murmurs, no rubs, or gallops Lungs:  CTA b/l, no wheezing, rhonchi or rales  Abd: soft, nontender, obese MS: no deformity or atrophy Ext: no edema  Skin: warm and dry, no rash Neuro:  No gross deficits appreciated Psych: euthymic mood, full affect   EKG:  Not done today  Dec 2020 heart monitor Duration:  12d 8h Findings HR  avg 81  Min 60-Max 127  SVT Nonsustained  1 episodes; fastest 100 bpm for 17 beats;   4 triggered events, one assoc with PVC-isolated; the others assoc with sinus PVCs < 1% PACs < 1%   09/19/2018: Coronary Ca score IMPRESSION: Coronary calcium score of 0.   09/19/2018: TTE IMPRESSIONS  1. The left ventricle has hyperdynamic systolic function, with an  ejection fraction of >65%. The cavity size was normal. Left ventricular  diastolic Doppler parameters are consistent with impaired relaxation. No  evidence of left ventricular regional wall  motion abnormalities.  2. The right ventricle has normal systolic function. The cavity was  normal. There is no increase in right ventricular wall thickness.  3. The aortic root is normal in size and structure.   Recent Labs: 06/25/2018: TSH 0.953 11/13/2018: BUN 8; Creatinine, Ser 0.72; Potassium 4.6; Sodium 136 01/02/2019: Hemoglobin 11.7; Platelets 375.0  No results found for requested labs within last 8760 hours.   CrCl cannot be calculated (Patient's most recent lab result is older than the maximum 21 days allowed.).   Wt Readings from Last 3 Encounters:  05/15/19 168 lb (76.2 kg)  11/17/18 168 lb (76.2 kg)  11/13/18 168 lb (76.2 kg)     Other studies reviewed: Additional studies/records reviewed today include: summarized above  ASSESSMENT AND PLAN:  1. PVCs 2. Paroxysmal AFib     CHA2DS2Vasc is 2 for HTN, gender     Not on a/c     No/low burden by symptoms   2. HTN     Could be better  Discussed exercise and weight loos Discussed good cardiovascular exercise would not require  her to get to that maximum level of exertion that she describes, discussed safe exercise, walking regime    3. OSA     Her insurance is back in force          Will get her back on track for this     This should help her arrhythmia burden as well as BP        Disposition: F/u 6 months, hopefully she is using CPAP regularly by then and perhaps we can reduce some meds for her.  Current medicines are reviewed at length with the patient today.  The patient did not have any concerns regarding  medicines.  Venetia Night, PA-C 05/15/2019 10:58 AM     [facility] [place] [address] [phone] (office)  [phone] (fax)

## 2019-05-15 ENCOUNTER — Ambulatory Visit (INDEPENDENT_AMBULATORY_CARE_PROVIDER_SITE_OTHER): Payer: BC Managed Care – PPO | Admitting: Physician Assistant

## 2019-05-15 ENCOUNTER — Other Ambulatory Visit: Payer: Self-pay

## 2019-05-15 VITALS — BP 140/82 | HR 81 | Ht 61.75 in | Wt 168.0 lb

## 2019-05-15 DIAGNOSIS — I10 Essential (primary) hypertension: Secondary | ICD-10-CM

## 2019-05-15 DIAGNOSIS — I480 Paroxysmal atrial fibrillation: Secondary | ICD-10-CM | POA: Diagnosis not present

## 2019-05-15 DIAGNOSIS — I493 Ventricular premature depolarization: Secondary | ICD-10-CM

## 2019-05-15 NOTE — Patient Instructions (Signed)
Medication Instructions:  Your physician recommends that you continue on your current medications as directed. Please refer to the Current Medication list given to you today.  *If you need a refill on your cardiac medications before your next appointment, please call your pharmacy*  Lab Work: NONE ORDERED  TODAY   If you have labs (blood work) drawn today and your tests are completely normal, you will receive your results only by: . MyChart Message (if you have MyChart) OR . A paper copy in the mail If you have any lab test that is abnormal or we need to change your treatment, we will call you to review the results.  Testing/Procedures: NONE ORDERED  TODAY  Follow-Up: At [facility], you and your health needs are our priority.  As part of our continuing mission to provide you with exceptional heart care, we have created designated Provider Care Teams.  These Care Teams include your primary Cardiologist (physician) and Advanced Practice Providers (APPs -  Physician Assistants and Nurse Practitioners) who all work together to provide you with the care you need, when you need it.  Your next appointment:   6 month(s)  The format for your next appointment:   In Person  Provider:   You may see Dr. Klein  or one of the following Advanced Practice Providers on your designated Care Team:    Amber Seiler, NP  Renee Ursuy, PA-C  Michael "Andy" Tillery, PA-C   Other Instructions    

## 2019-05-17 ENCOUNTER — Telehealth: Payer: Self-pay | Admitting: *Deleted

## 2019-05-17 NOTE — Telephone Encounter (Signed)
-----   Message from Claude Manges, [place] sent at 05/15/2019  2:50 PM EST ----- Regarding: INQUIRING ABOUT SLEEP STUDY TITRATION SET UP IF HAS BEEN DONE

## 2019-05-18 ENCOUNTER — Encounter: Payer: Self-pay | Admitting: Physician Assistant

## 2019-05-18 ENCOUNTER — Other Ambulatory Visit: Payer: Self-pay

## 2019-05-18 ENCOUNTER — Ambulatory Visit (INDEPENDENT_AMBULATORY_CARE_PROVIDER_SITE_OTHER): Payer: BC Managed Care – PPO | Admitting: Physician Assistant

## 2019-05-18 VITALS — BP 145/92 | HR 77 | Ht 61.75 in | Wt 165.0 lb

## 2019-05-18 DIAGNOSIS — D509 Iron deficiency anemia, unspecified: Secondary | ICD-10-CM | POA: Insufficient documentation

## 2019-05-18 DIAGNOSIS — I10 Essential (primary) hypertension: Secondary | ICD-10-CM

## 2019-05-18 NOTE — Progress Notes (Signed)
I have discussed the procedure for the virtual visit with the patient who has given consent to proceed with assessment and treatment.   Aayla Marrocco S Keyli Duross, CMA     

## 2019-05-18 NOTE — Progress Notes (Signed)
Virtual Visit via Telephone Note  I connected with Emma Stephens on 05/18/19 at  9:00 AM EST by telephone and verified that I am speaking with the correct person using two identifiers.  Location: Patient: Home Provider: [facility]   I discussed the limitations, risks, security and privacy concerns of performing an evaluation and management service by telephone and the availability of in person appointments. I also discussed with the patient that there may be a patient responsible charge related to this service. The patient expressed understanding and agreed to proceed.  History of Present Illness: Patient presents via telephone today as she is unable to do video visit, for hypertension follow-up.  Patient is currently on a regimen of metoprolol 25 mg daily, Cardizem 240 mg daily and spironolactone 25 mg daily.  Patient endorses taking all medications as directed.  Has had recent follow-up with cardiology.  Not having any more symptomatic palpitations.  Patient endorses blood pressures are improved at home but still running typically between 1 [id number] 50 systolic. Patient denies chest pain, palpitations, lightheadedness, dizziness, vision changes or frequent headaches.  Is scheduled for her CPAP titration study for OSA.  Has also continue to work on diet and exercise for weight loss.   Observations/Objective: No labored breathing.  Speech is clear and coherent with logical content.  Patient is alert and oriented at baseline.   Assessment and Plan: 1. Essential hypertension, benign Recent cardiology office visit note reviewed.  BP slightly above goal but much improved for her.  Is asymptomatic.  Do agree with cardiology that sleep apnea is likely contributing to her history of palpitations and blood pressure.  We will have her continue with DASH diet.  Work on mild exercise as per cardiology.  We will continue current medication regimen for now and have her start  on CPAP therapy.  Follow-up in 3 months for reassessment.  She is to continue ambulatory home blood pressure readings.  If anything is climbing she is to let us know ASAP.  Conversely her blood pressure is coming down she is to report this to [place] as well so we can make adjustments in her regimen.  She is overdue for repeat labs to include metabolic panel and lipids.  Lab appointment scheduled for this.  Orders placed.    2. Iron deficiency anemia, unspecified iron deficiency anemia type We will also update CBC at that time giving her history of anemia.  Patient is taking her iron supplement as directed.  Follow Up Instructions:    I discussed the assessment and treatment plan with the patient. The patient was provided an opportunity to ask questions and all were answered. The patient agreed with the plan and demonstrated an understanding of the instructions.   The patient was advised to call back or seek an in-person evaluation if the symptoms worsen or if the condition fails to improve as anticipated.  I provided 10 minutes of non-face-to-face time during this encounter.   Leeanne Rio, PA-C

## 2019-05-21 ENCOUNTER — Ambulatory Visit: Payer: BC Managed Care – PPO

## 2019-05-24 ENCOUNTER — Telehealth: Payer: Self-pay | Admitting: *Deleted

## 2019-05-24 NOTE — Telephone Encounter (Signed)
-----   Message from Lauralee Evener, [place] sent at 05/21/2019  1:37 PM EST ----- Regarding: RE: [place] Patient has no co morbidities. Denied in lab titration. Approved APAP through [employer]. Auth # [id number]. Valid dates 05/21/19 to 08/18/19. I will fax the paper auth to you to send to [facility] with APAP order. ----- Message ----- From: Freada Bergeron, CMA Sent: 05/17/2019   5:15 PM EST To: [facility] Subject: PRECERT                                        TITRATION ----- Message ----- From: Claude Manges, CMA Sent: 05/15/2019   2:50 PM EST To: Freada Bergeron, CMA Subject: INQUIRING ABOUT SLEEP STUDY TITRATION SET UP#

## 2019-05-25 ENCOUNTER — Telehealth: Payer: Self-pay | Admitting: *Deleted

## 2019-05-25 NOTE — Telephone Encounter (Signed)
-----   Message from Sueanne Margarita, MD sent at 05/25/2019 10:34 AM EST ----- Regarding: RE: APAP approved in lab denied Order Resmed CPAP from 4 to 20cm H2O with heated humidity and mask of [facility] with 8 week followup with me Traci ----- Message ----- From: Freada Bergeron, CMA Sent: 05/24/2019   1:59 PM EST To: Sueanne Margarita, MD Subject: APAP approved in lab denied                    Please write settings. ----- Message ----- From: Lauralee Evener, CMA Sent: 05/21/2019   1:37 PM EST To: Freada Bergeron, CMA Subject: RE: [place]                                    Patient has no co morbidities. Denied in lab titration. Approved APAP through [employer]. Auth # [id number]. Valid dates 05/21/19 to 08/18/19. I will fax the paper auth to you to send to [facility] with APAP order. ----- Message ----- From: Freada Bergeron, CMA Sent: 05/17/2019   5:15 PM EST To: [facility] Subject: PRECERT                                        TITRATION ----- Message ----- From: Claude Manges, CMA Sent: 05/15/2019   2:50 PM EST To: Freada Bergeron, CMA Subject: INQUIRING ABOUT SLEEP STUDY TITRATION SET UP#

## 2019-05-25 NOTE — Telephone Encounter (Signed)
Order placed to [facility] via fax. Upon patient request DME selection is [facility]. Patient understands she will be contacted by [facility] to set up her cpap. Patient understands to call if [facility] does not contact her with new setup in a timely manner. Patient understands they will be called once confirmation has been received from [facility] that they have received their new machine to schedule 10 week follow up appointment.  [facility] notified of new cpap order  Please add to airview Patient was grateful for the call and thanked me.

## 2019-05-30 ENCOUNTER — Other Ambulatory Visit: Payer: Self-pay

## 2019-05-30 ENCOUNTER — Ambulatory Visit (INDEPENDENT_AMBULATORY_CARE_PROVIDER_SITE_OTHER): Payer: BC Managed Care – PPO

## 2019-05-30 DIAGNOSIS — D509 Iron deficiency anemia, unspecified: Secondary | ICD-10-CM | POA: Diagnosis not present

## 2019-05-30 DIAGNOSIS — I10 Essential (primary) hypertension: Secondary | ICD-10-CM

## 2019-05-30 LAB — CBC WITH DIFFERENTIAL/PLATELET
Basophils Absolute: 0 10*3/uL (ref 0.0–0.1)
Basophils Relative: 0.5 % (ref 0.0–3.0)
Eosinophils Absolute: 0.1 10*3/uL (ref 0.0–0.7)
Eosinophils Relative: 2.6 % (ref 0.0–5.0)
HCT: 39.4 % (ref 36.0–46.0)
Hemoglobin: 13.3 g/dL (ref 12.0–15.0)
Lymphocytes Relative: 44.4 % (ref 12.0–46.0)
Lymphs Abs: 2.4 10*3/uL (ref 0.7–4.0)
MCHC: 33.7 g/dL (ref 30.0–36.0)
MCV: 90.3 fl (ref 78.0–100.0)
Monocytes Absolute: 0.4 10*3/uL (ref 0.1–1.0)
Monocytes Relative: 6.9 % (ref 3.0–12.0)
Neutro Abs: 2.5 10*3/uL (ref 1.4–7.7)
Neutrophils Relative %: 45.6 % (ref 43.0–77.0)
Platelets: 358 10*3/uL (ref 150.0–400.0)
RBC: 4.37 Mil/uL (ref 3.87–5.11)
RDW: 13.3 % (ref 11.5–15.5)
WBC: 5.4 10*3/uL (ref 4.0–10.5)

## 2019-05-30 LAB — COMPREHENSIVE METABOLIC PANEL
ALT: 10 U/L (ref 0–35)
AST: 13 U/L (ref 0–37)
Albumin: 4.4 g/dL (ref 3.5–5.2)
Alkaline Phosphatase: 74 U/L (ref 39–117)
BUN: 11 mg/dL (ref 6–23)
CO2: 28 mEq/L (ref 19–32)
Calcium: 9.4 mg/dL (ref 8.4–10.5)
Chloride: 104 mEq/L (ref 96–112)
Creatinine, Ser: 0.71 mg/dL (ref 0.40–1.20)
GFR: 105.37 mL/min (ref >60.00)
Glucose, Bld: 87 mg/dL (ref 70–99)
Potassium: 3.9 mEq/L (ref 3.5–5.1)
Sodium: 138 mEq/L (ref 135–145)
Total Bilirubin: 0.5 mg/dL (ref 0.2–1.2)
Total Protein: 7.2 g/dL (ref 6.0–8.3)

## 2019-05-30 LAB — LIPID PANEL
Cholesterol: 155 mg/dL (ref 0–200)
HDL: 38.8 mg/dL — ABNORMAL LOW (ref >39.00)
LDL Cholesterol: 98 mg/dL (ref 0–99)
NonHDL: 116.47
Total CHOL/HDL Ratio: 4
Triglycerides: 93 mg/dL (ref 0.0–149.0)
VLDL: 18.6 mg/dL (ref 0.0–40.0)

## 2019-06-04 ENCOUNTER — Other Ambulatory Visit: Payer: Self-pay | Admitting: Physician Assistant

## 2019-06-04 NOTE — Telephone Encounter (Signed)
Xanax last rx 03/06/19 #30 1 RF LOV: 05/18/19 HTN

## 2019-06-06 ENCOUNTER — Other Ambulatory Visit: Payer: Self-pay | Admitting: Physician Assistant

## 2019-06-08 ENCOUNTER — Encounter: Payer: Self-pay | Admitting: Physician Assistant

## 2019-07-02 ENCOUNTER — Other Ambulatory Visit: Payer: Self-pay

## 2019-07-02 DIAGNOSIS — G4733 Obstructive sleep apnea (adult) (pediatric): Secondary | ICD-10-CM | POA: Diagnosis not present

## 2019-07-02 MED ORDER — SPIRONOLACTONE 25 MG PO TABS: 25.0000 mg | ORAL_TABLET | Freq: Every day | ORAL | 3 refills | Status: DC

## 2019-07-21 ENCOUNTER — Other Ambulatory Visit: Payer: Self-pay | Admitting: Physician Assistant

## 2019-07-21 ENCOUNTER — Encounter: Payer: Self-pay | Admitting: Physician Assistant

## 2019-08-01 DIAGNOSIS — G4733 Obstructive sleep apnea (adult) (pediatric): Secondary | ICD-10-CM | POA: Diagnosis not present

## 2019-08-06 ENCOUNTER — Encounter: Payer: Self-pay | Admitting: Physician Assistant

## 2019-08-06 ENCOUNTER — Other Ambulatory Visit: Payer: Self-pay | Admitting: Physician Assistant

## 2019-08-06 NOTE — Telephone Encounter (Signed)
Alprazolam LFD 06/04/19 #30 with 1 refill LOV 05/18/19 No future visit scheduled.

## 2019-08-13 ENCOUNTER — Telehealth: Payer: Self-pay | Admitting: *Deleted

## 2019-08-13 NOTE — Telephone Encounter (Signed)
Patient has a 10 week follow up appointment scheduled for 08/22/19 9:40. Patient understands she needs to keep this appointment for insurance compliance. Patient was grateful for the call and thanked me.

## 2019-08-13 NOTE — Telephone Encounter (Signed)
YOUR CARDIOLOGY TEAM HAS ARRANGED FOR AN E-VISIT FOR YOUR APPOINTMENT - PLEASE REVIEW IMPORTANT INFORMATION BELOW SEVERAL DAYS PRIOR TO YOUR APPOINTMENT  Due to the recent COVID-19 pandemic, we are transitioning in-person office visits to tele-medicine visits in an effort to decrease unnecessary exposure to our patients, their families, and staff. These visits are billed to your insurance just like a normal visit is. We also encourage you to sign up for MyChart if you have not already done so. You will need a smartphone if possible. For patients that do not have this, we can still complete the visit using a regular telephone but do prefer a smartphone to enable video when possible. You may have a family member that lives with you that can help. If possible, we also ask that you have a blood pressure cuff and scale at home to measure your blood pressure, heart rate and weight prior to your scheduled appointment. Patients with clinical needs that need an in-person evaluation and testing will still be able to come to the office if absolutely necessary. If you have any questions, feel free to call our office.     YOUR PROVIDER WILL BE USING THE FOLLOWING PLATFORM TO COMPLETE YOUR VISIT: Staff: Please delete this text and fill in MyChart/[email or web address]  . IF USING MYCHART - How to Download the MyChart App to Your SmartPhone   - If Apple, go to App Store and type in MyChart in the search bar and download the app. If Android, ask patient to go to Google Play Store and type in MyChart in the search bar and download the app. The app is free but as with any other app downloads, your phone may require you to verify saved payment information or Apple/Android password.  - You will need to then log into the app with your MyChart username and password, and select [facility] as your healthcare provider to link the account.  - When it is time for your visit, go to the MyChart app, find appointments, and click Begin  Video Visit. Be sure to Select Allow for your device to access the Microphone and Camera for your visit. You will then be connected, and your provider will be with you shortly.  **If you have any issues connecting or need assistance, please contact MyChart service desk [phone] ([phone])**  **If using a computer, in order to ensure the best quality for your visit, you will need to use either of the following Internet Browsers: Google Chrome or Microsoft Edge**  . IF USING DOXIMITY or DOXY.ME - The staff will give you instructions on receiving your link to join the meeting the day of your visit.      THE DAY OF YOUR APPOINTMENT  Approximately 15 minutes prior to your scheduled appointment, you will receive a telephone call from one of [facility] team - your caller ID may say "Unknown caller."  Our staff will confirm medications, vital signs for the day and any symptoms you may be experiencing. Please have this information available prior to the time of visit start. It may also be helpful for you to have a pad of paper and pen handy for any instructions given during your visit. They will also walk you through joining the smartphone meeting if this is a video visit.    CONSENT FOR TELE-HEALTH VISIT - PLEASE REVIEW  I hereby voluntarily request, consent and authorize [facility] and its employed or contracted physicians, physician assistants, nurse practitioners or other licensed health care professionals (the   Practitioner), to provide me with telemedicine health care services (the "Services") as deemed necessary by the treating Practitioner. I acknowledge and consent to receive the Services by the Practitioner via telemedicine. I understand that the telemedicine visit will involve communicating with the Practitioner through live audiovisual communication technology and the disclosure of certain medical information by electronic transmission. I acknowledge that I have been given the  opportunity to request an in-person assessment or other available alternative prior to the telemedicine visit and am voluntarily participating in the telemedicine visit.  I understand that I have the right to withhold or withdraw my consent to the use of telemedicine in the course of my care at any time, without affecting my right to future care or treatment, and that the Practitioner or I may terminate the telemedicine visit at any time. I understand that I have the right to inspect all information obtained and/or recorded in the course of the telemedicine visit and may receive copies of available information for a reasonable fee.  I understand that some of the potential risks of receiving the Services via telemedicine include:  . Delay or interruption in medical evaluation due to technological equipment failure or disruption; . Information transmitted may not be sufficient (e.g. poor resolution of images) to allow for appropriate medical decision making by the Practitioner; and/or  . In rare instances, security protocols could fail, causing a breach of personal health information.  Furthermore, I acknowledge that it is my responsibility to provide information about my medical history, conditions and care that is complete and accurate to the best of my ability. I acknowledge that Practitioner's advice, recommendations, and/or decision may be based on factors not within their control, such as incomplete or inaccurate data provided by me or distortions of diagnostic images or specimens that may result from electronic transmissions. I understand that the practice of medicine is not an exact science and that Practitioner makes no warranties or guarantees regarding treatment outcomes. I acknowledge that I will receive a copy of this consent concurrently upon execution via email to the email address I last provided but may also request a printed copy by calling the office of [facility].    I understand that  my insurance will be billed for this visit.   I have read or had this consent read to me. . I understand the contents of this consent, which adequately explains the benefits and risks of the Services being provided via telemedicine.  . I have been provided ample opportunity to ask questions regarding this consent and the Services and have had my questions answered to my satisfaction. . I give my informed consent for the services to be provided through the use of telemedicine in my medical care  By participating in this telemedicine visit I agree to the above. 

## 2019-08-22 ENCOUNTER — Telehealth: Payer: Self-pay | Admitting: *Deleted

## 2019-08-22 ENCOUNTER — Telehealth (INDEPENDENT_AMBULATORY_CARE_PROVIDER_SITE_OTHER): Payer: BC Managed Care – PPO | Admitting: Cardiology

## 2019-08-22 ENCOUNTER — Encounter: Payer: Self-pay | Admitting: Cardiology

## 2019-08-22 ENCOUNTER — Other Ambulatory Visit: Payer: Self-pay

## 2019-08-22 VITALS — Temp 97.0°F | Ht 61.75 in | Wt 165.0 lb

## 2019-08-22 DIAGNOSIS — I10 Essential (primary) hypertension: Secondary | ICD-10-CM

## 2019-08-22 DIAGNOSIS — E669 Obesity, unspecified: Secondary | ICD-10-CM

## 2019-08-22 DIAGNOSIS — G4733 Obstructive sleep apnea (adult) (pediatric): Secondary | ICD-10-CM | POA: Diagnosis not present

## 2019-08-22 NOTE — Telephone Encounter (Signed)
-----   Message from Sueanne Margarita, MD sent at 08/22/2019 10:11 AM EDT ----- Failing CPAP so please change her to BiPAP auto with IPAP max 22cm H2O and EPAP min 6cm H2O with PS 5cm H2O and followup with me in 6-8 weeks

## 2019-08-22 NOTE — Telephone Encounter (Signed)
Order placed to [facility] via fax to change her to BiPAP auto with IPAP max 22cm H2O and EPAP min 6cm H2O with PS 5cm H2O.

## 2019-08-22 NOTE — Progress Notes (Signed)
Virtual Visit via Video Note   This visit type was conducted due to national recommendations for restrictions regarding the COVID-19 Pandemic (e.g. social distancing) in an effort to limit this patient's exposure and mitigate transmission in our community.  Due to her co-morbid illnesses, this patient is at least at moderate risk for complications without adequate follow up.  This format is felt to be most appropriate for this patient at this time.  All issues noted in this document were discussed and addressed.  A limited physical exam was performed with this format.  Please refer to the patient's chart for her consent to telehealth for [facility].  Evaluation Performed:  Follow-up visit  This visit type was conducted due to national recommendations for restrictions regarding the COVID-19 Pandemic (e.g. social distancing).  This format is felt to be most appropriate for this patient at this time.  All issues noted in this document were discussed and addressed.  No physical exam was performed (except for noted visual exam findings with Video Visits).  Please refer to the patient's chart (MyChart message for video visits and phone note for telephone visits) for the patient's consent to telehealth for [facility].  Date:  08/22/2019   ID:  Emma Stephens, DOB 1970-09-28, MRN [id number]  Patient Location:  Home  Provider location:   [place]  PCP:  B[place]etta Jeans, PA-C  Cardiologist:  Virl Axe, MD  Sleep Medicine:  Fransico Him, MD Electrophysiologist:  None   Chief Complaint:  OSA  History of Present Illness:    Emma Stephens is a 49 y.o. female who presents via audio/video conferencing for a telehealth visit today.    This is a 49yo female with a hx of PAF, PVCs and HTN.  She was referred by Dr. Caryl Comes for evaluation of OSA due to excessive daytime sleepiness with an Epworth sleepiness score of 11 and HTN as well as PAF.  She denied ever waking up gasping for breath  or snoring but was told that she snored.  She underwent home sleep study which showed severe OSA with an AHI of 68.6/hr and O2 desaturations as low as 82%.  She was started on auto CPAP and is now here for followup.    SHe is doing well with her CPAP device and thinks that she has gotten used to it.  She tolerates the mask and feels the pressure is adequate.  Since going on CPAP she feels rested in the am and has no significant daytime sleepiness.  She denies any significant mouth or nasal dryness or nasal congestion.  She does not think that he snores.  She tries to sleep on her side but ends up on her back.    The patient does not have symptoms concerning for COVID-19 infection (fever, chills, cough, or new shortness of breath).    Prior CV studies:   The following studies were reviewed today:  Sleep study, PAP compliance download  Past Medical History:  Diagnosis Date  . Cigarette nicotine dependence   . Gout 2011  . Hypertension   . PAF (paroxysmal atrial fibrillation) ([place])   . PVC (premature ventricular contraction)    Past Surgical History:  Procedure Laterality Date  . NECK SURGERY    . TUBAL LIGATION       Current Meds  Medication Sig  . ALPRAZolam (XANAX) 0.25 MG tablet TAKE 1 TABLET (0.25 MG TOTAL) BY MOUTH 2 (TWO) TIMES DAILY. (Patient taking differently: Take 0.25 mg by mouth as needed. )  .  cholecalciferol (VITAMIN D3) 25 MCG (1000 UT) tablet Take 1,000 Units by mouth daily.  . Cyanocobalamin (VITAMIN B-12 PO) Take 2 tablets by mouth daily.   Marland Kitchen diltiazem (CARDIZEM CD) 240 MG 24 hr capsule Take 1 capsule (240 mg total) by mouth daily.  . ferrous sulfate 325 (65 FE) MG tablet TAKE 1 TABLET (325 MG TOTAL) BY MOUTH 2 (TWO) TIMES DAILY WITH A MEAL.  . metoprolol succinate (TOPROL-XL) 25 MG 24 hr tablet TAKE 1 TABLET BY MOUTH EVERY DAY  . pantoprazole (PROTONIX) 40 MG tablet TAKE 1 TABLET BY MOUTH EVERY DAY  . spironolactone (ALDACTONE) 25 MG tablet Take 1 tablet (25 mg  total) by mouth daily.     Allergies:   Lisinopril and Wellbutrin [bupropion]   Social History   Tobacco Use  . Smoking status: Current Every Day Smoker    Packs/day: 0.50    Years: 31.00    Pack years: 15.50    Types: Cigarettes  . Smokeless tobacco: Never Used  Substance Use Topics  . Alcohol use: No    Alcohol/week: 0.0 standard drinks  . Drug use: No     Family Hx: The patient's family history includes Cancer (age of onset: 66) in her father; Diabetes in her brother, mother, and sister; Hypertension in her brother, mother, and sister. There is no history of Sudden Cardiac Death or Heart attack.  ROS:   Please see the history of present illness.     All other systems reviewed and are negative.   Labs/Other Tests and Data Reviewed:    Recent Labs: 05/30/2019: ALT 10; BUN 11; Creatinine, Ser 0.71; Hemoglobin 13.3; Platelets 358.0; Potassium 3.9; Sodium 138   Recent Lipid Panel Lab Results  Component Value Date/Time   CHOL 155 05/30/2019 09:02 AM   TRIG 93.0 05/30/2019 09:02 AM   HDL 38.80 (L) 05/30/2019 09:02 AM   CHOLHDL 4 05/30/2019 09:02 AM   LDLCALC 98 05/30/2019 09:02 AM    Wt Readings from Last 3 Encounters:  08/22/19 165 lb (74.8 kg)  05/18/19 165 lb (74.8 kg)  05/15/19 168 lb (76.2 kg)     Objective:    Vital Signs:  Temp (!) 97 F (36.1 C)   Ht 5' 1.75" (1.568 m)   Wt 165 lb (74.8 kg)   BMI 30.42 kg/m    CONSTITUTIONAL:  Well nourished, well developed female in no acute distress.  EYES: anicteric MOUTH: oral mucosa is pink RESPIRATORY: Normal respiratory effort, symmetric expansion CARDIOVASCULAR: No peripheral edema SKIN: No rash, lesions or ulcers MUSCULOSKELETAL: no digital cyanosis NEURO: Cranial Nerves II-XII grossly intact, moves all extremities PSYCH: Intact judgement and insight.  A&O x 3, Mood/affect appropriate   ASSESSMENT & PLAN:    1.  OSA - The pathophysiology of obstructive sleep apnea , it's cardiovascular consequences &  modes of treatment including CPAP were discused with the patient in detail & they evidenced understanding.  The patient is tolerating PAP therapy well without any problems. The PAP download was reviewed today and showed an AHI of 11/hr on auto PAP  with 3% compliance in using more than 4 hours nightly.  The patient has been using and benefiting from PAP use and will continue to benefit from therapy.  -she tells me that she had a lot of stress over the past month and was not using the device.   -I have encouraged her to be compliant with her device using nightly for at least 5 hours nightly. -her average pressure used it  20cm at night and I suspect that this is also contributing to mouth dryness  -Given that her apneas are still too high, recommend changing her to auto BIPAP and followup with me in 8 weeks, -I did stress the importance of compliance or insurance will make her return her device  2.  HTN -BP controlled on exam -continue Cardizem CD 240mg  daily, spiro 25mg  daily and Toprol XL 25mg  daily  3.  Obesity -I have encouraged her to get into a routine exercise program and cut back on carbs and portions.    COVID-19 Education: The signs and symptoms of COVID-19 were discussed with the patient and how to seek care for testing (follow up with PCP or arrange E-visit).  The importance of social distancing was discussed today.  Patient Risk:   After full review of this patient's clinical status, I feel that they are at least moderate risk at this time.  Time:   Today, I have spent 20 minutes on telemedicine discussing medical problems including OSA, HTN, Obesity and reviewing patient's chart including sleep study and PAP compliance dowload.  Medication Adjustments/Labs and Tests Ordered: Current medicines are reviewed at length with the patient today.  Concerns regarding medicines are outlined above.  Tests Ordered: No orders of the defined types were placed in this encounter.  Medication  Changes: No orders of the defined types were placed in this encounter.   Disposition:  Follow up in 6 week(s)  Signed, Fransico Him, MD  08/22/2019 9:56 AM    [facility]

## 2019-08-28 NOTE — Telephone Encounter (Signed)
Please call insurance and explain that she is not compliant because she is intolerant to the high pressure on CPAP and needs to be changed to BiPAP to get compliant

## 2019-08-28 NOTE — Telephone Encounter (Addendum)
[facility] says patient needs to be compliant and using her unit. She may have to start over. She has until July 10 to get compliant and have a office visit or they will pick  Her up because her insurance will not pay for her unit. [employer] will not pay for the bipap unless she is compliant and has high AHI's. Ivin Booty is willing to talk to you about her download usage of only 2/30 days (7%) , AHIa: 8.4 and your office note not matching up. ([facility]- [phone]).

## 2019-08-30 DIAGNOSIS — L602 Onychogryphosis: Secondary | ICD-10-CM | POA: Diagnosis not present

## 2019-08-30 DIAGNOSIS — B353 Tinea pedis: Secondary | ICD-10-CM | POA: Diagnosis not present

## 2019-09-01 DIAGNOSIS — G4733 Obstructive sleep apnea (adult) (pediatric): Secondary | ICD-10-CM | POA: Diagnosis not present

## 2019-09-07 ENCOUNTER — Other Ambulatory Visit: Payer: Self-pay | Admitting: Physician Assistant

## 2019-09-12 DIAGNOSIS — B351 Tinea unguium: Secondary | ICD-10-CM | POA: Diagnosis not present

## 2019-09-20 DIAGNOSIS — B351 Tinea unguium: Secondary | ICD-10-CM | POA: Diagnosis not present

## 2019-09-20 DIAGNOSIS — L602 Onychogryphosis: Secondary | ICD-10-CM | POA: Diagnosis not present

## 2019-09-20 DIAGNOSIS — B353 Tinea pedis: Secondary | ICD-10-CM | POA: Diagnosis not present

## 2019-09-20 NOTE — Telephone Encounter (Addendum)
Today [facility] and I looked at the patients download and she has been using her cpap more with 63% usage. [facility] says she has until 09/29/19 to reach compliance at 70%. I reached out to the patient and encouraged her to keep using her machine everyday and at least 6-7 hours to avoid losing her machine. Patient understands she has to be compliant and have a office visit with the doctor to document that she is failing cpap to move to bipap. Pt is aware and agreeable to treatment.

## 2019-10-01 NOTE — Telephone Encounter (Signed)
[facility] called today and states the patient did not use her machine over the weekend thus making her compliance go down from 63% to 57% which results in her being noncompliant and they are picking he unit up today. To restart she must have an office visit to document her non-complice, another sleep study, and cpap titration and then bipap titration.

## 2019-10-02 ENCOUNTER — Other Ambulatory Visit: Payer: Self-pay | Admitting: Physician Assistant

## 2019-10-07 ENCOUNTER — Encounter: Payer: Self-pay | Admitting: Physician Assistant

## 2019-10-08 ENCOUNTER — Other Ambulatory Visit: Payer: Self-pay

## 2019-10-08 MED ORDER — ALPRAZOLAM 0.25 MG PO TABS: 0.2500 mg | ORAL_TABLET | Freq: Two times a day (BID) | ORAL | 1 refills | Status: DC

## 2019-10-08 NOTE — Telephone Encounter (Signed)
Alprazolam LFD 08/06/19 #30 with 1 refill LOV 05/18/19 NOV none

## 2019-10-10 DIAGNOSIS — B353 Tinea pedis: Secondary | ICD-10-CM | POA: Diagnosis not present

## 2019-10-10 DIAGNOSIS — B351 Tinea unguium: Secondary | ICD-10-CM | POA: Diagnosis not present

## 2019-10-11 ENCOUNTER — Encounter: Payer: Self-pay | Admitting: Physician Assistant

## 2019-10-11 ENCOUNTER — Other Ambulatory Visit: Payer: Self-pay | Admitting: Physician Assistant

## 2019-10-11 ENCOUNTER — Other Ambulatory Visit: Payer: Self-pay

## 2019-10-17 ENCOUNTER — Ambulatory Visit: Payer: BC Managed Care – PPO | Admitting: Physician Assistant

## 2019-10-25 ENCOUNTER — Encounter: Payer: Self-pay | Admitting: Physician Assistant

## 2019-10-25 ENCOUNTER — Ambulatory Visit: Payer: BC Managed Care – PPO | Admitting: Physician Assistant

## 2019-10-25 ENCOUNTER — Other Ambulatory Visit: Payer: Self-pay

## 2019-10-25 VITALS — BP 130/82 | HR 80 | Temp 98.1°F | Resp 14 | Ht 61.75 in | Wt 165.0 lb

## 2019-10-25 DIAGNOSIS — F17213 Nicotine dependence, cigarettes, with withdrawal: Secondary | ICD-10-CM | POA: Diagnosis not present

## 2019-10-25 DIAGNOSIS — Z1231 Encounter for screening mammogram for malignant neoplasm of breast: Secondary | ICD-10-CM | POA: Diagnosis not present

## 2019-10-25 DIAGNOSIS — Z1211 Encounter for screening for malignant neoplasm of colon: Secondary | ICD-10-CM

## 2019-10-25 DIAGNOSIS — I10 Essential (primary) hypertension: Secondary | ICD-10-CM | POA: Diagnosis not present

## 2019-10-25 LAB — CBC WITH DIFFERENTIAL/PLATELET
Basophils Absolute: 0.1 10*3/uL (ref 0.0–0.1)
Basophils Relative: 0.8 % (ref 0.0–3.0)
Eosinophils Absolute: 0.2 10*3/uL (ref 0.0–0.7)
Eosinophils Relative: 2.2 % (ref 0.0–5.0)
HCT: 41 % (ref 36.0–46.0)
Hemoglobin: 13.5 g/dL (ref 12.0–15.0)
Lymphocytes Relative: 47.2 % — ABNORMAL HIGH (ref 12.0–46.0)
Lymphs Abs: 3.2 10*3/uL (ref 0.7–4.0)
MCHC: 33 g/dL (ref 30.0–36.0)
MCV: 92.1 fl (ref 78.0–100.0)
Monocytes Absolute: 0.4 10*3/uL (ref 0.1–1.0)
Monocytes Relative: 6.5 % (ref 3.0–12.0)
Neutro Abs: 2.9 10*3/uL (ref 1.4–7.7)
Neutrophils Relative %: 43.3 % (ref 43.0–77.0)
Platelets: 370 10*3/uL (ref 150.0–400.0)
RBC: 4.45 Mil/uL (ref 3.87–5.11)
RDW: 13.2 % (ref 11.5–15.5)
WBC: 6.8 10*3/uL (ref 4.0–10.5)

## 2019-10-25 LAB — BASIC METABOLIC PANEL
BUN: 8 mg/dL (ref 6–23)
CO2: 25 mEq/L (ref 19–32)
Calcium: 9.4 mg/dL (ref 8.4–10.5)
Chloride: 103 mEq/L (ref 96–112)
Creatinine, Ser: 0.69 mg/dL (ref 0.40–1.20)
GFR: 108.72 mL/min (ref >60.00)
Glucose, Bld: 79 mg/dL (ref 70–99)
Potassium: 4.7 mEq/L (ref 3.5–5.1)
Sodium: 136 mEq/L (ref 135–145)

## 2019-10-25 MED ORDER — CHANTIX STARTING MONTH PAK 0.5 MG X 11 & 1 MG X 42 PO TABS: ORAL_TABLET | ORAL | 0 refills | Status: DC

## 2019-10-25 NOTE — Progress Notes (Signed)
Patient presents to clinic today for follow-up of hypertension and tobacco use disorder. Patient also with history of OSA, followed by Cardiology and on CPAP. Has had issue tolerating CPAP so is in the process of being switched to a BiPAP. In regards to her BP, patient is currently on a regimen of Metoprolol XL 25 mg daily and Spironolactone 25 mg daily. Endorses taking medications daily as directed and still tolerating well.  Trying to do better with diet but sodas are an issue for her. Is walking nightly. Is still smoking but only maybe a few puffs of a cigarette and throwing away. Maybe a couple of cigarettes per day. Patient denies chest pain, palpitations, lightheadedness, dizziness, vision changes or frequent headaches.  BP Readings from Last 3 Encounters:  10/25/19 130/82  05/30/19 (!) 150/86  05/18/19 (!) 145/92   Past Medical History:  Diagnosis Date  . Cigarette nicotine dependence   . Gout 2011  . Hypertension   . PAF (paroxysmal atrial fibrillation) ([place])   . PVC (premature ventricular contraction)     Current Outpatient Medications on File Prior to Visit  Medication Sig Dispense Refill  . ALPRAZolam (XANAX) 0.25 MG tablet Take 1 tablet (0.25 mg total) by mouth 2 (two) times daily. 30 tablet 1  . cholecalciferol (VITAMIN D3) 25 MCG (1000 UT) tablet Take 1,000 Units by mouth daily.    . Cyanocobalamin (VITAMIN B-12 PO) Take 2 tablets by mouth daily.     Marland Kitchen diltiazem (CARDIZEM CD) 240 MG 24 hr capsule Take 1 capsule (240 mg total) by mouth daily. 90 capsule 3  . ferrous sulfate 325 (65 FE) MG tablet TAKE 1 TABLET (325 MG TOTAL) BY MOUTH 2 (TWO) TIMES DAILY WITH A MEAL. 180 tablet 1  . metoprolol succinate (TOPROL-XL) 25 MG 24 hr tablet TAKE 1 TABLET BY MOUTH EVERY DAY 30 tablet 0  . pantoprazole (PROTONIX) 40 MG tablet TAKE 1 TABLET BY MOUTH EVERY DAY 90 tablet 1  . spironolactone (ALDACTONE) 25 MG tablet Take 1 tablet (25 mg total) by mouth daily. 90 tablet 3   No current  facility-administered medications on file prior to visit.    Allergies  Allergen Reactions  . Lisinopril Hives and Swelling    Angioedema and facial swelling  . Wellbutrin [Bupropion] Itching    Family History  Problem Relation Age of Onset  . Diabetes Mother   . Hypertension Mother   . Cancer Father 42       oral  . Diabetes Sister   . Hypertension Sister   . Diabetes Brother   . Hypertension Brother   . Sudden Cardiac Death Neg Hx   . Heart attack Neg Hx     Social History   Socioeconomic History  . Marital status: Married    Spouse name: Not on file  . Number of children: Not on file  . Years of education: Not on file  . Highest education level: Not on file  Occupational History  . Occupation: Stage manager: [employer]  Tobacco Use  . Smoking status: Current Every Day Smoker    Packs/day: 0.50    Years: 31.00    Pack years: 15.50    Types: Cigarettes  . Smokeless tobacco: Never Used  Vaping Use  . Vaping Use: Never used  Substance and Sexual Activity  . Alcohol use: No    Alcohol/week: 0.0 standard drinks  . Drug use: No  . Sexual activity: Yes    Partners: Male  Other  Topics Concern  . Not on file  Social History Narrative   Lives in [place] with family.   Social Determinants of Health   Financial Resource Strain:   . Difficulty of Paying Living Expenses:   Food Insecurity:   . Worried About Charity fundraiser in the Last Year:   . Arboriculturist in the Last Year:   Transportation Needs:   . Film/video editor (Medical):   Marland Kitchen Lack of Transportation (Non-Medical):   Physical Activity:   . Days of Exercise per Week:   . Minutes of Exercise per Session:   Stress:   . Feeling of Stress :   Social Connections:   . Frequency of Communication with Friends and Family:   . Frequency of Social Gatherings with Friends and Family:   . Attends Religious Services:   . Active Member of Clubs or Organizations:   . Attends Archivist  Meetings:   Marland Kitchen Marital Status:    Review of Systems - See HPI.  All other ROS are negative.  BP 130/82   Pulse 80   Temp 98.1 F (36.7 C) (Temporal)   Resp 14   Ht 5' 1.75" (1.568 m)   Wt 165 lb (74.8 kg)   SpO2 98%   BMI 30.42 kg/m   Physical Exam Vitals reviewed.  HENT:     Head: Normocephalic and atraumatic.  Eyes:     Conjunctiva/sclera: Conjunctivae normal.  Cardiovascular:     Rate and Rhythm: Normal rate and regular rhythm.     Pulses: Normal pulses.     Heart sounds: Normal heart sounds.  Pulmonary:     Effort: Pulmonary effort is normal.  Musculoskeletal:     Cervical back: Neck supple.  Neurological:     General: No focal deficit present.     Mental Status: She is alert and oriented to person, place, and time.  Psychiatric:        Mood and Affect: Mood normal.    Assessment/Plan: 1. Essential hypertension, benign BP above goal on initial check. Improved on recheck. No changes today. Recheck labs today. Continue DASH diet. She is to continue walking, working up to goal of 150 minutes per week. Feel once she gets BiPAP set up this will help considerably. .  - CBC with Differential/Platelet - Basic metabolic panel  2. Cigarette nicotine dependence with withdrawal Has cut back on smoking but continues to have daily intake. Discussed treatment options. Would like to proceed with Chantix. Rx Sent. Will follow closely.  - varenicline (CHANTIX STARTING MONTH PAK) 0.5 MG X 11 & 1 MG X 42 tablet; Take one 0.5 mg tablet by mouth once daily for 3 days, then increase to one 0.5 mg tablet twice daily for 4 days, then increase to one 1 mg tablet twice daily.  Dispense: 53 tablet; Refill: 0  3. Encounter for screening mammogram for malignant neoplasm of breast Due. Average risk. Agrees to screening mammogram. Order placed.  - MM DIGITAL SCREENING BILATERAL; Future  4. Colon cancer screening Average risk. Asymptomatic. Overdue. Discussed options. She would like to proceed  with colonoscopy.   - Ambulatory referral to Gastroenterology  This visit occurred during the SARS-CoV-2 public health emergency.  Safety protocols were in place, including screening questions prior to the visit, additional usage of staff PPE, and extensive cleaning of exam room while observing appropriate contact time as indicated for disinfecting solutions.     Leeanne Rio, PA-C

## 2019-10-25 NOTE — Patient Instructions (Signed)
Please go to the lab today for blood work.  I will call you with your results. We will alter treatment regimen(s) if indicated by your results.   Continue current BP medication regimen. Make sure to follow a low-salt diet and keep up with walking. I think once you get your BiPAP and are able to use at night on a regular basis, we will start to see further improvement in BP.  You will get a phone call for your screening mammogram. You will also get a phone call to schedule an appointment with GI for screening colonoscopy. If you do not receive calls for each within 10 days, please let me know!   DASH Eating Plan DASH stands for "Dietary Approaches to Stop Hypertension." The DASH eating plan is a healthy eating plan that has been shown to reduce high blood pressure (hypertension). It may also reduce your risk for type 2 diabetes, heart disease, and stroke. The DASH eating plan may also help with weight loss. What are tips for following this plan?  General guidelines  Avoid eating more than 2,300 mg (milligrams) of salt (sodium) a day. If you have hypertension, you may need to reduce your sodium intake to 1,500 mg a day.  Limit alcohol intake to no more than 1 drink a day for nonpregnant women and 2 drinks a day for men. One drink equals 12 oz of beer, 5 oz of wine, or 1 oz of hard liquor.  Work with your health care provider to maintain a healthy body weight or to lose weight. Ask what an ideal weight is for you.  Get at least 30 minutes of exercise that causes your heart to beat faster (aerobic exercise) most days of the week. Activities may include walking, swimming, or biking.  Work with your health care provider or diet and nutrition specialist (dietitian) to adjust your eating plan to your individual calorie needs. Reading food labels   Check food labels for the amount of sodium per serving. Choose foods with less than 5 percent of the Daily Value of sodium. Generally, foods with less  than 300 mg of sodium per serving fit into this eating plan.  To find whole grains, look for the word "whole" as the first word in the ingredient list. Shopping  Buy products labeled as "low-sodium" or "no salt added."  Buy fresh foods. Avoid canned foods and premade or frozen meals. Cooking  Avoid adding salt when cooking. Use salt-free seasonings or herbs instead of table salt or sea salt. Check with your health care provider or pharmacist before using salt substitutes.  Do not fry foods. Cook foods using healthy methods such as baking, boiling, grilling, and broiling instead.  Cook with heart-healthy oils, such as olive, canola, soybean, or sunflower oil. Meal planning  Eat a balanced diet that includes: ? 5 or more servings of fruits and vegetables each day. At each meal, try to fill half of your plate with fruits and vegetables. ? Up to 6-8 servings of whole grains each day. ? Less than 6 oz of lean meat, poultry, or fish each day. A 3-oz serving of meat is about the same size as a deck of cards. One egg equals 1 oz. ? 2 servings of low-fat dairy each day. ? A serving of nuts, seeds, or beans 5 times each week. ? Heart-healthy fats. Healthy fats called Omega-3 fatty acids are found in foods such as flaxseeds and coldwater fish, like sardines, salmon, and mackerel.  Limit how much  you eat of the following: ? Canned or prepackaged foods. ? Food that is high in trans fat, such as fried foods. ? Food that is high in saturated fat, such as fatty meat. ? Sweets, desserts, sugary drinks, and other foods with added sugar. ? Full-fat dairy products.  Do not salt foods before eating.  Try to eat at least 2 vegetarian meals each week.  Eat more home-cooked food and less restaurant, buffet, and fast food.  When eating at a restaurant, ask that your food be prepared with less salt or no salt, if possible. What foods are recommended? The items listed may not be a complete list. Talk  with your dietitian about what dietary choices are best for you. Grains Whole-grain or whole-wheat bread. Whole-grain or whole-wheat pasta. Brown rice. [place]. Bulgur. Whole-grain and low-sodium cereals. Pita bread. Low-fat, low-sodium crackers. Whole-wheat flour tortillas. Vegetables Fresh or frozen vegetables (raw, steamed, roasted, or grilled). Low-sodium or reduced-sodium tomato and vegetable juice. Low-sodium or reduced-sodium tomato sauce and tomato paste. Low-sodium or reduced-sodium canned vegetables. Fruits All fresh, dried, or frozen fruit. Canned fruit in natural juice (without added sugar). Meat and other protein foods Skinless chicken or [place]. Ground chicken or [place]. Pork with fat trimmed off. Fish and seafood. Egg whites. Dried beans, peas, or lentils. Unsalted nuts, nut butters, and seeds. Unsalted canned beans. Lean cuts of beef with fat trimmed off. Low-sodium, lean deli meat. Dairy Low-fat (1%) or fat-free (skim) milk. Fat-free, low-fat, or reduced-fat cheeses. Nonfat, low-sodium ricotta or cottage cheese. Low-fat or nonfat yogurt. Low-fat, low-sodium cheese. Fats and oils Soft margarine without trans fats. Vegetable oil. Low-fat, reduced-fat, or light mayonnaise and salad dressings (reduced-sodium). Canola, safflower, olive, soybean, and sunflower oils. Avocado. Seasoning and other foods Herbs. Spices. Seasoning mixes without salt. Unsalted popcorn and pretzels. Fat-free sweets. What foods are not recommended? The items listed may not be a complete list. Talk with your dietitian about what dietary choices are best for you. Grains Baked goods made with fat, such as croissants, muffins, or some breads. Dry pasta or rice meal packs. Vegetables Creamed or fried vegetables. Vegetables in a cheese sauce. Regular canned vegetables (not low-sodium or reduced-sodium). Regular canned tomato sauce and paste (not low-sodium or reduced-sodium). Regular tomato and vegetable  juice (not low-sodium or reduced-sodium). Angie Fava. Olives. Fruits Canned fruit in a light or heavy syrup. Fried fruit. Fruit in cream or butter sauce. Meat and other protein foods Fatty cuts of meat. Ribs. Fried meat. Berniece Salines. Sausage. Bologna and other processed lunch meats. Salami. Fatback. Hotdogs. Bratwurst. Salted nuts and seeds. Canned beans with added salt. Canned or smoked fish. Whole eggs or egg yolks. Chicken or [place] with skin. Dairy Whole or 2% milk, cream, and half-and-half. Whole or full-fat cream cheese. Whole-fat or sweetened yogurt. Full-fat cheese. Nondairy creamers. Whipped toppings. Processed cheese and cheese spreads. Fats and oils Butter. Stick margarine. Lard. Shortening. Ghee. Bacon fat. Tropical oils, such as coconut, palm kernel, or palm oil. Seasoning and other foods Salted popcorn and pretzels. Onion salt, garlic salt, seasoned salt, table salt, and sea salt. Worcestershire sauce. Tartar sauce. Barbecue sauce. Teriyaki sauce. Soy sauce, including reduced-sodium. Steak sauce. Canned and packaged gravies. Fish sauce. Oyster sauce. Cocktail sauce. Horseradish that you find on the shelf. Ketchup. Mustard. Meat flavorings and tenderizers. Bouillon cubes. Hot sauce and Tabasco sauce. Premade or packaged marinades. Premade or packaged taco seasonings. Relishes. Regular salad dressings. Where to find more information:  National Heart, Lung, and Blood Institute: https://wilson-eaton.com/  American Heart Association: www.heart.org Summary  The DASH eating plan is a healthy eating plan that has been shown to reduce high blood pressure (hypertension). It may also reduce your risk for type 2 diabetes, heart disease, and stroke.  With the DASH eating plan, you should limit salt (sodium) intake to 2,300 mg a day. If you have hypertension, you may need to reduce your sodium intake to 1,500 mg a day.  When on the DASH eating plan, aim to eat more fresh fruits and vegetables, whole grains,  lean proteins, low-fat dairy, and heart-healthy fats.  Work with your health care provider or diet and nutrition specialist (dietitian) to adjust your eating plan to your individual calorie needs. This information is not intended to replace advice given to you by your health care provider. Make sure you discuss any questions you have with your health care provider. Document Revised: 02/18/2017 Document Reviewed: 03/01/2016 Elsevier Patient Education  2020 [employer].

## 2019-10-26 ENCOUNTER — Other Ambulatory Visit: Payer: Self-pay | Admitting: Physician Assistant

## 2019-10-26 MED ORDER — NICOTINE 14 MG/24HR TD PT24
14.0000 mg | MEDICATED_PATCH | Freq: Every day | TRANSDERMAL | 0 refills | Status: DC
Start: 2019-10-26 — End: 2020-08-08

## 2019-11-01 ENCOUNTER — Other Ambulatory Visit: Payer: Self-pay | Admitting: Internal Medicine

## 2019-11-01 DIAGNOSIS — B351 Tinea unguium: Secondary | ICD-10-CM | POA: Diagnosis not present

## 2019-11-01 DIAGNOSIS — B353 Tinea pedis: Secondary | ICD-10-CM | POA: Diagnosis not present

## 2019-11-06 ENCOUNTER — Other Ambulatory Visit: Payer: Self-pay | Admitting: Physician Assistant

## 2019-11-15 ENCOUNTER — Other Ambulatory Visit: Payer: Self-pay

## 2019-11-15 ENCOUNTER — Encounter: Payer: Self-pay | Admitting: Physician Assistant

## 2019-11-15 ENCOUNTER — Ambulatory Visit: Payer: BC Managed Care – PPO | Admitting: Physician Assistant

## 2019-11-15 VITALS — BP 140/90 | HR 77 | Temp 98.0°F | Resp 16 | Ht 61.75 in | Wt 166.0 lb

## 2019-11-15 DIAGNOSIS — R590 Localized enlarged lymph nodes: Secondary | ICD-10-CM | POA: Diagnosis not present

## 2019-11-15 LAB — CBC WITH DIFFERENTIAL/PLATELET
Basophils Absolute: 0.1 10*3/uL (ref 0.0–0.1)
Basophils Relative: 1.5 % (ref 0.0–3.0)
Eosinophils Absolute: 0.1 10*3/uL (ref 0.0–0.7)
Eosinophils Relative: 2.6 % (ref 0.0–5.0)
HCT: 40.9 % (ref 36.0–46.0)
Hemoglobin: 13.5 g/dL (ref 12.0–15.0)
Lymphocytes Relative: 49.6 % — ABNORMAL HIGH (ref 12.0–46.0)
Lymphs Abs: 2.7 10*3/uL (ref 0.7–4.0)
MCHC: 33.1 g/dL (ref 30.0–36.0)
MCV: 90.5 fl (ref 78.0–100.0)
Monocytes Absolute: 0.5 10*3/uL (ref 0.1–1.0)
Monocytes Relative: 8.7 % (ref 3.0–12.0)
Neutro Abs: 2.1 10*3/uL (ref 1.4–7.7)
Neutrophils Relative %: 37.6 % — ABNORMAL LOW (ref 43.0–77.0)
Platelets: 338 10*3/uL (ref 150.0–400.0)
RBC: 4.52 Mil/uL (ref 3.87–5.11)
RDW: 13 % (ref 11.5–15.5)
WBC: 5.5 10*3/uL (ref 4.0–10.5)

## 2019-11-15 NOTE — Progress Notes (Signed)
Patient presents to clinic today c/o mass of her left collarbone region, first noted last night. Notes is slightly tender but not overtly painful. Denies change in the area since first noting. Denies fever, chills. Has had some fatigue for the past 2 days since giving her 2nd covid shot. Denies sore throat. Denies muscle aches.   Past Medical History:  Diagnosis Date  . Cigarette nicotine dependence   . Gout 2011  . Hypertension   . PAF (paroxysmal atrial fibrillation) ([place])   . PVC (premature ventricular contraction)     Current Outpatient Medications on File Prior to Visit  Medication Sig Dispense Refill  . ALPRAZolam (XANAX) 0.25 MG tablet Take 1 tablet (0.25 mg total) by mouth 2 (two) times daily. 30 tablet 1  . cholecalciferol (VITAMIN D3) 25 MCG (1000 UT) tablet Take 1,000 Units by mouth daily.    . Cyanocobalamin (VITAMIN B-12 PO) Take 2 tablets by mouth daily.     Marland Kitchen diltiazem (CARDIZEM CD) 240 MG 24 hr capsule TAKE 1 CAPSULE BY MOUTH EVERY DAY 90 capsule 3  . ferrous sulfate 325 (65 FE) MG tablet TAKE 1 TABLET (325 MG TOTAL) BY MOUTH 2 (TWO) TIMES DAILY WITH A MEAL. 180 tablet 1  . metoprolol succinate (TOPROL-XL) 25 MG 24 hr tablet TAKE 1 TABLET BY MOUTH EVERY DAY 30 tablet 3  . nicotine (NICODERM CQ) 14 mg/24hr patch Place 1 patch (14 mg total) onto the skin daily. 28 patch 0  . pantoprazole (PROTONIX) 40 MG tablet TAKE 1 TABLET BY MOUTH EVERY DAY 90 tablet 1  . spironolactone (ALDACTONE) 25 MG tablet Take 1 tablet (25 mg total) by mouth daily. 90 tablet 3  . varenicline (CHANTIX STARTING MONTH PAK) 0.5 MG X 11 & 1 MG X 42 tablet Take one 0.5 mg tablet by mouth once daily for 3 days, then increase to one 0.5 mg tablet twice daily for 4 days, then increase to one 1 mg tablet twice daily. 53 tablet 0   No current facility-administered medications on file prior to visit.    Allergies  Allergen Reactions  . Lisinopril Hives and Swelling    Angioedema and facial swelling  .  Wellbutrin [Bupropion] Itching    Family History  Problem Relation Age of Onset  . Diabetes Mother   . Hypertension Mother   . Cancer Father 58       oral  . Diabetes Sister   . Hypertension Sister   . Diabetes Brother   . Hypertension Brother   . Sudden Cardiac Death Neg Hx   . Heart attack Neg Hx     Social History   Socioeconomic History  . Marital status: Married    Spouse name: Not on file  . Number of children: Not on file  . Years of education: Not on file  . Highest education level: Not on file  Occupational History  . Occupation: Stage manager: [employer]  Tobacco Use  . Smoking status: Current Every Day Smoker    Packs/day: 0.50    Years: 31.00    Pack years: 15.50    Types: Cigarettes  . Smokeless tobacco: Never Used  Vaping Use  . Vaping Use: Never used  Substance and Sexual Activity  . Alcohol use: No    Alcohol/week: 0.0 standard drinks  . Drug use: No  . Sexual activity: Yes    Partners: Male  Other Topics Concern  . Not on file  Social History Narrative   Lives  in [place] with family.   Social Determinants of Health   Financial Resource Strain:   . Difficulty of Paying Living Expenses: Not on file  Food Insecurity:   . Worried About Charity fundraiser in the Last Year: Not on file  . Ran Out of Food in the Last Year: Not on file  Transportation Needs:   . Lack of Transportation (Medical): Not on file  . Lack of Transportation (Non-Medical): Not on file  Physical Activity:   . Days of Exercise per Week: Not on file  . Minutes of Exercise per Session: Not on file  Stress:   . Feeling of Stress : Not on file  Social Connections:   . Frequency of Communication with Friends and Family: Not on file  . Frequency of Social Gatherings with Friends and Family: Not on file  . Attends Religious Services: Not on file  . Active Member of Clubs or Organizations: Not on file  . Attends Archivist Meetings: Not on file  . Marital  Status: Not on file   Review of Systems - See HPI.  All other ROS are negative.  BP 140/90   Pulse 77   Temp 98 F (36.7 C) (Temporal)   Resp 16   Ht 5' 1.75" (1.568 m)   Wt 166 lb (75.3 kg)   SpO2 99%   BMI 30.61 kg/m   Physical Exam Vitals reviewed.  Constitutional:      Appearance: Normal appearance.  HENT:     Head: Normocephalic and atraumatic.     Right Ear: Tympanic membrane normal.     Left Ear: Tympanic membrane normal.     Mouth/Throat:     Mouth: Mucous membranes are moist.     Pharynx: Oropharynx is clear.  Eyes:     Conjunctiva/sclera: Conjunctivae normal.     Pupils: Pupils are equal, round, and reactive to light.  Cardiovascular:     Rate and Rhythm: Normal rate and regular rhythm.     Pulses: Normal pulses.     Heart sounds: Normal heart sounds.  Pulmonary:     Effort: Pulmonary effort is normal.     Breath sounds: Normal breath sounds.  Musculoskeletal:     Cervical back: Neck supple.  Lymphadenopathy:     Head:     Right side of head: No submental, submandibular, tonsillar, preauricular, posterior auricular or occipital adenopathy.     Left side of head: No submental, submandibular, tonsillar, preauricular, posterior auricular or occipital adenopathy.     Cervical: No cervical adenopathy.     Right cervical: No superficial, deep or posterior cervical adenopathy.    Left cervical: No superficial, deep or posterior cervical adenopathy.     Upper Body:     Right upper body: No supraclavicular adenopathy.     Left upper body: Supraclavicular adenopathy (solitary, mobile and tender 1-2 cm x 2 cm) present. No axillary or epitrochlear adenopathy.  Neurological:     General: No focal deficit present.     Mental Status: She is alert and oriented to person, place, and time.  Psychiatric:        Mood and Affect: Mood normal.     Recent Results (from the past 2160 hour(s))  CBC with Differential/Platelet     Status: Abnormal   Collection Time: 10/25/19  10:36 AM  Result Value Ref Range   WBC 6.8 4.0 - 10.5 K/uL   RBC 4.45 3.87 - 5.11 Mil/uL   Hemoglobin 13.5 12.0 - 15.0  g/dL   HCT 41.0 36 - 46 %   MCV 92.1 78.0 - 100.0 fl   MCHC 33.0 30.0 - 36.0 g/dL   RDW 13.2 11.5 - 15.5 %   Platelets 370.0 150 - 400 K/uL   Neutrophils Relative % 43.3 43 - 77 %   Lymphocytes Relative 47.2 (H) 12 - 46 %   Monocytes Relative 6.5 3 - 12 %   Eosinophils Relative 2.2 0 - 5 %   Basophils Relative 0.8 0 - 3 %   Neutro Abs 2.9 1.4 - 7.7 K/uL   Lymphs Abs 3.2 0.7 - 4.0 K/uL   Monocytes Absolute 0.4 0 - 1 K/uL   Eosinophils Absolute 0.2 0 - 0 K/uL   Basophils Absolute 0.1 0 - 0 K/uL  Basic metabolic panel     Status: None   Collection Time: 10/25/19 10:36 AM  Result Value Ref Range   Sodium 136 135 - 145 mEq/L   Potassium 4.7 3.5 - 5.1 mEq/L   Chloride 103 96 - 112 mEq/L   CO2 25 19 - 32 mEq/L   Glucose, Bld 79 70 - 99 mg/dL   BUN 8 6 - 23 mg/dL   Creatinine, Ser 0.69 0.40 - 1.20 mg/dL   GFR 108.72 >60.00 mL/min   Calcium 9.4 8.4 - 10.5 mg/dL    Assessment/Plan: 1. Lymphadenopathy, supraclavicular First noted last night. Some mild tenderness. This occurring 2 days after second Covid vaccine. Has also noted some mild fatigue. Otherwise asymptomatic. Solitary left-sided supraclavicular lymph node noted, freely mobile and mildly tender to touch. No overlying erythema or other skin changes. No other adenopathy noted on examination. No sign of infection on examination. Given her concerns will check blood count today. If negative recommend supportive measures and watching over the next week as this should calm down. If not improving would proceed with imaging to be cautious.  - CBC w/Diff  This visit occurred during the SARS-CoV-2 public health emergency.  Safety protocols were in place, including screening questions prior to the visit, additional usage of staff PPE, and extensive cleaning of exam room while observing appropriate contact time as indicated  for disinfecting solutions.     Leeanne Rio, PA-C

## 2019-11-15 NOTE — Patient Instructions (Signed)
The area feels consistent with a mildly enlarged lymph node. This is most likely reactive to recent vaccine.  Avoid touching and poking the area.  Please go to the lab today for blood work.  I will call you with your results. We will alter treatment regimen(s) if indicated by your results.   For now I want you to try to relax.  If blood count is normal as expected then we can watch this over the next week as I would expect it to resolve.   If not improving I would recommend we get an ultrasound of the area.

## 2019-11-23 ENCOUNTER — Encounter: Payer: Self-pay | Admitting: Physician Assistant

## 2019-11-23 DIAGNOSIS — R222 Localized swelling, mass and lump, trunk: Secondary | ICD-10-CM

## 2019-11-28 ENCOUNTER — Ambulatory Visit: Payer: BC Managed Care – PPO

## 2019-11-29 ENCOUNTER — Ambulatory Visit (HOSPITAL_BASED_OUTPATIENT_CLINIC_OR_DEPARTMENT_OTHER)
Admission: RE | Admit: 2019-11-29 | Discharge: 2019-11-29 | Disposition: A | Discharge status: Home / Self Care | Payer: BC Managed Care – PPO | Source: Ambulatory Visit | Attending: Physician Assistant | Admitting: Physician Assistant

## 2019-11-29 ENCOUNTER — Other Ambulatory Visit: Payer: Self-pay

## 2019-11-29 DIAGNOSIS — R221 Localized swelling, mass and lump, neck: Secondary | ICD-10-CM | POA: Diagnosis not present

## 2019-11-29 DIAGNOSIS — R222 Localized swelling, mass and lump, trunk: Secondary | ICD-10-CM | POA: Insufficient documentation

## 2019-12-03 ENCOUNTER — Other Ambulatory Visit: Payer: Self-pay | Admitting: Physician Assistant

## 2019-12-04 ENCOUNTER — Encounter: Payer: Self-pay | Admitting: Physician Assistant

## 2019-12-04 ENCOUNTER — Ambulatory Visit (HOSPITAL_BASED_OUTPATIENT_CLINIC_OR_DEPARTMENT_OTHER): Payer: BC Managed Care – PPO

## 2019-12-04 MED ORDER — ALPRAZOLAM 0.25 MG PO TABS: 0.2500 mg | ORAL_TABLET | Freq: Two times a day (BID) | ORAL | 1 refills | Status: DC

## 2019-12-04 NOTE — Telephone Encounter (Signed)
Last OV 11/15/19 Alprazolam last filled 10/08/19 #30 with 1

## 2019-12-10 ENCOUNTER — Telehealth (INDEPENDENT_AMBULATORY_CARE_PROVIDER_SITE_OTHER): Payer: BC Managed Care – PPO | Admitting: Cardiology

## 2019-12-10 ENCOUNTER — Telehealth: Payer: Self-pay | Admitting: *Deleted

## 2019-12-10 ENCOUNTER — Encounter: Payer: Self-pay | Admitting: Cardiology

## 2019-12-10 ENCOUNTER — Other Ambulatory Visit: Payer: Self-pay

## 2019-12-10 VITALS — Ht 61.75 in | Wt 168.0 lb

## 2019-12-10 DIAGNOSIS — I10 Essential (primary) hypertension: Secondary | ICD-10-CM | POA: Diagnosis not present

## 2019-12-10 DIAGNOSIS — G4733 Obstructive sleep apnea (adult) (pediatric): Secondary | ICD-10-CM | POA: Diagnosis not present

## 2019-12-10 DIAGNOSIS — E669 Obesity, unspecified: Secondary | ICD-10-CM

## 2019-12-10 NOTE — Progress Notes (Signed)
Virtual Visit via Telephone Note   This visit type was conducted due to national recommendations for restrictions regarding the COVID-19 Pandemic (e.g. social distancing) in an effort to limit this patient's exposure and mitigate transmission in our community.  Due to her co-morbid illnesses, this patient is at least at moderate risk for complications without adequate follow up.  This format is felt to be most appropriate for this patient at this time.  All issues noted in this document were discussed and addressed.  A limited physical exam was performed with this format.  Please refer to the patient's chart for her consent to telehealth for [facility]   Evaluation Performed:  Follow-up visit  This visit type was conducted due to national recommendations for restrictions regarding the COVID-19 Pandemic (e.g. social distancing).  This format is felt to be most appropriate for this patient at this time.  All issues noted in this document were discussed and addressed.  No physical exam was performed (except for noted visual exam findings with Video Visits).  Please refer to the patient's chart (MyChart message for video visits and phone note for telephone visits) for the patient's consent to telehealth for [facility].  Date:  12/10/2019   ID:  Emma Stephens, DOB 26-Dec-1970, MRN [id number]  Patient Location:  Home  Provider location:   [place]  PCP:  Brunetta Jeans, PA-C  Cardiologist:  Virl Axe, MD  Sleep Medicine:  Fransico Him, MD Electrophysiologist:  None   Chief Complaint:  OSA  History of Present Illness:    Emma Stephens is a 49 y.o. female who presents via audio/video conferencing for a telehealth visit today.    This is a 49yo female with a hx of PAF, PVCs and HTN.  She was referred by Dr. Caryl Comes for evaluation of OSA due to excessive daytime sleepiness with an Epworth sleepiness score of 11 and HTN as well as PAF.  She denied ever waking up gasping for  breath or snoring but was told that she snored.  She underwent home sleep study which showed severe OSA with an AHI of 68.6/hr and O2 desaturations as low as 82%.  She was started on auto CPAP and is now here for followup.    When I saw her last her download showed an elevated AHI of 11/hr and was not compliant with her device. I changed her to BiPAP auto. Unfortunately her insurance required her to show compliance with her CPAP before they would approve a BIPAP device and she was not compliant and had to turn her device.  She is now here per insurance requirements to get set up for repeat sleep study and repeat CPAP/BiPAP study before getting back on PAP therapy.  The patient does not have symptoms concerning for COVID-19 infection (fever, chills, cough, or new shortness of breath).   Prior CV studies:   The following studies were reviewed today:  Sleep study, PAP compliance download  Past Medical History:  Diagnosis Date  . Cigarette nicotine dependence   . Gout 2011  . Hypertension   . PAF (paroxysmal atrial fibrillation) ([place])   . PVC (premature ventricular contraction)    Past Surgical History:  Procedure Laterality Date  . NECK SURGERY    . TUBAL LIGATION       Current Meds  Medication Sig  . ALPRAZolam (XANAX) 0.25 MG tablet Take 1 tablet (0.25 mg total) by mouth 2 (two) times daily.  . cholecalciferol (VITAMIN D3) 25 MCG (1000 UT) tablet Take  1,000 Units by mouth daily.  . Cyanocobalamin (VITAMIN B-12 PO) Take 1 tablet by mouth daily.   Marland Kitchen diltiazem (CARDIZEM CD) 240 MG 24 hr capsule TAKE 1 CAPSULE BY MOUTH EVERY DAY  . ferrous sulfate 325 (65 FE) MG tablet TAKE 1 TABLET (325 MG TOTAL) BY MOUTH 2 (TWO) TIMES DAILY WITH A MEAL.  . metoprolol succinate (TOPROL-XL) 25 MG 24 hr tablet TAKE 1 TABLET BY MOUTH EVERY DAY  . nicotine (NICODERM CQ) 14 mg/24hr patch Place 1 patch (14 mg total) onto the skin daily.  . pantoprazole (PROTONIX) 40 MG tablet TAKE 1 TABLET BY MOUTH EVERY DAY   . spironolactone (ALDACTONE) 25 MG tablet Take 1 tablet (25 mg total) by mouth daily.     Allergies:   Lisinopril and Wellbutrin [bupropion]   Social History   Tobacco Use  . Smoking status: Current Every Day Smoker    Packs/day: 0.50    Years: 31.00    Pack years: 15.50    Types: Cigarettes  . Smokeless tobacco: Never Used  Vaping Use  . Vaping Use: Never used  Substance Use Topics  . Alcohol use: No    Alcohol/week: 0.0 standard drinks  . Drug use: No     Family Hx: The patient's family history includes Cancer (age of onset: 39) in her father; Diabetes in her brother, mother, and sister; Hypertension in her brother, mother, and sister. There is no history of Sudden Cardiac Death or Heart attack.  ROS:   Please see the history of present illness.     All other systems reviewed and are negative.   Labs/Other Tests and Data Reviewed:    Recent Labs: 05/30/2019: ALT 10 10/25/2019: BUN 8; Creatinine, Ser 0.69; Potassium 4.7; Sodium 136 11/15/2019: Hemoglobin 13.5; Platelets 338.0   Recent Lipid Panel Lab Results  Component Value Date/Time   CHOL 155 05/30/2019 09:02 AM   TRIG 93.0 05/30/2019 09:02 AM   HDL 38.80 (L) 05/30/2019 09:02 AM   CHOLHDL 4 05/30/2019 09:02 AM   LDLCALC 98 05/30/2019 09:02 AM    Wt Readings from Last 3 Encounters:  11/15/19 166 lb (75.3 kg)  10/25/19 165 lb (74.8 kg)  08/22/19 165 lb (74.8 kg)     Objective:    Vital Signs:  There were no vitals taken for this visit.    ASSESSMENT & PLAN:    1.  OSA  -She was not compliant with her last CPAP device because it had to be set at a high setting and still was not controlling her apneas. She could not tolerate the high pressure. -unfortunately her insurance would not work with her and wanted her compliant on CPAP before going to BIPAP and since she could not tolerate CPAP she was not compliant. -I will order a split night study per insurance requirements to get her back on PAP therapy  2.   HTN -continue Cardizem CD 240mg  daily, spiro 25mg  daily and Toprol XL 25mg  daily  3.  Obesity -I have encouraged her to get into a routine exercise program and cut back on carbs and portions.    COVID-19 Education: The signs and symptoms of COVID-19 were discussed with the patient and how to seek care for testing (follow up with PCP or arrange E-visit).  The importance of social distancing was discussed today.  Patient Risk:   After full review of this patient's clinical status, I feel that they are at least moderate risk at this time.  Time:   Today, I  have spent 20 minutes on telemedicine discussing medical problems including OSA, HTN, Obesity and reviewing patient's chart including sleep study and PAP compliance dowload.  Medication Adjustments/Labs and Tests Ordered: Current medicines are reviewed at length with the patient today.  Concerns regarding medicines are outlined above.  Tests Ordered: No orders of the defined types were placed in this encounter.  Medication Changes: No orders of the defined types were placed in this encounter.   Disposition:  Follow up after split night sleep study  Signed, Fransico Him, MD  12/10/2019 9:04 AM    [place]

## 2019-12-10 NOTE — Addendum Note (Signed)
Addended by: Antonieta Iba on: 12/10/2019 09:23 AM   Modules accepted: Orders

## 2019-12-10 NOTE — Telephone Encounter (Signed)
-----   Message from Antonieta Iba, RN sent at 12/10/2019  9:37 AM EDT ----- Split night sleep study has been ordered. Thanks!

## 2019-12-10 NOTE — Patient Instructions (Signed)
Medication Instructions:  Your physician recommends that you continue on your current medications as directed. Please refer to the Current Medication list given to you today.  *If you need a refill on your cardiac medications before your next appointment, please call your pharmacy*   Testing/Procedures: Your physician has recommended that you have a sleep study. This test records several body functions during sleep, including: brain activity, eye movement, oxygen and carbon dioxide blood levels, heart rate and rhythm, breathing rate and rhythm, the flow of air through your mouth and nose, snoring, body muscle movements, and chest and belly movement.  Follow-Up: At [facility], you and your health needs are our priority.  As part of our continuing mission to provide you with exceptional heart care, we have created designated Provider Care Teams.  These Care Teams include your primary Cardiologist (physician) and Advanced Practice Providers (APPs -  Physician Assistants and Nurse Practitioners) who all work together to provide you with the care you need, when you need it.  Follow up with Dr. Turner after sleep study  

## 2019-12-14 ENCOUNTER — Encounter: Payer: Self-pay | Admitting: Gastroenterology

## 2019-12-14 ENCOUNTER — Telehealth: Payer: Self-pay | Admitting: *Deleted

## 2019-12-14 NOTE — Telephone Encounter (Signed)
Staff message sent to Nina ok to schedule sleep study. Per [employer] web portal no PA is required. 

## 2019-12-14 NOTE — Telephone Encounter (Signed)
Patient is scheduled for lab study on 01/24/20. Patient understands her sleep study will be done at [facility] sleep lab. Patient understands she will receive a sleep packet in a week or so. Patient understands to call if she does not receive the sleep packet in a timely manner.  Left detailed message on voicemail with date and time of titration and informed patient to call back to confirm or reschedule.

## 2019-12-14 NOTE — Telephone Encounter (Signed)
Lauralee Evener, CMA  Freada Bergeron, CMA Ok to schedule sleep study. Per [employer] portal no PA IS REQUIRED.

## 2020-01-02 ENCOUNTER — Encounter: Payer: Self-pay | Admitting: Physician Assistant

## 2020-01-02 ENCOUNTER — Other Ambulatory Visit: Payer: Self-pay | Admitting: Emergency Medicine

## 2020-01-02 ENCOUNTER — Other Ambulatory Visit: Payer: Self-pay | Admitting: Physician Assistant

## 2020-01-02 NOTE — Telephone Encounter (Signed)
Xanax last rx 12/04/19 #30 1RF LOV: 11/15/19 Lymphadenopathy

## 2020-01-14 ENCOUNTER — Ambulatory Visit (HOSPITAL_BASED_OUTPATIENT_CLINIC_OR_DEPARTMENT_OTHER)
Admission: RE | Admit: 2020-01-14 | Discharge: 2020-01-14 | Disposition: A | Discharge status: Home / Self Care | Payer: BC Managed Care – PPO | Source: Ambulatory Visit | Attending: Physician Assistant | Admitting: Physician Assistant

## 2020-01-14 ENCOUNTER — Encounter (HOSPITAL_BASED_OUTPATIENT_CLINIC_OR_DEPARTMENT_OTHER): Payer: Self-pay

## 2020-01-14 ENCOUNTER — Ambulatory Visit (HOSPITAL_BASED_OUTPATIENT_CLINIC_OR_DEPARTMENT_OTHER): Payer: BC Managed Care – PPO

## 2020-01-14 ENCOUNTER — Other Ambulatory Visit: Payer: Self-pay

## 2020-01-14 DIAGNOSIS — Z1231 Encounter for screening mammogram for malignant neoplasm of breast: Secondary | ICD-10-CM | POA: Insufficient documentation

## 2020-01-17 ENCOUNTER — Other Ambulatory Visit: Payer: Self-pay

## 2020-01-17 ENCOUNTER — Encounter: Payer: Self-pay | Admitting: Gastroenterology

## 2020-01-17 ENCOUNTER — Ambulatory Visit (AMBULATORY_SURGERY_CENTER): Payer: Self-pay | Admitting: *Deleted

## 2020-01-17 VITALS — Ht 61.75 in | Wt 163.0 lb

## 2020-01-17 DIAGNOSIS — Z1211 Encounter for screening for malignant neoplasm of colon: Secondary | ICD-10-CM

## 2020-01-17 NOTE — Progress Notes (Signed)
Completed covid vaccines 11-13-19  Pt is aware that care partner will wait in the car during procedure; if they feel like they will be too hot or cold to wait in the car; they may wait in the 4 th floor lobby. Patient is aware to bring only one care partner. We want them to wear a mask (we do not have any that we can provide them), practice social distancing, and we will check their temperatures when they get here.  I did remind the patient that their care partner needs to stay in the parking lot the entire time and have a cell phone available, we will call them when the pt is ready for discharge. Patient will wear mask into building.   No trouble with anesthesia, difficulty with intubation or hx/fam hx of malignant hyperthermia per pt   No egg or soy allergy  No home oxygen use   No medications for weight loss taken  emmi information given  Pt denies constipation issues

## 2020-01-21 DIAGNOSIS — Z0000 Encounter for general adult medical examination without abnormal findings: Secondary | ICD-10-CM | POA: Diagnosis not present

## 2020-01-21 DIAGNOSIS — Z124 Encounter for screening for malignant neoplasm of cervix: Secondary | ICD-10-CM | POA: Diagnosis not present

## 2020-01-24 ENCOUNTER — Other Ambulatory Visit: Payer: Self-pay

## 2020-01-24 ENCOUNTER — Ambulatory Visit (HOSPITAL_BASED_OUTPATIENT_CLINIC_OR_DEPARTMENT_OTHER): Payer: BC Managed Care – PPO | Attending: Cardiology | Admitting: Cardiology

## 2020-01-24 DIAGNOSIS — G4733 Obstructive sleep apnea (adult) (pediatric): Secondary | ICD-10-CM | POA: Diagnosis not present

## 2020-01-31 ENCOUNTER — Encounter: Payer: Self-pay | Admitting: Gastroenterology

## 2020-01-31 ENCOUNTER — Other Ambulatory Visit: Payer: Self-pay

## 2020-01-31 ENCOUNTER — Ambulatory Visit (AMBULATORY_SURGERY_CENTER): Payer: BC Managed Care – PPO | Admitting: Gastroenterology

## 2020-01-31 VITALS — BP 140/77 | HR 67 | Temp 98.0°F | Resp 18 | Ht 61.75 in | Wt 163.0 lb

## 2020-01-31 DIAGNOSIS — Z1211 Encounter for screening for malignant neoplasm of colon: Secondary | ICD-10-CM | POA: Diagnosis not present

## 2020-01-31 DIAGNOSIS — D125 Benign neoplasm of sigmoid colon: Secondary | ICD-10-CM

## 2020-01-31 DIAGNOSIS — K635 Polyp of colon: Secondary | ICD-10-CM | POA: Diagnosis not present

## 2020-01-31 MED ORDER — SODIUM CHLORIDE 0.9 % IV SOLN: 500.0000 mL | Freq: Once | INTRAVENOUS | Status: DC

## 2020-01-31 NOTE — Procedures (Signed)
   Patient Name: Emma Stephens, Emma Stephens Date: 01/24/2020 Gender: Female D.O.B: 1970/11/15 Age (years): 70 Referring Provider: Virl Axe Height (inches): 89 Interpreting Physician: Fransico Him MD, ABSM Weight (lbs): 165 RPSGT: Baxter Flattery BMI: 31 MRN: [id number] Neck Size: 15.00 <br> <br> CLINICAL INFORMATION The patient is referred for a split night study with BPAP.  MEDICATIONS Medications self-administered by patient taken the night of the study : N/A  SLEEP STUDY TECHNIQUE As per the AASM Manual for the Scoring of Sleep and Associated Events v2.3 (April 2016) with a hypopnea requiring 4% desaturations.  The channels recorded and monitored were frontal, central and occipital EEG, electrooculogram (EOG), submentalis EMG (chin), nasal and oral airflow, thoracic and abdominal wall motion, anterior tibialis EMG, snore microphone, electrocardiogram, and pulse oximetry. Bi-level positive airway pressure (BiPAP) was initiated when the patient met split night criteria and was titrated according to treat sleep-disordered breathing.  RESPIRATORY PARAMETERS Diagnostic Total AHI (/hr): 57.4  RDI (/hr):58.5  OA Index (/hr): 52.2  CA Index (/hr): 0.0 REM AHI (/hr): 83.1  NREM AHI (/hr):53.9  Supine AHI (/hr):52.1  Non-supine AHI (/hr):65.08 Min O2 Sat (%):86.00  Mean O2 (%):96.39  Time below 88% (min):0.4   Titration Optimal IPAP Pressure (cm): N/A  Optimal EPAP Pressure (cm):N/A  AHI at Optimal Pressure (/hr):N/A  Min O2 at Optimal Pressure (%):N/A Sleep % at Optimal (%):100  Supine % at Optimal (%): 0   SLEEP ARCHITECTURE The study was initiated at 9:44:53 PM and terminated at 4:44:42 AM. The total recorded time was 419.8 minutes. EEG confirmed total sleep time was 360 minutes yielding a sleep efficiency of 85.8%. Sleep onset after lights out was 8.8 minutes with a REM latency of 156.5 minutes. The patient spent 19.86% of the night in stage N1 sleep, 61.39% in stage N2  sleep, 0.00% in stage N3 and 18.8% in REM. Wake after sleep onset (WASO) was 51.0 minutes. The Arousal Index was 8.2/hour.  LEG MOVEMENT DATA The total Periodic Limb Movements of Sleep (PLMS) were 1. The PLMS index was 0.17 .  CARDIAC DATA The 2 lead EKG demonstrated sinus rhythm. The mean heart rate was 69.77 beats per minute. Other EKG findings include: None.  IMPRESSIONS - Severe obstructive sleep apnea occurred during the diagnostic portion of the study (AHI = 57.4 /hour). An optimal PAP pressure could not be selected for this patient due to ongoing respiratory events. - No significant central sleep apnea occurred during the diagnostic portion of the study (CAI = 0.0/hour). - Mild oxygen desaturation was noted during the diagnostic portion of the study (Min O2 = 86.00%). - No snoring was audible during this study. - No cardiac abnormalities were noted during this study. - Clinically significant periodic limb movements of sleep did not occur during the study.  DIAGNOSIS - Obstructive Sleep Apnea (G47.33)  RECOMMENDATIONS - Trial of auto BiPAP therapy with IPAP max 20cm H2O, EPAP min 6cm H2O and PS 5cm H2O with a Small size Philips Respironics Full Face Mask Amara View mask and heated humidification. - Avoid alcohol, sedatives and other CNS depressants that may worsen sleep apnea and disrupt normal sleep architecture. - Sleep hygiene should be reviewed to assess factors that may improve sleep quality. - Weight management and regular exercise should be initiated or continued. - Return to Sleep Center for re-evaluation in 8 weeks.  [Electronically signed] 01/31/2020 09:38 PM  Fransico Him MD, ABSM Diplomate, American Board of Sleep Medicine

## 2020-01-31 NOTE — Progress Notes (Signed)
Report given to PACU, vss 

## 2020-01-31 NOTE — Op Note (Signed)
[facility] Patient Name: Emma Stephens Procedure Date: 01/31/2020 9:10 AM MRN: [id number] Endoscopist: Justice Britain , MD Age: 49 Referring MD:  Date of Birth: 1971-02-24 Gender: Female Account #: [id number] Procedure:                Colonoscopy Indications:              Screening for colorectal malignant neoplasm, This                            is the patient's first colonoscopy Medicines:                Monitored Anesthesia Care Procedure:                Pre-Anesthesia Assessment:                           - Prior to the procedure, a History and Physical                            was performed, and patient medications and                            allergies were reviewed. The patient's tolerance of                            previous anesthesia was also reviewed. The risks                            and benefits of the procedure and the sedation                            options and risks were discussed with the patient.                            All questions were answered, and informed consent                            was obtained. Prior Anticoagulants: The patient has                            taken no previous anticoagulant or antiplatelet                            agents. ASA Grade Assessment: II - A patient with                            mild systemic disease. After reviewing the risks                            and benefits, the patient was deemed in                            satisfactory condition to undergo the procedure.  After obtaining informed consent, the colonoscope                            was passed under direct vision. Throughout the                            procedure, the patient's blood pressure, pulse, and                            oxygen saturations were monitored continuously. The                            Colonoscope was introduced through the anus and                            advanced to the 5  cm into the ileum. The                            colonoscopy was performed without difficulty. The                            patient tolerated the procedure. The quality of the                            bowel preparation was adequate. The terminal ileum,                            ileocecal valve, appendiceal orifice, and rectum                            were photographed. Scope In: 9:21:29 AM Scope Out: 9:49:52 AM Scope Withdrawal Time: 0 hours 25 minutes 59 seconds  Total Procedure Duration: 0 hours 28 minutes 23 seconds  Findings:                 Skin tags were found on perianal exam.                           The digital rectal exam findings include                            hemorrhoids. Pertinent negatives include no                            palpable rectal lesions.                           The terminal ileum and ileocecal valve appeared                            normal.                           Three sessile and semi-sessile polyps were found in  the sigmoid colon. The polyps were 3 to 12 mm in                            size. These polyps were removed with a cold snare.                            Resection and retrieval were complete.                           A 20 mm polyp was found in the rectosigmoid/sigmoid                            colon (@ 21 cm). The polyp was pedunculated.                            Preparations were made for mucosal resection. NBI                            imaging and White-light endoscopy was done to                            demarcate the borders of the lesion. Saline was                            injected to raise the lesion. Snare mucosal                            resection was performed. Resection and retrieval                            were complete. To prevent bleeding after mucosal                            resection, two hemostatic clips were successfully                            placed (MR  conditional). There was no bleeding                            during, or at the end, of the procedure.                           Multiple small-mouthed diverticula were found in                            the recto-sigmoid colon, sigmoid colon and                            descending colon.                           Normal mucosa was found in the entire colon  otherwise.                           Non-bleeding non-thrombosed external and internal                            hemorrhoids were found during retroflexion, during                            perianal exam and during digital exam. The                            hemorrhoids were Grade II (internal hemorrhoids                            that prolapse but reduce spontaneously). Complications:            No immediate complications. Estimated Blood Loss:     Estimated blood loss was minimal. Impression:               - Perianal skin tags found on perianal exam.                           - Hemorrhoids found on digital rectal exam.                           - The examined portion of the ileum was normal.                           - Three 3 to 12 mm polyps in the sigmoid colon,                            removed with a cold snare. Resected and retrieved.                           - One 20 mm polyp in the sigmoid colon, removed                            with mucosal resection. Resected and retrieved.                            Clips (MR conditional) were placed.                           - Diverticulosis in the recto-sigmoid colon, in the                            sigmoid colon and in the descending colon.                           - Normal mucosa in the entire examined colon                            otherwise.                           -  Non-bleeding non-thrombosed external and internal                            hemorrhoids. Recommendation:           - The patient will be observed post-procedure,                             until all discharge criteria are met.                           - Discharge patient to home.                           - Patient has a contact number available for                            emergencies. The signs and symptoms of potential                            delayed complications were discussed with the                            patient. Return to normal activities tomorrow.                            Written discharge instructions were provided to the                            patient.                           - High fiber diet.                           - Use FiberCon 1-2 tablets PO daily.                           - Continue present medications.                           - No aspirin, ibuprofen, naproxen, or other                            non-steroidal anti-inflammatory drugs for 2 weeks.                           - Await pathology results.                           - Repeat colonoscopy in likely 3 years for                            surveillance based on pathology results.                           - The findings and recommendations were discussed  with the patient.                           - The findings and recommendations were discussed                            with the patient's family. Justice Britain, MD 01/31/2020 9:58:05 AM

## 2020-01-31 NOTE — Patient Instructions (Addendum)
Impression/Recommendations:  Polyp, hemorrhoid, Diverticulosis, and High fiber diet handouts given to patient. Clip card given to pt.  Instructions reviewed.   High fiber diet. Use FiberCon 1-2 tablets by mouth daily. Continue present medications..  No aspirin, ibuprofen, naproxen, or other NSAID drugs for 2 weeks.  Await pathology results.  Repeat colonoscopy.  Date to be determined after pathology results reviewed.  YOU HAD AN ENDOSCOPIC PROCEDURE TODAY AT [facility]:   Refer to the procedure report that was given to you for any specific questions about what was found during the examination.  If the procedure report does not answer your questions, please call your gastroenterologist to clarify.  If you requested that your care partner not be given the details of your procedure findings, then the procedure report has been included in a sealed envelope for you to review at your convenience later.  YOU SHOULD EXPECT: Some feelings of bloating in the abdomen. Passage of more gas than usual.  Walking can help get rid of the air that was put into your GI tract during the procedure and reduce the bloating. If you had a lower endoscopy (such as a colonoscopy or flexible sigmoidoscopy) you may notice spotting of blood in your stool or on the toilet paper. If you underwent a bowel prep for your procedure, you may not have a normal bowel movement for a few days.  Please Note:  You might notice some irritation and congestion in your nose or some drainage.  This is from the oxygen used during your procedure.  There is no need for concern and it should clear up in a day or so.  SYMPTOMS TO REPORT IMMEDIATELY:   Following lower endoscopy (colonoscopy or flexible sigmoidoscopy):  Excessive amounts of blood in the stool  Significant tenderness or worsening of abdominal pains  Swelling of the abdomen that is new, acute  Fever of 100F or higher  For urgent or emergent issues, a  gastroenterologist can be reached at any hour by calling [phone]. Do not use MyChart messaging for urgent concerns.    DIET:  We do recommend a small meal at first, but then you may proceed to your regular diet.  Drink plenty of fluids but you should avoid alcoholic beverages for 24 hours.  ACTIVITY:  You should plan to take it easy for the rest of today and you should NOT DRIVE or use heavy machinery until tomorrow (because of the sedation medicines used during the test).    FOLLOW UP: Our staff will call the number listed on your records 48-72 hours following your procedure to check on you and address any questions or concerns that you may have regarding the information given to you following your procedure. If we do not reach you, we will leave a message.  We will attempt to reach you two times.  During this call, we will ask if you have developed any symptoms of COVID 19. If you develop any symptoms (ie: fever, flu-like symptoms, shortness of breath, cough etc.) before then, please call [phone].  If you test positive for Covid 19 in the 2 weeks post procedure, please call and report this information to [place].    If any biopsies were taken you will be contacted by phone or by letter within the next 1-3 weeks.  Please call us at [phone] if you have not heard about the biopsies in 3 weeks.    SIGNATURES/CONFIDENTIALITY: You and/or your care partner have signed paperwork which will  be entered into your electronic medical record.  These signatures attest to the fact that that the information above on your After Visit Summary has been reviewed and is understood.  Full responsibility of the confidentiality of this discharge information lies with you and/or your care-partner. 

## 2020-01-31 NOTE — Progress Notes (Signed)
Pt. Reports her R. AC feels sore.  No redness noted, some swelling.  Told pt. Her IV had infiltrated at that site, and was discontinued.  Told pt. She could apply warm compresses at home to alleviate discomfort.  Pt. Given warm compress in RR for R. AC site.

## 2020-01-31 NOTE — Progress Notes (Signed)
Called to room to assist during endoscopic procedure.  Patient ID and intended procedure confirmed with present staff. Received instructions for my participation in the procedure from the performing physician.  

## 2020-01-31 NOTE — Progress Notes (Signed)
Pt's states no medical or surgical changes since previsit or office visit.  VS WR

## 2020-02-01 ENCOUNTER — Encounter: Payer: Self-pay | Admitting: Physician Assistant

## 2020-02-01 ENCOUNTER — Telehealth: Payer: Self-pay | Admitting: *Deleted

## 2020-02-01 MED ORDER — ALPRAZOLAM 0.25 MG PO TABS: 0.2500 mg | ORAL_TABLET | Freq: Two times a day (BID) | ORAL | 1 refills | Status: DC

## 2020-02-01 NOTE — Telephone Encounter (Signed)
Informed patient of sleep study results and patient understanding was verbalized. Patient understands her sleep study showed she has severe OSA and will be placed on auto BIPAP. Needs followpu with me in 8 weeks    Upon patient request DME selection is [facility]. Patient understands she/he will be contacted by [facility] to set up her/he cpap. Patient understands to call if  [facility] does not contact her/he with new setup in a timely manner. Patient understands they will be called once confirmation has been received from [facility] that they have received their new machine to schedule 10 week follow up appointment.   [facility] notified of new cpap order  Please add to airview Patient was grateful for the call and thanked me.

## 2020-02-01 NOTE — Telephone Encounter (Signed)
She will need to discuss with her Gastroenterologist that performed the procedure. She can send them a mychart message or give them a call.

## 2020-02-01 NOTE — Telephone Encounter (Signed)
-----   Message from Sueanne Margarita, MD sent at 01/31/2020  9:43 PM EST ----- Please let patient know that she has severe OSA and will be placed on auto BIPAP.  Needs followpu with me in 8 weeks

## 2020-02-04 ENCOUNTER — Telehealth: Payer: Self-pay | Admitting: *Deleted

## 2020-02-04 NOTE — Telephone Encounter (Signed)
  Follow up Call-  Call back number 01/31/2020  Post procedure Call Back phone  # [phone]  Permission to leave phone message Yes  Some recent data might be hidden     Patient questions:  Do you have a fever, pain , or abdominal swelling? No. Pain Score  0 *  Have you tolerated food without any problems? Yes.    Have you been able to return to your normal activities? Yes.    Do you have any questions about your discharge instructions: Diet   No. Medications  No. Follow up visit  No.  Do you have questions or concerns about your Care? No.  Actions: * If pain score is 4 or above: 1. No action needed, pain <4.Have you developed a fever since your procedure? no  2.   Have you had an respiratory symptoms (SOB or cough) since your procedure? no  3.   Have you tested positive for COVID 19 since your procedure no  4.   Have you had any family members/close contacts diagnosed with the COVID 19 since your procedure?  no   If yes to any of these questions please route to Joylene John, RN and Joella Prince, RN

## 2020-02-06 ENCOUNTER — Encounter: Payer: Self-pay | Admitting: Gastroenterology

## 2020-02-06 ENCOUNTER — Telehealth: Payer: Self-pay | Admitting: Gastroenterology

## 2020-02-06 NOTE — Telephone Encounter (Signed)
Pathology has not returned.  The pt is aware we will contact her by phone letter or both when resulted.  The pt has been advised of the information and verbalized understanding.

## 2020-03-06 ENCOUNTER — Telehealth: Payer: Self-pay | Admitting: Physician Assistant

## 2020-03-06 NOTE — Telephone Encounter (Signed)
Pt called in stating that she has been on her cycle for over 10 days she is having heavy bleeding and clots. She does feel very tired and thinks her iron levels could be low. She has put in a call to her GYN for this as well  But wanted to let Einar Pheasant know what was going on. She is aware that Einar Pheasant has already left for the day and will not be back in the office until tomorrow.   Please advise   Pt can be reached at the home #

## 2020-03-06 NOTE — Telephone Encounter (Signed)
Called patient with PCP recommendations. Patient voiced understanding. She states she is still waiting to hear back from GYN. I informed patient to let us know when they get her scheduled.

## 2020-03-06 NOTE — Telephone Encounter (Signed)
If anything worsens before she gets in with GYN, would recommend ER evaluation -- severe menstrual bleeding and symptomatic anemia. Make sure she is taking iron supplement

## 2020-03-26 ENCOUNTER — Other Ambulatory Visit: Payer: Self-pay | Admitting: Physician Assistant

## 2020-03-26 ENCOUNTER — Encounter: Payer: Self-pay | Admitting: Physician Assistant

## 2020-03-26 NOTE — Telephone Encounter (Signed)
Can you please send in a refill for my alprazolam.  Thank you    LFD 02/01/20 #30 with 1 refill LOV 11/15/19 NOV none

## 2020-03-26 NOTE — Telephone Encounter (Signed)
Duplicate, unable to refuse.

## 2020-05-01 ENCOUNTER — Other Ambulatory Visit: Payer: Self-pay | Admitting: Internal Medicine

## 2020-05-08 ENCOUNTER — Other Ambulatory Visit: Payer: Self-pay | Admitting: Physician Assistant

## 2020-05-09 ENCOUNTER — Encounter: Payer: Self-pay | Admitting: Physician Assistant

## 2020-05-09 DIAGNOSIS — F411 Generalized anxiety disorder: Secondary | ICD-10-CM

## 2020-05-09 MED ORDER — ALPRAZOLAM 0.25 MG PO TABS
0.2500 mg | ORAL_TABLET | Freq: Two times a day (BID) | ORAL | 1 refills | Status: DC
Start: 2020-05-09 — End: 2020-06-18

## 2020-05-09 NOTE — Telephone Encounter (Signed)
Xanax last rx 03/26/20 #30 1 RF LOV: 11/15/19 Lymphadenopathy

## 2020-05-23 ENCOUNTER — Other Ambulatory Visit: Payer: Self-pay | Admitting: Family

## 2020-05-23 ENCOUNTER — Other Ambulatory Visit: Payer: Self-pay | Admitting: Physician Assistant

## 2020-05-30 ENCOUNTER — Other Ambulatory Visit: Payer: Self-pay | Admitting: Internal Medicine

## 2020-05-30 ENCOUNTER — Other Ambulatory Visit: Payer: Self-pay | Admitting: Physician Assistant

## 2020-06-16 ENCOUNTER — Ambulatory Visit: Payer: BC Managed Care – PPO | Admitting: Nurse Practitioner

## 2020-06-17 ENCOUNTER — Encounter: Payer: Self-pay | Admitting: Physician Assistant

## 2020-06-18 ENCOUNTER — Other Ambulatory Visit: Payer: Self-pay | Admitting: Family

## 2020-06-18 DIAGNOSIS — F411 Generalized anxiety disorder: Secondary | ICD-10-CM

## 2020-06-18 MED ORDER — ALPRAZOLAM 0.25 MG PO TABS: 0.2500 mg | ORAL_TABLET | Freq: Two times a day (BID) | ORAL | 1 refills | Status: DC

## 2020-07-14 ENCOUNTER — Other Ambulatory Visit: Payer: Self-pay

## 2020-07-14 ENCOUNTER — Ambulatory Visit: Payer: BC Managed Care – PPO | Admitting: Nurse Practitioner

## 2020-08-08 ENCOUNTER — Other Ambulatory Visit: Payer: Self-pay

## 2020-08-08 ENCOUNTER — Encounter: Payer: Self-pay | Admitting: Family Medicine

## 2020-08-08 ENCOUNTER — Ambulatory Visit: Payer: BC Managed Care – PPO | Admitting: Family Medicine

## 2020-08-08 VITALS — BP 140/86 | HR 72 | Temp 97.3°F | Ht 61.75 in | Wt 164.2 lb

## 2020-08-08 DIAGNOSIS — F411 Generalized anxiety disorder: Secondary | ICD-10-CM | POA: Diagnosis not present

## 2020-08-08 DIAGNOSIS — Z0000 Encounter for general adult medical examination without abnormal findings: Secondary | ICD-10-CM

## 2020-08-08 DIAGNOSIS — I10 Essential (primary) hypertension: Secondary | ICD-10-CM | POA: Diagnosis not present

## 2020-08-08 DIAGNOSIS — K219 Gastro-esophageal reflux disease without esophagitis: Secondary | ICD-10-CM

## 2020-08-08 DIAGNOSIS — E611 Iron deficiency: Secondary | ICD-10-CM | POA: Insufficient documentation

## 2020-08-08 DIAGNOSIS — D649 Anemia, unspecified: Secondary | ICD-10-CM | POA: Diagnosis not present

## 2020-08-08 DIAGNOSIS — F17213 Nicotine dependence, cigarettes, with withdrawal: Secondary | ICD-10-CM

## 2020-08-08 DIAGNOSIS — F341 Dysthymic disorder: Secondary | ICD-10-CM | POA: Insufficient documentation

## 2020-08-08 LAB — URINALYSIS, ROUTINE W REFLEX MICROSCOPIC
Bilirubin Urine: NEGATIVE
Hgb urine dipstick: NEGATIVE
Ketones, ur: NEGATIVE
Leukocytes,Ua: NEGATIVE
Nitrite: NEGATIVE
Specific Gravity, Urine: >=1.03 — AB (ref 1.000–1.030)
Total Protein, Urine: NEGATIVE
Urine Glucose: NEGATIVE
Urobilinogen, UA: 0.2 (ref 0.0–1.0)
pH: 5.5 (ref 5.0–8.0)

## 2020-08-08 LAB — CBC
HCT: 41.8 % (ref 36.0–46.0)
Hemoglobin: 14.1 g/dL (ref 12.0–15.0)
MCHC: 33.8 g/dL (ref 30.0–36.0)
MCV: 88.9 fl (ref 78.0–100.0)
Platelets: 326 10*3/uL (ref 150.0–400.0)
RBC: 4.7 Mil/uL (ref 3.87–5.11)
RDW: 13.7 % (ref 11.5–15.5)
WBC: 7.2 10*3/uL (ref 4.0–10.5)

## 2020-08-08 LAB — COMPREHENSIVE METABOLIC PANEL
ALT: 11 U/L (ref 0–35)
AST: 14 U/L (ref 0–37)
Albumin: 4.9 g/dL (ref 3.5–5.2)
Alkaline Phosphatase: 79 U/L (ref 39–117)
BUN: 11 mg/dL (ref 6–23)
CO2: 28 mEq/L (ref 19–32)
Calcium: 9.9 mg/dL (ref 8.4–10.5)
Chloride: 101 mEq/L (ref 96–112)
Creatinine, Ser: 0.63 mg/dL (ref 0.40–1.20)
GFR: 102.76 mL/min (ref >60.00)
Glucose, Bld: 81 mg/dL (ref 70–99)
Potassium: 4.4 mEq/L (ref 3.5–5.1)
Sodium: 139 mEq/L (ref 135–145)
Total Bilirubin: 0.5 mg/dL (ref 0.2–1.2)
Total Protein: 7.5 g/dL (ref 6.0–8.3)

## 2020-08-08 LAB — LIPID PANEL
Cholesterol: 187 mg/dL (ref 0–200)
HDL: 46.9 mg/dL (ref >39.00)
LDL Cholesterol: 120 mg/dL — ABNORMAL HIGH (ref 0–99)
NonHDL: 139.67
Total CHOL/HDL Ratio: 4
Triglycerides: 97 mg/dL (ref 0.0–149.0)
VLDL: 19.4 mg/dL (ref 0.0–40.0)

## 2020-08-08 LAB — TSH: TSH: 0.69 u[IU]/mL (ref 0.35–4.50)

## 2020-08-08 MED ORDER — PANTOPRAZOLE SODIUM 40 MG PO TBEC: 1.0000 | DELAYED_RELEASE_TABLET | Freq: Every day | ORAL | 5 refills | Status: DC

## 2020-08-08 MED ORDER — ESCITALOPRAM OXALATE 10 MG PO TABS: 10.0000 mg | ORAL_TABLET | Freq: Every day | ORAL | 1 refills | Status: DC

## 2020-08-08 MED ORDER — DILTIAZEM HCL ER COATED BEADS 240 MG PO CP24: ORAL_CAPSULE | ORAL | 2 refills | Status: DC

## 2020-08-08 NOTE — Patient Instructions (Addendum)
Health Maintenance, Female Adopting a healthy lifestyle and getting preventive care are important in promoting health and wellness. Ask your health care provider about:  The right schedule for you to have regular tests and exams.  Things you can do on your own to prevent diseases and keep yourself healthy. What should I know about diet, weight, and exercise? Eat a healthy diet  Eat a diet that includes plenty of vegetables, fruits, low-fat dairy products, and lean protein.  Do not eat a lot of foods that are high in solid fats, added sugars, or sodium.   Maintain a healthy weight Body mass index (BMI) is used to identify weight problems. It estimates body fat based on height and weight. Your health care provider can help determine your BMI and help you achieve or maintain a healthy weight. Get regular exercise Get regular exercise. This is one of the most important things you can do for your health. Most adults should:  Exercise for at least 150 minutes each week. The exercise should increase your heart rate and make you sweat (moderate-intensity exercise).  Do strengthening exercises at least twice a week. This is in addition to the moderate-intensity exercise.  Spend less time sitting. Even light physical activity can be beneficial. Watch cholesterol and blood lipids Have your blood tested for lipids and cholesterol at 51 years of age, then have this test every 5 years. Have your cholesterol levels checked more often if:  Your lipid or cholesterol levels are high.  You are older than 51 years of age.  You are at high risk for heart disease. What should I know about cancer screening? Depending on your health history and family history, you may need to have cancer screening at various ages. This may include screening for:  Breast cancer.  Cervical cancer.  Colorectal cancer.  Skin cancer.  Lung cancer. What should I know about heart disease, diabetes, and high blood  pressure? Blood pressure and heart disease  High blood pressure causes heart disease and increases the risk of stroke. This is more likely to develop in people who have high blood pressure readings, are of African descent, or are overweight.  Have your blood pressure checked: ? Every 3-5 years if you are 59-59 years of age. ? Every year if you are 63 years old or older. Diabetes Have regular diabetes screenings. This checks your fasting blood sugar level. Have the screening done:  Once every three years after age 58 if you are at a normal weight and have a low risk for diabetes.  More often and at a younger age if you are overweight or have a high risk for diabetes. What should I know about preventing infection? Hepatitis B If you have a higher risk for hepatitis B, you should be screened for this virus. Talk with your health care provider to find out if you are at risk for hepatitis B infection. Hepatitis C Testing is recommended for:  Everyone born from [birth year] through 1966.  Anyone with known risk factors for hepatitis C. Sexually transmitted infections (STIs)  Get screened for STIs, including gonorrhea and chlamydia, if: ? You are sexually active and are younger than 51 years of age. ? You are older than 51 years of age and your health care provider tells you that you are at risk for this type of infection. ? Your sexual activity has changed since you were last screened, and you are at increased risk for chlamydia or gonorrhea. Ask your health care provider  if you are at risk.  Ask your health care provider about whether you are at high risk for HIV. Your health care provider may recommend a prescription medicine to help prevent HIV infection. If you choose to take medicine to prevent HIV, you should first get tested for HIV. You should then be tested every 3 months for as long as you are taking the medicine. Pregnancy  If you are about to stop having your period (premenopausal) and  you may become pregnant, seek counseling before you get pregnant.  Take 400 to 800 micrograms (mcg) of folic acid every day if you become pregnant.  Ask for birth control (contraception) if you want to prevent pregnancy. Osteoporosis and menopause Osteoporosis is a disease in which the bones lose minerals and strength with aging. This can result in bone fractures. If you are 65 years old or older, or if you are at risk for osteoporosis and fractures, ask your health care provider if you should:  Be screened for bone loss.  Take a calcium or vitamin D supplement to lower your risk of fractures.  Be given hormone replacement therapy (HRT) to treat symptoms of menopause. Follow these instructions at home: Lifestyle  Do not use any products that contain nicotine or tobacco, such as cigarettes, e-cigarettes, and chewing tobacco. If you need help quitting, ask your health care provider.  Do not use street drugs.  Do not share needles.  Ask your health care provider for help if you need support or information about quitting drugs. Alcohol use  Do not drink alcohol if: ? Your health care provider tells you not to drink. ? You are pregnant, may be pregnant, or are planning to become pregnant.  If you drink alcohol: ? Limit how much you use to 0-1 drink a day. ? Limit intake if you are breastfeeding.  Be aware of how much alcohol is in your drink. In the [place] one drink equals one 12 oz bottle of beer (355 mL), one 5 oz glass of wine (148 mL), or one 1 oz glass of hard liquor (44 mL). General instructions  Schedule regular health, dental, and eye exams.  Stay current with your vaccines.  Tell your health care provider if: ? You often feel depressed. ? You have ever been abused or do not feel safe at home. Summary  Adopting a healthy lifestyle and getting preventive care are important in promoting health and wellness.  Follow your health care provider's instructions about healthy  diet, exercising, and getting tested or screened for diseases.  Follow your health care provider's instructions on monitoring your cholesterol and blood pressure. This information is not intended to replace advice given to you by your health care provider. Make sure you discuss any questions you have with your health care provider. Document Revised: 03/01/2018 Document Reviewed: 03/01/2018 Elsevier Patient Education  2021 Else[employer] Inc.  Preventive Care 40-64 Years Old, Female Preventive care refers to lifestyle choices and visits with your health care provider that can promote health and wellness. This includes:  A yearly physical exam. This is also called an annual wellness visit.  Regular dental and eye exams.  Immunizations.  Screening for certain conditions.  Healthy lifestyle choices, such as: ? Eating a healthy diet. ? Getting regular exercise. ? Not using drugs or products that contain nicotine and tobacco. ? Limiting alcohol use. What can I expect for my preventive care visit? Physical exam Your health care provider will check your:  Height and weight. These may   be used to calculate your BMI (body mass index). BMI is a measurement that tells if you are at a healthy weight.  Heart rate and blood pressure.  Body temperature.  Skin for abnormal spots. Counseling Your health care provider may ask you questions about your:  Past medical problems.  Family's medical history.  Alcohol, tobacco, and drug use.  Emotional well-being.  Home life and relationship well-being.  Sexual activity.  Diet, exercise, and sleep habits.  Work and work Statistician.  Access to firearms.  Method of birth control.  Menstrual cycle.  Pregnancy history. What immunizations do I need? Vaccines are usually given at various ages, according to a schedule. Your health care provider will recommend vaccines for you based on your age, medical history, and lifestyle or other factors,  such as travel or where you work.   What tests do I need? Blood tests  Lipid and cholesterol levels. These may be checked every 5 years, or more often if you are over 65 years old.  Hepatitis C test.  Hepatitis B test. Screening  Lung cancer screening. You may have this screening every year starting at age 90 if you have a 30-pack-year history of smoking and currently smoke or have quit within the past 15 years.  Colorectal cancer screening. ? All adults should have this screening starting at age 16 and continuing until age 38. ? Your health care provider may recommend screening at age 30 if you are at increased risk. ? You will have tests every 1-10 years, depending on your results and the type of screening test.  Diabetes screening. ? This is done by checking your blood sugar (glucose) after you have not eaten for a while (fasting). ? You may have this done every 1-3 years.  Mammogram. ? This may be done every 1-2 years. ? Talk with your health care provider about when you should start having regular mammograms. This may depend on whether you have a family history of breast cancer.  BRCA-related cancer screening. This may be done if you have a family history of breast, ovarian, tubal, or peritoneal cancers.  Pelvic exam and Pap test. ? This may be done every 3 years starting at age 58. ? Starting at age 60, this may be done every 5 years if you have a Pap test in combination with an HPV test. Other tests  STD (sexually transmitted disease) testing, if you are at risk.  Bone density scan. This is done to screen for osteoporosis. You may have this scan if you are at high risk for osteoporosis. Talk with your health care provider about your test results, treatment options, and if necessary, the need for more tests. Follow these instructions at home: Eating and drinking  Eat a diet that includes fresh fruits and vegetables, whole grains, lean protein, and low-fat dairy  products.  Take vitamin and mineral supplements as recommended by your health care provider.  Do not drink alcohol if: ? Your health care provider tells you not to drink. ? You are pregnant, may be pregnant, or are planning to become pregnant.  If you drink alcohol: ? Limit how much you have to 0-1 drink a day. ? Be aware of how much alcohol is in your drink. In the [place] one drink equals one 12 oz bottle of beer (355 mL), one 5 oz glass of wine (148 mL), or one 1 oz glass of hard liquor (44 mL).   Lifestyle  Take daily care of your teeth and  gums. Brush your teeth every morning and night with fluoride toothpaste. Floss one time each day.  Stay active. Exercise for at least 30 minutes 5 or more days each week.  Do not use any products that contain nicotine or tobacco, such as cigarettes, e-cigarettes, and chewing tobacco. If you need help quitting, ask your health care provider.  Do not use drugs.  If you are sexually active, practice safe sex. Use a condom or other form of protection to prevent STIs (sexually transmitted infections).  If you do not wish to become pregnant, use a form of birth control. If you plan to become pregnant, see your health care provider for a prepregnancy visit.  If told by your health care provider, take low-dose aspirin daily starting at age 61.  Find healthy ways to cope with stress, such as: ? Meditation, yoga, or listening to music. ? Journaling. ? Talking to a trusted person. ? Spending time with friends and family. Safety  Always wear your seat belt while driving or riding in a vehicle.  Do not drive: ? If you have been drinking alcohol. Do not ride with someone who has been drinking. ? When you are tired or distracted. ? While texting.  Wear a helmet and other protective equipment during sports activities.  If you have firearms in your house, make sure you follow all gun safety procedures. What's next?  Visit your health care provider  once a year for an annual wellness visit.  Ask your health care provider how often you should have your eyes and teeth checked.  Stay up to date on all vaccines. This information is not intended to replace advice given to you by your health care provider. Make sure you discuss any questions you have with your health care provider. Document Revised: 12/11/2019 Document Reviewed: 11/17/2017 Elsevier Patient Education  2021 [employer].

## 2020-08-08 NOTE — Progress Notes (Signed)
Established Patient Office Visit  Subjective:  Patient ID: Emma Stephens, female    DOB: April 15, 1970  Age: 50 y.o. MRN: [id number]  CC: No chief complaint on file.   HPI Emma Stephens presents for establishment care by way of transfer.  Significant past medical history of hypertension with A. fib.  A. fib has been paroxysmal and has not been present for some time now.  She had taken Eliquis in the past but that is been discontinued per cardiology.  She had taken Xanax when she felt palpitations.  She no longer feels these palpitations unless she forgets to take her medicines.  History of iron deficiency and tobacco use.  She has tried Chantix in the past with some success.  She is taking iron twice daily.  Female check back in January.  She reports abdominal wall cramps when she bends over or sits down to toilet.  She is here with her son today.  Past Medical History:  Diagnosis Date  . Anemia   . Anxiety   . Blood transfusion without reported diagnosis   . Cigarette nicotine dependence   . GERD (gastroesophageal reflux disease)   . Gout 2011  . Hypertension   . PAF (paroxysmal atrial fibrillation) ([place])   . PVC (premature ventricular contraction)   . Sleep apnea    not on cpap at this time 01-17-20    Past Surgical History:  Procedure Laterality Date  . NECK SURGERY    . TUBAL LIGATION      Family History  Problem Relation Age of Onset  . Diabetes Mother   . Hypertension Mother   . Cancer Father 11       oral  . Diabetes Sister   . Hypertension Sister   . Diabetes Brother   . Hypertension Brother   . Breast cancer Paternal Grandmother   . Sudden Cardiac Death Neg Hx   . Heart attack Neg Hx   . Colon cancer Neg Hx   . Esophageal cancer Neg Hx   . Rectal cancer Neg Hx   . Stomach cancer Neg Hx     Social History   Socioeconomic History  . Marital status: Married    Spouse name: Not on file  . Number of children: Not on file  . Years of education: Not  on file  . Highest education level: Not on file  Occupational History  . Occupation: Stage manager: [employer]  Tobacco Use  . Smoking status: Current Every Day Smoker    Packs/day: 0.50    Years: 31.00    Pack years: 15.50    Types: Cigarettes  . Smokeless tobacco: Never Used  Vaping Use  . Vaping Use: Never used  Substance and Sexual Activity  . Alcohol use: No    Alcohol/week: 0.0 standard drinks  . Drug use: No  . Sexual activity: Yes    Partners: Male  Other Topics Concern  . Not on file  Social History Narrative   Lives in [place] with family.   Social Determinants of Health   Financial Resource Strain: Not on file  Food Insecurity: Not on file  Transportation Needs: Not on file  Physical Activity: Not on file  Stress: Not on file  Social Connections: Not on file  Intimate Partner Violence: Not on file    Outpatient Medications Prior to Visit  Medication Sig Dispense Refill  . ALPRAZolam (XANAX) 0.25 MG tablet Take 1 tablet (0.25 mg total) by mouth 2 (two) times daily.  30 tablet 1  . cholecalciferol (VITAMIN D3) 25 MCG (1000 UT) tablet Take 1,000 Units by mouth daily.    . Cyanocobalamin (VITAMIN B-12 PO) Take 1 tablet by mouth daily. Take 2 3000 mg tablets daily    . ferrous sulfate 325 (65 FE) MG tablet TAKE 1 TABLET BY MOUTH 2 TIMES DAILY WITH A MEAL. 180 tablet 1  . metoprolol succinate (TOPROL-XL) 25 MG 24 hr tablet TAKE 1 TABLET BY MOUTH EVERY DAY 90 tablet 1  . spironolactone (ALDACTONE) 25 MG tablet TAKE 1 TABLET BY MOUTH EVERY DAY 90 tablet 0  . diltiazem (CARDIZEM CD) 240 MG 24 hr capsule TAKE 1 CAPSULE BY MOUTH EVERY DAY 90 capsule 3  . pantoprazole (PROTONIX) 40 MG tablet TAKE 1 TABLET BY MOUTH EVERY DAY 90 tablet 1  . nicotine (NICODERM CQ) 14 mg/24hr patch Place 1 patch (14 mg total) onto the skin daily. (Patient not taking: Reported on 01/31/2020) 28 patch 0  . varenicline (CHANTIX STARTING MONTH PAK) 0.5 MG X 11 & 1 MG X 42 tablet Take one 0.5  mg tablet by mouth once daily for 3 days, then increase to one 0.5 mg tablet twice daily for 4 days, then increase to one 1 mg tablet twice daily. (Patient not taking: Reported on 01/31/2020) 53 tablet 0   Facility-Administered Medications Prior to Visit  Medication Dose Route Frequency Provider Last Rate Last Admin  . 0.9 %  sodium chloride infusion  500 mL Intravenous Once Mansouraty, Telford Nab., MD        Allergies  Allergen Reactions  . Lisinopril Hives and Swelling    Angioedema and facial swelling  . Wellbutrin [Bupropion] Itching    ROS Review of Systems  Constitutional: Negative.   HENT: Negative.   Eyes: Negative for photophobia and visual disturbance.  Respiratory: Negative.   Cardiovascular: Negative.   Gastrointestinal: Negative.   Endocrine: Negative for polyphagia and polyuria.  Genitourinary: Negative.   Musculoskeletal: Positive for myalgias.  Skin: Negative for pallor and rash.  Neurological: Negative for weakness.  Psychiatric/Behavioral: Negative.    Depression screen [facility] 2/9 08/08/2020 10/25/2019 06/27/2018  Decreased Interest 1 0 0  Down, Depressed, Hopeless 1 0 0  PHQ - 2 Score 2 0 0  Altered sleeping 1 - 0  Tired, decreased energy 2 - 0  Change in appetite 1 - 0  Feeling bad or failure about yourself  1 - 0  Trouble concentrating 1 - 0  Moving slowly or fidgety/restless 1 - 0  Suicidal thoughts 0 - 0  PHQ-9 Score 9 - 0  Difficult doing work/chores Somewhat difficult - Not difficult at all      Objective:    Physical Exam Vitals and nursing note reviewed.  Constitutional:      General: She is not in acute distress.    Appearance: Normal appearance. She is not ill-appearing, toxic-appearing or diaphoretic.  HENT:     Head: Normocephalic and atraumatic.     Right Ear: Tympanic membrane, ear canal and external ear normal.     Left Ear: Tympanic membrane, ear canal and external ear normal.     Mouth/Throat:     Mouth: Mucous membranes are moist.      Pharynx: Oropharynx is clear. No oropharyngeal exudate or posterior oropharyngeal erythema.  Eyes:     General:        Right eye: No discharge.        Left eye: No discharge.     Extraocular Movements: Extraocular  movements intact.     Conjunctiva/sclera: Conjunctivae normal.     Pupils: Pupils are equal, round, and reactive to light.  Cardiovascular:     Rate and Rhythm: Normal rate and regular rhythm.  Pulmonary:     Effort: Pulmonary effort is normal.     Breath sounds: Normal breath sounds.  Abdominal:     General: Bowel sounds are normal.  Musculoskeletal:     Cervical back: No rigidity or tenderness.  Lymphadenopathy:     Cervical: No cervical adenopathy.  Skin:    General: Skin is warm and dry.  Neurological:     Mental Status: She is alert and oriented to person, place, and time.  Psychiatric:        Mood and Affect: Mood normal.        Behavior: Behavior normal.     BP 140/86 (BP Location: Left Arm, Patient Position: Sitting, Cuff Size: Normal)   Pulse 72   Temp (!) 97.3 F (36.3 C) (Temporal)   Ht 5' 1.75" (1.568 m)   Wt 164 lb 4 oz (74.5 kg)   LMP 07/21/2020 (Approximate)   BMI 30.29 kg/m  Wt Readings from Last 3 Encounters:  08/08/20 164 lb 4 oz (74.5 kg)  01/31/20 163 lb (73.9 kg)  01/24/20 165 lb (74.8 kg)     Health Maintenance Due  Topic Date Due  . Hepatitis C Screening  Never done  . PAP SMEAR-Modifier  03/07/2017  . TETANUS/TDAP  03/22/2018    There are no preventive care reminders to display for this patient.  Lab Results  Component Value Date   TSH 0.953 06/25/2018   Lab Results  Component Value Date   WBC 5.5 11/15/2019   HGB 13.5 11/15/2019   HCT 40.9 11/15/2019   MCV 90.5 11/15/2019   PLT 338.0 11/15/2019   Lab Results  Component Value Date   NA 136 10/25/2019   K 4.7 10/25/2019   CO2 25 10/25/2019   GLUCOSE 79 10/25/2019   BUN 8 10/25/2019   CREATININE 0.69 10/25/2019   BILITOT 0.5 05/30/2019   ALKPHOS 74  05/30/2019   AST 13 05/30/2019   ALT 10 05/30/2019   PROT 7.2 05/30/2019   ALBUMIN 4.4 05/30/2019   CALCIUM 9.4 10/25/2019   ANIONGAP 10 06/25/2018   GFR 108.72 10/25/2019   Lab Results  Component Value Date   CHOL 155 05/30/2019   Lab Results  Component Value Date   HDL 38.80 (L) 05/30/2019   Lab Results  Component Value Date   LDLCALC 98 05/30/2019   Lab Results  Component Value Date   TRIG 93.0 05/30/2019   Lab Results  Component Value Date   CHOLHDL 4 05/30/2019   Lab Results  Component Value Date   HGBA1C 5.8 07/30/2014      Assessment & Plan:   Problem List Items Addressed This Visit      Cardiovascular and Mediastinum   Essential hypertension, benign - Primary   Relevant Medications   diltiazem (CARDIZEM CD) 240 MG 24 hr capsule   Other Relevant Orders   Comprehensive metabolic panel   Urinalysis, Routine w reflex microscopic     Digestive   Gastroesophageal reflux disease without esophagitis   Relevant Medications   pantoprazole (PROTONIX) 40 MG tablet     Other   Healthcare maintenance   Relevant Orders   Comprehensive metabolic panel   Lipid panel   Urinalysis, Routine w reflex microscopic   Cigarette nicotine dependence   Anxiety state  Relevant Medications   escitalopram (LEXAPRO) 10 MG tablet   Other Relevant Orders   TSH   Dysthymia   Relevant Medications   escitalopram (LEXAPRO) 10 MG tablet   Iron deficiency   Relevant Orders   Iron, TIBC and Ferritin Panel   Anemia   Relevant Orders   CBC      Meds ordered this encounter  Medications  . diltiazem (CARDIZEM CD) 240 MG 24 hr capsule    Sig: TAKE 1 CAPSULE BY MOUTH EVERY DAY    Dispense:  30 capsule    Refill:  2  . pantoprazole (PROTONIX) 40 MG tablet    Sig: Take 1 tablet (40 mg total) by mouth daily.    Dispense:  30 tablet    Refill:  5  . escitalopram (LEXAPRO) 10 MG tablet    Sig: Take 1 tablet (10 mg total) by mouth daily.    Dispense:  30 tablet    Refill:   1    Follow-up: Return in about 6 weeks (around 09/19/2020).   Discontinue Xanax.  We will start Lexapro at low-dose.  We will look to increase on follow-up.  Continue diltiazem with metoprolol as directed per cardiology. Libby Maw, MD

## 2020-08-09 LAB — IRON,TIBC AND FERRITIN PANEL
%SAT: 47 % (calc) — ABNORMAL HIGH (ref 16–45)
Ferritin: 88 ng/mL (ref 16–232)
Iron: 153 ug/dL (ref 45–160)
TIBC: 326 mcg/dL (calc) (ref 250–450)

## 2020-08-16 ENCOUNTER — Encounter: Payer: Self-pay | Admitting: Family Medicine

## 2020-09-03 ENCOUNTER — Other Ambulatory Visit: Payer: Self-pay | Admitting: Family Medicine

## 2020-09-03 DIAGNOSIS — F341 Dysthymic disorder: Secondary | ICD-10-CM

## 2020-09-03 DIAGNOSIS — F411 Generalized anxiety disorder: Secondary | ICD-10-CM

## 2020-09-19 ENCOUNTER — Encounter: Payer: Self-pay | Admitting: Family Medicine

## 2020-09-19 ENCOUNTER — Other Ambulatory Visit: Payer: Self-pay

## 2020-09-19 ENCOUNTER — Ambulatory Visit: Payer: BC Managed Care – PPO | Admitting: Family Medicine

## 2020-09-19 ENCOUNTER — Telehealth: Payer: Self-pay | Admitting: Family Medicine

## 2020-09-19 VITALS — BP 138/76 | HR 79 | Temp 98.7°F | Ht 61.75 in

## 2020-09-19 DIAGNOSIS — F411 Generalized anxiety disorder: Secondary | ICD-10-CM | POA: Diagnosis not present

## 2020-09-19 DIAGNOSIS — F341 Dysthymic disorder: Secondary | ICD-10-CM

## 2020-09-19 MED ORDER — ESCITALOPRAM OXALATE 10 MG PO TABS: ORAL_TABLET | ORAL | 1 refills | Status: DC

## 2020-09-19 NOTE — Progress Notes (Signed)
Established Patient Office Visit  Subjective:  Patient ID: Emma Stephens, female    DOB: 05-Nov-1970  Age: 50 y.o. MRN: [id number]  CC:  Chief Complaint  Patient presents with   6 week follow up    HTN, Anxiety (Re-visit medication changes)    HPI Donzella Jacot presents for anxiety, dysthymia, iron deficiency anemia and hypertension ho A fib.  Patient has been advised by cardiology to fu for htn and ho afib but has not made the appointment yet.  She is planning on doing so in the near future.  She is not certain whether or not the Lexapro is helping.  She has said that she is taking it at different times.  She has taken it intermittently.  However she does say that it makes her drowsy.  She stopped it and restarted the xanax. She feels as thought she has had some palpitations at work. Wonders if she needs to continue iron because she still feels like her energy needs a boost and it had seemed to help with that..  She is no longer anemic or microcytic.  Iron levels and stores are adequate.  There were polyps and a recent colonoscopy.  She sees no blood in her urine.  Recent UA confirms.  She is still having menstrual flow monthly but feels as though she may may be perimenopausal.  Recent follow-up with her GYN provider, she tells me.  Past Medical History:  Diagnosis Date   Anemia    Anxiety    Blood transfusion without reported diagnosis    Cigarette nicotine dependence    GERD (gastroesophageal reflux disease)    Gout 2011   Hypertension    PAF (paroxysmal atrial fibrillation) (HCC)    PVC (premature ventricular contraction)    Sleep apnea    not on cpap at this time 01-17-20    Past Surgical History:  Procedure Laterality Date   NECK SURGERY     TUBAL LIGATION      Family History  Problem Relation Age of Onset   Diabetes Mother    Hypertension Mother    Cancer Father 108       oral   Diabetes Sister    Hypertension Sister    Diabetes Brother    Hypertension  Brother    Breast cancer Paternal Grandmother    Sudden Cardiac Death Neg Hx    Heart attack Neg Hx    Colon cancer Neg Hx    Esophageal cancer Neg Hx    Rectal cancer Neg Hx    Stomach cancer Neg Hx     Social History   Socioeconomic History   Marital status: Married    Spouse name: Not on file   Number of children: Not on file   Years of education: Not on file   Highest education level: Not on file  Occupational History   Occupation: Cooking    Employer: [employer]  Tobacco Use   Smoking status: Every Day    Packs/day: 0.50    Years: 31.00    Pack years: 15.50    Types: Cigarettes   Smokeless tobacco: Never  Vaping Use   Vaping Use: Never used  Substance and Sexual Activity   Alcohol use: No    Alcohol/week: 0.0 standard drinks   Drug use: No   Sexual activity: Yes    Partners: Male  Other Topics Concern   Not on file  Social History Narrative   Lives in [place] with family.   Social Determinants  of Health   Financial Resource Strain: Not on file  Food Insecurity: Not on file  Transportation Needs: Not on file  Physical Activity: Not on file  Stress: Not on file  Social Connections: Not on file  Intimate Partner Violence: Not on file    Outpatient Medications Prior to Visit  Medication Sig Dispense Refill   ALPRAZolam (XANAX) 0.25 MG tablet Take 1 tablet (0.25 mg total) by mouth 2 (two) times daily. 30 tablet 1   cholecalciferol (VITAMIN D3) 25 MCG (1000 UT) tablet Take 1,000 Units by mouth daily.     Cyanocobalamin (VITAMIN B-12 PO) Take 1 tablet by mouth daily. Take 2 3000 mg tablets daily     diltiazem (CARDIZEM CD) 240 MG 24 hr capsule TAKE 1 CAPSULE BY MOUTH EVERY DAY 30 capsule 2   metoprolol succinate (TOPROL-XL) 25 MG 24 hr tablet TAKE 1 TABLET BY MOUTH EVERY DAY 90 tablet 1   pantoprazole (PROTONIX) 40 MG tablet Take 1 tablet (40 mg total) by mouth daily. 30 tablet 5   spironolactone (ALDACTONE) 25 MG tablet TAKE 1 TABLET BY MOUTH EVERY DAY 90  tablet 0   ferrous sulfate 325 (65 FE) MG tablet TAKE 1 TABLET BY MOUTH 2 TIMES DAILY WITH A MEAL. (Patient not taking: Reported on 09/19/2020) 180 tablet 1   escitalopram (LEXAPRO) 10 MG tablet TAKE 1 TABLET BY MOUTH EVERY DAY (Patient not taking: Reported on 09/19/2020) 90 tablet 1   Facility-Administered Medications Prior to Visit  Medication Dose Route Frequency Provider Last Rate Last Admin   0.9 %  sodium chloride infusion  500 mL Intravenous Once Mansouraty, Telford Nab., MD        Allergies  Allergen Reactions   Lisinopril Hives and Swelling    Angioedema and facial swelling   Wellbutrin [Bupropion] Itching    ROS Review of Systems  Constitutional: Negative.   HENT: Negative.    Eyes:  Negative for photophobia and visual disturbance.  Respiratory: Negative.    Cardiovascular:  Positive for palpitations.  Gastrointestinal: Negative.   Genitourinary: Negative.   Psychiatric/Behavioral:  The patient is nervous/anxious.      Objective:    Physical Exam Vitals and nursing note reviewed.  Constitutional:      General: She is not in acute distress.    Appearance: Normal appearance. She is not ill-appearing, toxic-appearing or diaphoretic.  HENT:     Head: Normocephalic and atraumatic.     Right Ear: External ear normal.     Left Ear: External ear normal.  Eyes:     General: No scleral icterus.       Right eye: No discharge.        Left eye: No discharge.     Conjunctiva/sclera: Conjunctivae normal.  Cardiovascular:     Rate and Rhythm: Normal rate and regular rhythm.  Pulmonary:     Effort: Pulmonary effort is normal.     Breath sounds: Normal breath sounds.  Neurological:     Mental Status: She is alert and oriented to person, place, and time.  Psychiatric:        Mood and Affect: Mood normal.        Behavior: Behavior normal.    BP 138/76   Pulse 79   Temp 98.7 F (37.1 C)   Ht 5' 1.75" (1.568 m)   SpO2 99%   BMI 30.29 kg/m  Wt Readings from Last 3  Encounters:  08/08/20 164 lb 4 oz (74.5 kg)  01/31/20 163 lb (73.9  kg)  01/24/20 165 lb (74.8 kg)     Health Maintenance Due  Topic Date Due   Pneumococcal Vaccine 58-90 Years old (1 - PCV) Never done   Hepatitis C Screening  Never done   PAP SMEAR-Modifier  03/07/2017   TETANUS/TDAP  03/22/2018   Zoster Vaccines- Shingrix (1 of 2) Never done   COVID-19 Vaccine (4 - Booster for Pfizer series) 09/11/2020    There are no preventive care reminders to display for this patient.  Lab Results  Component Value Date   TSH 0.69 08/08/2020   Lab Results  Component Value Date   WBC 7.2 08/08/2020   HGB 14.1 08/08/2020   HCT 41.8 08/08/2020   MCV 88.9 08/08/2020   PLT 326.0 08/08/2020   Lab Results  Component Value Date   NA 139 08/08/2020   K 4.4 08/08/2020   CO2 28 08/08/2020   GLUCOSE 81 08/08/2020   BUN 11 08/08/2020   CREATININE 0.63 08/08/2020   BILITOT 0.5 08/08/2020   ALKPHOS 79 08/08/2020   AST 14 08/08/2020   ALT 11 08/08/2020   PROT 7.5 08/08/2020   ALBUMIN 4.9 08/08/2020   CALCIUM 9.9 08/08/2020   ANIONGAP 10 06/25/2018   GFR 102.76 08/08/2020   Lab Results  Component Value Date   CHOL 187 08/08/2020   Lab Results  Component Value Date   HDL 46.90 08/08/2020   Lab Results  Component Value Date   LDLCALC 120 (H) 08/08/2020   Lab Results  Component Value Date   TRIG 97.0 08/08/2020   Lab Results  Component Value Date   CHOLHDL 4 08/08/2020   Lab Results  Component Value Date   HGBA1C 5.8 07/30/2014      Assessment & Plan:   Problem List Items Addressed This Visit       Other   Anxiety state - Primary   Relevant Medications   escitalopram (LEXAPRO) 10 MG tablet   Dysthymia   Relevant Medications   escitalopram (LEXAPRO) 10 MG tablet    Meds ordered this encounter  Medications   escitalopram (LEXAPRO) 10 MG tablet    Sig: Take one daily at night.    Dispense:  90 tablet    Refill:  1    Follow-up: Return in about 3 months  (around 12/20/2020).  We discussed changing medicines or increasing the dosage of the Lexapro.  Patient said that she would like to continue it at the lower dose and see how it does.  Discussed possible psychiatric referral.  Urged her to go ahead and follow-up with cardiology for requested appointment.  She will continue her multivitamin with iron.  Said that she could take an iron pill once or twice a week if she feels as though it may help her energy levels.   Libby Maw, MD

## 2020-09-19 NOTE — Telephone Encounter (Signed)
Pt wanted to know if she can transfer care from Dr Ethelene Hal to Wilfred Lacy, She said it has nothing to do with Dr Ethelene Hal but that Baldo Ash was who she wanted to originally see but needed to see someone sooner for medication refills because she didn't want to run out. Is this okay?

## 2020-10-21 NOTE — Telephone Encounter (Signed)
Lvm for pt to call back and schedule.  

## 2020-10-28 ENCOUNTER — Other Ambulatory Visit: Payer: Self-pay | Admitting: Family

## 2020-10-28 ENCOUNTER — Other Ambulatory Visit: Payer: Self-pay | Admitting: Internal Medicine

## 2020-11-06 ENCOUNTER — Other Ambulatory Visit: Payer: Self-pay | Admitting: Family Medicine

## 2020-11-06 DIAGNOSIS — I10 Essential (primary) hypertension: Secondary | ICD-10-CM

## 2020-11-29 ENCOUNTER — Other Ambulatory Visit: Payer: Self-pay | Admitting: Family Medicine

## 2020-11-29 DIAGNOSIS — K219 Gastro-esophageal reflux disease without esophagitis: Secondary | ICD-10-CM

## 2020-12-19 ENCOUNTER — Ambulatory Visit: Payer: BC Managed Care – PPO | Admitting: Internal Medicine

## 2020-12-25 ENCOUNTER — Ambulatory Visit: Payer: BC Managed Care – PPO | Admitting: Internal Medicine

## 2020-12-29 ENCOUNTER — Ambulatory Visit: Payer: BC Managed Care – PPO | Admitting: Family Medicine

## 2021-01-15 ENCOUNTER — Other Ambulatory Visit: Payer: Self-pay | Admitting: Family

## 2021-01-15 ENCOUNTER — Telehealth: Payer: Self-pay | Admitting: Family Medicine

## 2021-01-15 DIAGNOSIS — I10 Essential (primary) hypertension: Secondary | ICD-10-CM

## 2021-01-15 MED ORDER — METOPROLOL SUCCINATE ER 25 MG PO TB24: 25.0000 mg | ORAL_TABLET | Freq: Every day | ORAL | 0 refills | Status: DC

## 2021-01-15 MED ORDER — SPIRONOLACTONE 25 MG PO TABS: 25.0000 mg | ORAL_TABLET | Freq: Every day | ORAL | 0 refills | Status: DC

## 2021-01-15 NOTE — Telephone Encounter (Signed)
Rx sent in today.  This is a duplicate encounter, closing

## 2021-01-31 NOTE — Progress Notes (Signed)
Cardiology Office Note Date:  01/31/2021  Patient ID:  Emma Stephens, Emma Stephens [birth year], MRN [id number] PCP:  Libby Maw, MD  Cardiologist:  Dr. Caryl Comes    Chief Complaint:  over due  History of Present Illness: Emma Stephens is a 50 y.o. female with history of HTN, PVCs,  AFib.  She saw Dr. Caryl Comes in June 2020, she had numerous c/o Palpitations with SOB, exertional CP Planned for ZIO patich and calcium scoring (with h/o neg stress 4 years prior), echo, and sleep study He last saw her Aug 2020, she was doing well.  Mentioned her BP better controlled and her uterine bleeding improved as well. Discussed her CHA2DS2Vasc score of 2 (including gender).  If recurrent palpitations would consider a/c.   She was found with severe sleep apnea planned for titration and CPAP tx Monitor noted Duration:  12d 8h  Findings HR  avg 81  Min 60-Max 127  SVT Nonsustained  1 episodes; fastest 100 bpm for 17 beats;   4 triggered events, one assoc with PVC-isolated; the others assoc with sinus PVCs < 1% PACs < 1%  Echo noted preserved LVEF, no significant VHD Ca++ score was zero  I saw her 04/2019 She is doing well.  She only feels palpitations when she misses her medicines, has now set an alarm for this and with good medicine compliance she has not had any.  No dizzy spells, near syncope or syncope.  No SOB She only feels some chest tightness when at peak exercise when she is very SOB, with slowing this settles quickly as does her breathing. She was waiting on CPAP titration when her insurance had resumed , which it as, but had not yet set this up. She sees her PMD soon, planned for annual labs Her insurance was straightened out and planned to get back on track with OSA management She had no symptoms of Afib Not on a/c with score of 2 (including gender) Encouraged exercise, weight loss  Last note was a video visit with Dr. Radford Pax, discussed non-compliance with her CPAP, requiring  BIPAP though her insurance declined 2/2 noncompliance and she had to return the device. 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 No formal or regular exercise She has become more aware of palpitations and suspects she is having more AFib, where it used to be only when missed doses, now seems more often, though mostly at times of increased personal stress. Episodes are brief, irregular feeling heart beating No near syncope or syncope No rest SOB or CP, no difficulties with ADLS though when she is doing strenuous activities she gets a little heavy in her chest.slowing/resting resolves it quickly  She is not using BIPAP/CPAP or any kind of apnea therapy   AFib Hx Diagnosed April 2020  > had spontaneous conversion to SR No AAD hx to date   Past Medical History:  Diagnosis Date   Anemia    Anxiety    Blood transfusion without reported diagnosis    Cigarette nicotine dependence    GERD (gastroesophageal reflux disease)    Gout 2011   Hypertension    PAF (paroxysmal atrial fibrillation) (HCC)    PVC (premature ventricular contraction)    Sleep apnea    not on cpap at this time 01-17-20    Past Surgical History:  Procedure Laterality Date   NECK SURGERY  TUBAL LIGATION      Current Outpatient Medications  Medication Sig Dispense Refill   ALPRAZolam (XANAX) 0.25 MG tablet Take 1 tablet (0.25 mg total) by mouth 2 (two) times daily. 30 tablet 1   cholecalciferol (VITAMIN D3) 25 MCG (1000 UT) tablet Take 1,000 Units by mouth daily.     Cyanocobalamin (VITAMIN B-12 PO) Take 1 tablet by mouth daily. Take 2 3000 mg tablets daily     diltiazem (CARDIZEM CD) 240 MG 24 hr capsule TAKE 1 CAPSULE BY MOUTH EVERY DAY 90 capsule 0   escitalopram (LEXAPRO) 10 MG tablet Take one daily at night. 90 tablet 1   ferrous sulfate 325 (65 FE) MG tablet TAKE 1 TABLET  BY MOUTH 2 TIMES DAILY WITH A MEAL. (Patient not taking: Reported on 09/19/2020) 180 tablet 1   metoprolol succinate (TOPROL-XL) 25 MG 24 hr tablet Take 1 tablet (25 mg total) by mouth daily. 90 tablet 0   pantoprazole (PROTONIX) 40 MG tablet TAKE 1 TABLET BY MOUTH EVERY DAY 90 tablet 1   spironolactone (ALDACTONE) 25 MG tablet Take 1 tablet (25 mg total) by mouth daily. 90 tablet 0   Current Facility-Administered Medications  Medication Dose Route Frequency Provider Last Rate Last Admin   0.9 %  sodium chloride infusion  500 mL Intravenous Once Mansouraty, Telford Nab., MD        Allergies:   Lisinopril and Wellbutrin [bupropion]   Social History:  The patient  reports that she has been smoking cigarettes. She has a 15.50 pack-year smoking history. She has never used smokeless tobacco. She reports that she does not drink alcohol and does not use drugs.   Family History:  The patient's family history includes Breast cancer in her paternal grandmother; Cancer (age of onset: 39) in her father; Diabetes in her brother, mother, and sister; Hypertension in her brother, mother, and sister.  ROS:  Please see the history of present illness.  All other systems are reviewed and otherwise negative.   PHYSICAL EXAM:  VS:  There were no vitals taken for this visit. BMI: There is no height or weight on file to calculate BMI. Well nourished, well developed, in no acute distress  HEENT: normocephalic, atraumatic  Neck: no JVD, carotid bruits or masses Cardiac:  RRR; no significant murmurs, no rubs, or gallops Lungs:  CTA b/l, no wheezing, rhonchi or rales  Abd: soft, nontender, obese MS: no deformity or atrophy Ext: no edema  Skin: warm and dry, no rash Neuro:  No gross deficits appreciated Psych: euthymic mood, full affect   EKG:  done today and reviewed by myself: SR 85bpm, no changes  Dec 2020 heart monitor Duration:  12d 8h Findings HR  avg 81  Min 60-Max 127  SVT Nonsustained  1 episodes;  fastest 100 bpm for 17 beats;   4 triggered events, one assoc with PVC-isolated; the others assoc with sinus PVCs < 1% PACs < 1%   09/19/2018: Coronary Ca score IMPRESSION: Coronary calcium score of 0.   09/19/2018: TTE IMPRESSIONS   1. The left ventricle has hyperdynamic systolic function, with an  ejection fraction of >65%. The cavity size was normal. Left ventricular  diastolic Doppler parameters are consistent with impaired relaxation. No  evidence of left ventricular regional wall   motion abnormalities.   2. The right ventricle has normal systolic function. The cavity was  normal. There is no increase in right ventricular wall thickness.   3. The aortic root is normal in size  and structure.   Recent Labs: 08/08/2020: ALT 11; BUN 11; Creatinine, Ser 0.63; Hemoglobin 14.1; Platelets 326.0; Potassium 4.4; Sodium 139; TSH 0.69  08/08/2020: Cholesterol 187; HDL 46.90; LDL Cholesterol 120; Total CHOL/HDL Ratio 4; Triglycerides 97.0; VLDL 19.4   CrCl cannot be calculated (Patient's most recent lab result is older than the maximum 21 days allowed.).   Wt Readings from Last 3 Encounters:  08/08/20 164 lb 4 oz (74.5 kg)  01/31/20 163 lb (73.9 kg)  01/24/20 165 lb (74.8 kg)     Other studies reviewed: Additional studies/records reviewed today include: summarized above  ASSESSMENT AND PLAN:  1. PVCs 2. Paroxysmal AFib     No changes in her PMHx, her CHA2DS2Vasc remains 2 for HTN, gender     Not on a/c     Increased palpitations over the year, she inquires about an ablation Does not sound like she is having hours or prolonged episodes We discussed AT LENGTH today that I think her primary driver for her AFib is likely her untreated severe sleep apnea We discussed the importance of apnea treatment not only for Afib but her overall health She is agreeable to restart that process and try BIPAP  If ongoing palpitations I think we should re-monitor her 1st   2. HTN     Increase  he metoprolol to BID   3. OSA     As above  4. Chest pressure Ca++ score zero a couple years ago Plan coronary angio             Disposition: back in 4 mo or so, hopefully she will be using BIPAP and doing better   Current medicines are reviewed at length with the patient today.  The patient did not have any concerns regarding medicines.  Venetia Night, PA-C 01/31/2021 5:01 PM     [facility] [address] [place] [address] [phone] (office)  [phone] (fax)

## 2021-02-03 ENCOUNTER — Other Ambulatory Visit: Payer: Self-pay

## 2021-02-03 ENCOUNTER — Encounter: Payer: Self-pay | Admitting: Physician Assistant

## 2021-02-03 ENCOUNTER — Ambulatory Visit (INDEPENDENT_AMBULATORY_CARE_PROVIDER_SITE_OTHER): Payer: Self-pay | Admitting: Physician Assistant

## 2021-02-03 VITALS — BP 158/80 | HR 85 | Ht 61.0 in | Wt 167.0 lb

## 2021-02-03 DIAGNOSIS — I480 Paroxysmal atrial fibrillation: Secondary | ICD-10-CM

## 2021-02-03 DIAGNOSIS — R079 Chest pain, unspecified: Secondary | ICD-10-CM

## 2021-02-03 DIAGNOSIS — G4730 Sleep apnea, unspecified: Secondary | ICD-10-CM

## 2021-02-03 DIAGNOSIS — I10 Essential (primary) hypertension: Secondary | ICD-10-CM

## 2021-02-03 LAB — BASIC METABOLIC PANEL
BUN/Creatinine Ratio: 16 (ref 9–23)
BUN: 10 mg/dL (ref 6–24)
CO2: 25 mmol/L (ref 20–29)
Calcium: 9.7 mg/dL (ref 8.7–10.2)
Chloride: 100 mmol/L (ref 96–106)
Creatinine, Ser: 0.62 mg/dL (ref 0.57–1.00)
Glucose: 125 mg/dL — ABNORMAL HIGH (ref 70–99)
Potassium: 4.1 mmol/L (ref 3.5–5.2)
Sodium: 138 mmol/L (ref 134–144)
eGFR: 108 mL/min/{1.73_m2} (ref >59)

## 2021-02-03 MED ORDER — METOPROLOL TARTRATE 100 MG PO TABS: 100.0000 mg | ORAL_TABLET | Freq: Once | ORAL | 0 refills | Status: DC

## 2021-02-03 MED ORDER — METOPROLOL SUCCINATE ER 25 MG PO TB24: 25.0000 mg | ORAL_TABLET | Freq: Two times a day (BID) | ORAL | 1 refills | Status: DC

## 2021-02-03 NOTE — Patient Instructions (Addendum)
Medications:  START TAKING METOPROLOL SUCCINATE (TOPROL XL ) 25 MG TWICE A DAY    ON DAY OF PROCEDURE : HOLD  METOPROLOL  25 MG DOSES   AND  ONLY TAKE  METOPROLOL (TARTRATE) 100 MG 2 HOURS PRIOR TO SCHEDULED TIME.  RESUME: NORMAL DOSE  METOPROLOL 25 MG  TWICE A DAY THE FOLLOWING DAY    *If you need a refill on your cardiac medications before your next appointment, please call your pharmacy*   Lab Work:  BMET TODAY ( CT PROCEDURE)   If you have labs (blood work) drawn today and your tests are completely normal, you will receive your results only by: [place] (if you have MyChart) OR A paper copy in the mail If you have any lab test that is abnormal or we need to change your treatment, we will call you to review the results.   Testing/Procedures: Non-Cardiac CT Angiography (CTA), is a special type of CT scan that uses a computer to produce multi-dimensional views of major blood vessels throughout the body. In CT angiography, a contrast material is injected through an IV to help visualize the blood vessels   Your physician has recommended that you have a  home sleep study. This test records several body functions during sleep, including: brain activity, eye movement, oxygen and carbon dioxide blood levels, heart rate and rhythm, breathing rate and rhythm, the flow of air through your mouth and nose, snoring, body muscle movements, and chest and belly movement. YOU WILL BE CONTACTED BY OUR SLEEP COORDINATOR AFTER PRE CERTIFICATION PROCESS THROUGH INSURANCE.    Follow-Up: At [facility], you and your health needs are our priority.  As part of our continuing mission to provide you with exceptional heart care, we have created designated Provider Care Teams.  These Care Teams include your primary Cardiologist (physician) and Advanced Practice Providers (APPs -  Physician Assistants and Nurse Practitioners) who all work together to provide you with the care you need, when you need  it.  We recommend signing up for the patient portal called "MyChart".  Sign up information is provided on this After Visit Summary.  MyChart is used to connect with patients for Virtual Visits (Telemedicine).  Patients are able to view lab/test results, encounter notes, upcoming appointments, etc.  Non-urgent messages can be sent to your provider as well.   To learn more about what you can do with MyChart, go to [email or web address].    Your next appointment:   4 month(s)  The format for your next appointment:   In Person  Provider:   You will see the following Advanced Practice Providers on your designated Care Team:   Tommye Standard, [place]       Other Instructions     Your cardiac CT will be scheduled at one of the below locations:   [facility] [address] [place], [place] [address] [phone]     If scheduled at [facility], please arrive at the [facility] main entrance (entrance A) of [facility] 30 minutes prior to test start time. You can use the FREE valet parking offered at the main entrance (encouraged to control the heart rate for the test) Proceed to the [facility] Radiology Department (first floor) to check-in and test prep.  If scheduled at [facility], please arrive 15 mins early for check-in and test prep.  Please follow these instructions carefully (unless otherwise directed):    On the Night Before the Test:  Be sure to Drink plenty of water. Do not consume any caffeinated/decaffeinated beverages or chocolate 12 hours prior to your test. Do not take any antihistamines 12 hours prior to your test.  On the Day of the Test: Drink plenty of water until 1 hour prior to the test. Do not eat any food 4 hours prior to the test. You may take your regular medications prior to the test.  Take metoprolol (Lopressor) two hours prior to test. HOLD Furosemide/Hydrochlorothiazide morning of the  test. FEMALES- please wear underwire-free bra if available, avoid dresses & tight clothing        After the Test: Drink plenty of water. After receiving IV contrast, you may experience a mild flushed feeling. This is normal. On occasion, you may experience a mild rash up to 24 hours after the test. This is not dangerous. If this occurs, you can take Benadryl 25 mg and increase your fluid intake. If you experience trouble breathing, this can be serious. If it is severe call 911 IMMEDIATELY. If it is mild, please call our office. If you take any of these medications: Glipizide/Metformin, Avandament, Glucavance, please do not take 48 hours after completing test unless otherwise instructed.  Please allow 2-4 weeks for scheduling of routine cardiac CTs. Some insurance companies require a pre-authorization which may delay scheduling of this test.   For non-scheduling related questions, please contact the cardiac imaging nurse navigator should you have any questions/concerns: Marchia Bond, Cardiac Imaging Nurse Navigator Gordy Clement, Cardiac Imaging Nurse Navigator [facility] Direct Office Dial: [phone]   For scheduling needs, including cancellations and rescheduling, please call [place], [phone].

## 2021-02-04 ENCOUNTER — Other Ambulatory Visit (HOSPITAL_BASED_OUTPATIENT_CLINIC_OR_DEPARTMENT_OTHER): Payer: Self-pay | Admitting: Family Medicine

## 2021-02-04 DIAGNOSIS — Z1231 Encounter for screening mammogram for malignant neoplasm of breast: Secondary | ICD-10-CM

## 2021-02-09 ENCOUNTER — Other Ambulatory Visit: Payer: Self-pay | Admitting: Family Medicine

## 2021-02-09 DIAGNOSIS — I10 Essential (primary) hypertension: Secondary | ICD-10-CM

## 2021-02-09 NOTE — Telephone Encounter (Signed)
Chart supports rx refill Last ov: 08/08/2021 Last refill: 11/06/2020

## 2021-02-13 ENCOUNTER — Telehealth (HOSPITAL_COMMUNITY): Payer: Self-pay | Admitting: Emergency Medicine

## 2021-02-13 NOTE — Telephone Encounter (Signed)
Attempted to call patient regarding upcoming cardiac CT appointment. °Left message on voicemail with name and callback number °Feleshia Zundel RN Navigator Cardiac Imaging °[facility] °[phone] Office °[phone] Cell ° °

## 2021-02-16 ENCOUNTER — Telehealth (HOSPITAL_COMMUNITY): Payer: Self-pay | Admitting: Emergency Medicine

## 2021-02-16 ENCOUNTER — Ambulatory Visit (HOSPITAL_COMMUNITY): Admission: RE | Admit: 2021-02-16 | Payer: Self-pay | Source: Ambulatory Visit

## 2021-02-16 NOTE — Telephone Encounter (Signed)
Pt calling to report shes battling a cold, took some nyquil last night and [facility] brand cold/allergy med today. Asking if she should r/s her CCTA.  I suggested that we should r/s her appt because her resting HR was 85 before taking those meds. She now mounting an immune response to a virus/cold and HR will be even harder to control for our test.  New appt made for 02/26/21 at 10:00am  Marchia Bond RN Navigator Cardiac Imaging [facility] [phone] Office  [phone] Cell

## 2021-02-24 ENCOUNTER — Telehealth (HOSPITAL_COMMUNITY): Payer: Self-pay | Admitting: Emergency Medicine

## 2021-02-24 NOTE — Telephone Encounter (Signed)
Attempted to call patient regarding upcoming cardiac CT appointment. °Left message on voicemail with name and callback number °Ahtziri Jeffries RN Navigator Cardiac Imaging °[facility] °[phone] Office °[phone] Cell ° °

## 2021-02-26 ENCOUNTER — Ambulatory Visit (HOSPITAL_COMMUNITY)
Admission: RE | Admit: 2021-02-26 | Discharge: 2021-02-26 | Disposition: A | Discharge status: Home / Self Care | Payer: Self-pay | Source: Ambulatory Visit | Attending: Physician Assistant | Admitting: Physician Assistant

## 2021-02-26 ENCOUNTER — Other Ambulatory Visit: Payer: Self-pay

## 2021-02-26 ENCOUNTER — Encounter (HOSPITAL_COMMUNITY): Payer: Self-pay

## 2021-02-26 DIAGNOSIS — R079 Chest pain, unspecified: Secondary | ICD-10-CM | POA: Insufficient documentation

## 2021-02-26 DIAGNOSIS — I700 Atherosclerosis of aorta: Secondary | ICD-10-CM

## 2021-02-26 MED ORDER — IOHEXOL 350 MG/ML SOLN
95.0000 mL | Freq: Once | INTRAVENOUS | Status: AC | PRN
  Administered 2021-02-26: 95 mL via INTRAVENOUS

## 2021-02-26 MED ORDER — NITROGLYCERIN 0.4 MG SL SUBL
0.8000 mg | SUBLINGUAL_TABLET | SUBLINGUAL | Status: DC | PRN
  Administered 2021-02-26: 0.8 mg via SUBLINGUAL

## 2021-02-26 MED ORDER — METOPROLOL TARTRATE 5 MG/5ML IV SOLN
10.0000 mg | INTRAVENOUS | Status: DC | PRN
  Administered 2021-02-26: 10 mg via INTRAVENOUS

## 2021-02-26 MED ORDER — DILTIAZEM HCL 25 MG/5ML IV SOLN
INTRAVENOUS | Status: AC
  Administered 2021-02-26: 10 mg via INTRAVENOUS
  Filled 2021-02-26: qty 5

## 2021-02-26 MED ORDER — NITROGLYCERIN 0.4 MG SL SUBL
SUBLINGUAL_TABLET | SUBLINGUAL | Status: AC
  Filled 2021-02-26: qty 2

## 2021-02-26 MED ORDER — METOPROLOL TARTRATE 5 MG/5ML IV SOLN
INTRAVENOUS | Status: AC
  Filled 2021-02-26: qty 20

## 2021-02-26 MED ORDER — DILTIAZEM HCL 25 MG/5ML IV SOLN: 10.0000 mg | Freq: Once | INTRAVENOUS | Status: AC

## 2021-03-04 ENCOUNTER — Telehealth (HOSPITAL_BASED_OUTPATIENT_CLINIC_OR_DEPARTMENT_OTHER): Payer: Self-pay

## 2021-03-09 ENCOUNTER — Telehealth: Payer: Self-pay

## 2021-03-09 ENCOUNTER — Encounter (HOSPITAL_BASED_OUTPATIENT_CLINIC_OR_DEPARTMENT_OTHER): Payer: Self-pay

## 2021-03-09 DIAGNOSIS — Z1231 Encounter for screening mammogram for malignant neoplasm of breast: Secondary | ICD-10-CM

## 2021-03-09 NOTE — Telephone Encounter (Signed)
Letter has been sent to patient instructing them to call us if they are still interested in completing their sleep study. If we have not received a response from the patient within 30 days of this notice, the order will be cancelled and they will need to discuss the need for a sleep study at their next office visit.  ° °

## 2021-03-18 ENCOUNTER — Other Ambulatory Visit: Payer: Self-pay | Admitting: Family Medicine

## 2021-03-18 DIAGNOSIS — I10 Essential (primary) hypertension: Secondary | ICD-10-CM

## 2021-03-31 ENCOUNTER — Encounter (HOSPITAL_BASED_OUTPATIENT_CLINIC_OR_DEPARTMENT_OTHER): Payer: Self-pay

## 2021-03-31 ENCOUNTER — Other Ambulatory Visit: Payer: Self-pay

## 2021-03-31 ENCOUNTER — Ambulatory Visit (HOSPITAL_BASED_OUTPATIENT_CLINIC_OR_DEPARTMENT_OTHER)
Admission: RE | Admit: 2021-03-31 | Discharge: 2021-03-31 | Disposition: A | Discharge status: Home / Self Care | Payer: Self-pay | Source: Ambulatory Visit | Attending: Family Medicine | Admitting: Family Medicine

## 2021-03-31 DIAGNOSIS — Z1231 Encounter for screening mammogram for malignant neoplasm of breast: Secondary | ICD-10-CM | POA: Insufficient documentation

## 2021-04-02 ENCOUNTER — Other Ambulatory Visit: Payer: Self-pay | Admitting: Family Medicine

## 2021-04-02 DIAGNOSIS — R928 Other abnormal and inconclusive findings on diagnostic imaging of breast: Secondary | ICD-10-CM

## 2021-04-21 ENCOUNTER — Encounter: Payer: Self-pay | Admitting: Nurse Practitioner

## 2021-04-21 ENCOUNTER — Ambulatory Visit
Admission: RE | Admit: 2021-04-21 | Discharge: 2021-04-21 | Disposition: A | Discharge status: Home / Self Care | Payer: Self-pay | Source: Ambulatory Visit | Attending: Family Medicine | Admitting: Family Medicine

## 2021-04-21 ENCOUNTER — Other Ambulatory Visit: Payer: Self-pay

## 2021-04-21 ENCOUNTER — Ambulatory Visit: Admission: RE | Admit: 2021-04-21 | Payer: Self-pay | Source: Ambulatory Visit

## 2021-04-21 ENCOUNTER — Ambulatory Visit (INDEPENDENT_AMBULATORY_CARE_PROVIDER_SITE_OTHER): Payer: Self-pay | Admitting: Nurse Practitioner

## 2021-04-21 VITALS — BP 150/90 | HR 84 | Temp 98.0°F | Ht 61.75 in | Wt 164.4 lb

## 2021-04-21 DIAGNOSIS — F17213 Nicotine dependence, cigarettes, with withdrawal: Secondary | ICD-10-CM

## 2021-04-21 DIAGNOSIS — R928 Other abnormal and inconclusive findings on diagnostic imaging of breast: Secondary | ICD-10-CM

## 2021-04-21 DIAGNOSIS — F418 Other specified anxiety disorders: Secondary | ICD-10-CM

## 2021-04-21 DIAGNOSIS — D500 Iron deficiency anemia secondary to blood loss (chronic): Secondary | ICD-10-CM

## 2021-04-21 DIAGNOSIS — Z716 Tobacco abuse counseling: Secondary | ICD-10-CM

## 2021-04-21 LAB — BASIC METABOLIC PANEL
BUN: 12 mg/dL (ref 6–23)
CO2: 26 mEq/L (ref 19–32)
Calcium: 9.7 mg/dL (ref 8.4–10.5)
Chloride: 103 mEq/L (ref 96–112)
Creatinine, Ser: 0.67 mg/dL (ref 0.40–1.20)
GFR: 100.75 mL/min (ref >60.00)
Glucose, Bld: 82 mg/dL (ref 70–99)
Potassium: 4.5 mEq/L (ref 3.5–5.1)
Sodium: 136 mEq/L (ref 135–145)

## 2021-04-21 MED ORDER — CHANTIX STARTING MONTH PAK 0.5 MG X 11 & 1 MG X 42 PO TBPK: ORAL_TABLET | ORAL | 0 refills | Status: AC

## 2021-04-21 NOTE — Assessment & Plan Note (Signed)
Secondary to menstrual cycle. Has monthly cycles.LMP:04/04/21, 03/04/2022 Vaginal bleeding 5-7days, no clots Associated with dysmenorrhea PMS symptoms: bloating, breast tenderness Up to date with PAP 2021, normal per patient. Requested records from [place]. Last appt with GYN 2021 Up to date with colonoscopy: 01/2020 repeat in 3years Reports increase fatigue with discontinuation iron supplement.  Repeat iron panel Advised about her risk of cancer and DVT/PE if hormonal therapy is used to control hormonal fluctuation. Advised to quit tobacco use Advised to schedule f/up appt with GYN

## 2021-04-21 NOTE — Patient Instructions (Signed)
Go to lab for blood draw  Varenicline oral tablets What is this medication? VARENICLINE (var e NI kleen) is used to help people quit smoking. It is used with a patient support program recommended by your physician. This medicine may be used for other purposes; ask your health care provider or pharmacist if you have questions. COMMON BRAND NAME(S): Chantix What should I tell my care team before I take this medication? They need to know if you have any of these conditions: heart disease if you often drink alcohol kidney disease mental illness on hemodialysis seizures history of stroke suicidal thoughts, plans, or attempt; a previous suicide attempt by you or a family member an unusual or allergic reaction to varenicline, other medicines, foods, dyes, or preservatives pregnant or trying to get pregnant breast-feeding How should I use this medication? Take this medicine by mouth after eating. Take with a full glass of water. Follow the directions on the prescription label. Take your doses at regular intervals. Do not take your medicine more often than directed. There are 3 ways you can use this medicine to help you quit smoking; talk to your health care professional to decide which plan is right for you: 1) you can choose a quit date and start this medicine 1 week before the quit date, or, 2) you can start taking this medicine before you choose a quit date, and then pick a quit date between day 8 and 35 days of treatment, or, 3) if you are not sure that you are able or willing to quit smoking right away, start taking this medicine and slowly decrease the amount you smoke as directed by your health care professional with the goal of being cigarette-free by week 12 of treatment. Stick to your plan; ask about support groups or other ways to help you remain cigarette-free. If you are motivated to quit smoking and did not succeed during a previous attempt with this medicine for reasons other than  side effects, or if you returned to smoking after this treatment, speak with your health care professional about whether another course of this medicine may be right for you. A special MedGuide will be given to you by the pharmacist with each prescription and refill. Be sure to read this information carefully each time. Talk to your pediatrician regarding the use of this medicine in children. This medicine is not approved for use in children. Overdosage: If you think you have taken too much of this medicine contact a poison control center or emergency room at once. NOTE: This medicine is only for you. Do not share this medicine with others. What if I miss a dose? If you miss a dose, take it as soon as you can. If it is almost time for your next dose, take only that dose. Do not take double or extra doses. What may interact with this medication? alcohol insulin other medicines used to help people quit smoking theophylline warfarin This list may not describe all possible interactions. Give your health care provider a list of all the medicines, herbs, non-prescription drugs, or dietary supplements you use. Also tell them if you smoke, drink alcohol, or use illegal drugs. Some items may interact with your medicine. What should I watch for while using this medication? It is okay if you do not succeed at your attempt to quit and have a cigarette. You can still continue your quit attempt and keep using this medicine as directed. Just throw away your cigarettes and get back to your  quit plan. Talk to your health care provider before using other treatments to quit smoking. Using this medicine with other treatments to quit smoking may increase the risk for side effects compared to using a treatment alone. You may get drowsy or dizzy. Do not drive, use machinery, or do anything that needs mental alertness until you know how this medicine affects you. Do not stand or sit up quickly, especially if you are an  older patient. This reduces the risk of dizzy or fainting spells. Decrease the number of alcoholic beverages that you drink during treatment with this medicine until you know if this medicine affects your ability to tolerate alcohol. Some people have experienced increased drunkenness (intoxication), unusual or sometimes aggressive behavior, or no memory of things that have happened (amnesia) during treatment with this medicine. Sleepwalking can happen during treatment with this medicine, and can sometimes lead to behavior that is harmful to you, other people, or property. Stop taking this medicine and tell your doctor if you start sleepwalking or have other unusual sleep-related activity. After taking this medicine, you may get up out of bed and do an activity that you do not know you are doing. The next morning, you may have no memory of this. Activities include driving a car ("sleep-driving"), making and eating food, talking on the phone, sexual activity, and sleep-walking. Serious injuries have occurred. Stop the medicine and call your doctor right away if you find out you have done any of these activities. Do not take this medicine if you have used alcohol that evening. Do not take it if you have taken another medicine for sleep. The risk of doing these sleep-related activities is higher. Patients and their families should watch out for new or worsening depression or thoughts of suicide. Also watch out for sudden changes in feelings such as feeling anxious, agitated, panicky, irritable, hostile, aggressive, impulsive, severely restless, overly excited and hyperactive, or not being able to sleep. If this happens, call your health care professional. If you have diabetes and you quit smoking, the effects of insulin may be increased and you may need to reduce your insulin dose. Check with your doctor or health care professional about how you should adjust your insulin dose. What side effects may I notice from  receiving this medication? Side effects that you should report to your doctor or health care professional as soon as possible: allergic reactions like skin rash, itching or hives, swelling of the face, lips, tongue, or throat acting aggressive, being angry or violent, or acting on dangerous impulses breathing problems changes in emotions or moods chest pain or chest tightness feeling faint or lightheaded, falls hallucination, loss of contact with reality mouth sores redness, blistering, peeling or loosening of the skin, including inside the mouth signs and symptoms of a stroke like changes in vision; confusion; trouble speaking or understanding; severe headaches; sudden numbness or weakness of the face, arm or leg; trouble walking; dizziness; loss of balance or coordination seizures sleepwalking suicidal thoughts or other mood changes Side effects that usually do not require medical attention (report to your doctor or health care professional if they continue or are bothersome): constipation gas headache nausea, vomiting strange dreams trouble sleeping This list may not describe all possible side effects. Call your doctor for medical advice about side effects. You may report side effects to FDA at 1-800-FDA-1088. Where should I keep my medication? Keep out of the reach of children. Store at room temperature between 15 and 30 degrees C (59  and 86 degrees F). Throw away any unused medicine after the expiration date. NOTE: This sheet is a summary. It may not cover all possible information. If you have questions about this medicine, talk to your doctor, pharmacist, or health care provider.  2022 Elsevier/Gold Standard (2018-02-24 00:00:00)

## 2021-04-21 NOTE — Assessment & Plan Note (Signed)
We talked about the negative impacts of nicotine use: cancer, cardio/cerebro/peripheral vascular disease, hyperlidemia, uncontrolled HTN and DVT/PE. Never quit in past, but was able to decrease amount with use of chantix samples. She resumed smoking more because she ran out of samples. She agreed to resume chantix.  chantix sent with titration instruction. Printed information provided F/up in 17month

## 2021-04-21 NOTE — Assessment & Plan Note (Addendum)
Alprazolam 0.25mg  initially by Stoney Bang in 2021 due to situational anxiety triggered by paroxysmal A-fib. Reports her symptoms have improved and she has not used alprazolam since 08/2020. Also states she does not nee lexapro prescription. Lexapro caused fatigue and daytime somnolence.

## 2021-04-21 NOTE — Progress Notes (Signed)
Subjective:  Patient ID: Emma Stephens, female    DOB: [birth year]  Age: 51 y.o. MRN: [id number]  CC: Establish Care (TOC-Dr. Kremer/Pt would like to discuss anxiety and mood. )  HPI  Situational anxiety Alprazolam 0.25mg  initially by Emma Stephens in 2021 due to situational anxiety triggered by paroxysmal A-fib. Reports her symptoms have improved and she has not used alprazolam since 08/2020. Also states she does not nee lexapro prescription. Lexapro caused fatigue and daytime somnolence.   Iron deficiency anemia Secondary to menstrual cycle. Has monthly cycles.LMP:04/04/21, 03/04/2022 Vaginal bleeding 5-7days, no clots Associated with dysmenorrhea PMS symptoms: bloating, breast tenderness Up to date with PAP 2021, normal per patient. Requested records from [place]. Last appt with GYN 2021 Up to date with colonoscopy: 01/2020 repeat in 3years Reports increase fatigue with discontinuation iron supplement.  Repeat iron panel Advised about her risk of cancer and DVT/PE if hormonal therapy is used to control hormonal fluctuation. Advised to quit tobacco use Advised to schedule f/up appt with GYN  Cigarette nicotine dependence We talked about the negative impacts of nicotine use: cancer, cardio/cerebro/peripheral vascular disease, hyperlidemia, uncontrolled HTN and DVT/PE. Never quit in past, but was able to decrease amount with use of chantix samples. She resumed smoking more because she ran out of samples. She agreed to resume chantix.  chantix sent with titration instruction. Printed information provided F/up in 50month  Depression screen [facility] 2/9 04/21/2021 08/08/2020 10/25/2019  Decreased Interest 1 1 0  Down, Depressed, Hopeless 1 1 0  PHQ - 2 Score 2 2 0  Altered sleeping 0 1 -  Tired, decreased energy 2 2 -  Change in appetite 1 1 -  Feeling bad or failure about yourself  1 1 -  Trouble concentrating 1 1 -  Moving slowly or fidgety/restless 0 1 -  Suicidal  thoughts 0 0 -  PHQ-9 Score 7 9 -  Difficult doing work/chores Somewhat difficult Somewhat difficult -    GAD 7 : Generalized Anxiety Score 04/21/2021  Nervous, Anxious, on Edge 1  Control/stop worrying 1  Worry too much - different things 2  Trouble relaxing 2  Restless 1  Easily annoyed or irritable 1  Afraid - awful might happen 1  Total GAD 7 Score 9  Anxiety Difficulty Somewhat difficult   Reviewed past Medical, Social and Family history today.  Outpatient Medications Prior to Visit  Medication Sig Dispense Refill   cholecalciferol (VITAMIN D3) 25 MCG (1000 UT) tablet Take 1,000 Units by mouth daily.     Cyanocobalamin (VITAMIN B-12 PO) Take 1 tablet by mouth daily. Take 2 3000 mg tablets daily     diltiazem (CARDIZEM CD) 240 MG 24 hr capsule TAKE 1 CAPSULE BY MOUTH EVERY DAY 90 capsule 0   metoprolol succinate (TOPROL-XL) 25 MG 24 hr tablet TAKE 1 TABLET (25 MG TOTAL) BY MOUTH DAILY. 90 tablet 0   pantoprazole (PROTONIX) 40 MG tablet TAKE 1 TABLET BY MOUTH EVERY DAY 90 tablet 1   spironolactone (ALDACTONE) 25 MG tablet TAKE 1 TABLET (25 MG TOTAL) BY MOUTH DAILY. 90 tablet 0   ALPRAZolam (XANAX) 0.25 MG tablet Take 1 tablet (0.25 mg total) by mouth 2 (two) times daily. 30 tablet 1   Facility-Administered Medications Prior to Visit  Medication Dose Route Frequency Provider Last Rate Last Admin   0.9 %  sodium chloride infusion  500 mL Intravenous Once Emma Stephens, Emma Nab., MD       ROS See HPI  Objective:  BP (!) 150/90 (BP Location: Left Arm, Cuff Size: Normal)    Pulse 84    Temp 98 F (36.7 C) (Temporal)    Ht 5' 1.75" (1.568 m)    Wt 164 lb 6.4 oz (74.6 kg)    SpO2 98%    BMI 30.31 kg/m   Physical Exam Cardiovascular:     Rate and Rhythm: Normal rate and regular rhythm.     Pulses: Normal pulses.     Heart sounds: Normal heart sounds.  Pulmonary:     Effort: Pulmonary effort is normal.     Breath sounds: Normal breath sounds.  Musculoskeletal:     Right lower  leg: No edema.     Left lower leg: No edema.  Neurological:     Mental Status: She is alert and oriented to person, place, and time.  Psychiatric:        Attention and Perception: Attention normal.        Mood and Affect: Affect normal. Mood is anxious.        Speech: Speech normal.        Behavior: Behavior is cooperative.        Thought Content: Thought content normal.        Cognition and Memory: Cognition and memory normal.    Assessment & Plan:  This visit occurred during the SARS-CoV-2 public health emergency.  Safety protocols were in place, including screening questions prior to the visit, additional usage of staff PPE, and extensive cleaning of exam room while observing appropriate contact time as indicated for disinfecting solutions.   [name] was seen today for establish care.  Diagnoses and all orders for this visit:  Situational anxiety  Encounter for tobacco use cessation counseling -     Varenicline Tartrate, Starter, (CHANTIX STARTING MONTH PAK) 0.5 MG X 11 & 1 MG X 42 TBPK; Take 0.5 mg by mouth daily at 12 noon for 3 days, THEN 0.5 mg 2 (two) times daily for 4 days, THEN 1 mg 2 (two) times daily.  Iron deficiency anemia due to chronic blood loss -     Basic metabolic panel -     Iron, TIBC and Ferritin Panel  Cigarette nicotine dependence with withdrawal -     Varenicline Tartrate, Starter, (CHANTIX STARTING MONTH PAK) 0.5 MG X 11 & 1 MG X 42 TBPK; Take 0.5 mg by mouth daily at 12 noon for 3 days, THEN 0.5 mg 2 (two) times daily for 4 days, THEN 1 mg 2 (two) times daily.    Problem List Items Addressed This Visit       Other   Cigarette nicotine dependence    We talked about the negative impacts of nicotine use: cancer, cardio/cerebro/peripheral vascular disease, hyperlidemia, uncontrolled HTN and DVT/PE. Never quit in past, but was able to decrease amount with use of chantix samples. She resumed smoking more because she ran out of samples. She agreed to  resume chantix.  chantix sent with titration instruction. Printed information provided F/up in 64month      Relevant Medications   Varenicline Tartrate, Starter, (CHANTIX STARTING MONTH PAK) 0.5 MG X 11 & 1 MG X 42 TBPK   Iron deficiency anemia    Secondary to menstrual cycle. Has monthly cycles.LMP:04/04/21, 03/04/2022 Vaginal bleeding 5-7days, no clots Associated with dysmenorrhea PMS symptoms: bloating, breast tenderness Up to date with PAP 2021, normal per patient. Requested records from [place]. Last appt with GYN 2021 Up to date with colonoscopy: 01/2020 repeat  in 3years Reports increase fatigue with discontinuation iron supplement.  Repeat iron panel Advised about her risk of cancer and DVT/PE if hormonal therapy is used to control hormonal fluctuation. Advised to quit tobacco use Advised to schedule f/up appt with GYN      Relevant Orders   Basic metabolic panel   Iron, TIBC and Ferritin Panel   Situational anxiety - Primary    Alprazolam 0.25mg  initially by Emma Stephens in 2021 due to situational anxiety triggered by paroxysmal A-fib. Reports her symptoms have improved and she has not used alprazolam since 08/2020. Also states she does not nee lexapro prescription. Lexapro caused fatigue and daytime somnolence.       Other Visit Diagnoses     Encounter for tobacco use cessation counseling       Relevant Medications   Varenicline Tartrate, Starter, (CHANTIX STARTING MONTH PAK) 0.5 MG X 11 & 1 MG X 42 TBPK       Follow-up: Return in about 4 weeks (around 05/19/2021) for tobacco cessation.  Wilfred Lacy, NP

## 2021-04-22 LAB — IRON,TIBC AND FERRITIN PANEL
%SAT: 19 % (calc) (ref 16–45)
Ferritin: 17 ng/mL (ref 16–232)
Iron: 69 ug/dL (ref 45–160)
TIBC: 362 mcg/dL (calc) (ref 250–450)

## 2021-05-04 ENCOUNTER — Telehealth: Payer: Self-pay | Admitting: Nurse Practitioner

## 2021-05-04 NOTE — Telephone Encounter (Signed)
Pt requesting chantix be called in for the 1mg  so she can get her discount (self pay).

## 2021-05-06 NOTE — Telephone Encounter (Signed)
LVM for patient to return call for clarification.  Does Rx need to be sent in as 1 mg dose?

## 2021-05-11 ENCOUNTER — Other Ambulatory Visit: Payer: Self-pay | Admitting: Family Medicine

## 2021-05-11 DIAGNOSIS — I10 Essential (primary) hypertension: Secondary | ICD-10-CM

## 2021-05-11 DIAGNOSIS — K219 Gastro-esophageal reflux disease without esophagitis: Secondary | ICD-10-CM

## 2021-06-02 NOTE — Progress Notes (Deleted)
? ?Cardiology Office Note ?Date:  06/02/2021  ?Patient ID:  Emma Stephens, DOB 09-30-1970, MRN [id number] ?PCP:  Flossie Buffy, NP  ?Cardiologist:  Dr. Caryl Comes ? ?  ?Chief Complaint:  *** planned f/u ? ?History of Present Illness: ?Emma Stephens is a 51 y.o. female with history of HTN, PVCs,  AFib. ? ?She saw Dr. Caryl Comes in June 2020, she had numerous c/o ?Palpitations with SOB, exertional CP ?Planned for ZIO patich and calcium scoring (with h/o neg stress 4 years prior), echo, and sleep study ?He last saw her Aug 2020, she was doing well.  Mentioned her BP better controlled and her uterine bleeding improved as well. ?Discussed her CHA2DS2Vasc score of 2 (including gender).  If recurrent palpitations would consider a/c. ? ? ?She was found with severe sleep apnea planned for titration and CPAP tx ? ?Monitor noted ?Duration:  12d 8h ? Findings ?HR  avg 81  Min 60-Max 127  ?SVT Nonsustained  1 episodes; fastest 100 bpm for 17 beats;   ?4 triggered events, one assoc with PVC-isolated; the others assoc with sinus ?PVCs < 1% ?PACs < 1% ? ?Echo noted preserved LVEF, no significant VHD ?Ca++ score was zero ? ?I saw her 04/2019 ?She is doing well.  She only feels palpitations when she misses her medicines, has now set an alarm for this and with good medicine compliance she has not had any.  No dizzy spells, near syncope or syncope.  No SOB ?She only feels some chest tightness when at peak exercise when she is very SOB, with slowing this settles quickly as does her breathing. ?She was waiting on CPAP titration when her insurance had resumed , which it as, but had not yet set this up. ?She sees her PMD soon, planned for annual labs ?Her insurance was straightened out and planned to get back on track with OSA management ?She had no symptoms of Afib ?Not on a/c with score of 2 (including gender) ?Encouraged exercise, weight loss ? ?Last note was a video visit with Dr. Radford Pax, discussed non-compliance with her CPAP,  requiring BIPAP though her insurance declined 2/2 noncompliance and she had to return the device. ?Planned to restart with sleep study ? 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 ?No formal or regular exercise ?She has become more aware of palpitations and suspects she is having more AFib, where it used to be only when missed doses, now seems more often, though mostly at times of increased personal stress. ?Episodes are brief, irregular feeling heart beating ?No near syncope or syncope ?No rest SOB or CP, no difficulties with ADLS though when she is doing strenuous activities she gets a little heavy in her chest.slowing/resting resolves it quickly ?She is not using BIPAP/CPAP or any kind of apnea therapy ?Urged to get back on track and discussed importance of treatment of her apnea ?Suspected her palpitations would improved with treatment of her apnea ?Coronary CTa planned ? ?Looks like office staff had reached out about sleep study/titration though I don't see that got completed ? ?Coronary CT without CAD ? ?*** BIPAP at HS? ?*** palps, symptoms ?*** BP ? ? ?AFib Hx ?Diagnosed April 2020  > had spontaneous conversion to SR ?No AAD hx to date ? ? ?Past Medical History:  ?Diagnosis Date  ? Anemia   ? Anxiety   ? Blood transfusion without reported diagnosis   ? Cigarette nicotine  dependence   ? GERD (gastroesophageal reflux disease)   ? Gout 2011  ? Hypertension   ? PAF (paroxysmal atrial fibrillation) ([place])   ? PVC (premature ventricular contraction)   ? Sleep apnea   ? not on cpap at this time 01-17-20  ? ? ?Past Surgical History:  ?Procedure Laterality Date  ? NECK SURGERY    ? TUBAL LIGATION    ? ? ?Current Outpatient Medications  ?Medication Sig Dispense Refill  ? cholecalciferol (VITAMIN D3) 25 MCG (1000 UT) tablet Take 1,000 Units by mouth daily.    ? Cyanocobalamin (VITAMIN B-12 PO) Take 1 tablet by  mouth daily. Take 2 3000 mg tablets daily    ? diltiazem (CARDIZEM CD) 240 MG 24 hr capsule TAKE 1 CAPSULE BY MOUTH EVERY DAY 90 capsule 0  ? metoprolol succinate (TOPROL-XL) 25 MG 24 hr tablet TAKE 1 TABLET (25 MG TOTAL) BY MOUTH DAILY. 90 tablet 0  ? pantoprazole (PROTONIX) 40 MG tablet TAKE 1 TABLET BY MOUTH EVERY DAY 90 tablet 0  ? spironolactone (ALDACTONE) 25 MG tablet TAKE 1 TABLET (25 MG TOTAL) BY MOUTH DAILY. 90 tablet 0  ? Varenicline Tartrate, Starter, (CHANTIX STARTING MONTH PAK) 0.5 MG X 11 & 1 MG X 42 TBPK Take 0.5 mg by mouth daily at 12 noon for 3 days, THEN 0.5 mg 2 (two) times daily for 4 days, THEN 1 mg 2 (two) times daily. 53 each 0  ? ?Current Facility-Administered Medications  ?Medication Dose Route Frequency Provider Last Rate Last Admin  ? 0.9 %  sodium chloride infusion  500 mL Intravenous Once Mansouraty, Telford Nab., MD      ? ? ?Allergies:   Lisinopril and Bupropion  ? ?Social History:  The patient  reports that she has been smoking cigarettes. She has a 15.50 pack-year smoking history. She has never used smokeless tobacco. She reports that she does not drink alcohol and does not use drugs.  ? ?Family History:  The patient's family history includes Breast cancer in her paternal grandmother; Cancer (age of onset: 80) in her father; Diabetes in her brother, mother, and sister; Hypertension in her brother, mother, and sister. ? ?ROS:  Please see the history of present illness.  ?All other systems are reviewed and otherwise negative.  ? ?PHYSICAL EXAM:  ?VS:  There were no vitals taken for this visit. BMI: There is no height or weight on file to calculate BMI. ?Well nourished, well developed, in no acute distress  ?HEENT: normocephalic, atraumatic  ?Neck: no JVD, carotid bruits or masses ?Cardiac:  *** RRR; no significant murmurs, no rubs, or gallops ?Lungs:  *** CTA b/l, no wheezing, rhonchi or rales  ?Abd: soft, nontender, obese ?MS: no deformity or atrophy ?Ext: *** no edema  ?Skin: warm  and dry, no rash ?Neuro:  No gross deficits appreciated ?Psych: euthymic mood, full affect ? ? ?EKG:  not done today ? ?02/26/21: Coronary CTa ?IMPRESSION: ?1. No evidence of CAD, CADRADS = 0. ?2. Coronary calcium score of 0. This was 0 percentile for age and ?sex matched control. ?3. Normal coronary origin with right dominance. ?4.  Diffuse aortic atherosclerosis in descending aorta. ?ADDENDUM: ?Did not transfer in report, 2 large diagonal branches and 2 normal ?OM branches without significant plaque or stenosis. ? ?Dec 2020 heart monitor ?Duration:  12d 8h ?Findings ?HR  avg 81  Min 60-Max 127  ?SVT Nonsustained  1 episodes; fastest 100 bpm for 17 beats;   ?4 triggered events, one assoc with  PVC-isolated; the others assoc with sinus ?PVCs < 1% ?PACs < 1% ? ? ?09/19/2018: Coronary Ca score ?IMPRESSION: ?Coronary calcium score of 0. ? ? ?09/19/2018: TTE ?IMPRESSIONS  ? 1. The left ventricle has hyperdynamic systolic function, with an  ?ejection fraction of >65%. The cavity size was normal. Left ventricular  ?diastolic Doppler parameters are consistent with impaired relaxation. No  ?evidence of left ventricular regional wall  ? motion abnormalities.  ? 2. The right ventricle has normal systolic function. The cavity was  ?normal. There is no increase in right ventricular wall thickness.  ? 3. The aortic root is normal in size and structure.  ? ?Recent Labs: ?08/08/2020: ALT 11; Hemoglobin 14.1; Platelets 326.0; TSH 0.69 ?04/21/2021: BUN 12; Creatinine, Ser 0.67; Potassium 4.5; Sodium 136  ?08/08/2020: Cholesterol 187; HDL 46.90; LDL Cholesterol 120; Total CHOL/HDL Ratio 4; Triglycerides 97.0; VLDL 19.4  ? ?CrCl cannot be calculated (Patient's most recent lab result is older than the maximum 21 days allowed.).  ? ?Wt Readings from Last 3 Encounters:  ?04/21/21 164 lb 6.4 oz (74.6 kg)  ?02/03/21 167 lb (75.8 kg)  ?08/08/20 164 lb 4 oz (74.5 kg)  ?  ? ?Other studies reviewed: ?Additional studies/records reviewed today include:  summarized above ? ?ASSESSMENT AND PLAN: ? ?1. PVCs ?2. Paroxysmal AFib ?    CHA2DS2Vasc remains 2 for HTN, gender ?    Not on a/c ?    ?*** ? ?2. HTN ?    ***  ? ? ?3. OSA ?    *** As above ? ? ?     ?

## 2021-06-03 ENCOUNTER — Ambulatory Visit: Payer: Self-pay | Admitting: Nurse Practitioner

## 2021-06-03 ENCOUNTER — Other Ambulatory Visit: Payer: Self-pay | Admitting: Family Medicine

## 2021-06-03 DIAGNOSIS — I10 Essential (primary) hypertension: Secondary | ICD-10-CM

## 2021-06-08 ENCOUNTER — Ambulatory Visit: Payer: Self-pay | Admitting: Physician Assistant

## 2021-07-03 ENCOUNTER — Ambulatory Visit: Payer: Self-pay | Admitting: Nurse Practitioner

## 2021-07-11 ENCOUNTER — Other Ambulatory Visit: Payer: Self-pay | Admitting: Family Medicine

## 2021-07-11 DIAGNOSIS — I10 Essential (primary) hypertension: Secondary | ICD-10-CM

## 2021-07-22 ENCOUNTER — Other Ambulatory Visit: Payer: Self-pay | Admitting: Family Medicine

## 2021-07-22 DIAGNOSIS — K219 Gastro-esophageal reflux disease without esophagitis: Secondary | ICD-10-CM

## 2021-07-22 NOTE — Telephone Encounter (Signed)
Chart supports rx refill ?Last ov: 05/04/21 ?Last refill: 06/30/21 ? ?

## 2021-07-24 ENCOUNTER — Ambulatory Visit: Payer: BC Managed Care – PPO | Admitting: Nurse Practitioner

## 2021-07-24 ENCOUNTER — Encounter: Payer: Self-pay | Admitting: Nurse Practitioner

## 2021-07-24 VITALS — BP 138/80 | HR 80 | Temp 97.0°F | Ht 61.0 in | Wt 161.4 lb

## 2021-07-24 DIAGNOSIS — L910 Hypertrophic scar: Secondary | ICD-10-CM | POA: Insufficient documentation

## 2021-07-24 DIAGNOSIS — F17213 Nicotine dependence, cigarettes, with withdrawal: Secondary | ICD-10-CM | POA: Diagnosis not present

## 2021-07-24 DIAGNOSIS — K219 Gastro-esophageal reflux disease without esophagitis: Secondary | ICD-10-CM | POA: Diagnosis not present

## 2021-07-24 DIAGNOSIS — B351 Tinea unguium: Secondary | ICD-10-CM | POA: Diagnosis not present

## 2021-07-24 DIAGNOSIS — I10 Essential (primary) hypertension: Secondary | ICD-10-CM

## 2021-07-24 DIAGNOSIS — I480 Paroxysmal atrial fibrillation: Secondary | ICD-10-CM

## 2021-07-24 MED ORDER — METOPROLOL SUCCINATE ER 25 MG PO TB24: 25.0000 mg | ORAL_TABLET | Freq: Every day | ORAL | 3 refills | Status: DC

## 2021-07-24 MED ORDER — TERBINAFINE HCL 250 MG PO TABS: 250.0000 mg | ORAL_TABLET | Freq: Every day | ORAL | 0 refills | Status: DC

## 2021-07-24 MED ORDER — PANTOPRAZOLE SODIUM 40 MG PO TBEC: 40.0000 mg | DELAYED_RELEASE_TABLET | Freq: Every day | ORAL | 1 refills | Status: DC

## 2021-07-24 MED ORDER — CHANTIX STARTING MONTH PAK 0.5 MG X 11 & 1 MG X 42 PO TBPK: ORAL_TABLET | ORAL | 0 refills | Status: DC

## 2021-07-24 MED ORDER — SPIRONOLACTONE 25 MG PO TABS: 25.0000 mg | ORAL_TABLET | Freq: Every day | ORAL | 0 refills | Status: DC

## 2021-07-24 NOTE — Patient Instructions (Signed)
Schedule lab appt for repeat hepatic panel in 22month ? ?Set quit date. Start chantix 1week before quit date. ?

## 2021-07-24 NOTE — Assessment & Plan Note (Signed)
Unable to start chantix due to cost. ?She has insurance coverage. ?New rx sent ?

## 2021-07-24 NOTE — Progress Notes (Signed)
? ?             Established Patient Visit ? ?Patient: Emma Stephens   DOB: Nov 16, 1970   51 y.o. Female  MRN: [id number] ?Visit Date: 07/24/2021 ? ?Subjective:  ?  ?Chief Complaint  ?Patient presents with  ? Follow-up  ?  F/u for Anxiety Medications. ?Needs Chantix refilled now that she as new insurance. ?Pap scheduled with GYN  ? ?HPI ?Essential hypertension, benign ?BP at goal with spironolactone, cardizem, and metoprolol ?BP Readings from Last 3 Encounters:  ?07/24/21 138/80  ?04/21/21 (!) 150/90  ?02/26/21 134/86  ? ?Maintain med dose refill sent ? ?Paroxysmal atrial fibrillation (HCC) ?Rate controlled with metoprolol and cardizem ?No anticoagulant at this time ?Regular rhythm on exam ? ?Cigarette nicotine dependence ?Unable to start chantix due to cost. ?She has insurance coverage. ?New rx sent ? ? ?Reviewed medical, surgical, and social history today ? ?Medications: ?Outpatient Medications Prior to Visit  ?Medication Sig  ? cholecalciferol (VITAMIN D3) 25 MCG (1000 UT) tablet Take 1,000 Units by mouth daily.  ? Cyanocobalamin (VITAMIN B-12 PO) Take 1 tablet by mouth daily. Take 2 3000 mg tablets daily  ? diltiazem (CARDIZEM CD) 240 MG 24 hr capsule TAKE 1 CAPSULE BY MOUTH EVERY DAY  ? Prenatal Multivit-Min-Fe-FA (PRENATAL 1 + IRON PO) Take 1 tablet by mouth daily.  ? [DISCONTINUED] metoprolol succinate (TOPROL-XL) 25 MG 24 hr tablet TAKE 1 TABLET (25 MG TOTAL) BY MOUTH DAILY.  ? [DISCONTINUED] pantoprazole (PROTONIX) 40 MG tablet TAKE 1 TABLET BY MOUTH EVERY DAY  ? [DISCONTINUED] spironolactone (ALDACTONE) 25 MG tablet TAKE 1 TABLET (25 MG TOTAL) BY MOUTH DAILY.  ? ?Facility-Administered Medications Prior to Visit  ?Medication Dose Route Frequency Provider  ? 0.9 %  sodium chloride infusion  500 mL Intravenous Once Mansouraty, Telford Nab., MD  ? ?Reviewed past medical and social history.  ? ?ROS per HPI above ? ? ?   ?Objective:  ?BP 138/80 (BP Location: Left Arm, Patient Position: Sitting, Cuff Size: Normal)    Pulse 80   Temp (!) 97 ?F (36.1 ?C) (Temporal)   Ht '5\' 1"'$  (1.549 m)   Wt 161 lb 6.4 oz (73.2 kg)   LMP 05/18/2021   SpO2 96%   BMI 30.50 kg/m?  ? ?  ? ?Physical Exam ?Cardiovascular:  ?   Rate and Rhythm: Normal rate and regular rhythm.  ?   Pulses: Normal pulses.  ?   Heart sounds: Normal heart sounds.  ?Pulmonary:  ?   Effort: Pulmonary effort is normal.  ?   Breath sounds: Normal breath sounds.  ?Musculoskeletal:  ?   Right lower leg: No edema.  ?   Left lower leg: No edema.  ?Neurological:  ?   Mental Status: She is alert and oriented to person, place, and time.  ?  ?No results found for any visits on 07/24/21. ?   ?Assessment & Plan:  ?  ?Problem List Items Addressed This Visit   ? ?  ? Cardiovascular and Mediastinum  ? Essential hypertension, benign - Primary  ?  BP at goal with spironolactone, cardizem, and metoprolol ?BP Readings from Last 3 Encounters:  ?07/24/21 138/80  ?04/21/21 (!) 150/90  ?02/26/21 134/86  ? ?Maintain med dose refill sent ?  ?  ? Relevant Medications  ? metoprolol succinate (TOPROL-XL) 25 MG 24 hr tablet  ? spironolactone (ALDACTONE) 25 MG tablet  ? Paroxysmal atrial fibrillation (HCC)  ?  Rate controlled with metoprolol and cardizem ?No anticoagulant at  this time ?Regular rhythm on exam ? ?  ?  ? Relevant Medications  ? metoprolol succinate (TOPROL-XL) 25 MG 24 hr tablet  ? spironolactone (ALDACTONE) 25 MG tablet  ?  ? Digestive  ? Gastroesophageal reflux disease without esophagitis  ? Relevant Medications  ? pantoprazole (PROTONIX) 40 MG tablet  ?  ? Other  ? Cigarette nicotine dependence  ?  Unable to start chantix due to cost. ?She has insurance coverage. ?New rx sent ? ?  ?  ? Relevant Medications  ? Varenicline Tartrate, Starter, (CHANTIX STARTING MONTH PAK) 0.5 MG X 11 & 1 MG X 42 TBPK  ? ?Other Visit Diagnoses   ? ? Onychomycosis      ? Relevant Medications  ? terbinafine (LAMISIL) 250 MG tablet  ? Other Relevant Orders  ? Hepatic function panel  ? ?  ? ?Return in about  3 months (around 10/24/2021) for HTN, tobacco use and , hyperlipidemia (fasting). ? ?  ? ?Wilfred Lacy, NP ? ? ?

## 2021-07-24 NOTE — Assessment & Plan Note (Signed)
BP at goal with spironolactone, cardizem, and metoprolol ?BP Readings from Last 3 Encounters:  ?07/24/21 138/80  ?04/21/21 (!) 150/90  ?02/26/21 134/86  ? ?Maintain med dose refill sent ?

## 2021-07-24 NOTE — Assessment & Plan Note (Signed)
Rate controlled with metoprolol and cardizem ?No anticoagulant at this time ?Regular rhythm on exam ?

## 2021-08-28 ENCOUNTER — Other Ambulatory Visit (INDEPENDENT_AMBULATORY_CARE_PROVIDER_SITE_OTHER): Payer: BC Managed Care – PPO

## 2021-08-28 ENCOUNTER — Other Ambulatory Visit: Payer: Self-pay

## 2021-08-28 DIAGNOSIS — B351 Tinea unguium: Secondary | ICD-10-CM

## 2021-08-28 LAB — HEPATIC FUNCTION PANEL
ALT: 10 U/L (ref 0–35)
AST: 15 U/L (ref 0–37)
Albumin: 4.3 g/dL (ref 3.5–5.2)
Alkaline Phosphatase: 72 U/L (ref 39–117)
Bilirubin, Direct: 0.1 mg/dL (ref 0.0–0.3)
Total Bilirubin: 0.5 mg/dL (ref 0.2–1.2)
Total Protein: 7.1 g/dL (ref 6.0–8.3)

## 2021-08-28 MED ORDER — TERBINAFINE HCL 250 MG PO TABS: 250.0000 mg | ORAL_TABLET | Freq: Every day | ORAL | 1 refills | Status: DC

## 2021-08-28 NOTE — Addendum Note (Signed)
Addended by: Wilfred Lacy L on: 08/28/2021 11:11 AM   Modules accepted: Orders

## 2021-08-28 NOTE — Progress Notes (Signed)
Per orders of Wilfred Lacy, NP, pt is here for lab draw. Blood  was drawn from [facility] vein. Pt tolerated blood draw well. Sw, cma

## 2021-09-20 ENCOUNTER — Other Ambulatory Visit: Payer: Self-pay | Admitting: Family Medicine

## 2021-09-20 DIAGNOSIS — I10 Essential (primary) hypertension: Secondary | ICD-10-CM

## 2021-10-06 ENCOUNTER — Other Ambulatory Visit: Payer: Self-pay | Admitting: Nurse Practitioner

## 2021-10-06 DIAGNOSIS — B351 Tinea unguium: Secondary | ICD-10-CM

## 2021-10-06 DIAGNOSIS — F17213 Nicotine dependence, cigarettes, with withdrawal: Secondary | ICD-10-CM

## 2021-10-07 NOTE — Telephone Encounter (Signed)
Chart supports Rx Last OV: 07/2021 Next OV: 10/2021

## 2021-10-23 ENCOUNTER — Telehealth: Payer: Self-pay | Admitting: Nurse Practitioner

## 2021-10-23 ENCOUNTER — Ambulatory Visit: Payer: BC Managed Care – PPO | Admitting: Nurse Practitioner

## 2021-10-23 ENCOUNTER — Encounter: Payer: Self-pay | Admitting: Nurse Practitioner

## 2021-10-23 NOTE — Telephone Encounter (Signed)
8:08a pt running a few minutes late/states she will arrive prior to 0:12Q/[id number] of 10 min policy KO - 3:14C pt called back and will not make it - advised of no show fee KO -  2nd no show, fee generated, letter mailed KO

## 2021-10-23 NOTE — Telephone Encounter (Signed)
8.4.23 no show letter sent

## 2021-10-26 NOTE — Telephone Encounter (Signed)
2nd no show, fee generated, letter mailed - pt called in stating she would not be able to make appt - advised on call of no show fee.

## 2021-11-06 ENCOUNTER — Encounter: Payer: Self-pay | Admitting: Nurse Practitioner

## 2021-11-06 ENCOUNTER — Ambulatory Visit: Payer: BC Managed Care – PPO | Admitting: Nurse Practitioner

## 2021-11-06 VITALS — BP 140/80 | HR 77 | Temp 97.3°F | Ht 61.0 in | Wt 162.0 lb

## 2021-11-06 DIAGNOSIS — N951 Menopausal and female climacteric states: Secondary | ICD-10-CM | POA: Diagnosis not present

## 2021-11-06 DIAGNOSIS — I10 Essential (primary) hypertension: Secondary | ICD-10-CM | POA: Diagnosis not present

## 2021-11-06 DIAGNOSIS — F17213 Nicotine dependence, cigarettes, with withdrawal: Secondary | ICD-10-CM

## 2021-11-06 DIAGNOSIS — I480 Paroxysmal atrial fibrillation: Secondary | ICD-10-CM | POA: Diagnosis not present

## 2021-11-06 DIAGNOSIS — G4733 Obstructive sleep apnea (adult) (pediatric): Secondary | ICD-10-CM

## 2021-11-06 LAB — COMPREHENSIVE METABOLIC PANEL
ALT: 11 U/L (ref 0–35)
AST: 18 U/L (ref 0–37)
Albumin: 4.8 g/dL (ref 3.5–5.2)
Alkaline Phosphatase: 82 U/L (ref 39–117)
BUN: 11 mg/dL (ref 6–23)
CO2: 32 mEq/L (ref 19–32)
Calcium: 10.2 mg/dL (ref 8.4–10.5)
Chloride: 103 mEq/L (ref 96–112)
Creatinine, Ser: 0.79 mg/dL (ref 0.40–1.20)
GFR: 85.89 mL/min (ref >60.00)
Glucose, Bld: 91 mg/dL (ref 70–99)
Potassium: 4.9 mEq/L (ref 3.5–5.1)
Sodium: 142 mEq/L (ref 135–145)
Total Bilirubin: 0.4 mg/dL (ref 0.2–1.2)
Total Protein: 8 g/dL (ref 6.0–8.3)

## 2021-11-06 MED ORDER — SPIRONOLACTONE 25 MG PO TABS: 25.0000 mg | ORAL_TABLET | Freq: Every day | ORAL | 1 refills | Status: DC

## 2021-11-06 MED ORDER — DILTIAZEM HCL ER COATED BEADS 240 MG PO CP24: 240.0000 mg | ORAL_CAPSULE | Freq: Every day | ORAL | 3 refills | Status: DC

## 2021-11-06 MED ORDER — LOSARTAN POTASSIUM 25 MG PO TABS: 25.0000 mg | ORAL_TABLET | Freq: Every morning | ORAL | 5 refills | Status: DC

## 2021-11-06 MED ORDER — SERTRALINE HCL 25 MG PO TABS: 25.0000 mg | ORAL_TABLET | Freq: Every day | ORAL | 5 refills | Status: DC

## 2021-11-06 NOTE — Patient Instructions (Signed)
Start zoloft '25mg'$  in Pm and losartan '25mg'$  in AM Maintain other med doses Go to lab  Quitting Smoking You will learn how quitting smoking can help prevent a variety of health problems and improve your health. To view the content, go to this web address: [email or web address]  This video will expire on: 09/12/2023. If you need access to this video following this date, please reach out to the healthcare provider who assigned it to you. This information is not intended to replace advice given to you by your health care provider. Make sure you discuss any questions you have with your health care provider. [employer] Patient Education  Emma Stephens.

## 2021-11-06 NOTE — Progress Notes (Signed)
Established Patient Visit  Patient: Emma Stephens   DOB: 11/17/1970   51 y.o. Female  MRN: [id number] Visit Date: 11/08/2021  Subjective:    Chief Complaint  Patient presents with   Follow-up    3 month follow up, no concerns. Patent had coffee w/ cream and sugar.    HPI Cigarette nicotine dependence Tobacco use since age 69. Smokes 1ppd No previous attempt to quit Started Chantix 3weeks ago, no adverse effects, has decreased to 1/2ppd. Trigger: smokes in care and on her back porch.  Advised to eliminated cigarettes. We discussed ways to avoid trigger and also use of Nicorette gum to manage cravings.  Essential hypertension, benign BP not at goal with cardiazem, metoprolol and spironolactone BP Readings from Last 3 Encounters:  11/06/21 (!) 140/80  07/24/21 138/80  04/21/21 (!) 150/90   Advised to quit tobacco use and maintain DASH diet  Reviewed medical, surgical, and social history today  Medications: Outpatient Medications Prior to Visit  Medication Sig   cholecalciferol (VITAMIN D3) 25 MCG (1000 UT) tablet Take 1,000 Units by mouth daily.   Cyanocobalamin (VITAMIN B-12 PO) Take 1 tablet by mouth daily. Take 2 3000 mg tablets daily   metoprolol succinate (TOPROL-XL) 25 MG 24 hr tablet Take 1 tablet (25 mg total) by mouth daily.   pantoprazole (PROTONIX) 40 MG tablet Take 1 tablet (40 mg total) by mouth daily.   Prenatal Multivit-Min-Fe-FA (PRENATAL 1 + IRON PO) Take 1 tablet by mouth daily.   varenicline (CHANTIX PAK) 0.5 MG X 11 & 1 MG X 42 tablet TAKE 0.5 MG BY MOUTH DAILY AT 12 NOON FOR 3 DAYS, THEN 0.5 MG 2 TIMES DAILY FOR 4 DAYS, THEN 1 MG 2 TIMES DAILY.   [DISCONTINUED] diltiazem (CARDIZEM CD) 240 MG 24 hr capsule TAKE 1 CAPSULE BY MOUTH EVERY DAY   [DISCONTINUED] spironolactone (ALDACTONE) 25 MG tablet Take 1 tablet (25 mg total) by mouth daily.   terbinafine (LAMISIL) 250 MG tablet TAKE 1 TABLET BY MOUTH EVERY DAY (Patient not taking: Reported  on 11/06/2021)   Facility-Administered Medications Prior to Visit  Medication Dose Route Frequency Provider   0.9 %  sodium chloride infusion  500 mL Intravenous Once Mansouraty, Telford Nab., MD   Reviewed past medical and social history.   ROS per HPI above      Objective:  BP (!) 140/80 (BP Location: Right Arm, Patient Position: Sitting, Cuff Size: Normal)   Pulse 77   Temp (!) 97.3 F (36.3 C) (Temporal)   Ht '5\' 1"'$  (1.549 m)   Wt 162 lb (73.5 kg)   SpO2 97%   BMI 30.61 kg/m      Physical Exam Vitals reviewed.  Cardiovascular:     Rate and Rhythm: Normal rate and regular rhythm.     Pulses: Normal pulses.     Heart sounds: Normal heart sounds.  Pulmonary:     Effort: Pulmonary effort is normal.     Breath sounds: Normal breath sounds.  Musculoskeletal:     Right lower leg: No edema.     Left lower leg: No edema.  Neurological:     Mental Status: She is alert and oriented to person, place, and time.     Results for orders placed or performed in visit on 11/06/21  Comprehensive metabolic panel  Result Value Ref Range   Sodium 142 135 - 145 mEq/L   Potassium 4.9 3.5 -  5.1 mEq/L   Chloride 103 96 - 112 mEq/L   CO2 32 19 - 32 mEq/L   Glucose, Bld 91 70 - 99 mg/dL   BUN 11 6 - 23 mg/dL   Creatinine, Ser 0.79 0.40 - 1.20 mg/dL   Total Bilirubin 0.4 0.2 - 1.2 mg/dL   Alkaline Phosphatase 82 39 - 117 U/L   AST 18 0 - 37 U/L   ALT 11 0 - 35 U/L   Total Protein 8.0 6.0 - 8.3 g/dL   Albumin 4.8 3.5 - 5.2 g/dL   GFR 85.89 >60.00 mL/min   Calcium 10.2 8.4 - 10.5 mg/dL      Assessment & Plan:    Problem List Items Addressed This Visit       Cardiovascular and Mediastinum   Essential hypertension, benign - Primary    BP not at goal with cardiazem, metoprolol and spironolactone BP Readings from Last 3 Encounters:  11/06/21 (!) 140/80  07/24/21 138/80  04/21/21 (!) 150/90   Advised to quit tobacco use and maintain DASH diet       Relevant Medications    diltiazem (CARDIZEM CD) 240 MG 24 hr capsule   spironolactone (ALDACTONE) 25 MG tablet   losartan (COZAAR) 25 MG tablet   Other Relevant Orders   Comprehensive metabolic panel (Completed)   Paroxysmal atrial fibrillation (HCC)   Relevant Medications   diltiazem (CARDIZEM CD) 240 MG 24 hr capsule   spironolactone (ALDACTONE) 25 MG tablet   losartan (COZAAR) 25 MG tablet   Perimenopausal vasomotor symptoms   Relevant Medications   diltiazem (CARDIZEM CD) 240 MG 24 hr capsule   spironolactone (ALDACTONE) 25 MG tablet   losartan (COZAAR) 25 MG tablet   sertraline (ZOLOFT) 25 MG tablet     Other   Cigarette nicotine dependence    Tobacco use since age 13. Smokes 1ppd No previous attempt to quit Started Chantix 3weeks ago, no adverse effects, has decreased to 1/2ppd. Trigger: smokes in care and on her back porch.  Advised to eliminated cigarettes. We discussed ways to avoid trigger and also use of Nicorette gum to manage cravings.      Return in about 4 weeks (around 12/04/2021) for HTN and tobacco use.     Wilfred Lacy, NP

## 2021-11-08 ENCOUNTER — Encounter: Payer: Self-pay | Admitting: Nurse Practitioner

## 2021-11-08 DIAGNOSIS — N951 Menopausal and female climacteric states: Secondary | ICD-10-CM | POA: Insufficient documentation

## 2021-11-08 NOTE — Assessment & Plan Note (Signed)
Advised to contact cardiology for CPAp machine

## 2021-11-08 NOTE — Assessment & Plan Note (Addendum)
Tobacco use since age 51. Smokes 1ppd No previous attempt to quit Started Chantix 3weeks ago, no adverse effects, has decreased to 1/2ppd. Trigger: smokes in care and on her back porch.  Advised to eliminated cigarettes. We discussed ways to avoid trigger and also use of Nicorette gum to manage cravings.

## 2021-11-08 NOTE — Assessment & Plan Note (Addendum)
BP not at goal with cardiazem, metoprolol and spironolactone BP Readings from Last 3 Encounters:  11/06/21 (!) 140/80  07/24/21 138/80  04/21/21 (!) 150/90   Advised to quit tobacco use and maintain DASH diet Repeat BMP

## 2021-11-08 NOTE — Assessment & Plan Note (Signed)
Irregular bleeding and irritability. agreed to start zoloft F/up in 27month

## 2021-11-30 ENCOUNTER — Other Ambulatory Visit: Payer: Self-pay | Admitting: Nurse Practitioner

## 2021-11-30 DIAGNOSIS — N951 Menopausal and female climacteric states: Secondary | ICD-10-CM

## 2021-12-04 ENCOUNTER — Ambulatory Visit: Payer: BC Managed Care – PPO | Admitting: Nurse Practitioner

## 2021-12-04 ENCOUNTER — Telehealth: Payer: Self-pay | Admitting: Nurse Practitioner

## 2021-12-04 NOTE — Telephone Encounter (Signed)
Pt cancelled at last minute without reason. Was told of the 50.00 ns fee.

## 2021-12-04 NOTE — Telephone Encounter (Signed)
No show history 12/29/2020 no show w/Dr. Ethelene Hal 07/03/2021 no show, pt stated she did not get a call, reminder call shows pt was reached 10/23/2021 pt called stating she would be late, called later and said she needed to reschedule 9/15 pt called to reschedule, notified of no show fee, no reason given but said she wanted to cancel  Please advise on final warning or dismissal letter. Pt has rescheduled for 12/25/2021.

## 2021-12-07 ENCOUNTER — Encounter: Payer: Self-pay | Admitting: Nurse Practitioner

## 2021-12-07 NOTE — Telephone Encounter (Signed)
Sending final warning letter via mail and MyChart.

## 2021-12-25 ENCOUNTER — Encounter: Payer: Self-pay | Admitting: Nurse Practitioner

## 2021-12-25 ENCOUNTER — Ambulatory Visit: Payer: BC Managed Care – PPO | Admitting: Nurse Practitioner

## 2021-12-25 ENCOUNTER — Ambulatory Visit (INDEPENDENT_AMBULATORY_CARE_PROVIDER_SITE_OTHER): Payer: BC Managed Care – PPO

## 2021-12-25 VITALS — BP 136/68 | HR 70 | Ht 61.0 in | Wt 166.8 lb

## 2021-12-25 DIAGNOSIS — M5412 Radiculopathy, cervical region: Secondary | ICD-10-CM | POA: Diagnosis not present

## 2021-12-25 DIAGNOSIS — N951 Menopausal and female climacteric states: Secondary | ICD-10-CM

## 2021-12-25 DIAGNOSIS — M4722 Other spondylosis with radiculopathy, cervical region: Secondary | ICD-10-CM | POA: Diagnosis not present

## 2021-12-25 DIAGNOSIS — M5030 Other cervical disc degeneration, unspecified cervical region: Secondary | ICD-10-CM

## 2021-12-25 DIAGNOSIS — I10 Essential (primary) hypertension: Secondary | ICD-10-CM | POA: Diagnosis not present

## 2021-12-25 DIAGNOSIS — Z87891 Personal history of nicotine dependence: Secondary | ICD-10-CM

## 2021-12-25 DIAGNOSIS — K219 Gastro-esophageal reflux disease without esophagitis: Secondary | ICD-10-CM | POA: Diagnosis not present

## 2021-12-25 DIAGNOSIS — M5010 Cervical disc disorder with radiculopathy, unspecified cervical region: Secondary | ICD-10-CM | POA: Diagnosis not present

## 2021-12-25 MED ORDER — FAMOTIDINE 20 MG PO TABS: 20.0000 mg | ORAL_TABLET | Freq: Every day | ORAL | 1 refills | Status: DC | PRN

## 2021-12-25 MED ORDER — SERTRALINE HCL 50 MG PO TABS: 50.0000 mg | ORAL_TABLET | Freq: Every day | ORAL | 3 refills | Status: DC

## 2021-12-25 MED ORDER — LOSARTAN POTASSIUM 25 MG PO TABS: 25.0000 mg | ORAL_TABLET | Freq: Every morning | ORAL | 3 refills | Status: DC

## 2021-12-25 MED ORDER — PANTOPRAZOLE SODIUM 40 MG PO TBEC: 40.0000 mg | DELAYED_RELEASE_TABLET | Freq: Every day | ORAL | 3 refills | Status: DC

## 2021-12-25 NOTE — Assessment & Plan Note (Addendum)
Stable mood but persistent hot flashes with irregular menstrual cycle (every 3-14month, LMP 12/25/2021).  Increase zoloft to '50mg'$ 

## 2021-12-25 NOTE — Assessment & Plan Note (Signed)
intermittent

## 2021-12-25 NOTE — Progress Notes (Unsigned)
Initital visit for cervical radiculopathy. Had neck surgery 16 years ago for a herniated disc. Recently, having numbness and tingling from her rt shoulder down her rt arm.

## 2021-12-25 NOTE — Progress Notes (Signed)
Established Patient Visit  Patient: Emma Stephens   DOB: December 09, 1970   51 y.o. Female  MRN: [id number] Visit Date: 12/25/2021  Subjective:    Chief Complaint  Patient presents with   office visit    Office Visit HTN/Tobacco Acid reflux Pap schedule     HPI Former tobacco use Quit tobacco use 18month ago, but started use of vaping. Advised to discontinue  Gastroesophageal reflux disease without esophagitis intermittent  Perimenopausal vasomotor symptoms Stable mood but persistent hot flashes with irregular menstrual cycle (every 3-664month LMP 12/25/2021).  Increase zoloft to '50mg'$   Essential hypertension, benign Improved BP control with tobacco use cessation BP Readings from Last 3 Encounters:  12/25/21 136/68  11/06/21 (!) 140/80  07/24/21 138/80   Maintain med doses Reviewed medical, surgical, and social history today  Medications: Outpatient Medications Prior to Visit  Medication Sig   cholecalciferol (VITAMIN D3) 25 MCG (1000 UT) tablet Take 1,000 Units by mouth daily.   Cyanocobalamin (VITAMIN B-12 PO) Take 1 tablet by mouth daily. Take 2 3000 mg tablets daily   diltiazem (CARDIZEM CD) 240 MG 24 hr capsule Take 1 capsule (240 mg total) by mouth daily.   metoprolol succinate (TOPROL-XL) 25 MG 24 hr tablet Take 1 tablet (25 mg total) by mouth daily.   Prenatal Multivit-Min-Fe-FA (PRENATAL 1 + IRON PO) Take 1 tablet by mouth daily.   spironolactone (ALDACTONE) 25 MG tablet Take 1 tablet (25 mg total) by mouth daily.   [DISCONTINUED] losartan (COZAAR) 25 MG tablet Take 1 tablet (25 mg total) by mouth in the morning.   [DISCONTINUED] pantoprazole (PROTONIX) 40 MG tablet Take 1 tablet (40 mg total) by mouth daily.   [DISCONTINUED] sertraline (ZOLOFT) 25 MG tablet TAKE 1 TABLET BY MOUTH EVERYDAY AT BEDTIME   [DISCONTINUED] terbinafine (LAMISIL) 250 MG tablet TAKE 1 TABLET BY MOUTH EVERY DAY (Patient not taking: Reported on 11/06/2021)   [DISCONTINUED]  varenicline (CHANTIX PAK) 0.5 MG X 11 & 1 MG X 42 tablet TAKE 0.5 MG BY MOUTH DAILY AT 12 NOON FOR 3 DAYS, THEN 0.5 MG 2 TIMES DAILY FOR 4 DAYS, THEN 1 MG 2 TIMES DAILY. (Patient not taking: Reported on 12/25/2021)   Facility-Administered Medications Prior to Visit  Medication Dose Route Frequency Provider   0.9 %  sodium chloride infusion  500 mL Intravenous Once Mansouraty, GaTelford Nab MD   Reviewed past medical and social history.   ROS per HPI above      Objective:  BP 136/68   Pulse 70   Ht '5\' 1"'$  (1.549 m)   Wt 166 lb 12.8 oz (75.7 kg)   LMP 12/25/2021 (Exact Date)   SpO2 98%   BMI 31.52 kg/m      Physical Exam Cardiovascular:     Rate and Rhythm: Normal rate.     Pulses: Normal pulses.  Pulmonary:     Effort: Pulmonary effort is normal.  Musculoskeletal:     Right lower leg: No edema.     Left lower leg: No edema.  Neurological:     Mental Status: She is alert and oriented to person, place, and time.     No results found for any visits on 12/25/21.    Assessment & Plan:    Problem List Items Addressed This Visit       Cardiovascular and Mediastinum   Essential hypertension, benign - Primary    Improved BP control with tobacco use  cessation BP Readings from Last 3 Encounters:  12/25/21 136/68  11/06/21 (!) 140/80  07/24/21 138/80   Maintain med doses      Relevant Medications   losartan (COZAAR) 25 MG tablet   Perimenopausal vasomotor symptoms    Stable mood but persistent hot flashes with irregular menstrual cycle (every 3-44month, LMP 12/25/2021).  Increase zoloft to '50mg'$       Relevant Medications   sertraline (ZOLOFT) 50 MG tablet   losartan (COZAAR) 25 MG tablet     Digestive   Gastroesophageal reflux disease without esophagitis    intermittent      Relevant Medications   pantoprazole (PROTONIX) 40 MG tablet   famotidine (PEPCID) 20 MG tablet     Nervous and Auditory   Cervical radiculopathy   Relevant Medications   sertraline  (ZOLOFT) 50 MG tablet   Other Relevant Orders   DG Cervical Spine Complete     Other   Former tobacco use    Quit tobacco use 253monthago, but started use of vaping. Advised to discontinue      Return in about 3 months (around 03/27/2022) for CPE (fasting).     ChWilfred LacyNP

## 2021-12-25 NOTE — Assessment & Plan Note (Signed)
Improved BP control with tobacco use cessation BP Readings from Last 3 Encounters:  12/25/21 136/68  11/06/21 (!) 140/80  07/24/21 138/80   Maintain med doses

## 2021-12-25 NOTE — Patient Instructions (Signed)
Go to lab

## 2021-12-25 NOTE — Assessment & Plan Note (Signed)
Quit tobacco use 41month ago, but started use of vaping. Advised to discontinue

## 2021-12-28 MED ORDER — CYCLOBENZAPRINE HCL 5 MG PO TABS: 5.0000 mg | ORAL_TABLET | Freq: Every day | ORAL | 0 refills | Status: DC

## 2021-12-28 MED ORDER — PREDNISONE 10 MG (21) PO TBPK: ORAL_TABLET | ORAL | 0 refills | Status: DC

## 2021-12-28 NOTE — Addendum Note (Signed)
Addended by: Wilfred Lacy L on: 12/28/2021 03:13 PM   Modules accepted: Orders

## 2022-02-19 ENCOUNTER — Encounter (HOSPITAL_BASED_OUTPATIENT_CLINIC_OR_DEPARTMENT_OTHER): Payer: Self-pay | Admitting: Emergency Medicine

## 2022-02-19 ENCOUNTER — Inpatient Hospital Stay (HOSPITAL_BASED_OUTPATIENT_CLINIC_OR_DEPARTMENT_OTHER)
Admission: EM | Admit: 2022-02-19 | Discharge: 2022-02-24 | DRG: 419 | Disposition: A | Discharge status: Home / Self Care | Payer: BC Managed Care – PPO | Attending: Internal Medicine | Admitting: Internal Medicine

## 2022-02-19 ENCOUNTER — Ambulatory Visit: Payer: BC Managed Care – PPO | Admitting: Family Medicine

## 2022-02-19 ENCOUNTER — Emergency Department (HOSPITAL_BASED_OUTPATIENT_CLINIC_OR_DEPARTMENT_OTHER): Payer: BC Managed Care – PPO

## 2022-02-19 ENCOUNTER — Encounter (HOSPITAL_COMMUNITY): Payer: Self-pay

## 2022-02-19 ENCOUNTER — Encounter: Payer: Self-pay | Admitting: Family Medicine

## 2022-02-19 ENCOUNTER — Other Ambulatory Visit: Payer: Self-pay

## 2022-02-19 VITALS — BP 128/80 | HR 98 | Temp 98.6°F | Resp 16 | Ht 61.0 in

## 2022-02-19 DIAGNOSIS — Z888 Allergy status to other drugs, medicaments and biological substances status: Secondary | ICD-10-CM | POA: Diagnosis not present

## 2022-02-19 DIAGNOSIS — I480 Paroxysmal atrial fibrillation: Secondary | ICD-10-CM | POA: Diagnosis not present

## 2022-02-19 DIAGNOSIS — G4730 Sleep apnea, unspecified: Secondary | ICD-10-CM | POA: Diagnosis not present

## 2022-02-19 DIAGNOSIS — R933 Abnormal findings on diagnostic imaging of other parts of digestive tract: Secondary | ICD-10-CM | POA: Diagnosis not present

## 2022-02-19 DIAGNOSIS — G4733 Obstructive sleep apnea (adult) (pediatric): Secondary | ICD-10-CM | POA: Diagnosis not present

## 2022-02-19 DIAGNOSIS — K810 Acute cholecystitis: Secondary | ICD-10-CM | POA: Diagnosis present

## 2022-02-19 DIAGNOSIS — K2950 Unspecified chronic gastritis without bleeding: Secondary | ICD-10-CM | POA: Diagnosis not present

## 2022-02-19 DIAGNOSIS — K2970 Gastritis, unspecified, without bleeding: Secondary | ICD-10-CM | POA: Diagnosis present

## 2022-02-19 DIAGNOSIS — Z833 Family history of diabetes mellitus: Secondary | ICD-10-CM

## 2022-02-19 DIAGNOSIS — R1013 Epigastric pain: Secondary | ICD-10-CM | POA: Diagnosis not present

## 2022-02-19 DIAGNOSIS — Z87891 Personal history of nicotine dependence: Secondary | ICD-10-CM

## 2022-02-19 DIAGNOSIS — K828 Other specified diseases of gallbladder: Secondary | ICD-10-CM | POA: Diagnosis not present

## 2022-02-19 DIAGNOSIS — K811 Chronic cholecystitis: Secondary | ICD-10-CM | POA: Diagnosis not present

## 2022-02-19 DIAGNOSIS — F418 Other specified anxiety disorders: Secondary | ICD-10-CM

## 2022-02-19 DIAGNOSIS — B9681 Helicobacter pylori [H. pylori] as the cause of diseases classified elsewhere: Secondary | ICD-10-CM | POA: Diagnosis not present

## 2022-02-19 DIAGNOSIS — K819 Cholecystitis, unspecified: Secondary | ICD-10-CM | POA: Diagnosis not present

## 2022-02-19 DIAGNOSIS — F419 Anxiety disorder, unspecified: Secondary | ICD-10-CM | POA: Diagnosis present

## 2022-02-19 DIAGNOSIS — Z8249 Family history of ischemic heart disease and other diseases of the circulatory system: Secondary | ICD-10-CM

## 2022-02-19 DIAGNOSIS — R109 Unspecified abdominal pain: Secondary | ICD-10-CM | POA: Diagnosis not present

## 2022-02-19 DIAGNOSIS — M109 Gout, unspecified: Secondary | ICD-10-CM | POA: Diagnosis not present

## 2022-02-19 DIAGNOSIS — I493 Ventricular premature depolarization: Secondary | ICD-10-CM | POA: Diagnosis not present

## 2022-02-19 DIAGNOSIS — I10 Essential (primary) hypertension: Secondary | ICD-10-CM | POA: Diagnosis present

## 2022-02-19 DIAGNOSIS — D1803 Hemangioma of intra-abdominal structures: Secondary | ICD-10-CM | POA: Diagnosis not present

## 2022-02-19 DIAGNOSIS — D649 Anemia, unspecified: Secondary | ICD-10-CM | POA: Diagnosis present

## 2022-02-19 DIAGNOSIS — M549 Dorsalgia, unspecified: Secondary | ICD-10-CM | POA: Diagnosis not present

## 2022-02-19 DIAGNOSIS — K5730 Diverticulosis of large intestine without perforation or abscess without bleeding: Secondary | ICD-10-CM | POA: Diagnosis present

## 2022-02-19 DIAGNOSIS — K660 Peritoneal adhesions (postprocedural) (postinfection): Secondary | ICD-10-CM | POA: Diagnosis present

## 2022-02-19 DIAGNOSIS — K219 Gastro-esophageal reflux disease without esophagitis: Secondary | ICD-10-CM | POA: Diagnosis present

## 2022-02-19 DIAGNOSIS — Z86010 Personal history of colonic polyps: Secondary | ICD-10-CM | POA: Diagnosis not present

## 2022-02-19 DIAGNOSIS — K760 Fatty (change of) liver, not elsewhere classified: Secondary | ICD-10-CM | POA: Diagnosis not present

## 2022-02-19 DIAGNOSIS — I4891 Unspecified atrial fibrillation: Secondary | ICD-10-CM | POA: Diagnosis not present

## 2022-02-19 DIAGNOSIS — Z803 Family history of malignant neoplasm of breast: Secondary | ICD-10-CM | POA: Diagnosis not present

## 2022-02-19 DIAGNOSIS — R112 Nausea with vomiting, unspecified: Secondary | ICD-10-CM | POA: Diagnosis present

## 2022-02-19 DIAGNOSIS — Z79899 Other long term (current) drug therapy: Secondary | ICD-10-CM

## 2022-02-19 DIAGNOSIS — M25569 Pain in unspecified knee: Secondary | ICD-10-CM | POA: Diagnosis present

## 2022-02-19 DIAGNOSIS — R1011 Right upper quadrant pain: Secondary | ICD-10-CM | POA: Diagnosis not present

## 2022-02-19 HISTORY — DX: Helicobacter pylori (H. pylori) as the cause of diseases classified elsewhere: B96.81

## 2022-02-19 HISTORY — DX: Malignant hyperthermia due to anesthesia, initial encounter: T88.3XXA

## 2022-02-19 LAB — COMPREHENSIVE METABOLIC PANEL
ALT: 14 U/L (ref 0–44)
AST: 19 U/L (ref 15–41)
Albumin: 4.2 g/dL (ref 3.5–5.0)
Alkaline Phosphatase: 86 U/L (ref 38–126)
Anion gap: 10 (ref 5–15)
BUN: 10 mg/dL (ref 6–20)
CO2: 24 mmol/L (ref 22–32)
Calcium: 9.2 mg/dL (ref 8.9–10.3)
Chloride: 103 mmol/L (ref 98–111)
Creatinine, Ser: 0.69 mg/dL (ref 0.44–1.00)
GFR, Estimated: >60 mL/min (ref >60)
Glucose, Bld: 113 mg/dL — ABNORMAL HIGH (ref 70–99)
Potassium: 3.6 mmol/L (ref 3.5–5.1)
Sodium: 137 mmol/L (ref 135–145)
Total Bilirubin: 0.8 mg/dL (ref 0.3–1.2)
Total Protein: 7.7 g/dL (ref 6.5–8.1)

## 2022-02-19 LAB — URINALYSIS, ROUTINE W REFLEX MICROSCOPIC
Bilirubin Urine: NEGATIVE
Glucose, UA: NEGATIVE mg/dL
Hgb urine dipstick: NEGATIVE
Ketones, ur: NEGATIVE mg/dL
Leukocytes,Ua: NEGATIVE
Nitrite: NEGATIVE
Protein, ur: NEGATIVE mg/dL
Specific Gravity, Urine: 1.025 (ref 1.005–1.030)
pH: 5.5 (ref 5.0–8.0)

## 2022-02-19 LAB — LIPASE, BLOOD: Lipase: 26 U/L (ref 11–51)

## 2022-02-19 LAB — CBC
HCT: 37 % (ref 36.0–46.0)
Hemoglobin: 12.2 g/dL (ref 12.0–15.0)
MCH: 27.5 pg (ref 26.0–34.0)
MCHC: 33 g/dL (ref 30.0–36.0)
MCV: 83.5 fL (ref 80.0–100.0)
Platelets: 399 10*3/uL (ref 150–400)
RBC: 4.43 MIL/uL (ref 3.87–5.11)
RDW: 14.5 % (ref 11.5–15.5)
WBC: 8.6 10*3/uL (ref 4.0–10.5)
nRBC: 0 % (ref 0.0–0.2)

## 2022-02-19 LAB — PREGNANCY, URINE: Preg Test, Ur: NEGATIVE

## 2022-02-19 MED ORDER — HYOSCYAMINE SULFATE 0.125 MG SL SUBL
0.2500 mg | SUBLINGUAL_TABLET | Freq: Once | SUBLINGUAL | Status: AC
  Administered 2022-02-19: 0.25 mg via SUBLINGUAL
  Filled 2022-02-19: qty 2

## 2022-02-19 MED ORDER — SERTRALINE HCL 50 MG PO TABS
50.0000 mg | ORAL_TABLET | Freq: Every day | ORAL | Status: DC
  Administered 2022-02-21 – 2022-02-24 (×3): 50 mg via ORAL
  Filled 2022-02-19 (×4): qty 1

## 2022-02-19 MED ORDER — DILTIAZEM HCL ER COATED BEADS 240 MG PO CP24
240.0000 mg | ORAL_CAPSULE | Freq: Every day | ORAL | Status: DC
  Administered 2022-02-20 – 2022-02-24 (×5): 240 mg via ORAL
  Filled 2022-02-19 (×6): qty 1

## 2022-02-19 MED ORDER — PANTOPRAZOLE SODIUM 40 MG IV SOLR: 40.0000 mg | INTRAVENOUS | Status: DC

## 2022-02-19 MED ORDER — DICYCLOMINE HCL 10 MG/5ML PO SOLN
10.0000 mg | Freq: Once | ORAL | Status: AC
  Administered 2022-02-20: 10 mg via ORAL
  Filled 2022-02-19: qty 5

## 2022-02-19 MED ORDER — MORPHINE SULFATE (PF) 4 MG/ML IV SOLN
4.0000 mg | Freq: Once | INTRAVENOUS | Status: AC
  Administered 2022-02-19: 4 mg via INTRAVENOUS
  Filled 2022-02-19 (×2): qty 1

## 2022-02-19 MED ORDER — PANTOPRAZOLE SODIUM 40 MG PO TBEC: 40.0000 mg | DELAYED_RELEASE_TABLET | Freq: Every day | ORAL | Status: DC

## 2022-02-19 MED ORDER — MORPHINE SULFATE (PF) 4 MG/ML IV SOLN
4.0000 mg | INTRAVENOUS | Status: DC | PRN
  Administered 2022-02-19 – 2022-02-20 (×3): 4 mg via INTRAVENOUS
  Filled 2022-02-19 (×3): qty 1

## 2022-02-19 MED ORDER — LOSARTAN POTASSIUM 25 MG PO TABS
25.0000 mg | ORAL_TABLET | Freq: Every day | ORAL | Status: DC
  Filled 2022-02-19: qty 1

## 2022-02-19 MED ORDER — LIDOCAINE VISCOUS HCL 2 % MT SOLN
15.0000 mL | Freq: Once | OROMUCOSAL | Status: AC
  Administered 2022-02-19: 15 mL via ORAL
  Filled 2022-02-19: qty 15

## 2022-02-19 MED ORDER — IOHEXOL 300 MG/ML  SOLN
100.0000 mL | Freq: Once | INTRAMUSCULAR | Status: AC | PRN
  Administered 2022-02-19: 100 mL via INTRAVENOUS

## 2022-02-19 MED ORDER — ONDANSETRON HCL 4 MG/2ML IJ SOLN
4.0000 mg | Freq: Once | INTRAMUSCULAR | Status: AC
  Administered 2022-02-19: 4 mg via INTRAVENOUS
  Filled 2022-02-19: qty 2

## 2022-02-19 MED ORDER — PANTOPRAZOLE SODIUM 40 MG IV SOLR
INTRAVENOUS | Status: AC
  Administered 2022-02-19: 80 mg
  Filled 2022-02-19: qty 20

## 2022-02-19 MED ORDER — MORPHINE SULFATE (PF) 4 MG/ML IV SOLN
4.0000 mg | Freq: Once | INTRAVENOUS | Status: AC
  Administered 2022-02-19: 4 mg via INTRAVENOUS
  Filled 2022-02-19: qty 1

## 2022-02-19 MED ORDER — PANTOPRAZOLE 80MG IVPB - SIMPLE MED
80.0000 mg | Freq: Once | INTRAVENOUS | Status: AC
  Administered 2022-02-19: 80 mg via INTRAVENOUS
  Filled 2022-02-19: qty 100

## 2022-02-19 MED ORDER — ALUM & MAG HYDROXIDE-SIMETH 200-200-20 MG/5ML PO SUSP: 30.0000 mL | Freq: Once | ORAL | Status: DC

## 2022-02-19 MED ORDER — HYDROMORPHONE HCL 1 MG/ML IJ SOLN
0.5000 mg | Freq: Once | INTRAMUSCULAR | Status: AC
  Administered 2022-02-19: 0.5 mg via INTRAVENOUS
  Filled 2022-02-19: qty 0.5

## 2022-02-19 MED ORDER — LACTATED RINGERS IV SOLN: INTRAVENOUS | Status: DC

## 2022-02-19 MED ORDER — POTASSIUM CHLORIDE IN NACL 20-0.9 MEQ/L-% IV SOLN
INTRAVENOUS | Status: DC
  Filled 2022-02-19 (×5): qty 1000

## 2022-02-19 MED ORDER — PANTOPRAZOLE SODIUM 40 MG IV SOLR
40.0000 mg | Freq: Two times a day (BID) | INTRAVENOUS | Status: DC
  Administered 2022-02-19 – 2022-02-24 (×9): 40 mg via INTRAVENOUS
  Filled 2022-02-19 (×9): qty 10

## 2022-02-19 MED ORDER — ALUM & MAG HYDROXIDE-SIMETH 200-200-20 MG/5ML PO SUSP
30.0000 mL | Freq: Once | ORAL | Status: AC
  Administered 2022-02-19: 30 mL via ORAL
  Filled 2022-02-19: qty 30

## 2022-02-19 MED ORDER — LACTATED RINGERS IV BOLUS
1000.0000 mL | Freq: Once | INTRAVENOUS | Status: AC
  Administered 2022-02-19: 1000 mL via INTRAVENOUS

## 2022-02-19 NOTE — ED Notes (Signed)
ED Provider at bedside. 

## 2022-02-19 NOTE — Progress Notes (Signed)
Dr is at pt's bedside.

## 2022-02-19 NOTE — Progress Notes (Signed)
Sent Raenette Rover, NP, the following secure message:  Good evening! Emma Stephens is in so much pain at the epigastric location. She stated that when she had the CT scan, it showed very bad ulcers, possible gallbladder involvement. She is due to have an EGD tomorrow. Currently NPO. I have given her '4mg'$  of Morphine at 2018 with a pain at 10 and it is now a 7, if she is still. She gets a little relief for a short period of time, bending over the bed. The Morphine is ordered as '4mg'$  every 3 hours prn. Is there something else that may be more effective for her, that we can give her and or do, in the interim...? Waiting for response.

## 2022-02-19 NOTE — Progress Notes (Signed)
ACUTE VISIT Chief Complaint  Patient presents with   Abdominal Pain    Started about 2 days ago, getting worse, thought it may be gas at first. No blood in urine    HPI: Emma Stephens is a 51 y.o. female with history of paroxysmal atrial fibrillation, hypertension, OSA, GERD, and iron deficiency anemia here today with her son complaining of gradual onset of abdominal pain.Initially epigastric and now affecting RUQ and RLQ. Pain is radiated to her lower back , like a "band",and to left shoulder.  Decreased in bowel movements, last one today. Nausea and burping, the former one worse when she massage epigastric area/upper abdomen. She has not taken medications for pain.  Abdominal Pain This is a new problem. The current episode started in the past 7 days. The onset quality is gradual. The problem occurs constantly. The problem has been gradually worsening. The pain is located in the RLQ, RUQ and epigastric region. The pain is at a severity of 9/10. The quality of the pain is colicky. The abdominal pain radiates to the left shoulder. Associated symptoms include belching, constipation, flatus and nausea. Pertinent negatives include no dysuria, fever, frequency, headaches, hematochezia, hematuria, melena or vomiting. The pain is aggravated by certain positions and eating. The pain is relieved by Nothing.   She states that she has history of bloating sensation in the past but has not these pain before. She still has appetite but she is afraid of eating because of pain.  Review of Systems  Constitutional:  Positive for activity change and fatigue. Negative for appetite change, diaphoresis and fever.  HENT:  Negative for mouth sores and sore throat.   Respiratory:  Negative for cough, shortness of breath and wheezing.   Gastrointestinal:  Positive for abdominal pain, constipation, flatus and nausea. Negative for hematochezia, melena and vomiting.  Genitourinary:  Negative for decreased  urine volume, dysuria, frequency and hematuria.  Musculoskeletal:  Positive for back pain.  Skin:  Negative for rash.  Neurological:  Negative for syncope and headaches.  See other pertinent positives and negatives in HPI.  Current Outpatient Medications on File Prior to Visit  Medication Sig Dispense Refill   cholecalciferol (VITAMIN D3) 25 MCG (1000 UT) tablet Take 1,000 Units by mouth daily.     Cyanocobalamin (VITAMIN B-12 PO) Take 1 tablet by mouth daily. Take 2 3000 mg tablets daily     cyclobenzaprine (FLEXERIL) 5 MG tablet Take 1-2 tablets (5-10 mg total) by mouth at bedtime. 20 tablet 0   diltiazem (CARDIZEM CD) 240 MG 24 hr capsule Take 1 capsule (240 mg total) by mouth daily. 90 capsule 3   famotidine (PEPCID) 20 MG tablet Take 1 tablet (20 mg total) by mouth daily as needed for heartburn or indigestion. 90 tablet 1   losartan (COZAAR) 25 MG tablet Take 1 tablet (25 mg total) by mouth in the morning. 90 tablet 3   metoprolol succinate (TOPROL-XL) 25 MG 24 hr tablet Take 1 tablet (25 mg total) by mouth daily. 90 tablet 3   pantoprazole (PROTONIX) 40 MG tablet Take 1 tablet (40 mg total) by mouth daily. 90 tablet 3   Prenatal Multivit-Min-Fe-FA (PRENATAL 1 + IRON PO) Take 1 tablet by mouth daily.     sertraline (ZOLOFT) 50 MG tablet Take 1 tablet (50 mg total) by mouth daily. 90 tablet 3   spironolactone (ALDACTONE) 25 MG tablet Take 1 tablet (25 mg total) by mouth daily. 90 tablet 1   Current Facility-Administered Medications on File  Prior to Visit  Medication Dose Route Frequency Provider Last Rate Last Admin   0.9 %  sodium chloride infusion  500 mL Intravenous Once Mansouraty, Telford Nab., MD        Past Medical History:  Diagnosis Date   Anemia    Anxiety    Blood transfusion without reported diagnosis    Cigarette nicotine dependence    GERD (gastroesophageal reflux disease)    Gout 2011   Hypertension    PAF (paroxysmal atrial fibrillation) (HCC)    PVC (premature  ventricular contraction)    Sleep apnea    not on cpap at this time 01-17-20   Allergies  Allergen Reactions   Lisinopril Hives, Swelling and Other (See Comments)    Angioedema and facial swelling   Bupropion Itching and Other (See Comments)    Social History   Socioeconomic History   Marital status: Married    Spouse name: Not on file   Number of children: Not on file   Years of education: Not on file   Highest education level: Not on file  Occupational History   Occupation: Cooking    Employer: [employer]  Tobacco Use   Smoking status: Former    Packs/day: 0.50    Years: 31.00    Total pack years: 15.50    Types: Cigarettes    Quit date: 11/02/2021    Years since quitting: 0.2   Smokeless tobacco: Never  Vaping Use   Vaping Use: Some days  Substance and Sexual Activity   Alcohol use: No    Alcohol/week: 0.0 standard drinks of alcohol   Drug use: No   Sexual activity: Yes    Partners: Male  Other Topics Concern   Not on file  Social History Narrative   Lives in [place] with family.   Social Determinants of Health   Financial Resource Strain: Not on file  Food Insecurity: Not on file  Transportation Needs: Not on file  Physical Activity: Not on file  Stress: Not on file  Social Connections: Not on file   Vitals:   02/19/22 0949  BP: 128/80  Pulse: 98  Resp: 16  Temp: 98.6 F (37 C)  SpO2: 99%   Body mass index is 31.52 kg/m.  Physical Exam Vitals and nursing note reviewed.  Constitutional:      General: She is in acute distress.  HENT:     Head: Normocephalic and atraumatic.  Pulmonary:     Effort: Pulmonary effort is normal. No respiratory distress.  Abdominal:     Palpations: Abdomen is soft.     Tenderness: There is abdominal tenderness in the right upper quadrant and epigastric area. There is guarding and rebound.  Neurological:     General: No focal deficit present.     Mental Status: She is alert and oriented to person, place, and  time.     Comments: Antalgic gait.   ASSESSMENT AND PLAN:  Emma Stephens is a 51 yo female seen today for 2 days of severe abdominal pain, getting worse.  Acute abdominal pain  Pain is severe, could not stay on examination table, abdominal pain and left shoulder pain  worse when lying down. Possible etiologies discussed: Acute appendicitis,PUD, and pancreatitis among some. She is going to need stat abdominal CT and blood work, I cannot have these done here today; so recommend going to the ED. She does not want [place] to call transportation, her son will drive her. She voices understanding and agrees with plan.  Return if symptoms worsen or fail to improve.  Corlis Angelica G. [place], MD  [facility]. [place] office.

## 2022-02-19 NOTE — ED Notes (Signed)
Pt. Reports that she started hurting 2 days ago in the R side of her abd. And the mid abd. That goes to her back.  Pt. Has a cardiac history and A Fib history.  Pt. Is in NSR at present time.

## 2022-02-19 NOTE — Progress Notes (Signed)
Plan of Care Note for accepted transfer  Patient: Emma Stephens    OVZ:[id number]  DOA: 02/19/2022     Facility requesting transfer: [facility] Requesting Provider: Dr. Laverta Baltimore Reason for transfer: Admission for GI consult request Facility course: Patient with PMH of PAF, HTN, OSA, GERD, anemia brought to the hospital with complaints of abdominal pain rating to the back.  Due to severity of the pain the patient was sent to ER.  Patient had thickened edematous appearance of the gastric antrum concerning for gastritis or peptic ulcer disease or possible GI malignancy. H&H and CBC stable.  No leukocytosis. UA unremarkable. EDP discussed with GI.  Initial plan was to go home with outpatient follow-up with GI but patient required multiple IV morphine injection within a short time and continues to have abdominal pain and therefore was referred for admission for pain control. [facility] was consulted and the patient will be seen by them in the consultation.  Per EDP tentative plan for EGD tomorrow.  Plan of care: The patient is accepted for admission to [place]  unit, at [facility]. Patient can be transferred to [facility] or [facility].  Author: Berle Mull, MD  02/19/2022  Check [email or web address] for on-call coverage.  Nursing staff, Please call [place] number on Amion as soon as patient's arrival, so appropriate admitting provider can evaluate the pt.

## 2022-02-19 NOTE — ED Provider Notes (Signed)
Blood pressure 134/65, pulse 68, temperature 98.3 F (36.8 C), temperature source Oral, resp. rate 13, SpO2 96 %.  Assuming care from Dr. Matilde Sprang.  In short, Emma Stephens is a 51 y.o. female with a chief complaint of Abdominal Pain .  Refer to the original H&P for additional details.  The current plan of care is to follow up with GI regarding CT findings. Pain slightly improved with morphine at this time.   03:51 PM Spoke with Dr. Randel Pigg with [facility]. They can consult for upper GI as inpatient. Either [facility] or [facility] is ok for admit.    Margette Fast, MD 02/20/22 [phone]

## 2022-02-19 NOTE — ED Provider Notes (Signed)
[facility] EMERGENCY DEPARTMENT Provider Note  CSN: [id number] Arrival date & time: 02/19/22 1136  Chief Complaint(s) Abdominal Pain  HPI Emma Stephens is a 51 y.o. female with PMH anemia, anxiety, paroxysmal A-fib, HTN who presents emergency department for evaluation of abdominal pain.  Patient states that over the last 7 days pain has been gradually worsening and is acutely worsened over the last 48 hours.  Pain worse in the epigastrium radiating to the shoulder.  Patient was seen by her primary care physician this morning who sent the patient to the emergency department for imaging and further workup.  Currently endorses epigastric abdominal pain but denies nausea, chest pain, shortness of breath, headache, fever or other systemic symptoms.   Past Medical History Past Medical History:  Diagnosis Date   Anemia    Anxiety    Blood transfusion without reported diagnosis    Cigarette nicotine dependence    GERD (gastroesophageal reflux disease)    Gout 2011   Hypertension    PAF (paroxysmal atrial fibrillation) (HCC)    PVC (premature ventricular contraction)    Sleep apnea    not on cpap at this time 01-17-20   Patient Active Problem List   Diagnosis Date Noted   Cervical radiculopathy 12/25/2021   Perimenopausal vasomotor symptoms 11/08/2021   Keloid 07/24/2021   Gastroesophageal reflux disease without esophagitis 08/08/2020   OSA (obstructive sleep apnea) 01/24/2020   Iron deficiency anemia 05/18/2019   Paroxysmal atrial fibrillation ([place]) 06/24/2018   Situational anxiety 12/11/2014   Symptomatic PVCs 10/22/2014   Former tobacco use 10/22/2014   Acute gout 07/30/2014   Essential hypertension, benign 05/27/2014   Recurrent knee pain 05/27/2014   Home Medication(s) Prior to Admission medications   Medication Sig Start Date End Date Taking? Authorizing Provider  cholecalciferol (VITAMIN D3) 25 MCG (1000 UT) tablet Take 1,000 Units by mouth daily.     [provider]  Cyanocobalamin (VITAMIN B-12 PO) Take 1 tablet by mouth daily. Take 2 3000 mg tablets daily    [provider]  cyclobenzaprine (FLEXERIL) 5 MG tablet Take 1-2 tablets (5-10 mg total) by mouth at bedtime. 12/28/21   Nche, Charlene Brooke, NP  diltiazem (CARDIZEM CD) 240 MG 24 hr capsule Take 1 capsule (240 mg total) by mouth daily. 11/06/21   Nche, Charlene Brooke, NP  famotidine (PEPCID) 20 MG tablet Take 1 tablet (20 mg total) by mouth daily as needed for heartburn or indigestion. 12/25/21   Nche, Charlene Brooke, NP  losartan (COZAAR) 25 MG tablet Take 1 tablet (25 mg total) by mouth in the morning. 12/25/21   Nche, Charlene Brooke, NP  metoprolol succinate (TOPROL-XL) 25 MG 24 hr tablet Take 1 tablet (25 mg total) by mouth daily. 07/24/21   Nche, Charlene Brooke, NP  pantoprazole (PROTONIX) 40 MG tablet Take 1 tablet (40 mg total) by mouth daily. 12/25/21   Nche, Charlene Brooke, NP  Prenatal Multivit-Min-Fe-FA (PRENATAL 1 + IRON PO) Take 1 tablet by mouth daily.    [provider]  sertraline (ZOLOFT) 50 MG tablet Take 1 tablet (50 mg total) by mouth daily. 12/25/21   Nche, Charlene Brooke, NP  spironolactone (ALDACTONE) 25 MG tablet Take 1 tablet (25 mg total) by mouth daily. 11/06/21   Nche, Charlene Brooke, NP  Past Surgical History Past Surgical History:  Procedure Laterality Date   NECK SURGERY     TUBAL LIGATION     Family History Family History  Problem Relation Age of Onset   Diabetes Mother    Hypertension Mother    Cancer Father 51       oral   Diabetes Sister    Hypertension Sister    Diabetes Brother    Hypertension Brother    Breast cancer Paternal Grandmother    Sudden Cardiac Death Neg Hx    Heart attack Neg Hx    Colon cancer Neg Hx    Esophageal cancer Neg Hx    Rectal cancer Neg Hx    Stomach cancer Neg Hx      Social History Social History   Tobacco Use   Smoking status: Former    Packs/day: 0.50    Years: 31.00    Total pack years: 15.50    Types: Cigarettes    Quit date: 11/02/2021    Years since quitting: 0.2   Smokeless tobacco: Never  Vaping Use   Vaping Use: Some days  Substance Use Topics   Alcohol use: No    Alcohol/week: 0.0 standard drinks of alcohol   Drug use: No   Allergies Lisinopril and Bupropion  Review of Systems Review of Systems  Gastrointestinal:  Positive for abdominal pain.    Physical Exam Vital Signs  I have reviewed the triage vital signs BP 139/84   Pulse 89   Temp 98.3 F (36.8 C) (Oral)   Resp 20   SpO2 99%   Physical Exam Vitals and nursing note reviewed.  Constitutional:      General: She is not in acute distress.    Appearance: She is well-developed.  HENT:     Head: Normocephalic and atraumatic.  Eyes:     Conjunctiva/sclera: Conjunctivae normal.  Cardiovascular:     Rate and Rhythm: Normal rate and regular rhythm.     Heart sounds: No murmur heard. Pulmonary:     Effort: Pulmonary effort is normal. No respiratory distress.     Breath sounds: Normal breath sounds.  Abdominal:     Palpations: Abdomen is soft.     Tenderness: There is abdominal tenderness in the right upper quadrant and epigastric area.  Musculoskeletal:        General: No swelling.     Cervical back: Neck supple.  Skin:    General: Skin is warm and dry.     Capillary Refill: Capillary refill takes less than 2 seconds.  Neurological:     Mental Status: She is alert.  Psychiatric:        Mood and Affect: Mood normal.     ED Results and Treatments Labs (all labs ordered are listed, but only abnormal results are displayed) Labs Reviewed  COMPREHENSIVE METABOLIC PANEL - Abnormal; Notable for the following components:      Result Value   Glucose, Bld 113 (*)    All other components within normal limits  LIPASE, BLOOD  CBC  URINALYSIS, ROUTINE W  REFLEX MICROSCOPIC  PREGNANCY, URINE  Radiology No results found.  Pertinent labs & imaging results that were available during my care of the patient were reviewed by me and considered in my medical decision making (see MDM for details).  Medications Ordered in ED Medications  lactated ringers bolus 1,000 mL (has no administration in time range)  iohexol (OMNIPAQUE) 300 MG/ML solution 100 mL (has no administration in time range)  morphine (PF) 4 MG/ML injection 4 mg (4 mg Intravenous Given 02/19/22 1321)  ondansetron (ZOFRAN) injection 4 mg (4 mg Intravenous Given 02/19/22 1321)                                                                                                                                     Procedures Procedures  (including critical care time)  Medical Decision Making / ED Course   This patient presents to the ED for concern of abdominal pain, this involves an extensive number of treatment options, and is a complaint that carries with it a high risk of complications and morbidity.  The differential diagnosis includes peptic ulcer disease, gastritis, pancreatitis, cholecystitis, choledocholithiasis, intra-abdominal abscess  MDM: Patient seen emergency room for evaluation of abdominal pain.  Physical exam with epigastric tenderness to palpation but is otherwise unremarkable.  Laboratory evaluation unremarkable.  CT abdomen pelvis concerning for peptic ulcer disease versus severe gastritis.  On reevaluation, patient symptoms not entirely improved and thus a GI cocktail was administered.  At time of signout, patient pending reevaluation by oncoming provider and GI consultation to discuss when the patient will receive an EGD.  Please see provider signout for continuation of workup.   Additional history obtained: -Additional history obtained from  husband -External records from outside source obtained and reviewed including: Chart review including previous notes, labs, imaging, consultation notes   Lab Tests: -I ordered, reviewed, and interpreted labs.   The pertinent results include:   Labs Reviewed  COMPREHENSIVE METABOLIC PANEL - Abnormal; Notable for the following components:      Result Value   Glucose, Bld 113 (*)    All other components within normal limits  LIPASE, BLOOD  CBC  URINALYSIS, ROUTINE W REFLEX MICROSCOPIC  PREGNANCY, URINE      EKG   EKG Interpretation  Date/Time:  Friday February 19 2022 12:13:40 EST Ventricular Rate:  86 PR Interval:  170 QRS Duration: 66 QT Interval:  354 QTC Calculation: 423 R Axis:   30 Text Interpretation: Normal sinus rhythm Abnormal ECG When compared with ECG of 25-Jun-2018 08:30, PREVIOUS ECG IS PRESENT Confirmed by Laurella Tull ([id number]) on 02/19/2022 3:26:22 PM         Imaging Studies ordered: I ordered imaging studies including CT abdomen pelvis with contrast I independently visualized and interpreted imaging. I agree with the radiologist interpretation   Medicines ordered and prescription drug management: Meds ordered this encounter  Medications   morphine (PF) 4 MG/ML injection 4 mg   ondansetron (ZOFRAN) injection  4 mg   lactated ringers bolus 1,000 mL   iohexol (OMNIPAQUE) 300 MG/ML solution 100 mL    -I have reviewed the patients home medicines and have made adjustments as needed  Critical interventions none  Consultations Obtained: I requested consultation with the gastroenterologist on-call,  and discussed lab and imaging findings as well as pertinent plan - they recommend: Recommendations are pending   Cardiac Monitoring: The patient was maintained on a cardiac monitor.  I personally viewed and interpreted the cardiac monitored which showed an underlying rhythm of: NSR  Social Determinants of Health:  Factors impacting patients care include:  none   Reevaluation: After the interventions noted above, I reevaluated the patient and found that they have :stayed the same  Co morbidities that complicate the patient evaluation  Past Medical History:  Diagnosis Date   Anemia    Anxiety    Blood transfusion without reported diagnosis    Cigarette nicotine dependence    GERD (gastroesophageal reflux disease)    Gout 2011   Hypertension    PAF (paroxysmal atrial fibrillation) ([place])    PVC (premature ventricular contraction)    Sleep apnea    not on cpap at this time 01-17-20      Dispostion: I considered admission for this patient, and disposition pending GI recommendations and improvement with ER interventions.  Please see provider signout for continuation of workup.     Final Clinical Impression(s) / ED Diagnoses Final diagnoses:  None     '@PCDICTATION'$ @    Teressa Lower, MD 02/19/22 1527

## 2022-02-19 NOTE — ED Notes (Signed)
Pt ambulatory with steady gait to restroom to provide urine specimen

## 2022-02-19 NOTE — H&P (Incomplete)
PCP:   Flossie Buffy, NP   Chief Complaint:  Epigastric  HPI: This is a 51 year old female with past medical history of atrial fibrillation, GERD, hypertension, anxiety.  Per patient 2 days ago she developed epigastric discomfort, she initially thought it was gas.  Yesterday she became really really sore.  Touching her side made the pain radiate outwards then it started going towards her back.  Last night she could not lay flat or get comfortably in bed because of the severity and the constant pain.  Today she called her PCP who told her to go to the [facility].  The pain is described as sharp, continuous.  Ranked 10/10 at its worst currently more tolerable at 4 of 10.  She denies any hematemesis.  CT abdomen and pelvis done reads: Thickened, edematous appearance of the gastric antrum with mild adjacent fat stranding and trace free fluid. Findings are suggestive of gastritis and/or peptic ulcer disease. Somewhat asymmetric appearance of the mucosal edema along the lesser curvature may be reactive. Underlying neoplasm would be difficult to exclude. Close clinical follow-up as well as a follow-up endoscopy are recommended.  Hospitalist asked to admit.  Review of Systems:  The patient denies anorexia, fever, weight loss,, vision loss, decreased hearing, hoarseness, chest pain, syncope, dyspnea on exertion, peripheral edema, balance deficits, hemoptysis, abdominal pain, melena, hematochezia, severe indigestion/heartburn, hematuria, incontinence, genital sores, muscle weakness, suspicious skin lesions, transient blindness, difficulty walking, depression, unusual weight change, abnormal bleeding, enlarged lymph nodes, angioedema, and breast masses. Positive: Severe epigastric pain  Past Medical History: Past Medical History:  Diagnosis Date   Anemia    Anxiety    Blood transfusion without reported diagnosis    Cigarette nicotine dependence    GERD (gastroesophageal reflux disease)    Gout  2011   Hypertension    PAF (paroxysmal atrial fibrillation) (HCC)    PVC (premature ventricular contraction)    Sleep apnea    not on cpap at this time 01-17-20   Past Surgical History:  Procedure Laterality Date   NECK SURGERY     TUBAL LIGATION      Medications: Prior to Admission medications   Medication Sig Start Date End Date Taking? Authorizing Provider  cholecalciferol (VITAMIN D3) 25 MCG (1000 UT) tablet Take 1,000 Units by mouth daily.    [provider]  Cyanocobalamin (VITAMIN B-12 PO) Take 1 tablet by mouth daily. Take 2 3000 mg tablets daily    [provider]  cyclobenzaprine (FLEXERIL) 5 MG tablet Take 1-2 tablets (5-10 mg total) by mouth at bedtime. 12/28/21   Nche, Charlene Brooke, NP  diltiazem (CARDIZEM CD) 240 MG 24 hr capsule Take 1 capsule (240 mg total) by mouth daily. 11/06/21   Nche, Charlene Brooke, NP  famotidine (PEPCID) 20 MG tablet Take 1 tablet (20 mg total) by mouth daily as needed for heartburn or indigestion. 12/25/21   Nche, Charlene Brooke, NP  losartan (COZAAR) 25 MG tablet Take 1 tablet (25 mg total) by mouth in the morning. 12/25/21   Nche, Charlene Brooke, NP  metoprolol succinate (TOPROL-XL) 25 MG 24 hr tablet Take 1 tablet (25 mg total) by mouth daily. 07/24/21   Nche, Charlene Brooke, NP  pantoprazole (PROTONIX) 40 MG tablet Take 1 tablet (40 mg total) by mouth daily. 12/25/21   Nche, Charlene Brooke, NP  Prenatal Multivit-Min-Fe-FA (PRENATAL 1 + IRON PO) Take 1 tablet by mouth daily.    [provider]  sertraline (ZOLOFT) 50 MG tablet Take 1  tablet (50 mg total) by mouth daily. 12/25/21   Nche, Charlene Brooke, NP  spironolactone (ALDACTONE) 25 MG tablet Take 1 tablet (25 mg total) by mouth daily. 11/06/21   Nche, Charlene Brooke, NP    Allergies:   Allergies  Allergen Reactions   Lisinopril Hives, Swelling and Other (See Comments)    Angioedema and facial swelling   Bupropion Itching and Other (See Comments)    Social History:   reports that she quit smoking about 3 months ago. Her smoking use included cigarettes. She has a 15.50 pack-year smoking history. She has never used smokeless tobacco. She reports that she does not drink alcohol and does not use drugs.  Family History: Family History  Problem Relation Age of Onset   Diabetes Mother    Hypertension Mother    Cancer Father 82       oral   Diabetes Sister    Hypertension Sister    Diabetes Brother    Hypertension Brother    Breast cancer Paternal Grandmother    Sudden Cardiac Death Neg Hx    Heart attack Neg Hx    Colon cancer Neg Hx    Esophageal cancer Neg Hx    Rectal cancer Neg Hx    Stomach cancer Neg Hx     Physical Exam: Vitals:   02/19/22 1624 02/19/22 1645 02/19/22 1845 02/19/22 2143  BP:  134/81 (!) 159/75 135/69  Pulse: 72 64 72 65  Resp:  '18 16 16  '$ Temp: 98.3 F (36.8 C)  98.5 F (36.9 C) 98 F (36.7 C)  TempSrc: Oral  Oral Oral  SpO2: 98% 94% 97% 98%    General:  Alert and oriented times three, well developed and nourished, no acute distress Eyes: PERRLA, pink conjunctiva, no scleral icterus ENT: Moist oral mucosa, neck supple, no thyromegaly Lungs: clear to ascultation, no wheeze, no crackles, no use of accessory muscles Cardiovascular: regular rate and rhythm, no regurgitation, no gallops, no murmurs. No carotid bruits, no JVD Abdomen: soft, decreased bowel sounds, patient protective of her abdomen, very tender to touch. GU: not examined Neuro: CN II - XII grossly intact, sensation intact Musculoskeletal: strength 5/5 all extremities, no clubbing, cyanosis or edema Skin: no rash, no subcutaneous crepitation, no decubitus Psych: appropriate patient   Labs on Admission:  Recent Labs    02/19/22 1238  NA 137  K 3.6  CL 103  CO2 24  GLUCOSE 113*  BUN 10  CREATININE 0.69  CALCIUM 9.2   Recent Labs    02/19/22 1238  AST 19  ALT 14  ALKPHOS 86  BILITOT 0.8  PROT 7.7  ALBUMIN 4.2   Recent Labs     02/19/22 1238  LIPASE 26   Recent Labs    02/19/22 1238  WBC 8.6  HGB 12.2  HCT 37.0  MCV 83.5  PLT 399     Radiological Exams on Admission: CT ABDOMEN PELVIS W CONTRAST  Result Date: 02/19/2022 CLINICAL DATA:  Epigastric pain EXAM: CT ABDOMEN AND PELVIS WITH CONTRAST TECHNIQUE: Multidetector CT imaging of the abdomen and pelvis was performed using the standard protocol following bolus administration of intravenous contrast. RADIATION DOSE REDUCTION: This exam was performed according to the departmental dose-optimization program which includes automated exposure control, adjustment of the mA and/or kV according to patient size and/or use of iterative reconstruction technique. CONTRAST:  143m OMNIPAQUE IOHEXOL 300 MG/ML  SOLN COMPARISON:  None Available. FINDINGS: Lower chest: No acute abnormality. Hepatobiliary: 1.9 cm low-density lesion with  peripheral nodular enhancement in the posterior right hepatic lobe with progressive fill-in on delayed phase imaging. Features are compatible with a hepatic hemangioma. The liver is otherwise normal in appearance. No additional lesions. Unremarkable gallbladder. No hyperdense gallstone. No biliary dilatation. Pancreas: Unremarkable. No pancreatic ductal dilatation or surrounding inflammatory changes. Spleen: Normal in size without focal abnormality. Adrenals/Urinary Tract: Unremarkable adrenal glands. Kidneys enhance symmetrically without solid lesion, stone, or hydronephrosis. Ureters are nondilated. Urinary bladder appears unremarkable for the degree of distention. Stomach/Bowel: Thickened, edematous appearance of the gastric antrum with mild adjacent fat stranding and trace free fluid (series 2, image 33). This segment is mildly dilated. Asymmetric submucosal edema along the lesser curvature of the distal gastric body (series 5, image 68) with luminal narrowing of the stomach at this location, which could be related to peristalsis. No hiatal hernia. No  dilated loops of bowel. Normal appendix in the right lower quadrant. No colonic wall thickening or inflammatory changes. Scattered colonic diverticulosis. Vascular/Lymphatic: Aortic atherosclerosis. No enlarged abdominal or pelvic lymph nodes. Reproductive: Uterus and bilateral adnexa are unremarkable. Other: No ascites. No abdominopelvic fluid collection. No pneumoperitoneum. No abdominal wall hernia. Musculoskeletal: No acute or significant osseous findings. IMPRESSION: 1. Thickened, edematous appearance of the gastric antrum with mild adjacent fat stranding and trace free fluid. Findings are suggestive of gastritis and/or peptic ulcer disease. Somewhat asymmetric appearance of the mucosal edema along the lesser curvature may be reactive. Underlying neoplasm would be difficult to exclude. Close clinical follow-up as well as a follow-up endoscopy are recommended. 2. Colonic diverticulosis without evidence of acute diverticulitis. 3. Benign 1.9 cm hepatic hemangioma. 4. Aortic atherosclerosis (ICD10-I70.0). Electronically Signed   By: Davina Poke D.O.   On: 02/19/2022 14:36    Assessment/Plan Present on Admission:  Persistent epigastric pain -Admit to MedSurg -N.p.o., IV fluid hydration -CT scan suggestive of gastritis or peptic ulcer disease.  Underlying neoplasm difficult to exclude -IV fluid hydration -Protonix twice daily ordered.  80 mg IV Protonix given in ER [facility] -Patient received GI cocktail with no improvement in pain -PRN pain medications -Per note/report: EDP discussed with GI.  Initial plan was to go home with outpatient follow-up with GI but patient required multiple IV morphine injection within a short time and continues to have abdominal pain and therefore was referred for admission for pain control. [facility] was consulted and the patient will be seen by them in the consultation.  Per EDP tentative plan for EGD tomorrow   Essential hypertension, benign -Stable, Cozaar,  metoprolol, Cardizem resumed   Situational anxiety -Zoloft resumed   Paroxysmal atrial fibrillation (HCC) -Metoprolol and Cardizem resumed -CHA2DS2-VASc score 2 -No aspirin given patient's epigastric discomfort/possible ulcer   Gastroesophageal reflux disease without esophagitis -Was on Pepcid twice daily  Fabiola Mudgett 02/19/2022, 9:52 PM

## 2022-02-19 NOTE — Patient Instructions (Signed)
Sent to the ED

## 2022-02-19 NOTE — ED Triage Notes (Signed)
Epigastric pain radiating to back and left shoulder . Chest pain with inhalation . Denies NV . Painful to palpation .  Was seen at PCP , here to rule out appendicitis or pancreatitis per chart note .

## 2022-02-20 DIAGNOSIS — R1013 Epigastric pain: Secondary | ICD-10-CM | POA: Diagnosis not present

## 2022-02-20 LAB — HIV ANTIBODY (ROUTINE TESTING W REFLEX): HIV Screen 4th Generation wRfx: NONREACTIVE

## 2022-02-20 LAB — CBC
HCT: 34.9 % — ABNORMAL LOW (ref 36.0–46.0)
Hemoglobin: 11.1 g/dL — ABNORMAL LOW (ref 12.0–15.0)
MCH: 27.8 pg (ref 26.0–34.0)
MCHC: 31.8 g/dL (ref 30.0–36.0)
MCV: 87.5 fL (ref 80.0–100.0)
Platelets: 357 10*3/uL (ref 150–400)
RBC: 3.99 MIL/uL (ref 3.87–5.11)
RDW: 14.5 % (ref 11.5–15.5)
WBC: 8.7 10*3/uL (ref 4.0–10.5)
nRBC: 0 % (ref 0.0–0.2)

## 2022-02-20 LAB — BASIC METABOLIC PANEL
Anion gap: 7 (ref 5–15)
BUN: 11 mg/dL (ref 6–20)
CO2: 27 mmol/L (ref 22–32)
Calcium: 8.6 mg/dL — ABNORMAL LOW (ref 8.9–10.3)
Chloride: 104 mmol/L (ref 98–111)
Creatinine, Ser: 0.62 mg/dL (ref 0.44–1.00)
GFR, Estimated: >60 mL/min (ref >60)
Glucose, Bld: 91 mg/dL (ref 70–99)
Potassium: 3.7 mmol/L (ref 3.5–5.1)
Sodium: 138 mmol/L (ref 135–145)

## 2022-02-20 LAB — MAGNESIUM: Magnesium: 2.2 mg/dL (ref 1.7–2.4)

## 2022-02-20 LAB — PHOSPHORUS: Phosphorus: 4.1 mg/dL (ref 2.5–4.6)

## 2022-02-20 MED ORDER — SENNOSIDES-DOCUSATE SODIUM 8.6-50 MG PO TABS: 1.0000 | ORAL_TABLET | Freq: Every evening | ORAL | Status: DC | PRN

## 2022-02-20 MED ORDER — ENOXAPARIN SODIUM 40 MG/0.4ML IJ SOSY
40.0000 mg | PREFILLED_SYRINGE | INTRAMUSCULAR | Status: DC
  Administered 2022-02-20 – 2022-02-24 (×4): 40 mg via SUBCUTANEOUS
  Filled 2022-02-20 (×4): qty 0.4

## 2022-02-20 MED ORDER — HYDROMORPHONE HCL 1 MG/ML IJ SOLN
0.5000 mg | INTRAMUSCULAR | Status: DC | PRN
  Administered 2022-02-20 (×2): 0.5 mg via INTRAVENOUS
  Filled 2022-02-20 (×2): qty 0.5

## 2022-02-20 MED ORDER — ACETAMINOPHEN 650 MG RE SUPP: 650.0000 mg | Freq: Four times a day (QID) | RECTAL | Status: DC | PRN

## 2022-02-20 MED ORDER — METOPROLOL SUCCINATE ER 25 MG PO TB24
25.0000 mg | ORAL_TABLET | Freq: Every day | ORAL | Status: DC
  Administered 2022-02-20 – 2022-02-24 (×5): 25 mg via ORAL
  Filled 2022-02-20 (×5): qty 1

## 2022-02-20 MED ORDER — DIPHENHYDRAMINE HCL 50 MG/ML IJ SOLN
12.5000 mg | Freq: Four times a day (QID) | INTRAMUSCULAR | Status: DC | PRN
  Administered 2022-02-20 – 2022-02-22 (×8): 12.5 mg via INTRAVENOUS
  Filled 2022-02-20 (×9): qty 1

## 2022-02-20 MED ORDER — HYDROMORPHONE HCL 1 MG/ML IJ SOLN
1.0000 mg | INTRAMUSCULAR | Status: DC | PRN
  Administered 2022-02-20 – 2022-02-23 (×12): 1 mg via INTRAVENOUS
  Filled 2022-02-20 (×12): qty 1

## 2022-02-20 MED ORDER — ACETAMINOPHEN 325 MG PO TABS: 650.0000 mg | ORAL_TABLET | Freq: Four times a day (QID) | ORAL | Status: DC | PRN

## 2022-02-20 NOTE — Progress Notes (Signed)
Raenette Rover, NP was notified of current pain  level and that the pt feels that the Dilaudid helped her more with pain control. RN was aware of how the pain had affected her pain when it was under control with the Dilaudid vs with the Morphine, by way of her having a higher BP reading when the pain was not in control. Shared all of this information with her. New orders received.

## 2022-02-20 NOTE — H&P (View-Only) (Signed)
Reason for Consult: Epigastric pain and abnormal CT scan Referring Physician: [facility]  Emma Stephens HPI: This is a 51 year old female with a PMH of GERD, HTN, PAF, and sleep apnea admitted for an acute onset of severe epigastric pain.  She states that her symptoms was mild at first, but it rapidly increased in severity.  The pain radiated to her back and to her sites.  Because of the severe pain, she presented to the ER.  A CT scan of the abdomen was performed and a thickening in was noted in the antrum.  There was an associated stranding and some free fluid in the abdomen.  With treatment her pain improved from a 10/10 down to a 4/10, but she is still very uncomfortable.  There is a component of back spasms.  She has a history of GERD and one month ago it was very severe.  Prior to that time she was taking pantoprazole QAM for mild symptoms.  Another medication was prescribed, in addition to the pantoprazole, but her GERD remitted spontaneously.    Past Medical History:  Diagnosis Date   Anemia    Anxiety    Blood transfusion without reported diagnosis    Cigarette nicotine dependence    GERD (gastroesophageal reflux disease)    Gout 2011   Hypertension    PAF (paroxysmal atrial fibrillation) (HCC)    PVC (premature ventricular contraction)    Sleep apnea    not on cpap at this time 01-17-20    Past Surgical History:  Procedure Laterality Date   NECK SURGERY     TUBAL LIGATION      Family History  Problem Relation Age of Onset   Diabetes Mother    Hypertension Mother    Cancer Father 106       oral   Diabetes Sister    Hypertension Sister    Diabetes Brother    Hypertension Brother    Breast cancer Paternal Grandmother    Sudden Cardiac Death Neg Hx    Heart attack Neg Hx    Colon cancer Neg Hx    Esophageal cancer Neg Hx    Rectal cancer Neg Hx    Stomach cancer Neg Hx     Social History:  reports that she quit smoking about 3 months ago. Her smoking use  included cigarettes. She has a 15.50 pack-year smoking history. She has never used smokeless tobacco. She reports that she does not drink alcohol and does not use drugs.  Allergies:  Allergies  Allergen Reactions   Lisinopril Hives, Swelling and Other (See Comments)    Angioedema and facial swelling   Bupropion Itching and Other (See Comments)    Medications: Scheduled:  diltiazem  240 mg Oral Daily   enoxaparin (LOVENOX) injection  40 mg Subcutaneous Q24H   metoprolol succinate  25 mg Oral Daily   pantoprazole (PROTONIX) IV  40 mg Intravenous Q12H   sertraline  50 mg Oral Daily   Continuous:  0.9 % NaCl with KCl 20 mEq / L 100 mL/hr at 02/20/22 0943   lactated ringers Stopped (02/19/22 1750)    Results for orders placed or performed during the hospital encounter of 02/19/22 (from the past 24 hour(s))  HIV Antibody (routine testing w rflx)     Status: None   Collection Time: 02/20/22  5:06 AM  Result Value Ref Range   HIV Screen 4th Generation wRfx Non Reactive Non Reactive  Basic metabolic panel     Status:  Abnormal   Collection Time: 02/20/22  5:06 AM  Result Value Ref Range   Sodium 138 135 - 145 mmol/L   Potassium 3.7 3.5 - 5.1 mmol/L   Chloride 104 98 - 111 mmol/L   CO2 27 22 - 32 mmol/L   Glucose, Bld 91 70 - 99 mg/dL   BUN 11 6 - 20 mg/dL   Creatinine, Ser 0.62 0.44 - 1.00 mg/dL   Calcium 8.6 (L) 8.9 - 10.3 mg/dL   GFR, Estimated >60 >60 mL/min   Anion gap 7 5 - 15  Magnesium     Status: None   Collection Time: 02/20/22  5:06 AM  Result Value Ref Range   Magnesium 2.2 1.7 - 2.4 mg/dL  Phosphorus     Status: None   Collection Time: 02/20/22  5:06 AM  Result Value Ref Range   Phosphorus 4.1 2.5 - 4.6 mg/dL  CBC     Status: Abnormal   Collection Time: 02/20/22  6:30 AM  Result Value Ref Range   WBC 8.7 4.0 - 10.5 K/uL   RBC 3.99 3.87 - 5.11 MIL/uL   Hemoglobin 11.1 (L) 12.0 - 15.0 g/dL   HCT 34.9 (L) 36.0 - 46.0 %   MCV 87.5 80.0 - 100.0 fL   MCH 27.8 26.0  - 34.0 pg   MCHC 31.8 30.0 - 36.0 g/dL   RDW 14.5 11.5 - 15.5 %   Platelets 357 150 - 400 K/uL   nRBC 0.0 0.0 - 0.2 %     CT ABDOMEN PELVIS W CONTRAST  Result Date: 02/19/2022 CLINICAL DATA:  Epigastric pain EXAM: CT ABDOMEN AND PELVIS WITH CONTRAST TECHNIQUE: Multidetector CT imaging of the abdomen and pelvis was performed using the standard protocol following bolus administration of intravenous contrast. RADIATION DOSE REDUCTION: This exam was performed according to the departmental dose-optimization program which includes automated exposure control, adjustment of the mA and/or kV according to patient size and/or use of iterative reconstruction technique. CONTRAST:  131m OMNIPAQUE IOHEXOL 300 MG/ML  SOLN COMPARISON:  None Available. FINDINGS: Lower chest: No acute abnormality. Hepatobiliary: 1.9 cm low-density lesion with peripheral nodular enhancement in the posterior right hepatic lobe with progressive fill-in on delayed phase imaging. Features are compatible with a hepatic hemangioma. The liver is otherwise normal in appearance. No additional lesions. Unremarkable gallbladder. No hyperdense gallstone. No biliary dilatation. Pancreas: Unremarkable. No pancreatic ductal dilatation or surrounding inflammatory changes. Spleen: Normal in size without focal abnormality. Adrenals/Urinary Tract: Unremarkable adrenal glands. Kidneys enhance symmetrically without solid lesion, stone, or hydronephrosis. Ureters are nondilated. Urinary bladder appears unremarkable for the degree of distention. Stomach/Bowel: Thickened, edematous appearance of the gastric antrum with mild adjacent fat stranding and trace free fluid (series 2, image 33). This segment is mildly dilated. Asymmetric submucosal edema along the lesser curvature of the distal gastric body (series 5, image 68) with luminal narrowing of the stomach at this location, which could be related to peristalsis. No hiatal hernia. No dilated loops of bowel. Normal  appendix in the right lower quadrant. No colonic wall thickening or inflammatory changes. Scattered colonic diverticulosis. Vascular/Lymphatic: Aortic atherosclerosis. No enlarged abdominal or pelvic lymph nodes. Reproductive: Uterus and bilateral adnexa are unremarkable. Other: No ascites. No abdominopelvic fluid collection. No pneumoperitoneum. No abdominal wall hernia. Musculoskeletal: No acute or significant osseous findings. IMPRESSION: 1. Thickened, edematous appearance of the gastric antrum with mild adjacent fat stranding and trace free fluid. Findings are suggestive of gastritis and/or peptic ulcer disease. Somewhat asymmetric appearance of the mucosal  edema along the lesser curvature may be reactive. Underlying neoplasm would be difficult to exclude. Close clinical follow-up as well as a follow-up endoscopy are recommended. 2. Colonic diverticulosis without evidence of acute diverticulitis. 3. Benign 1.9 cm hepatic hemangioma. 4. Aortic atherosclerosis (ICD10-I70.0). Electronically Signed   By: Davina Poke D.O.   On: 02/19/2022 14:36    ROS:  As stated above in the HPI otherwise negative.  Blood pressure (!) 157/80, pulse 66, temperature 98 F (36.7 C), temperature source Oral, resp. rate 16, SpO2 97 %.    PE: Gen: NAD, Alert and Oriented HEENT:  [place]/AT, EOMI Neck: Supple, no LAD Lungs: CTA Bilaterally CV: RRR without M/G/R ABD: Soft, NTND, +BS Ext: No C/C/E  Assessment/Plan: 1) Severe epigastric tenderness. 2) Abnormal CT scan. 3) Mild anemia.   Further evaluation is required with an EGD.  The etiology of her symptoms is currently unknown.  Plan: 1) Continue with supportive care. 2) EGD in the AM.  Talah Cookston D 02/20/2022, 5:05 PM

## 2022-02-20 NOTE — TOC Initial Note (Signed)
Transition of Care [facility]) - Initial/Assessment Note    Patient Details  Name: Emma Stephens MRN: [id number] Date of Birth: October 25, 1970  Transition of [facility]) CM/SW Contact:    Henrietta Dine, RN Phone Number: 02/20/2022, 9:59 AM  Clinical Narrative:                  Transition of Care (TOC) Screening Note   Patient Details  Name: Emma Stephens Date of Birth: 08-07-1970   Transition of Care [facility]) CM/SW Contact:    Henrietta Dine, RN Phone Number: 02/20/2022, 9:59 AM    Transition of Care Department [facility]) has reviewed patient and no TOC needs have been identified at this time. We will continue to monitor patient advancement through interdisciplinary progression rounds. If new patient transition needs arise, please place a TOC consult.          Patient Goals and CMS Choice        Expected Discharge Plan and Services                                                Prior Living Arrangements/Services                       Activities of Daily Living      Permission Sought/Granted                  Emotional Assessment              Admission diagnosis:  Epigastric pain [R10.13] Intractable nausea and vomiting [R11.2] Patient Active Problem List   Diagnosis Date Noted   Intractable nausea and vomiting 02/19/2022   Epigastric pain 02/19/2022   Cervical radiculopathy 12/25/2021   Perimenopausal vasomotor symptoms 11/08/2021   Keloid 07/24/2021   Gastroesophageal reflux disease without esophagitis 08/08/2020   OSA (obstructive sleep apnea) 01/24/2020   Iron deficiency anemia 05/18/2019   Paroxysmal atrial fibrillation ([place]) 06/24/2018   Situational anxiety 12/11/2014   Symptomatic PVCs 10/22/2014   Former tobacco use 10/22/2014   Acute gout 07/30/2014   Essential hypertension, benign 05/27/2014   Recurrent knee pain 05/27/2014   PCP:  Flossie Buffy, NP Pharmacy:   [id number]  [place], [address] [address]Phone: [phone]: [phone] [id number] [place]: [phone]: [phone]   Social Determinants of Health (SDOH) Interventions    Readmission Risk Interventions     No data to display

## 2022-02-20 NOTE — Progress Notes (Signed)
PROGRESS NOTE  Emma Stephens  DOB: [birth year]  PCP: Flossie Buffy, NP SEG:[id number]  DOA: 02/19/2022  LOS: 0 days  Hospital Day: 2  Brief narrative: Emma Stephens is a 51 y.o. female with PMH significant for A-fib, HTN, GERD, anxiety, chronic anemia Patient presented to the ED on 12/1 with complaint of persistent 2 days prior, she developed epigastric pain radiating to the side and back.  Pain persisted, she could not lay flat or get comfortable anyway.  She went to see her PCP and was directed to ED. In the ED, patient was afebrile, hemodynamically stable, breathing room air Initial labs with unremarkable CBC, unremarkable CMP, urinalysis, negative pregnancy test CT abdomen pelvis showed thickened, edematous appearance of the gastric antrum with mild adjacent fat stranding and trace free fluid. Findings suggestive of gastritis and/or peptic ulcer disease. Somewhat asymmetric appearance of the mucosal edema along the lesser curvature may be reactive. Underlying neoplasm would be difficult to exclude.  EGD was recommended. EDP discussed with [facility] on-call who recommended outpatient EGD.  Patient was plan to discharge home but with persistent pain despite IV morphine, patient was admitted to [facility].  Subjective: Patient was seen and examined this morning.  Middle-aged African-American female.  Lying down in bed.  Pain partially controlled.  Her husband and mom at bedside. Chart reviewed Remains hemodynamically stable  Assessment and plan: Acute onset epigastric pain Suspect gastritis, peptic ulcer disease.  CT abdomen however suggested thickened edematous gastric antrum.  Underlying neoplasm to be ruled out Patient is currently on IV Protonix GI consulted to Dr. Benson Norway. Patient and family complains of inadequate pain control.  I increased Dilaudid from 0.5 to 1 mg every 4 hours PRN  Essential hypertension PTA on Toprol 25 mg daily, Cardizem 240 mg daily, losartan 25 mg  daily, Aldactone 25 mg  daily.  Resume Toprol and Cardizem.  Keep Aldactone and losartan on hold  Paroxysmal A-fib Continue Toprol and Cardizem Not on anticoagulation.  Situational anxiety Continue Zoloft   Colonic diverticulosis without evidence of acute diverticulitis Recommend regular bowel habit  Benign 1.9 cm hepatic hemangioma Outpatient follow-up  Goals of care   Code Status: Full Code    Mobility: Encourage ambulation  Infusions:   0.9 % NaCl with KCl 20 mEq / L 100 mL/hr at 02/20/22 0943   lactated ringers Stopped (02/19/22 1750)    Scheduled Meds:  diltiazem  240 mg Oral Daily   enoxaparin (LOVENOX) injection  40 mg Subcutaneous Q24H   metoprolol succinate  25 mg Oral Daily   pantoprazole (PROTONIX) IV  40 mg Intravenous Q12H   sertraline  50 mg Oral Daily    PRN meds: acetaminophen **OR** acetaminophen, diphenhydrAMINE, HYDROmorphone (DILAUDID) injection, senna-docusate   Skin assessment:     Nutritional status:  There is no height or weight on file to calculate BMI.          Diet:  Diet Order             Diet NPO time specified  Diet effective now                   DVT prophylaxis:  enoxaparin (LOVENOX) injection 40 mg Start: 02/20/22 1000 SCDs Start: 02/20/22 0340   Antimicrobials: None Fluid: NS at 100 mill per hour Consultants: GI Family Communication: Husband and mother at bedside  Status is: Observation  Continue in-hospital care because: Pending GI evaluation, may need EGD Level of care: Med-Surg   Dispo: The patient is  from: Home              Anticipated d/c is to: Hopefully home in 1 to 2 days              Patient currently is not medically stable   Difficult to place patient No       Antimicrobials: Anti-infectives (From admission, onward)    None       Objective: Vitals:   02/20/22 0913 02/20/22 1159  BP: (!) 159/79 (!) 157/80  Pulse: 65 66  Resp: 16 16  Temp: (!) 97.5 F (36.4 C) 98 F (36.7  C)  SpO2: 95% 97%    Intake/Output Summary (Last 24 hours) at 02/20/2022 1440 Last data filed at 02/20/2022 1200 Gross per 24 hour  Intake 1308.75 ml  Output 202 ml  Net 1106.75 ml   There were no vitals filed for this visit. Weight change:  There is no height or weight on file to calculate BMI.   Physical Exam: General exam: Pleasant, middle-aged African-American female. inadequately controlled pain Skin: No rashes, lesions or ulcers. HEENT: Atraumatic, normocephalic, no obvious bleeding Lungs: Clear to auscultation bilaterally CVS: Regular rate and rhythm, no murmur GI/Abd soft, mild to moderate diffuse tenderness, bowel sound present CNS: Alert, awake, oriented x 3 Psychiatry: Mood appropriate Extremities: No pedal edema, no calf tenderness  Data Review: I have personally reviewed the laboratory data and studies available.  F/u labs ordered Unresulted Labs (From admission, onward)     Start     Ordered   02/27/22 0500  Creatinine, serum  (enoxaparin (LOVENOX)    CrCl >/= 30 ml/min)  Weekly,   R     Comments: while on enoxaparin therapy    02/20/22 0340            Signed, Terrilee Croak, MD [facility] 02/20/2022

## 2022-02-20 NOTE — Progress Notes (Signed)
[place], NP to let her know that the units (3e and 3w) couldn't take on another patient with cardiac monitoring, at present. The units are at their max between the 2. She asked why were we wanting to put the pt on cardiac monitor. RN explained that this is an order from Dr. Claria Dice. No orders received from Raenette Rover, NP

## 2022-02-20 NOTE — Consult Note (Signed)
Reason for Consult: Epigastric pain and abnormal CT scan Referring Physician: [facility]  Arrie Hoots HPI: This is a 51 year old female with a PMH of GERD, HTN, PAF, and sleep apnea admitted for an acute onset of severe epigastric pain.  She states that her symptoms was mild at first, but it rapidly increased in severity.  The pain radiated to her back and to her sites.  Because of the severe pain, she presented to the ER.  A CT scan of the abdomen was performed and a thickening in was noted in the antrum.  There was an associated stranding and some free fluid in the abdomen.  With treatment her pain improved from a 10/10 down to a 4/10, but she is still very uncomfortable.  There is a component of back spasms.  She has a history of GERD and one month ago it was very severe.  Prior to that time she was taking pantoprazole QAM for mild symptoms.  Another medication was prescribed, in addition to the pantoprazole, but her GERD remitted spontaneously.    Past Medical History:  Diagnosis Date   Anemia    Anxiety    Blood transfusion without reported diagnosis    Cigarette nicotine dependence    GERD (gastroesophageal reflux disease)    Gout 2011   Hypertension    PAF (paroxysmal atrial fibrillation) (HCC)    PVC (premature ventricular contraction)    Sleep apnea    not on cpap at this time 01-17-20    Past Surgical History:  Procedure Laterality Date   NECK SURGERY     TUBAL LIGATION      Family History  Problem Relation Age of Onset   Diabetes Mother    Hypertension Mother    Cancer Father 29       oral   Diabetes Sister    Hypertension Sister    Diabetes Brother    Hypertension Brother    Breast cancer Paternal Grandmother    Sudden Cardiac Death Neg Hx    Heart attack Neg Hx    Colon cancer Neg Hx    Esophageal cancer Neg Hx    Rectal cancer Neg Hx    Stomach cancer Neg Hx     Social History:  reports that she quit smoking about 3 months ago. Her smoking use  included cigarettes. She has a 15.50 pack-year smoking history. She has never used smokeless tobacco. She reports that she does not drink alcohol and does not use drugs.  Allergies:  Allergies  Allergen Reactions   Lisinopril Hives, Swelling and Other (See Comments)    Angioedema and facial swelling   Bupropion Itching and Other (See Comments)    Medications: Scheduled:  diltiazem  240 mg Oral Daily   enoxaparin (LOVENOX) injection  40 mg Subcutaneous Q24H   metoprolol succinate  25 mg Oral Daily   pantoprazole (PROTONIX) IV  40 mg Intravenous Q12H   sertraline  50 mg Oral Daily   Continuous:  0.9 % NaCl with KCl 20 mEq / L 100 mL/hr at 02/20/22 0943   lactated ringers Stopped (02/19/22 1750)    Results for orders placed or performed during the hospital encounter of 02/19/22 (from the past 24 hour(s))  HIV Antibody (routine testing w rflx)     Status: None   Collection Time: 02/20/22  5:06 AM  Result Value Ref Range   HIV Screen 4th Generation wRfx Non Reactive Non Reactive  Basic metabolic panel     Status:  Abnormal   Collection Time: 02/20/22  5:06 AM  Result Value Ref Range   Sodium 138 135 - 145 mmol/L   Potassium 3.7 3.5 - 5.1 mmol/L   Chloride 104 98 - 111 mmol/L   CO2 27 22 - 32 mmol/L   Glucose, Bld 91 70 - 99 mg/dL   BUN 11 6 - 20 mg/dL   Creatinine, Ser 0.62 0.44 - 1.00 mg/dL   Calcium 8.6 (L) 8.9 - 10.3 mg/dL   GFR, Estimated >60 >60 mL/min   Anion gap 7 5 - 15  Magnesium     Status: None   Collection Time: 02/20/22  5:06 AM  Result Value Ref Range   Magnesium 2.2 1.7 - 2.4 mg/dL  Phosphorus     Status: None   Collection Time: 02/20/22  5:06 AM  Result Value Ref Range   Phosphorus 4.1 2.5 - 4.6 mg/dL  CBC     Status: Abnormal   Collection Time: 02/20/22  6:30 AM  Result Value Ref Range   WBC 8.7 4.0 - 10.5 K/uL   RBC 3.99 3.87 - 5.11 MIL/uL   Hemoglobin 11.1 (L) 12.0 - 15.0 g/dL   HCT 34.9 (L) 36.0 - 46.0 %   MCV 87.5 80.0 - 100.0 fL   MCH 27.8 26.0  - 34.0 pg   MCHC 31.8 30.0 - 36.0 g/dL   RDW 14.5 11.5 - 15.5 %   Platelets 357 150 - 400 K/uL   nRBC 0.0 0.0 - 0.2 %     CT ABDOMEN PELVIS W CONTRAST  Result Date: 02/19/2022 CLINICAL DATA:  Epigastric pain EXAM: CT ABDOMEN AND PELVIS WITH CONTRAST TECHNIQUE: Multidetector CT imaging of the abdomen and pelvis was performed using the standard protocol following bolus administration of intravenous contrast. RADIATION DOSE REDUCTION: This exam was performed according to the departmental dose-optimization program which includes automated exposure control, adjustment of the mA and/or kV according to patient size and/or use of iterative reconstruction technique. CONTRAST:  132m OMNIPAQUE IOHEXOL 300 MG/ML  SOLN COMPARISON:  None Available. FINDINGS: Lower chest: No acute abnormality. Hepatobiliary: 1.9 cm low-density lesion with peripheral nodular enhancement in the posterior right hepatic lobe with progressive fill-in on delayed phase imaging. Features are compatible with a hepatic hemangioma. The liver is otherwise normal in appearance. No additional lesions. Unremarkable gallbladder. No hyperdense gallstone. No biliary dilatation. Pancreas: Unremarkable. No pancreatic ductal dilatation or surrounding inflammatory changes. Spleen: Normal in size without focal abnormality. Adrenals/Urinary Tract: Unremarkable adrenal glands. Kidneys enhance symmetrically without solid lesion, stone, or hydronephrosis. Ureters are nondilated. Urinary bladder appears unremarkable for the degree of distention. Stomach/Bowel: Thickened, edematous appearance of the gastric antrum with mild adjacent fat stranding and trace free fluid (series 2, image 33). This segment is mildly dilated. Asymmetric submucosal edema along the lesser curvature of the distal gastric body (series 5, image 68) with luminal narrowing of the stomach at this location, which could be related to peristalsis. No hiatal hernia. No dilated loops of bowel. Normal  appendix in the right lower quadrant. No colonic wall thickening or inflammatory changes. Scattered colonic diverticulosis. Vascular/Lymphatic: Aortic atherosclerosis. No enlarged abdominal or pelvic lymph nodes. Reproductive: Uterus and bilateral adnexa are unremarkable. Other: No ascites. No abdominopelvic fluid collection. No pneumoperitoneum. No abdominal wall hernia. Musculoskeletal: No acute or significant osseous findings. IMPRESSION: 1. Thickened, edematous appearance of the gastric antrum with mild adjacent fat stranding and trace free fluid. Findings are suggestive of gastritis and/or peptic ulcer disease. Somewhat asymmetric appearance of the mucosal  edema along the lesser curvature may be reactive. Underlying neoplasm would be difficult to exclude. Close clinical follow-up as well as a follow-up endoscopy are recommended. 2. Colonic diverticulosis without evidence of acute diverticulitis. 3. Benign 1.9 cm hepatic hemangioma. 4. Aortic atherosclerosis (ICD10-I70.0). Electronically Signed   By: Davina Poke D.O.   On: 02/19/2022 14:36    ROS:  As stated above in the HPI otherwise negative.  Blood pressure (!) 157/80, pulse 66, temperature 98 F (36.7 C), temperature source Oral, resp. rate 16, SpO2 97 %.    PE: Gen: NAD, Alert and Oriented HEENT:  [place]/AT, EOMI Neck: Supple, no LAD Lungs: CTA Bilaterally CV: RRR without M/G/R ABD: Soft, NTND, +BS Ext: No C/C/E  Assessment/Plan: 1) Severe epigastric tenderness. 2) Abnormal CT scan. 3) Mild anemia.   Further evaluation is required with an EGD.  The etiology of her symptoms is currently unknown.  Plan: 1) Continue with supportive care. 2) EGD in the AM.  Siris Hoos D 02/20/2022, 5:05 PM

## 2022-02-21 ENCOUNTER — Observation Stay (HOSPITAL_COMMUNITY): Payer: BC Managed Care – PPO

## 2022-02-21 ENCOUNTER — Observation Stay (HOSPITAL_COMMUNITY): Payer: BC Managed Care – PPO | Admitting: Certified Registered"

## 2022-02-21 ENCOUNTER — Encounter (HOSPITAL_COMMUNITY)
Admission: EM | Disposition: A | Discharge status: Home / Self Care | Payer: Self-pay | Source: Home / Self Care | Attending: Internal Medicine

## 2022-02-21 ENCOUNTER — Encounter (HOSPITAL_COMMUNITY): Payer: Self-pay | Admitting: Internal Medicine

## 2022-02-21 DIAGNOSIS — K760 Fatty (change of) liver, not elsewhere classified: Secondary | ICD-10-CM | POA: Diagnosis not present

## 2022-02-21 DIAGNOSIS — R1013 Epigastric pain: Secondary | ICD-10-CM | POA: Diagnosis not present

## 2022-02-21 DIAGNOSIS — R1011 Right upper quadrant pain: Secondary | ICD-10-CM | POA: Diagnosis not present

## 2022-02-21 HISTORY — PX: ESOPHAGOGASTRODUODENOSCOPY (EGD) WITH PROPOFOL: SHX5813

## 2022-02-21 HISTORY — PX: BIOPSY: SHX5522

## 2022-02-21 SURGERY — ESOPHAGOGASTRODUODENOSCOPY (EGD) WITH PROPOFOL
Anesthesia: Monitor Anesthesia Care

## 2022-02-21 MED ORDER — PROPOFOL 10 MG/ML IV BOLUS
INTRAVENOUS | Status: DC | PRN
  Administered 2022-02-21: 30 mg via INTRAVENOUS

## 2022-02-21 MED ORDER — SPIRONOLACTONE 25 MG PO TABS
25.0000 mg | ORAL_TABLET | Freq: Every day | ORAL | Status: DC
  Administered 2022-02-21 – 2022-02-24 (×3): 25 mg via ORAL
  Filled 2022-02-21 (×4): qty 1

## 2022-02-21 MED ORDER — PROPOFOL 500 MG/50ML IV EMUL
INTRAVENOUS | Status: AC
  Filled 2022-02-21: qty 50

## 2022-02-21 MED ORDER — LACTATED RINGERS IV SOLN
INTRAVENOUS | Status: AC | PRN
  Administered 2022-02-21: 10 mL/h via INTRAVENOUS

## 2022-02-21 MED ORDER — OXYCODONE-ACETAMINOPHEN 5-325 MG PO TABS
1.0000 | ORAL_TABLET | Freq: Four times a day (QID) | ORAL | Status: DC
  Administered 2022-02-21 – 2022-02-23 (×6): 1 via ORAL
  Filled 2022-02-21 (×6): qty 1

## 2022-02-21 MED ORDER — PROPOFOL 500 MG/50ML IV EMUL
INTRAVENOUS | Status: DC | PRN
  Administered 2022-02-21: 125 ug/kg/min via INTRAVENOUS

## 2022-02-21 MED ORDER — LOSARTAN POTASSIUM 25 MG PO TABS
25.0000 mg | ORAL_TABLET | Freq: Every day | ORAL | Status: DC
  Administered 2022-02-21 – 2022-02-24 (×3): 25 mg via ORAL
  Filled 2022-02-21 (×3): qty 1

## 2022-02-21 MED ORDER — LIDOCAINE 2% (20 MG/ML) 5 ML SYRINGE
INTRAMUSCULAR | Status: DC | PRN
  Administered 2022-02-21: 80 mg via INTRAVENOUS

## 2022-02-21 SURGICAL SUPPLY — 15 items

## 2022-02-21 NOTE — Progress Notes (Signed)
PROGRESS NOTE  Emma Stephens  DOB: 06-14-1970  PCP: Flossie Buffy, NP VEL:[id number]  DOA: 02/19/2022  LOS: 0 days  Hospital Day: 3  Brief narrative: Emma Stephens is a 51 y.o. female with PMH significant for A-fib, HTN, GERD, anxiety, chronic anemia Patient presented to the ED on 12/1 with complaint of persistent 2 days prior, she developed epigastric pain radiating to the side and back.  Pain persisted, she could not lay flat or get comfortable anyway.  She went to see her PCP and was directed to ED. In the ED, patient was afebrile, hemodynamically stable, breathing room air Initial labs with unremarkable CBC, unremarkable CMP, urinalysis, negative pregnancy test CT abdomen pelvis showed thickened, edematous appearance of the gastric antrum with mild adjacent fat stranding and trace free fluid. Findings suggestive of gastritis and/or peptic ulcer disease. Somewhat asymmetric appearance of the mucosal edema along the lesser curvature may be reactive. Underlying neoplasm would be difficult to exclude.  EGD was recommended. EDP discussed with [facility] on-call who recommended outpatient EGD.  Patient was plan to discharge home but with persistent pain despite IV morphine, patient was admitted to [facility].  Subjective: Patient was seen and examined this morning.   Lying down in bed. Family at bedside. Underwent EGD this morning.  Found to have gastritis only. On my exam, patient continues to have severe tenderness on right upper quadrant and epigastrium. She is also requiring IV Dilaudid  Assessment and plan: Acute onset abdominal pain CT abdomen and pelvis finding as above.   Seen by GI.  Underwent EGD this morning.  Found to have gastritis only.  Biopsies taken. Her degree of pain is disproportionately high to the findings on EGD.  Since he has right upper quadrant pain as well, I will obtain right upper quadrant ultrasound to rule out hepatobiliary issues.  Tolerated some  regular diet this morning after EGD but did not have appetite  Pain management Patient is requiring IV Dilaudid and IV Benadryl around-the-clock. I will add a scheduled Percocet for next 24 hours hoping to minimize the use of IV pain meds.  Essential hypertension PTA on Toprol 25 mg daily, Cardizem 240 mg daily, losartan 25 mg daily, Aldactone 25 mg  daily.  Currently on Toprol and Cardizem.  Blood pressure rising up.   Resume Aldactone and losartan today.  Paroxysmal A-fib Continue Toprol and Cardizem Not on anticoagulation.  Situational anxiety Continue Zoloft   Colonic diverticulosis without evidence of acute diverticulitis Recommend regular bowel habit  Benign 1.9 cm hepatic hemangioma Outpatient follow-up  Goals of care   Code Status: Full Code    Mobility: Encourage ambulation  Infusions:     Scheduled Meds:  diltiazem  240 mg Oral Daily   enoxaparin (LOVENOX) injection  40 mg Subcutaneous Q24H   losartan  25 mg Oral Daily   metoprolol succinate  25 mg Oral Daily   oxyCODONE-acetaminophen  1 tablet Oral Q6H   pantoprazole (PROTONIX) IV  40 mg Intravenous Q12H   sertraline  50 mg Oral Daily   spironolactone  25 mg Oral Daily    PRN meds: acetaminophen **OR** acetaminophen, diphenhydrAMINE, HYDROmorphone (DILAUDID) injection, senna-docusate   Skin assessment:     Nutritional status:  Body mass index is 30.61 kg/m.          Diet:  Diet Order             Diet regular Room service appropriate? Yes; Fluid consistency: Thin  Diet effective now  DVT prophylaxis:  enoxaparin (LOVENOX) injection 40 mg Start: 02/20/22 1000 SCDs Start: 02/20/22 0340   Antimicrobials: None Fluid: Stop IV hydration.  Encourage oral hydration Consultants: GI Family Communication: Husband and sister at bedside  Status is: Observation  Continue in-hospital care because: Continues to have severe abdominal pain.  Workup in process Level of care:  Med-Surg   Dispo: The patient is from: Home              Anticipated d/c is to: Hopefully home in 1 to 2 days              Patient currently is not medically stable   Difficult to place patient No       Antimicrobials: Anti-infectives (From admission, onward)    None       Objective: Vitals:   02/21/22 0800 02/21/22 0801  BP:  (!) 156/73  Pulse: 65 68  Resp: 10 18  Temp:    SpO2: 100% 100%    Intake/Output Summary (Last 24 hours) at 02/21/2022 1155 Last data filed at 02/21/2022 1012 Gross per 24 hour  Intake 2451.99 ml  Output 1201 ml  Net 1250.99 ml   Filed Weights   02/21/22 0721  Weight: 73.5 kg   Weight change:  Body mass index is 30.61 kg/m.   Physical Exam: General exam: Pleasant, middle-aged African-American female. inadequately controlled pain Skin: No rashes, lesions or ulcers. HEENT: Atraumatic, normocephalic, no obvious bleeding Lungs: Clear to auscultation bilaterally CVS: Regular rate and rhythm, no murmur GI/Abd soft, disproportionately severe tenderness in right upper quadrant and epigastrium, bowel sound present CNS: Alert, awake, oriented x 3 Psychiatry: Sad affect Extremities: No pedal edema, no calf tenderness  Data Review: I have personally reviewed the laboratory data and studies available.  F/u labs ordered Unresulted Labs (From admission, onward)     Start     Ordered   02/27/22 0500  Creatinine, serum  (enoxaparin (LOVENOX)    CrCl >/= 30 ml/min)  Weekly,   R     Comments: while on enoxaparin therapy    02/20/22 0340            Signed, Terrilee Croak, MD [facility] 02/21/2022

## 2022-02-21 NOTE — Transfer of Care (Signed)
Immediate Anesthesia Transfer of Care Note  Patient: Emma Stephens  Procedure(s) Performed: ESOPHAGOGASTRODUODENOSCOPY (EGD) WITH PROPOFOL BIOPSY  Patient Location: PACU  Anesthesia Type:MAC  Level of Consciousness: awake, alert , and oriented  Airway & Oxygen Therapy: Patient Spontanous Breathing and Patient connected to face mask oxygen  Post-op Assessment: Report given to RN and Post -op Vital signs reviewed and stable  Post vital signs: Reviewed and stable  Last Vitals:  Vitals Value Taken Time  BP 139/73 02/21/22 0752  Temp 36.4 C 02/21/22 0752  Pulse 66 02/21/22 0752  Resp 9 02/21/22 0752  SpO2 100 % 02/21/22 0752  Vitals shown include unvalidated device data.  Last Pain:  Vitals:   02/21/22 0752  TempSrc: Temporal  PainSc: Asleep      Patients Stated Pain Goal: 3 (01/21/10 1735)  Complications: No notable events documented.

## 2022-02-21 NOTE — Anesthesia Preprocedure Evaluation (Signed)
Anesthesia Evaluation  Patient identified by MRN, date of birth, ID band Patient awake    Reviewed: Allergy & Precautions, H&P , NPO status , Patient's Chart, lab work & pertinent test results  Airway Mallampati: II  TM Distance: >3 FB Neck ROM: Full    Dental no notable dental hx.    Pulmonary sleep apnea , former smoker   Pulmonary exam normal breath sounds clear to auscultation       Cardiovascular hypertension, Normal cardiovascular exam+ dysrhythmias Atrial Fibrillation  Rhythm:Regular Rate:Normal     Neuro/Psych negative neurological ROS  negative psych ROS   GI/Hepatic Neg liver ROS,GERD  ,,  Endo/Other  negative endocrine ROS    Renal/GU negative Renal ROS  negative genitourinary   Musculoskeletal negative musculoskeletal ROS (+)    Abdominal   Peds negative pediatric ROS (+)  Hematology negative hematology ROS (+)   Anesthesia Other Findings   Reproductive/Obstetrics negative OB ROS                             Anesthesia Physical Anesthesia Plan  ASA: 3  Anesthesia Plan: MAC   Post-op Pain Management: Minimal or no pain anticipated   Induction: Intravenous  PONV Risk Score and Plan: 2 and Propofol infusion and Treatment may vary due to age or medical condition  Airway Management Planned: Simple Face Mask  Additional Equipment:   Intra-op Plan:   Post-operative Plan:   Informed Consent: I have reviewed the patients History and Physical, chart, labs and discussed the procedure including the risks, benefits and alternatives for the proposed anesthesia with the patient or authorized representative who has indicated his/her understanding and acceptance.     Dental advisory given  Plan Discussed with: CRNA and Surgeon  Anesthesia Plan Comments:        Anesthesia Quick Evaluation

## 2022-02-21 NOTE — Progress Notes (Signed)
PT. Has been transported to have EGD completed via wheelchair with staff. PT. Is warm, dry,no visible distress.

## 2022-02-21 NOTE — Anesthesia Postprocedure Evaluation (Signed)
Anesthesia Post Note  Patient: Designer, industrial/product  Procedure(s) Performed: ESOPHAGOGASTRODUODENOSCOPY (EGD) WITH PROPOFOL BIOPSY     Patient location during evaluation: PACU Anesthesia Type: MAC Level of consciousness: awake and alert Pain management: pain level controlled Vital Signs Assessment: post-procedure vital signs reviewed and stable Respiratory status: spontaneous breathing, nonlabored ventilation, respiratory function stable and patient connected to nasal cannula oxygen Cardiovascular status: stable and blood pressure returned to baseline Postop Assessment: no apparent nausea or vomiting Anesthetic complications: no  No notable events documented.  Last Vitals:  Vitals:   02/21/22 0800 02/21/22 0801  BP:  (!) 156/73  Pulse: 65 68  Resp: 10 18  Temp:    SpO2: 100% 100%    Last Pain:  Vitals:   02/21/22 0801  TempSrc:   PainSc: 0-No pain                 Candise Crabtree S

## 2022-02-21 NOTE — Op Note (Signed)
[facility] Patient Name: Emma Stephens Procedure Date: 02/21/2022 MRN: [id number] Attending MD: Carol Ada , MD, [id number] Date of Birth: May 09, 1970 CSN: [id number] Age: 51 Admit Type: Outpatient Procedure:                Upper GI endoscopy Indications:              Epigastric abdominal pain Providers:                Carol Ada, MD, Jeanella Cara, RN,                            Gloris Ham, Technician Referring MD:              Medicines:                Propofol per Anesthesia Complications:            No immediate complications. Estimated Blood Loss:     Estimated blood loss: none. Procedure:                Pre-Anesthesia Assessment:                           - Prior to the procedure, a History and Physical                            was performed, and patient medications and                            allergies were reviewed. The patient's tolerance of                            previous anesthesia was also reviewed. The risks                            and benefits of the procedure and the sedation                            options and risks were discussed with the patient.                            All questions were answered, and informed consent                            was obtained. Prior Anticoagulants: The patient has                            taken no anticoagulant or antiplatelet agents. ASA                            Grade Assessment: II - A patient with mild systemic                            disease. After reviewing the risks and benefits,  the patient was deemed in satisfactory condition to                            undergo the procedure.                           - Sedation was administered by an anesthesia                            professional. Deep sedation was attained.                           After obtaining informed consent, the endoscope was                            passed under  direct vision. Throughout the                            procedure, the patient's blood pressure, pulse, and                            oxygen saturations were monitored continuously. The                            GIF-H190 ([id number]) Olympus endoscope was introduced                            through the mouth, and advanced to the second part                            of duodenum. The upper GI endoscopy was                            accomplished without difficulty. The patient                            tolerated the procedure well. Scope In: Scope Out: Findings:      The esophagus was normal.      Diffuse mild inflammation characterized by erythema was found in the       gastric fundus. Biopsies were taken with a cold forceps for Helicobacter       pylori testing.      The examined duodenum was normal. Impression:               - Normal esophagus.                           - Gastritis. Biopsied.                           - Normal examined duodenum. Moderate Sedation:      Not Applicable - Patient had care per Anesthesia. Recommendation:           - Return patient to hospital ward for ongoing care.                           -  Resume regular diet.                           - Continue present medications.                           - Await pathology results.                           - [facility] will assume care in the AM. Procedure Code(s):        --- Professional ---                           [phone], Esophagogastroduodenoscopy, flexible,                            transoral; with biopsy, single or multiple Diagnosis Code(s):        --- Professional ---                           K29.70, Gastritis, unspecified, without bleeding                           R10.13, Epigastric pain CPT copyright 2022 American Medical Association. All rights reserved. The codes documented in this report are preliminary and upon coder review may  be revised to meet current compliance requirements. Carol Ada, MD Carol Ada, MD 02/21/2022 7:55:04 AM This report has been signed electronically. Number of Addenda: 0

## 2022-02-21 NOTE — Interval H&P Note (Signed)
History and Physical Interval Note:  02/21/2022 7:31 AM  Emma Stephens  has presented today for surgery, with the diagnosis of Epigastric pain and abnormal CT scan..  The various methods of treatment have been discussed with the patient and family. After consideration of risks, benefits and other options for treatment, the patient has consented to  Procedure(s): ESOPHAGOGASTRODUODENOSCOPY (EGD) WITH PROPOFOL (N/A) as a surgical intervention.  The patient's history has been reviewed, patient examined, no change in status, stable for surgery.  I have reviewed the patient's chart and labs.  Questions were answered to the patient's satisfaction.     Evelisse Szalkowski D

## 2022-02-22 ENCOUNTER — Observation Stay (HOSPITAL_COMMUNITY): Payer: BC Managed Care – PPO

## 2022-02-22 DIAGNOSIS — R1013 Epigastric pain: Secondary | ICD-10-CM | POA: Diagnosis not present

## 2022-02-22 DIAGNOSIS — R1011 Right upper quadrant pain: Secondary | ICD-10-CM | POA: Diagnosis not present

## 2022-02-22 MED ORDER — HYDROCORTISONE 1 % EX CREA
TOPICAL_CREAM | CUTANEOUS | Status: DC | PRN
  Filled 2022-02-22: qty 28

## 2022-02-22 MED ORDER — ONDANSETRON HCL 4 MG/2ML IJ SOLN
4.0000 mg | Freq: Four times a day (QID) | INTRAMUSCULAR | Status: DC | PRN
  Administered 2022-02-22: 4 mg via INTRAVENOUS
  Filled 2022-02-22 (×2): qty 2

## 2022-02-22 MED ORDER — TECHNETIUM TC 99M MEBROFENIN IV KIT
5.1600 | PACK | Freq: Once | INTRAVENOUS | Status: AC
  Administered 2022-02-22: 5.16 via INTRAVENOUS

## 2022-02-22 NOTE — Progress Notes (Addendum)
Daily Progress Note  Hospital Day: 4  Chief Complaint: abdominal pain   Brief History 51 y.o. female with a pmh not limited to GERD, HTN, PAF, hepatic hemangioma, diverticulosis, colon polyps, hepatic steatosis,  and sleep apnea . Admitted with severe epigastric pain. Seen by GI on 12/2  Assessment:   # 51 yo female with epigastric pain and abnormal CT scan showing thickened, edematous appearance of the gastric antrum with mild adjacent fat stranding and trace free fluid. Gastritis on EGD yesterday, biopsies pending Today's labs : none  LFTs, lipase normal on admission Pancreas and gallbladder appeared normal on CT scan but US showing a small amount of pericholecystic fluid which could be secondary to the thickening and edema of the gastric antrum.   # Hx of colon polyps. Three year interval colonoscopy due Nov 2024  # Hepatic hemangioma  Plan:    Await gastric biopsies Continue BID PPI Pain out of proportion to gastritis found on EGD.  She is scheduled for a HIDA today  GI Attending: Awaiting HIDA scan as above and lap chole if + Will follow  Gatha Mayer, MD, [facility] See Shea Evans on call - gastroenterology for best contact person 02/22/2022 3:22 PM   Subjective   Not quite as much pain in upper abdomen today. NPO for HIDA.   Objective   Endoscopic studies:   Nov 2021 colonoscopy  - Perianal skin tags found on perianal exam. - Hemorrhoids found on digital rectal exam. - The examined portion of the ileum was normal. - Three 3 to 12 mm polyps in the sigmoid colon, removed with a cold snare. Resected and retrieved. - One 20 mm polyp in the sigmoid colon, removed with mucosal resection. Resected and retrieved. Clips (MR conditional) were placed. - Diverticulosis in the recto-sigmoid colon, in the sigmoid colon and in the descending colon. - Normal mucosa in the entire examined colon otherwise. Diagnosis 1. Surgical [P], colon, sigmoid,  polyp (3) - TUBULAR ADENOMA, NEGATIVE FOR HIGH GRADE DYSPLASIA (X1). HYPERPLASTIC POLYP (X3). 2. Surgical [P], colon, sigmoid, EMR polyp at 21cm - HYPERPLASTIC POLYP.  02/21/22 EGD -normal esophagus -gastritis -normal duodenum  Imaging:  US Abdomen Limited RUQ (LIVER/GB)  Result Date: 02/21/2022 CLINICAL DATA:  Right upper quadrant pain for 4 days EXAM: ULTRASOUND ABDOMEN LIMITED RIGHT UPPER QUADRANT COMPARISON:  CT scan February 19, 2022 FINDINGS: Gallbladder: No gallbladder wall thickening. No stones or sludge. A positive Murphy's sign was reported. A small amount of pericholecystic fluid was reported. Common bile duct: Diameter: 3 mm Liver: Increased echogenicity. No mass identified. Probable focal fatty sparing adjacent to the gallbladder fossa. The hemangioma described on the recent CT scan is not visualized on provided images. Portal vein is patent on color Doppler imaging with normal direction of blood flow towards the liver. Other: None. IMPRESSION: 1. A positive Murphy's sign was reported. Additionally, a small amount of pericholecystic fluid was identified. These findings could be secondary to the thickening and edema of the gastric antrum identified on recent CT imaging thought to represent gastritis and/or peptic ulcer disease. No stones or sludge during identified in the gallbladder. No significant wall thickening. If the clinical picture for acute cholecystitis remains ambiguous, recommend a HIDA scan. 2. Hepatic steatosis. Electronically Signed   By: Dorise Bullion III M.D.   On: 02/21/2022 16:07   CT ABDOMEN PELVIS W CONTRAST  Result Date: 02/19/2022 CLINICAL DATA:  Epigastric pain EXAM: CT ABDOMEN AND PELVIS WITH CONTRAST TECHNIQUE: Multidetector CT  imaging of the abdomen and pelvis was performed using the standard protocol following bolus administration of intravenous contrast. RADIATION DOSE REDUCTION: This exam was performed according to the departmental dose-optimization program  which includes automated exposure control, adjustment of the mA and/or kV according to patient size and/or use of iterative reconstruction technique. CONTRAST:  128m OMNIPAQUE IOHEXOL 300 MG/ML  SOLN COMPARISON:  None Available. FINDINGS: Lower chest: No acute abnormality. Hepatobiliary: 1.9 cm low-density lesion with peripheral nodular enhancement in the posterior right hepatic lobe with progressive fill-in on delayed phase imaging. Features are compatible with a hepatic hemangioma. The liver is otherwise normal in appearance. No additional lesions. Unremarkable gallbladder. No hyperdense gallstone. No biliary dilatation. Pancreas: Unremarkable. No pancreatic ductal dilatation or surrounding inflammatory changes. Spleen: Normal in size without focal abnormality. Adrenals/Urinary Tract: Unremarkable adrenal glands. Kidneys enhance symmetrically without solid lesion, stone, or hydronephrosis. Ureters are nondilated. Urinary bladder appears unremarkable for the degree of distention. Stomach/Bowel: Thickened, edematous appearance of the gastric antrum with mild adjacent fat stranding and trace free fluid (series 2, image 33). This segment is mildly dilated. Asymmetric submucosal edema along the lesser curvature of the distal gastric body (series 5, image 68) with luminal narrowing of the stomach at this location, which could be related to peristalsis. No hiatal hernia. No dilated loops of bowel. Normal appendix in the right lower quadrant. No colonic wall thickening or inflammatory changes. Scattered colonic diverticulosis. Vascular/Lymphatic: Aortic atherosclerosis. No enlarged abdominal or pelvic lymph nodes. Reproductive: Uterus and bilateral adnexa are unremarkable. Other: No ascites. No abdominopelvic fluid collection. No pneumoperitoneum. No abdominal wall hernia. Musculoskeletal: No acute or significant osseous findings. IMPRESSION: 1. Thickened, edematous appearance of the gastric antrum with mild adjacent fat  stranding and trace free fluid. Findings are suggestive of gastritis and/or peptic ulcer disease. Somewhat asymmetric appearance of the mucosal edema along the lesser curvature may be reactive. Underlying neoplasm would be difficult to exclude. Close clinical follow-up as well as a follow-up endoscopy are recommended. 2. Colonic diverticulosis without evidence of acute diverticulitis. 3. Benign 1.9 cm hepatic hemangioma. 4. Aortic atherosclerosis (ICD10-I70.0). Electronically Signed   By: NDavina PokeD.O.   On: 02/19/2022 14:36    Lab Results: Recent Labs    02/19/22 1238 02/20/22 0630  WBC 8.6 8.7  HGB 12.2 11.1*  HCT 37.0 34.9*  PLT 399 357   BMET Recent Labs    02/19/22 1238 02/20/22 0506  NA 137 138  K 3.6 3.7  CL 103 104  CO2 24 27  GLUCOSE 113* 91  BUN 10 11  CREATININE 0.69 0.62  CALCIUM 9.2 8.6*   LFT Recent Labs    02/19/22 1238  PROT 7.7  ALBUMIN 4.2  AST 19  ALT 14  ALKPHOS 86  BILITOT 0.8   PT/INR No results for input(s): "LABPROT", "INR" in the last 72 hours.   Scheduled inpatient medications:   diltiazem  240 mg Oral Daily   enoxaparin (LOVENOX) injection  40 mg Subcutaneous Q24H   losartan  25 mg Oral Daily   metoprolol succinate  25 mg Oral Daily   oxyCODONE-acetaminophen  1 tablet Oral Q6H   pantoprazole (PROTONIX) IV  40 mg Intravenous Q12H   sertraline  50 mg Oral Daily   spironolactone  25 mg Oral Daily   Continuous inpatient infusions:  PRN inpatient medications: acetaminophen **OR** acetaminophen, diphenhydrAMINE, HYDROmorphone (DILAUDID) injection, senna-docusate  Vital signs in last 24 hours: Temp:  [98.1 F (36.7 C)-98.7 F (37.1 C)] 98.1 F (36.7 C) (  12/04 0227) Pulse Rate:  [64-70] 64 (12/04 0227) Resp:  [16-18] 18 (12/04 0227) BP: (114-143)/(71-77) 114/71 (12/04 0227) SpO2:  [95 %-98 %] 97 % (12/04 0227) Last BM Date : 02/19/22  Intake/Output Summary (Last 24 hours) at 02/22/2022 0935 Last data filed at 02/22/2022  0200 Gross per 24 hour  Intake 658.94 ml  Output 1000 ml  Net -341.06 ml    Intake/Output from previous day: 12/03 0701 - 12/04 0700 In: 758.9 [P.O.:590; I.V.:168.9] Out: 1000 [Urine:1000] Intake/Output this shift: No intake/output data recorded.   Physical Exam:  General: Alert female in NAD Heart:  Regular rate and rhythm. No lower extremity edema Pulmonary: Normal respiratory effort Abdomen: Soft, moderate epigastric tenderness, nontender. Normal bowel sounds.  Neurologic: Alert and oriented Psych: Pleasant. Cooperative.    Principal Problem:   Epigastric pain Active Problems:   Essential hypertension, benign   Recurrent knee pain   Acute gout   Symptomatic PVCs   Former tobacco use   Situational anxiety   Paroxysmal atrial fibrillation (HCC)   OSA (obstructive sleep apnea)   Gastroesophageal reflux disease without esophagitis   Intractable nausea and vomiting     LOS: 0 days   Emma Stephens ,NP 02/22/2022, 9:35 AM

## 2022-02-22 NOTE — Consult Note (Signed)
Consulting Physician: Nickola Major Vishnu Moeller  Referring Provider: Dr. Pietro Cassis - [facility]  Chief Complaint: Abdominal pain  Reason for Consult: Possible cholecystitis   Subjective   HPI: Emma Stephens is an 51 y.o. female who is here for abdominal pain.  The pain started on Wednesday prior to admission.  It seems to be focused in the right upper quadrant.  She had pain radiating into the left side, and a band around the back, and up into her chest and left shoulder.  She does remember eating anything particular before this to start this pain.  There are no foods that she avoids at baseline.  She thought it was gas pain at first and she does deal with gas pains, but never this severe.  She has regular colonoscopies and has been found to have polyps.  She has been through a extensive workup here in the hospital including a EGD which demonstrated some gastritis that did not appear significant enough to explain her pain.  Cross-sectional imaging has some inflammation around the area of the duodenum and the gallbladder, so a HIDA scan was ordered neurosurgery consult placed for possible cholecystectomy.  The pain is slowly improving while she is here in the hospital.  Past Medical History:  Diagnosis Date   Anemia    Anxiety    Blood transfusion without reported diagnosis    Cigarette nicotine dependence    GERD (gastroesophageal reflux disease)    Gout 2011   Hypertension    PAF (paroxysmal atrial fibrillation) (HCC)    PVC (premature ventricular contraction)    Sleep apnea    not on cpap at this time 01-17-20    Past Surgical History:  Procedure Laterality Date   NECK SURGERY     TUBAL LIGATION      Family History  Problem Relation Age of Onset   Diabetes Mother    Hypertension Mother    Cancer Father 27       oral   Diabetes Sister    Hypertension Sister    Diabetes Brother    Hypertension Brother    Breast cancer Paternal Grandmother    Sudden Cardiac Death Neg Hx    Heart  attack Neg Hx    Colon cancer Neg Hx    Esophageal cancer Neg Hx    Rectal cancer Neg Hx    Stomach cancer Neg Hx     Social:  reports that she quit smoking about 3 months ago. Her smoking use included cigarettes. She has a 15.50 pack-year smoking history. She has never used smokeless tobacco. She reports that she does not drink alcohol and does not use drugs.  Allergies:  Allergies  Allergen Reactions   Lisinopril Hives, Swelling and Other (See Comments)    Angioedema and facial swelling   Bupropion Itching and Other (See Comments)    Medications: Current Outpatient Medications  Medication Instructions   cholecalciferol (VITAMIN D3) 1,000 Units, Oral, Daily   Cyanocobalamin (VITAMIN B-12 PO) 3,000 mg, Oral, Daily   cyclobenzaprine (FLEXERIL) 5-10 mg, Oral, Daily at bedtime   diltiazem (CARDIZEM CD) 240 mg, Oral, Daily   famotidine (PEPCID) 20 mg, Oral, Daily PRN   losartan (COZAAR) 25 mg, Oral, Every morning   metoprolol succinate (TOPROL-XL) 25 mg, Oral, Daily   pantoprazole (PROTONIX) 40 mg, Oral, Daily   Prenatal Multivit-Min-Fe-FA (PRENATAL 1 + IRON PO) 1 tablet, Oral, Daily   sertraline (ZOLOFT) 50 mg, Oral, Daily   spironolactone (ALDACTONE) 25 mg, Oral, Daily  ROS - all of the below systems have been reviewed with the patient and positives are indicated with bold text General: chills, fever or night sweats Eyes: blurry vision or double vision ENT: epistaxis or sore throat Allergy/Immunology: itchy/watery eyes or nasal congestion Hematologic/Lymphatic: bleeding problems, blood clots or swollen lymph nodes Endocrine: temperature intolerance or unexpected weight changes Breast: new or changing breast lumps or nipple discharge Resp: cough, shortness of breath, or wheezing CV: chest pain or dyspnea on exertion GI: as per HPI GU: dysuria, trouble voiding, or hematuria MSK: joint pain or joint stiffness Neuro: TIA or stroke symptoms Derm: pruritus and skin lesion  changes Psych: anxiety and depression  Objective   PE Blood pressure 114/71, pulse 64, temperature 98.1 F (36.7 C), temperature source Oral, resp. rate 18, height '5\' 1"'$  (1.549 m), weight 73.5 kg, SpO2 97 %. Constitutional: NAD; conversant; no deformities Eyes: Moist conjunctiva; no lid lag; anicteric; PERRL Neck: Trachea midline; no thyromegaly Lungs: Normal respiratory effort; no tactile fremitus CV: RRR; no palpable thrills; no pitting edema GI: Abd soft, tender right upper quadrant; no palpable hepatosplenomegaly MSK: Normal range of motion of extremities; no clubbing/cyanosis Psychiatric: Appropriate affect; alert and oriented x3 Lymphatic: No palpable cervical or axillary lymphadenopathy  No results found for this or any previous visit (from the past 24 hour(s)).   Imaging Orders         CT ABDOMEN PELVIS W CONTRAST         US Abdomen Limited RUQ (LIVER/GB)         NM Hepato W/EF      Assessment and Plan   Emma Stephens is an 51 y.o. female with abdominal pain.  It appears to be out of proportion to the gastritis seen on upper endoscopy.  There is some concern she may have cholecystitis to explain her pain so a HIDA scan has been ordered.  If it is positive for cholecystitis we will take her gallbladder out.  Surgery team will follow along.    Felicie Morn, MD  [facility] Use [email or web address] to contact on call provider  New Patient Billing: [phone] - Moderate MDM

## 2022-02-22 NOTE — Progress Notes (Signed)
PROGRESS NOTE  Courtnay Fiore  DOB: 12-25-1970  PCP: Flossie Buffy, NP OEV:[id number]  DOA: 02/19/2022  LOS: 0 days  Hospital Day: 4  Brief narrative: Emma Stephens is a 51 y.o. female with PMH significant for A-fib, HTN, GERD, anxiety, chronic anemia Patient presented to the ED on 12/1 with complaint of persistent 2 days prior, she developed epigastric pain radiating to the side and back.  Pain persisted, she could not lay flat or get comfortable anyway.  She went to see her PCP and was directed to ED. In the ED, patient was afebrile, hemodynamically stable, breathing room air Initial labs with unremarkable CBC, unremarkable CMP, urinalysis, negative pregnancy test CT abdomen pelvis showed thickened, edematous appearance of the gastric antrum with mild adjacent fat stranding and trace free fluid. Findings suggestive of gastritis and/or peptic ulcer disease. Somewhat asymmetric appearance of the mucosal edema along the lesser curvature may be reactive. Underlying neoplasm would be difficult to exclude.  EGD was recommended. EDP discussed with [facility] on-call who recommended outpatient EGD.  Patient was plan to discharge home but with persistent pain despite IV morphine, patient was admitted to [facility].  Subjective: Patient was seen and examined this morning.   Lying on bed.  Family at bedside.  Still have significant right upper quadrant and epigastric pain.  Assessment and plan: Acute onset abdominal pain Gastritis CT abdomen and pelvis finding as above.   Seen by GI.  12/3, underwent EGD  Found to have gastritis only.  Biopsies taken.  Currently on PPI Her degree of pain is disproportionately high to the findings on EGD.   Right upper quadrant ultrasound was not clearly positive for acute cholecystitis.  Pain remains out of proportion.  HIDA scan is pending.  General surgery consult appreciated. Continue pain management with as needed Percocet, IV Dilaudid as  needed  Essential hypertension PTA on Toprol 25 mg daily, Cardizem 240 mg daily, losartan 25 mg daily, Aldactone 25 mg daily.   Currently continued on all  Paroxysmal A-fib Continue Toprol and Cardizem Not on anticoagulation.  Situational anxiety Continue Zoloft   Colonic diverticulosis without evidence of acute diverticulitis Recommend regular bowel habit  Benign 1.9 cm hepatic hemangioma Outpatient follow-up  Goals of care   Code Status: Full Code    Mobility: Encourage ambulation  Infusions:     Scheduled Meds:  diltiazem  240 mg Oral Daily   enoxaparin (LOVENOX) injection  40 mg Subcutaneous Q24H   losartan  25 mg Oral Daily   metoprolol succinate  25 mg Oral Daily   oxyCODONE-acetaminophen  1 tablet Oral Q6H   pantoprazole (PROTONIX) IV  40 mg Intravenous Q12H   sertraline  50 mg Oral Daily   spironolactone  25 mg Oral Daily    PRN meds: acetaminophen **OR** acetaminophen, diphenhydrAMINE, HYDROmorphone (DILAUDID) injection, senna-docusate   Skin assessment:     Nutritional status:  Body mass index is 30.61 kg/m.          Diet:  Diet Order             Diet regular Room service appropriate? Yes; Fluid consistency: Thin  Diet effective now                   DVT prophylaxis:  enoxaparin (LOVENOX) injection 40 mg Start: 02/20/22 1000 SCDs Start: 02/20/22 0340   Antimicrobials: None Fluid: Not on IV hydration Consultants: GI Family Communication: Husband and sister at bedside.  Husband on the phone  Status is: Observation  Continue in-hospital care because: Continues to have severe abdominal pain.  Workup in process Level of care: Med-Surg   Dispo: The patient is from: Home              Anticipated d/c is to: Hopefully home in 1 to 2 days              Patient currently is not medically stable   Difficult to place patient No       Antimicrobials: Anti-infectives (From admission, onward)    None        Objective: Vitals:   02/21/22 2137 02/22/22 0227  BP: 138/77 114/71  Pulse: 67 64  Resp: 16 18  Temp: 98.7 F (37.1 C) 98.1 F (36.7 C)  SpO2: 98% 97%    Intake/Output Summary (Last 24 hours) at 02/22/2022 1427 Last data filed at 02/22/2022 0200 Gross per 24 hour  Intake 470 ml  Output 1000 ml  Net -530 ml   Filed Weights   02/21/22 0721  Weight: 73.5 kg   Weight change:  Body mass index is 30.61 kg/m.   Physical Exam: General exam: Pleasant, middle-aged African-American female. inadequately controlled pain Skin: No rashes, lesions or ulcers. HEENT: Atraumatic, normocephalic, no obvious bleeding Lungs: Clear to auscultation bilaterally CVS: Regular rate and rhythm, no murmur GI/Abd soft, disproportionately severe tenderness in right upper quadrant and epigastrium, bowel sound present CNS: Alert, awake, oriented x 3 Psychiatry: Sad affect Extremities: No pedal edema, no calf tenderness  Data Review: I have personally reviewed the laboratory data and studies available.  F/u labs ordered Unresulted Labs (From admission, onward)     Start     Ordered   02/27/22 0500  Creatinine, serum  (enoxaparin (LOVENOX)    CrCl >/= 30 ml/min)  Weekly,   R     Comments: while on enoxaparin therapy    02/20/22 0340            Signed, Terrilee Croak, MD [facility] 02/22/2022

## 2022-02-23 ENCOUNTER — Encounter (HOSPITAL_COMMUNITY)
Admission: EM | Disposition: A | Discharge status: Home / Self Care | Payer: Self-pay | Source: Home / Self Care | Attending: Internal Medicine

## 2022-02-23 ENCOUNTER — Encounter (HOSPITAL_COMMUNITY): Payer: Self-pay | Admitting: Internal Medicine

## 2022-02-23 ENCOUNTER — Observation Stay (HOSPITAL_COMMUNITY): Payer: BC Managed Care – PPO | Admitting: Certified Registered"

## 2022-02-23 DIAGNOSIS — Z833 Family history of diabetes mellitus: Secondary | ICD-10-CM | POA: Diagnosis not present

## 2022-02-23 DIAGNOSIS — K828 Other specified diseases of gallbladder: Secondary | ICD-10-CM | POA: Diagnosis present

## 2022-02-23 DIAGNOSIS — Z888 Allergy status to other drugs, medicaments and biological substances status: Secondary | ICD-10-CM | POA: Diagnosis not present

## 2022-02-23 DIAGNOSIS — D649 Anemia, unspecified: Secondary | ICD-10-CM | POA: Diagnosis present

## 2022-02-23 DIAGNOSIS — I493 Ventricular premature depolarization: Secondary | ICD-10-CM | POA: Diagnosis present

## 2022-02-23 DIAGNOSIS — K2970 Gastritis, unspecified, without bleeding: Secondary | ICD-10-CM | POA: Diagnosis present

## 2022-02-23 DIAGNOSIS — K5730 Diverticulosis of large intestine without perforation or abscess without bleeding: Secondary | ICD-10-CM | POA: Diagnosis present

## 2022-02-23 DIAGNOSIS — K219 Gastro-esophageal reflux disease without esophagitis: Secondary | ICD-10-CM | POA: Diagnosis present

## 2022-02-23 DIAGNOSIS — Z8249 Family history of ischemic heart disease and other diseases of the circulatory system: Secondary | ICD-10-CM | POA: Diagnosis not present

## 2022-02-23 DIAGNOSIS — D1803 Hemangioma of intra-abdominal structures: Secondary | ICD-10-CM | POA: Diagnosis present

## 2022-02-23 DIAGNOSIS — G4733 Obstructive sleep apnea (adult) (pediatric): Secondary | ICD-10-CM | POA: Diagnosis present

## 2022-02-23 DIAGNOSIS — K660 Peritoneal adhesions (postprocedural) (postinfection): Secondary | ICD-10-CM | POA: Diagnosis present

## 2022-02-23 DIAGNOSIS — M109 Gout, unspecified: Secondary | ICD-10-CM | POA: Diagnosis present

## 2022-02-23 DIAGNOSIS — F419 Anxiety disorder, unspecified: Secondary | ICD-10-CM | POA: Diagnosis present

## 2022-02-23 DIAGNOSIS — K810 Acute cholecystitis: Secondary | ICD-10-CM | POA: Diagnosis present

## 2022-02-23 DIAGNOSIS — Z86010 Personal history of colonic polyps: Secondary | ICD-10-CM | POA: Diagnosis not present

## 2022-02-23 DIAGNOSIS — R1013 Epigastric pain: Secondary | ICD-10-CM | POA: Diagnosis present

## 2022-02-23 DIAGNOSIS — I10 Essential (primary) hypertension: Secondary | ICD-10-CM | POA: Diagnosis present

## 2022-02-23 DIAGNOSIS — I480 Paroxysmal atrial fibrillation: Secondary | ICD-10-CM | POA: Diagnosis present

## 2022-02-23 DIAGNOSIS — Z79899 Other long term (current) drug therapy: Secondary | ICD-10-CM | POA: Diagnosis not present

## 2022-02-23 DIAGNOSIS — Z87891 Personal history of nicotine dependence: Secondary | ICD-10-CM | POA: Diagnosis not present

## 2022-02-23 DIAGNOSIS — Z803 Family history of malignant neoplasm of breast: Secondary | ICD-10-CM | POA: Diagnosis not present

## 2022-02-23 DIAGNOSIS — K811 Chronic cholecystitis: Secondary | ICD-10-CM | POA: Diagnosis present

## 2022-02-23 HISTORY — PX: CHOLECYSTECTOMY: SHX55

## 2022-02-23 SURGERY — LAPAROSCOPIC CHOLECYSTECTOMY
Anesthesia: General

## 2022-02-23 MED ORDER — OXYCODONE HCL 5 MG PO TABS
ORAL_TABLET | ORAL | Status: AC
  Filled 2022-02-23: qty 1

## 2022-02-23 MED ORDER — PROPOFOL 10 MG/ML IV BOLUS
INTRAVENOUS | Status: DC | PRN
  Administered 2022-02-23: 200 mg via INTRAVENOUS

## 2022-02-23 MED ORDER — LACTATED RINGERS IV SOLN: INTRAVENOUS | Status: DC

## 2022-02-23 MED ORDER — BUPIVACAINE-EPINEPHRINE (PF) 0.5% -1:200000 IJ SOLN
INTRAMUSCULAR | Status: AC
  Filled 2022-02-23: qty 30

## 2022-02-23 MED ORDER — SUGAMMADEX SODIUM 200 MG/2ML IV SOLN
INTRAVENOUS | Status: DC | PRN
  Administered 2022-02-23: 200 mg via INTRAVENOUS
  Administered 2022-02-23: 100 mg via INTRAVENOUS

## 2022-02-23 MED ORDER — GABAPENTIN 100 MG PO CAPS
300.0000 mg | ORAL_CAPSULE | Freq: Three times a day (TID) | ORAL | Status: DC
  Administered 2022-02-23 – 2022-02-24 (×3): 300 mg via ORAL
  Filled 2022-02-23 (×3): qty 3

## 2022-02-23 MED ORDER — OXYCODONE HCL 5 MG/5ML PO SOLN: 5.0000 mg | Freq: Once | ORAL | Status: AC | PRN

## 2022-02-23 MED ORDER — ACETAMINOPHEN 325 MG PO TABS
650.0000 mg | ORAL_TABLET | Freq: Four times a day (QID) | ORAL | Status: DC
  Administered 2022-02-23 – 2022-02-24 (×4): 650 mg via ORAL
  Filled 2022-02-23 (×4): qty 2

## 2022-02-23 MED ORDER — DEXAMETHASONE SODIUM PHOSPHATE 10 MG/ML IJ SOLN
INTRAMUSCULAR | Status: DC | PRN
  Administered 2022-02-23: 8 mg via INTRAVENOUS

## 2022-02-23 MED ORDER — ONDANSETRON HCL 4 MG/2ML IJ SOLN
INTRAMUSCULAR | Status: DC | PRN
  Administered 2022-02-23: 4 mg via INTRAVENOUS

## 2022-02-23 MED ORDER — DEXAMETHASONE SODIUM PHOSPHATE 10 MG/ML IJ SOLN
INTRAMUSCULAR | Status: AC
  Filled 2022-02-23: qty 1

## 2022-02-23 MED ORDER — SODIUM CHLORIDE 0.9 % IV SOLN
2.0000 g | Freq: Once | INTRAVENOUS | Status: AC
  Administered 2022-02-23: 2 g via INTRAVENOUS
  Filled 2022-02-23: qty 20

## 2022-02-23 MED ORDER — CHLORHEXIDINE GLUCONATE 0.12 % MT SOLN
15.0000 mL | Freq: Once | OROMUCOSAL | Status: AC
  Administered 2022-02-23: 15 mL via OROMUCOSAL

## 2022-02-23 MED ORDER — ONDANSETRON HCL 4 MG/2ML IJ SOLN
INTRAMUSCULAR | Status: AC
  Filled 2022-02-23: qty 2

## 2022-02-23 MED ORDER — ROCURONIUM BROMIDE 10 MG/ML (PF) SYRINGE
PREFILLED_SYRINGE | INTRAVENOUS | Status: DC | PRN
  Administered 2022-02-23: 70 mg via INTRAVENOUS

## 2022-02-23 MED ORDER — FENTANYL CITRATE (PF) 100 MCG/2ML IJ SOLN
INTRAMUSCULAR | Status: DC | PRN
  Administered 2022-02-23: 25 ug via INTRAVENOUS
  Administered 2022-02-23 (×3): 50 ug via INTRAVENOUS
  Administered 2022-02-23: 25 ug via INTRAVENOUS

## 2022-02-23 MED ORDER — MIDAZOLAM HCL 2 MG/2ML IJ SOLN
INTRAMUSCULAR | Status: AC
  Filled 2022-02-23: qty 2

## 2022-02-23 MED ORDER — MIDAZOLAM HCL 5 MG/5ML IJ SOLN
INTRAMUSCULAR | Status: DC | PRN
  Administered 2022-02-23: 2 mg via INTRAVENOUS

## 2022-02-23 MED ORDER — DIPHENHYDRAMINE HCL 50 MG/ML IJ SOLN
INTRAMUSCULAR | Status: DC | PRN
  Administered 2022-02-23: 12.5 mg via INTRAVENOUS

## 2022-02-23 MED ORDER — LIDOCAINE HCL (PF) 2 % IJ SOLN
INTRAMUSCULAR | Status: AC
  Filled 2022-02-23: qty 5

## 2022-02-23 MED ORDER — 0.9 % SODIUM CHLORIDE (POUR BTL) OPTIME
TOPICAL | Status: DC | PRN
  Administered 2022-02-23: 1000 mL

## 2022-02-23 MED ORDER — FENTANYL CITRATE (PF) 100 MCG/2ML IJ SOLN
INTRAMUSCULAR | Status: AC
  Filled 2022-02-23: qty 2

## 2022-02-23 MED ORDER — LACTATED RINGERS IR SOLN
Status: DC | PRN
  Administered 2022-02-23: 1000 mL

## 2022-02-23 MED ORDER — HYDROMORPHONE HCL 1 MG/ML IJ SOLN: 0.5000 mg | INTRAMUSCULAR | Status: DC | PRN

## 2022-02-23 MED ORDER — LIDOCAINE 2% (20 MG/ML) 5 ML SYRINGE
INTRAMUSCULAR | Status: DC | PRN
  Administered 2022-02-23: 70 mg via INTRAVENOUS

## 2022-02-23 MED ORDER — ONDANSETRON HCL 4 MG/2ML IJ SOLN: 4.0000 mg | Freq: Once | INTRAMUSCULAR | Status: DC | PRN

## 2022-02-23 MED ORDER — OXYCODONE HCL 5 MG PO TABS
10.0000 mg | ORAL_TABLET | ORAL | Status: DC | PRN
  Administered 2022-02-23 (×2): 10 mg via ORAL
  Filled 2022-02-23 (×3): qty 2

## 2022-02-23 MED ORDER — DOCUSATE SODIUM 100 MG PO CAPS
100.0000 mg | ORAL_CAPSULE | Freq: Two times a day (BID) | ORAL | Status: DC
  Administered 2022-02-23 – 2022-02-24 (×3): 100 mg via ORAL
  Filled 2022-02-23 (×3): qty 1

## 2022-02-23 MED ORDER — OXYCODONE HCL 5 MG PO TABS
5.0000 mg | ORAL_TABLET | ORAL | Status: DC | PRN
  Administered 2022-02-24: 5 mg via ORAL
  Filled 2022-02-23: qty 1

## 2022-02-23 MED ORDER — METHOCARBAMOL 1000 MG/10ML IJ SOLN
500.0000 mg | Freq: Four times a day (QID) | INTRAVENOUS | Status: DC | PRN
  Administered 2022-02-23: 500 mg via INTRAVENOUS
  Filled 2022-02-23: qty 500

## 2022-02-23 MED ORDER — OXYCODONE HCL 5 MG PO TABS
5.0000 mg | ORAL_TABLET | Freq: Once | ORAL | Status: AC | PRN
  Administered 2022-02-23: 5 mg via ORAL

## 2022-02-23 MED ORDER — KETOROLAC TROMETHAMINE 30 MG/ML IJ SOLN: 30.0000 mg | Freq: Once | INTRAMUSCULAR | Status: DC | PRN

## 2022-02-23 MED ORDER — GABAPENTIN 600 MG PO TABS
300.0000 mg | ORAL_TABLET | Freq: Three times a day (TID) | ORAL | Status: DC
  Filled 2022-02-23: qty 0.5

## 2022-02-23 MED ORDER — HYDROMORPHONE HCL 1 MG/ML IJ SOLN: 0.2500 mg | INTRAMUSCULAR | Status: DC | PRN

## 2022-02-23 MED ORDER — BUPIVACAINE-EPINEPHRINE (PF) 0.5% -1:200000 IJ SOLN
INTRAMUSCULAR | Status: DC | PRN
  Administered 2022-02-23: 30 mL

## 2022-02-23 SURGICAL SUPPLY — 40 items
APPLIER CLIP ROT 10 11.4 M/L (STAPLE) ×1
BAG COUNTER SPONGE SURGICOUNT (BAG) IMPLANT
CABLE HIGH FREQUENCY MONO STRZ (ELECTRODE) ×1 IMPLANT
CATH URETL OPEN 5X70 (CATHETERS) IMPLANT
CHLORAPREP W/TINT 26 (MISCELLANEOUS) ×1 IMPLANT
CLIP APPLIE ROT 10 11.4 M/L (STAPLE) ×1 IMPLANT
COVER MAYO STAND XLG (MISCELLANEOUS) ×1 IMPLANT
COVER SURGICAL LIGHT HANDLE (MISCELLANEOUS) ×1 IMPLANT
DERMABOND ADVANCED .7 DNX12 (GAUZE/BANDAGES/DRESSINGS) ×1 IMPLANT
DRAPE C-ARM 42X120 X-RAY (DRAPES) IMPLANT
ELECT REM PT RETURN 15FT ADLT (MISCELLANEOUS) ×1 IMPLANT
ENDOLOOP SUT PDS II  0 18 (SUTURE) ×1
ENDOLOOP SUT PDS II 0 18 (SUTURE) ×1 IMPLANT
GLOVE BIO SURGEON STRL SZ7.5 (GLOVE) ×1 IMPLANT
GLOVE BIOGEL PI IND STRL 8 (GLOVE) ×1 IMPLANT
GOWN STRL REUS W/ TWL XL LVL3 (GOWN DISPOSABLE) ×2 IMPLANT
GOWN STRL REUS W/TWL XL LVL3 (GOWN DISPOSABLE) ×2
GRASPER SUT TROCAR 14GX15 (MISCELLANEOUS) IMPLANT
HEMOSTAT SNOW SURGICEL 2X4 (HEMOSTASIS) IMPLANT
IRRIG SUCT STRYKERFLOW 2 WTIP (MISCELLANEOUS) ×1
IRRIGATION SUCT STRKRFLW 2 WTP (MISCELLANEOUS) ×1 IMPLANT
IV CATH 14GX2 1/4 (CATHETERS) ×1 IMPLANT
KIT BASIN OR (CUSTOM PROCEDURE TRAY) ×1 IMPLANT
KIT TURNOVER KIT A (KITS) IMPLANT
NDL INSUFFLATION 14GA 120MM (NEEDLE) ×1 IMPLANT
NEEDLE INSUFFLATION 14GA 120MM (NEEDLE) ×1 IMPLANT
PENCIL SMOKE EVACUATOR (MISCELLANEOUS) IMPLANT
POUCH RETRIEVAL ECOSAC 10 (ENDOMECHANICALS) ×1 IMPLANT
POUCH RETRIEVAL ECOSAC 10MM (ENDOMECHANICALS) ×1
SCISSORS LAP 5X35 DISP (ENDOMECHANICALS) ×1 IMPLANT
SET TUBE SMOKE EVAC HIGH FLOW (TUBING) ×1 IMPLANT
SLEEVE Z-THREAD 5X100MM (TROCAR) ×2 IMPLANT
SPIKE FLUID TRANSFER (MISCELLANEOUS) ×1 IMPLANT
STOPCOCK 4 WAY LG BORE MALE ST (IV SETS) IMPLANT
SUT MNCRL AB 4-0 PS2 18 (SUTURE) ×1 IMPLANT
TOWEL OR 17X26 10 PK STRL BLUE (TOWEL DISPOSABLE) ×1 IMPLANT
TOWEL OR NON WOVEN STRL DISP B (DISPOSABLE) IMPLANT
TRAY LAPAROSCOPIC (CUSTOM PROCEDURE TRAY) ×1 IMPLANT
TROCAR ADV FIXATION 12X100MM (TROCAR) ×1 IMPLANT
TROCAR Z-THREAD OPTICAL 5X100M (TROCAR) ×1 IMPLANT

## 2022-02-23 NOTE — Anesthesia Postprocedure Evaluation (Signed)
Anesthesia Post Note  Patient: Emma Stephens  Procedure(s) Performed: LAPAROSCOPIC CHOLECYSTECTOMY with Lysis of Adhessions     Patient location during evaluation: PACU Anesthesia Type: General Level of consciousness: awake and alert Pain management: pain level controlled Vital Signs Assessment: post-procedure vital signs reviewed and stable Respiratory status: spontaneous breathing, nonlabored ventilation, respiratory function stable and patient connected to nasal cannula oxygen Cardiovascular status: blood pressure returned to baseline and stable Postop Assessment: no apparent nausea or vomiting Anesthetic complications: no  No notable events documented.  Last Vitals:  Vitals:   02/23/22 0914 02/23/22 1148  BP: (!) 137/90 (!) 190/83  Pulse: 70 66  Resp: 16 11  Temp: 37 C 37.1 C  SpO2: 96% 96%    Last Pain:  Vitals:   02/23/22 1148  TempSrc:   PainSc: Asleep                 Ineze Serrao S

## 2022-02-23 NOTE — Progress Notes (Addendum)
   Lap chole reviewed Signing off Does not need outpatient GI appt at dc  Gatha Mayer, MD, [facility] See Shea Evans on call - gastroenterology for best contact person 02/23/2022 3:08 PM

## 2022-02-23 NOTE — Progress Notes (Signed)
PROGRESS NOTE  Emma Stephens  DOB: 11-28-1970  PCP: Flossie Buffy, NP TXM:[id number]  DOA: 02/19/2022  LOS: 0 days  Hospital Day: 5  Brief narrative: [place] Ke is a 51 y.o. female with PMH significant for A-fib, HTN, GERD, anxiety, chronic anemia Patient presented to the ED on 12/1 with complaint of persistent 2 days prior, she developed epigastric pain radiating to the side and back.  Pain persisted, she could not lay flat or get comfortable anyway.  She went to see her PCP and was directed to ED. In the ED, patient was afebrile, hemodynamically stable, breathing room air Initial labs with unremarkable CBC, unremarkable CMP, urinalysis, negative pregnancy test CT abdomen pelvis showed thickened, edematous appearance of the gastric antrum with mild adjacent fat stranding and trace free fluid. Findings suggestive of gastritis and/or peptic ulcer disease. Somewhat asymmetric appearance of the mucosal edema along the lesser curvature may be reactive. Underlying neoplasm would be difficult to exclude.  EGD was recommended. EDP discussed with [facility] on-call who recommended outpatient EGD.  Patient was plan to discharge home but with persistent pain despite IV morphine, patient was admitted to [facility].  Subjective: Patient was seen and examined this morning.   Waiting for cholecystectomy today.  Pain controlled on IV and oral pain meds. Husband at bedside.  Assessment and plan: Acute onset abdominal pain Gastritis CT abdomen and pelvis finding as above.   Seen by GI.  12/3, underwent EGD  Found to have gastritis only.  Biopsies taken.  Currently on PPI Her degree of pain is disproportionately high to the findings on EGD.   Right upper quadrant ultrasound was not clearly positive for acute cholecystitis.  Pain remains out of proportion.   12/4, HIDA scan showed chronic cholecystitis and dysmotility of gallbladder. Plan for cholecystectomy today Continue pain management with  as needed Percocet, IV Dilaudid as needed  Essential hypertension PTA on Toprol 25 mg daily, Cardizem 240 mg daily, losartan 25 mg daily, Aldactone 25 mg daily.   Currently continued on all  Paroxysmal A-fib Continue Toprol and Cardizem Not on anticoagulation.  Situational anxiety Continue Zoloft   Colonic diverticulosis without evidence of acute diverticulitis Recommend regular bowel habit  Benign 1.9 cm hepatic hemangioma Outpatient follow-up  Goals of care   Code Status: Full Code    Mobility: Encourage ambulation  Infusions:     Scheduled Meds:  diltiazem  240 mg Oral Daily   enoxaparin (LOVENOX) injection  40 mg Subcutaneous Q24H   losartan  25 mg Oral Daily   metoprolol succinate  25 mg Oral Daily   oxyCODONE       oxyCODONE-acetaminophen  1 tablet Oral Q6H   pantoprazole (PROTONIX) IV  40 mg Intravenous Q12H   sertraline  50 mg Oral Daily   spironolactone  25 mg Oral Daily    PRN meds: acetaminophen **OR** acetaminophen, diphenhydrAMINE, hydrocortisone cream, HYDROmorphone (DILAUDID) injection, ondansetron (ZOFRAN) IV, oxyCODONE, senna-docusate   Skin assessment:     Nutritional status:  Body mass index is 30.61 kg/m.          Diet:  Diet Order             Diet NPO time specified  Diet effective midnight                   DVT prophylaxis:  enoxaparin (LOVENOX) injection 40 mg Start: 02/20/22 1000 SCDs Start: 02/20/22 0340   Antimicrobials: None Fluid: Not on IV hydration Consultants: GI Family Communication: Husband  and sister at bedside.  Husband on the phone  Status is: Observation  Continue in-hospital care because: Undergoing cholecystectomy today Level of care: Telemetry   Dispo: The patient is from: Home              Anticipated d/c is to: Hopefully home in 1 to 2 days              Patient currently is not medically stable   Difficult to place patient No       Antimicrobials: Anti-infectives (From admission,  onward)    Start     Dose/Rate Route Frequency Ordered Stop   02/23/22 0915  cefTRIAXone (ROCEPHIN) 2 g in sodium chloride 0.9 % 100 mL IVPB        2 g 200 mL/hr over 30 Minutes Intravenous  Once 02/23/22 0900 02/23/22 1050       Objective: Vitals:   02/23/22 1245 02/23/22 1300  BP: (!) 172/77 (!) 162/87  Pulse: 78 70  Resp: 17 (!) 7  Temp:    SpO2: 98% 90%    Intake/Output Summary (Last 24 hours) at 02/23/2022 1316 Last data filed at 02/23/2022 1123 Gross per 24 hour  Intake 220 ml  Output 405 ml  Net -185 ml   Filed Weights   02/21/22 0721  Weight: 73.5 kg   Weight change:  Body mass index is 30.61 kg/m.   Physical Exam: General exam: Pleasant, middle-aged African-American female. inadequately controlled pain Skin: No rashes, lesions or ulcers. HEENT: Atraumatic, normocephalic, no obvious bleeding Lungs: Clear to auscultation bilaterally. CVS: Regular rate and rhythm, no murmur GI/Abd soft, continues to have severe tenderness in right upper quadrant and epigastrium, bowel sound present CNS: Alert, awake, oriented x 3 Psychiatry: Sad affect Extremities: No pedal edema, no calf tenderness  Data Review: I have personally reviewed the laboratory data and studies available.  F/u labs ordered Unresulted Labs (From admission, onward)     Start     Ordered   02/27/22 0500  Creatinine, serum  (enoxaparin (LOVENOX)    CrCl >/= 30 ml/min)  Weekly,   R     Comments: while on enoxaparin therapy    02/20/22 0340            Signed, Terrilee Croak, MD [facility] 02/23/2022

## 2022-02-23 NOTE — Discharge Instructions (Signed)
[facility] LAPAROSCOPIC SURGERY: POST OP INSTRUCTIONS Always review your discharge instruction sheet given to you by the facility where your surgery was performed. IF YOU HAVE DISABILITY OR FAMILY LEAVE FORMS, YOU MUST BRING THEM TO THE OFFICE FOR PROCESSING.   DO NOT GIVE THEM TO YOUR DOCTOR.  PAIN CONTROL  First take acetaminophen (Tylenol) AND/or ibuprofen (Advil) to control your pain after surgery.  Follow directions on package.  Taking acetaminophen (Tylenol) and/or ibuprofen (Advil) regularly after surgery will help to control your pain and lower the amount of prescription pain medication you may need.  You should not take more than 3,000 mg (3 grams) of acetaminophen (Tylenol) in 24 hours.  You should not take ibuprofen (Advil), aleve, motrin, naprosyn or other NSAIDS if you have a history of stomach ulcers or chronic kidney disease.  A prescription for pain medication may be given to you upon discharge.  Take your pain medication as prescribed, if you still have uncontrolled pain after taking acetaminophen (Tylenol) or ibuprofen (Advil). Use ice packs to help control pain. If you need a refill on your pain medication, please contact your pharmacy.  They will contact our office to request authorization. Prescriptions will not be filled after 5pm or on week-ends.  HOME MEDICATIONS Take your usually prescribed medications unless otherwise directed.  DIET You should follow a light diet the first few days after arrival home.  Be sure to include lots of fluids daily. Avoid fatty, fried foods.   CONSTIPATION It is common to experience some constipation after surgery and if you are taking pain medication.  Increasing fluid intake and taking a stool softener (such as Colace) will usually help or prevent this problem from occurring.  A mild laxative (Milk of Magnesia or Miralax) should be taken according to package instructions if there are no bowel movements after 48  hours.  WOUND/INCISION CARE Most patients will experience some swelling and bruising in the area of the incisions.  Ice packs will help.  Swelling and bruising can take several days to resolve.  Unless discharge instructions indicate otherwise, follow guidelines below  STERI-STRIPS - you may remove your outer bandages 48 hours after surgery, and you may shower at that time.  You have steri-strips (small skin tapes) in place directly over the incision.  These strips should be left on the skin for 7-10 days.   DERMABOND/SKIN GLUE - you may shower in 24 hours.  The glue will flake off over the next 2-3 weeks. Any sutures or staples will be removed at the office during your follow-up visit.  ACTIVITIES You may resume regular (light) daily activities beginning the next day--such as daily self-care, walking, climbing stairs--gradually increasing activities as tolerated.  You may have sexual intercourse when it is comfortable.  Refrain from any heavy lifting or straining until approved by your doctor. You may drive when you are no longer taking prescription pain medication, you can comfortably wear a seatbelt, and you can safely maneuver your car and apply brakes.  FOLLOW-UP You should see your doctor in the office for a follow-up appointment approximately 2-3 weeks after your surgery.  You should have been given your post-op/follow-up appointment when your surgery was scheduled.  If you did not receive a post-op/follow-up appointment, make sure that you call for this appointment within a day or two after you arrive home to insure a convenient appointment time.   WHEN TO CALL YOUR DOCTOR: Fever over 101.0 Inability to urinate Continued bleeding from incision.   Increased pain, redness, or drainage from the incision. Increasing abdominal pain  The clinic staff is available to answer your questions during regular business hours.  Please don't hesitate to call and ask to speak to one of the nurses for  clinical concerns.  If you have a medical emergency, go to the nearest emergency room or call 911.  A surgeon from [facility] is always on call at the hospital. [address], [address], [place], [place]  [address] ? [address] [address], , [place]   [address] [phone] ? [phone] ? FAX [phone] Web site: [email or web address]  

## 2022-02-23 NOTE — Transfer of Care (Signed)
Immediate Anesthesia Transfer of Care Note  Patient: Emma Stephens  Procedure(s) Performed: LAPAROSCOPIC CHOLECYSTECTOMY with Lysis of Adhessions  Patient Location: PACU  Anesthesia Type:General  Level of Consciousness: drowsy  Airway & Oxygen Therapy: Patient Spontanous Breathing and Patient connected to face mask oxygen  Post-op Assessment: Report given to RN and Post -op Vital signs reviewed and stable  Post vital signs: Reviewed and stable  Last Vitals:  Vitals Value Taken Time  BP 190/83 02/23/22 1148  Temp 37.1 C 02/23/22 1148  Pulse 71 02/23/22 1151  Resp 12 02/23/22 1151  SpO2 98 % 02/23/22 1151  Vitals shown include unvalidated device data.  Last Pain:  Vitals:   02/23/22 0914  TempSrc: Oral  PainSc: 5       Patients Stated Pain Goal: 2 (09/81/19 1478)  Complications: No notable events documented.

## 2022-02-23 NOTE — Progress Notes (Signed)
Progress Note  Day of Surgery  Subjective: Pt wanting to proceed with cholecystectomy today. Husband at bedside and questions answered.   Objective: Vital signs in last 24 hours: Temp:  [97.9 F (36.6 C)-98.5 F (36.9 C)] 98.5 F (36.9 C) (12/05 0605) Pulse Rate:  [62-64] 64 (12/05 0605) Resp:  [16-18] 16 (12/05 0605) BP: (130-154)/(69-80) 154/73 (12/05 0605) SpO2:  [95 %-97 %] 95 % (12/05 0605) Last BM Date : 02/19/22  Intake/Output from previous day: 12/04 0701 - 12/05 0700 In: 120 [P.O.:120] Out: 400 [Urine:400] Intake/Output this shift: No intake/output data recorded.  PE: General: pleasant, WD, WN female who is laying in bed in NAD HEENT: sclera anicteric Heart: regular, rate, and rhythm.  Lungs: Respiratory effort nonlabored Abd: Abd soft, tender right upper quadrant; no palpable hepatosplenomegaly  Psych: A&Ox3 with an appropriate affect.    Lab Results:  No results for input(s): "WBC", "HGB", "HCT", "PLT" in the last 72 hours. BMET No results for input(s): "NA", "K", "CL", "CO2", "GLUCOSE", "BUN", "CREATININE", "CALCIUM" in the last 72 hours. PT/INR No results for input(s): "LABPROT", "INR" in the last 72 hours. CMP     Component Value Date/Time   NA 138 02/20/2022 0506   NA 138 02/03/2021 0945   K 3.7 02/20/2022 0506   CL 104 02/20/2022 0506   CO2 27 02/20/2022 0506   GLUCOSE 91 02/20/2022 0506   BUN 11 02/20/2022 0506   BUN 10 02/03/2021 0945   CREATININE 0.62 02/20/2022 0506   CALCIUM 8.6 (L) 02/20/2022 0506   PROT 7.7 02/19/2022 1238   ALBUMIN 4.2 02/19/2022 1238   AST 19 02/19/2022 1238   ALT 14 02/19/2022 1238   ALKPHOS 86 02/19/2022 1238   BILITOT 0.8 02/19/2022 1238   GFRNONAA >60 02/20/2022 0506   GFRAA 114 11/13/2018 1104   Lipase     Component Value Date/Time   LIPASE 26 02/19/2022 1238       Studies/Results: NM Hepato W/EF  Result Date: 02/22/2022 CLINICAL DATA:  Right upper quadrant abdominal pain. EXAM: NUCLEAR  MEDICINE HEPATOBILIARY IMAGING WITH GALLBLADDER EF TECHNIQUE: Sequential images of the abdomen were obtained out to 60 minutes following intravenous administration of radiopharmaceutical. After oral ingestion of Ensure, gallbladder ejection fraction was determined. At 60 min, normal ejection fraction is greater than 33%. RADIOPHARMACEUTICALS:  5.2 mCi Tc-54m Choletec IV COMPARISON:  Ultrasound February 21, 2022 FINDINGS: Prompt uptake and biliary excretion of activity by the liver is seen. Gallbladder activity is visualized, consistent with patency of cystic duct. Biliary activity passes into small bowel, consistent with patent common bile duct. Calculated gallbladder ejection fraction is 16%. (Normal gallbladder ejection fraction with Ensure is greater than 33%.). Patient experience pain post ingestion of Ensure. IMPRESSION: Reduced gallbladder ejection fraction with abdominal pain post ingestion of Ensure, compatible with chronic cholecystitis/biliary dyskinesia. Electronically Signed   By: JDahlia BailiffM.D.   On: 02/22/2022 15:46   [place]domen Limited RUQ (LIVER/GB)  Result Date: 02/21/2022 CLINICAL DATA:  Right upper quadrant pain for 4 days EXAM: ULTRASOUND ABDOMEN LIMITED RIGHT UPPER QUADRANT COMPARISON:  CT scan February 19, 2022 FINDINGS: Gallbladder: No gallbladder wall thickening. No stones or sludge. A positive Murphy's sign was reported. A small amount of pericholecystic fluid was reported. Common bile duct: Diameter: 3 mm Liver: Increased echogenicity. No mass identified. Probable focal fatty sparing adjacent to the gallbladder fossa. The hemangioma described on the recent CT scan is not visualized on provided images. Portal vein is patent on color Doppler imaging  with normal direction of blood flow towards the liver. Other: None. IMPRESSION: 1. A positive Murphy's sign was reported. Additionally, a small amount of pericholecystic fluid was identified. These findings could be secondary to the  thickening and edema of the gastric antrum identified on recent CT imaging thought to represent gastritis and/or peptic ulcer disease. No stones or sludge during identified in the gallbladder. No significant wall thickening. If the clinical picture for acute cholecystitis remains ambiguous, recommend a HIDA scan. 2. Hepatic steatosis. Electronically Signed   By: Dorise Bullion III M.D.   On: 02/21/2022 16:07    Anti-infectives: Anti-infectives (From admission, onward)    None        Assessment/Plan  RUQ abdominal pain  Biliary colic   - s/p EGD with gastritis  - RUQ [place] 12/3 with positive murphy sign and small amount of pericholecystic fluid - HIDA with reduced EF and abdominal pain after ensure - FHx of gallbladder disease - discussed HIDA results with patient and that there is a change pain may not resolve with cholecystectomy but she is willing to proceed -  I have explained the procedure, risks, and aftercare of Laparoscopic cholecystectomy with IOC.  Risks include but are not limited to anesthesia (MI, CVA, death), bleeding, infection, wound problems, hernia, bile leak, injury to common bile duct/liver/intestine, increased risk of DVT/PE and diarrhea post op.  She seems to understand and agrees to proceed.   FEN: NPO VTE: LMWH ID: ordered rocephin for periop abx  LOS: 0 days     Norm Parcel, [facility] 02/23/2022, 8:56 AM Please see Amion for pager number during day hours 7:00am-4:30pm

## 2022-02-23 NOTE — Op Note (Signed)
Patient: Emma Stephens (08-13-1970, [id number])  Date of Surgery: 02/23/22  Preoperative Diagnosis: Cholecystitis   Postoperative Diagnosis: Cholecystitis   Surgical Procedure: LAPAROSCOPIC CHOLECYSTECTOMY with Lysis of Adhessions:    Operative Team Members:  Surgeon(s) and Role:    * Angellina Ferdinand, Nickola Major, MD - Primary   Anesthesiologist: Myrtie Soman, MD CRNA: Cleda Daub, CRNA; Claudia Desanctis, CRNA   Anesthesia: General   Fluids:  Total I/O In: 100 [IV Piggyback:100] Out: 5 [AYTKZ:6]  Complications: None  Drains:   None    Specimen:  ID Type Source Tests Collected by Time Destination  1 : gallbladder Tissue PATH Other SURGICAL PATHOLOGY Aniston Christman, Nickola Major, MD 02/23/2022 1123      Disposition:  PACU - hemodynamically stable.  Plan of Care: Continue inpatient care, possibly home tomorrow    Indications for Procedure: Kember Boch is a 51 y.o. female who presented with abdominal pain.  History, physical and imaging was concerning for cholecystitis.  Laparoscopic cholecystectomy was recommended for the patient.  The procedure itself, as well as the risks, benefits and alternatives were discussed with the patient.  Risks discussed included but were not limited to the risk of infection, bleeding, damage to nearby structures, need to convert to open procedure, incisional hernia, bile leak, common bile duct injury and the need for additional procedures or surgeries.  With this discussion complete and all questions answered the patient granted consent to proceed.  Findings: Adhesions of the omentum to the abdominal wall.  Inflammation of the gallbladder with adhesions to the duodenum consistent with preoperative imaging.    Infection status: Patient: Emma Stephens Emergency General Surgery Service Patient Case: Urgent Infection Present At Time Of Surgery (PATOS): None, inflamed gallbladder   Description of Procedure:   On the date stated above, the  patient was taken to the operating room suite and placed in supine positioning.  Sequential compression devices were placed on the lower extremities to prevent blood clots.  General endotracheal anesthesia was induced. Preoperative antibiotics were given.  The patient's abdomen was prepped and draped in the usual sterile fashion.  A time-out was completed verifying the correct patient, procedure, positioning and equipment needed for the case.  We began by anesthetizing the skin with local anesthetic and then making a 5 mm incision just below the umbilicus.  We dissected through the subcutaneous tissues to the fascia.  The fascia was grasped and elevated using a Kocher clamp.  A Veress needle was inserted into the abdomen and the abdomen was insufflated to 15 mmHg.  A 5 mm trocar was inserted in this position under optical guidance and then the abdomen was inspected.  There was no trauma to the underlying viscera with initial trocar placement.  Any abnormal findings, other than inflammation in the right upper quadrant, are listed above in the findings section.  Three additional trocars were placed, one 12 mm trocar in the subxiphoid position, one 5 mm trocar in the midline epigastric area and one 77m trocar in the right upper quadrant subcostally.  These were placed under direct vision without any trauma to the underlying viscera.  Prior to placing the umbilical trocar, adhesions between the omentum and abdominal wall were divided using laparoscopic shears.  The patient was then placed in head up, left side down positioning.  The gallbladder was identified and dissected free from its attachments to the omentum allowing the duodenum to fall away. There were dense adhesions between the gallbladder and the first portion of the duodenum which were  divided with cold laparoscopic shears to free the gallbladder. The infundibulum of the gallbladder was dissected free working laterally to medially.  The cystic duct and  cystic artery were dissected free from surrounding connective tissue.  The infundibulum of the gallbladder was dissected off the cystic plate.  A critical view of safety was obtained with the cystic duct and cystic artery being cleared of connective tissues and clearly the only two structures entering into the gallbladder with the liver clearly visible behind.  Clips were then applied to the cystic duct and cystic artery and then these structures were divided.  The gallbladder was dissected off the cystic plate, placed in an endocatch bag and removed from the 12 mm subxiphoid port site.  The clips were inspected and appeared effective.  The cystic plate was inspected and hemostasis was obtained using electrocautery.  A suction irrigator was used to clean the operative field.  Attention was turned to closure.  The 12 mm subxiphoid port site was closed using a 0-vicryl suture on a fascial suture passer.  The abdomen was desufflated.  The skin was closed using 4-0 monocryl and dermabond.  All sponge and needle counts were correct at the conclusion of the case.    Louanna Raw, MD General, Bariatric, & Minimally Invasive Surgery [facility], [place]

## 2022-02-23 NOTE — Anesthesia Procedure Notes (Signed)
Procedure Name: Intubation Date/Time: 02/23/2022 10:45 AM  Performed by: Cleda Daub, CRNAPre-anesthesia Checklist: Patient identified, Emergency Drugs available, Suction available and Patient being monitored Patient Re-evaluated:Patient Re-evaluated prior to induction Oxygen Delivery Method: Circle system utilized Preoxygenation: Pre-oxygenation with 100% oxygen Induction Type: IV induction Ventilation: Mask ventilation without difficulty and Oral airway inserted - appropriate to patient size Laryngoscope Size: Mac and 3 Grade View: Grade II Tube type: Oral Tube size: 7.0 mm Number of attempts: 1 Airway Equipment and Method: Stylet and Oral airway Placement Confirmation: ETT inserted through vocal cords under direct vision, positive ETCO2 and breath sounds checked- equal and bilateral Secured at: 21 cm Tube secured with: Tape Dental Injury: Teeth and Oropharynx as per pre-operative assessment

## 2022-02-23 NOTE — Anesthesia Preprocedure Evaluation (Addendum)
Anesthesia Evaluation  Patient identified by MRN, date of birth, ID band Patient awake    Reviewed: Allergy & Precautions, H&P , NPO status , Patient's Chart, lab work & pertinent test results  Airway Mallampati: III  TM Distance: <3 FB Neck ROM: Full    Dental no notable dental hx.    Pulmonary sleep apnea , former smoker   Pulmonary exam normal breath sounds clear to auscultation       Cardiovascular hypertension, Normal cardiovascular exam Rhythm:Regular Rate:Normal     Neuro/Psych negative neurological ROS  negative psych ROS   GI/Hepatic Neg liver ROS,GERD  ,,  Endo/Other  negative endocrine ROS    Renal/GU negative Renal ROS  negative genitourinary   Musculoskeletal negative musculoskeletal ROS (+)    Abdominal   Peds negative pediatric ROS (+)  Hematology  (+) Blood dyscrasia, anemia   Anesthesia Other Findings   Reproductive/Obstetrics negative OB ROS                             Anesthesia Physical Anesthesia Plan  ASA: 3  Anesthesia Plan: General   Post-op Pain Management: Tylenol PO (pre-op)*   Induction: Intravenous  PONV Risk Score and Plan: 3 and Ondansetron, Dexamethasone, Midazolam and Treatment may vary due to age or medical condition  Airway Management Planned: Oral ETT  Additional Equipment:   Intra-op Plan:   Post-operative Plan: Extubation in OR  Informed Consent: I have reviewed the patients History and Physical, chart, labs and discussed the procedure including the risks, benefits and alternatives for the proposed anesthesia with the patient or authorized representative who has indicated his/her understanding and acceptance.     Dental advisory given  Plan Discussed with: CRNA and Surgeon  Anesthesia Plan Comments:        Anesthesia Quick Evaluation

## 2022-02-24 ENCOUNTER — Encounter (HOSPITAL_COMMUNITY): Payer: Self-pay | Admitting: Surgery

## 2022-02-24 ENCOUNTER — Telehealth: Payer: Self-pay | Admitting: Nurse Practitioner

## 2022-02-24 DIAGNOSIS — R1013 Epigastric pain: Secondary | ICD-10-CM | POA: Diagnosis not present

## 2022-02-24 LAB — BASIC METABOLIC PANEL
Anion gap: 10 (ref 5–15)
BUN: 9 mg/dL (ref 6–20)
CO2: 28 mmol/L (ref 22–32)
Calcium: 9.5 mg/dL (ref 8.9–10.3)
Chloride: 99 mmol/L (ref 98–111)
Creatinine, Ser: 0.68 mg/dL (ref 0.44–1.00)
GFR, Estimated: >60 mL/min (ref >60)
Glucose, Bld: 128 mg/dL — ABNORMAL HIGH (ref 70–99)
Potassium: 4.2 mmol/L (ref 3.5–5.1)
Sodium: 137 mmol/L (ref 135–145)

## 2022-02-24 LAB — CBC WITH DIFFERENTIAL/PLATELET
Abs Immature Granulocytes: 0.03 10*3/uL (ref 0.00–0.07)
Basophils Absolute: 0 10*3/uL (ref 0.0–0.1)
Basophils Relative: 0 %
Eosinophils Absolute: 0 10*3/uL (ref 0.0–0.5)
Eosinophils Relative: 0 %
HCT: 38.1 % (ref 36.0–46.0)
Hemoglobin: 12.2 g/dL (ref 12.0–15.0)
Immature Granulocytes: 0 %
Lymphocytes Relative: 13 %
Lymphs Abs: 1.2 10*3/uL (ref 0.7–4.0)
MCH: 27.7 pg (ref 26.0–34.0)
MCHC: 32 g/dL (ref 30.0–36.0)
MCV: 86.6 fL (ref 80.0–100.0)
Monocytes Absolute: 0.4 10*3/uL (ref 0.1–1.0)
Monocytes Relative: 5 %
Neutro Abs: 7.8 10*3/uL — ABNORMAL HIGH (ref 1.7–7.7)
Neutrophils Relative %: 82 %
Platelets: 505 10*3/uL — ABNORMAL HIGH (ref 150–400)
RBC: 4.4 MIL/uL (ref 3.87–5.11)
RDW: 13.4 % (ref 11.5–15.5)
WBC: 9.5 10*3/uL (ref 4.0–10.5)
nRBC: 0 % (ref 0.0–0.2)

## 2022-02-24 LAB — SURGICAL PATHOLOGY

## 2022-02-24 MED ORDER — VITAMIN B-12 1000 MCG PO TABS: 1000.0000 ug | ORAL_TABLET | Freq: Every day | ORAL | Status: DC

## 2022-02-24 MED ORDER — OXYCODONE HCL 5 MG PO TABS: 5.0000 mg | ORAL_TABLET | Freq: Four times a day (QID) | ORAL | 0 refills | Status: DC | PRN

## 2022-02-24 MED ORDER — ACETAMINOPHEN 325 MG PO TABS: 650.0000 mg | ORAL_TABLET | Freq: Four times a day (QID) | ORAL | Status: DC

## 2022-02-24 NOTE — Telephone Encounter (Signed)
Caller Name: Bettyanne Dittman Call back phone #: [phone]  Reason for Call: Pt recently had gallbladder removed. She is wanting to speak with the nurse to see if there are any recommendations and if she needs to be seen by [facility]

## 2022-02-24 NOTE — Progress Notes (Signed)
Pt and mother given d/c paperwork and all questions answered. Pt given FMLA paperwork and work note. Pt taken via wheelchair to front entrance by NT.

## 2022-02-24 NOTE — Progress Notes (Signed)
Progress Note: General Surgery Service   Chief Complaint/Subjective: Doing well post op.  Pain improved.  Objective: Vital signs in last 24 hours: Temp:  [97.8 F (36.6 C)-99 F (37.2 C)] 98.1 F (36.7 C) (12/06 0551) Pulse Rate:  [64-84] 64 (12/06 0551) Resp:  [7-18] 16 (12/06 0551) BP: (137-190)/(68-90) 137/68 (12/06 0551) SpO2:  [90 %-100 %] 98 % (12/06 0551) Last BM Date : 02/19/22  Intake/Output from previous day: 12/05 0701 - 12/06 0700 In: 753.3 [P.O.:360; I.V.:238.3; IV Piggyback:155] Out: 205 [Urine:200; Blood:5] Intake/Output this shift: No intake/output data recorded.  GI: Abd Incisions c/d/I w/ glue, incisional tenderness  Lab Results: CBC  Recent Labs    02/24/22 0458  WBC 9.5  HGB 12.2  HCT 38.1  PLT 505*   BMET Recent Labs    02/24/22 0458  NA 137  K 4.2  CL 99  CO2 28  GLUCOSE 128*  BUN 9  CREATININE 0.68  CALCIUM 9.5   PT/INR No results for input(s): "LABPROT", "INR" in the last 72 hours. ABG No results for input(s): "PHART", "HCO3" in the last 72 hours.  Invalid input(s): "PCO2", "PO2"  Anti-infectives: Anti-infectives (From admission, onward)    Start     Dose/Rate Route Frequency Ordered Stop   02/23/22 0915  cefTRIAXone (ROCEPHIN) 2 g in sodium chloride 0.9 % 100 mL IVPB        2 g 200 mL/hr over 30 Minutes Intravenous  Once 02/23/22 0900 02/23/22 1900       Medications: Scheduled Meds:  acetaminophen  650 mg Oral Q6H   diltiazem  240 mg Oral Daily   docusate sodium  100 mg Oral BID   enoxaparin (LOVENOX) injection  40 mg Subcutaneous Q24H   gabapentin  300 mg Oral TID   losartan  25 mg Oral Daily   metoprolol succinate  25 mg Oral Daily   pantoprazole (PROTONIX) IV  40 mg Intravenous Q12H   sertraline  50 mg Oral Daily   spironolactone  25 mg Oral Daily   Continuous Infusions:  methocarbamol (ROBAXIN) IV Stopped (02/24/22 0544)   PRN Meds:.diphenhydrAMINE, hydrocortisone cream, HYDROmorphone (DILAUDID) injection,  HYDROmorphone (DILAUDID) injection, methocarbamol (ROBAXIN) IV, ondansetron (ZOFRAN) IV, oxyCODONE, oxyCODONE, senna-docusate  Assessment/Plan: s/p Procedure(s): LAPAROSCOPIC CHOLECYSTECTOMY with Lysis of Adhessions 02/23/2022  Doing well post op.  Okay for discharge home.  Follow up with me in office.   LOS: 1 day     Felicie Morn, [facility] Use [email or web address] to contact on call provider  Daily Billing: [phone] - post op

## 2022-02-24 NOTE — Discharge Summary (Signed)
Physician Discharge Summary  Shellene Sweigert OYD:[id number] DOB: [birth year] DOA: 02/19/2022  PCP: Flossie Buffy, NP  Admit date: 02/19/2022 Discharge date: 02/24/2022  Admitted From: Home Discharge disposition: Home  Recommendations at discharge:  Continue as needed pain meds Follow-up with general surgery as an outpatient  Brief narrative: Emma Stephens is a 51 y.o. female with PMH significant for A-fib, HTN, GERD, anxiety, chronic anemia Patient presented to the ED on 12/1 with complaint of persistent 2 days prior, she developed epigastric pain radiating to the side and back.  Pain persisted, she could not lay flat or get comfortable anyway.  She went to see her PCP and was directed to ED. In the ED, patient was afebrile, hemodynamically stable, breathing room air Initial labs with unremarkable CBC, unremarkable CMP, urinalysis, negative pregnancy test CT abdomen pelvis showed thickened, edematous appearance of the gastric antrum with mild adjacent fat stranding and trace free fluid. Findings suggestive of gastritis and/or peptic ulcer disease. Somewhat asymmetric appearance of the mucosal edema along the lesser curvature may be reactive. Underlying neoplasm would be difficult to exclude.  EGD was recommended. EDP discussed with [facility] on-call who recommended outpatient EGD.  Patient was plan to discharge home but with persistent pain despite IV morphine, patient was admitted to [facility]. 12/5, underwent laparoscopic cholecystectomy with lysis of additions See below for details  Subjective: Patient was seen and examined this morning.   Waiting for cholecystectomy today.  Pain controlled on IV and oral pain meds. Husband at bedside.  Assessment and plan: Acute onset abdominal pain Gastritis CT abdomen and pelvis finding as above.   Seen by GI.  12/3, underwent EGD  Found to have gastritis only.  Biopsies taken.   However her degree of pain remained disproportionately  high compared to the findings on EGD.   Right upper quadrant ultrasound was not positive for acute cholecystitis.   12/4, HIDA scan showed chronic cholecystitis and dysmotility of gallbladder. General surgery was consulted 12/6, patient underwent laparoscopic cholecystectomy with lysis of adhesions. Feels much better at 3.  Pain controlled on Percocet, as needed IV Dilaudid.  Essential hypertension PTA on Toprol 25 mg daily, Cardizem 240 mg daily, losartan 25 mg daily, Aldactone 25 mg daily.   Currently continued on all  Paroxysmal A-fib Continue Toprol and Cardizem Not on anticoagulation.  Situational anxiety Continue Zoloft   Colonic diverticulosis without evidence of acute diverticulitis Recommend regular bowel habit  Benign 1.9 cm hepatic hemangioma Outpatient follow-up  Wounds:  - Incision - 4 Ports Abdomen 1: Right 2: Medial;Upper 3: Medial;Mid 4: Umbilicus (Active)  Placement Date: 02/23/22   Location of Ports: Abdomen  Port: 1:  Location Orientation: Right  Port: 2:  Location Orientation: Medial;Upper  Port: 3:  Location Orientation: Medial;Mid  Port: 4:  Location Orientation: Umbilicus    Assessments 02/23/2022 11:27 AM 02/23/2022  8:38 PM  Port 1 Site Assessment -- [employer] 1 Margins -- Attached edges (approximated)  Port 1 Drainage Amount -- None  Port 1 Dressing Type Liquid skin adhesive Liquid skin adhesive  Port 1 Dressing Status -- Clean, Dry, Intact  Port 2 Site Assessment -- Clean;Dry  Port 2 Margins -- Attached edges (approximated)  Port 2 Drainage Amount -- None  Port 2 Dressing Type Liquid skin adhesive Liquid skin adhesive  Port 2 Dressing Status -- Clean, Dry, Intact  Port 3 Site Assessment -- Clean;Dry  Port 3 Margins -- Attached edges (approximated)  Port 3 Drainage Amount -- None  Port  3 Dressing Type Liquid skin adhesive Liquid skin adhesive  Port 4 Site Assessment -- Clean;Dry  Port 4 Margins -- Attached edges (approximated)  Port 4  Drainage Amount -- None  Port 4 Dressing Type Liquid skin adhesive Liquid skin adhesive     No associated orders.    Discharge Exam:   Vitals:   02/23/22 1619 02/23/22 2106 02/24/22 0551 02/24/22 0908  BP: 139/85 (!) 142/84 137/68 (!) 140/83  Pulse: 84 81 64 70  Resp: '16 18 16 16  '$ Temp: 99 F (37.2 C) 98.3 F (36.8 C) 98.1 F (36.7 C) 98.3 F (36.8 C)  TempSrc:  Oral Oral Oral  SpO2: 96% 93% 98% 96%  Weight:      Height:        Body mass index is 30.61 kg/m.  General exam: Pleasant, middle-aged African-American female. inadequately controlled pain Skin: No rashes, lesions or ulcers. HEENT: Atraumatic, normocephalic, no obvious bleeding Lungs: Clear to auscultation bilaterally. CVS: Regular rate and rhythm, no murmur GI/Abd soft, only mild right upper quadrant tenderness today, appropriate postop, bowel sound present  CNS: Alert, awake, oriented x 3 Psychiatry: Sad affect Extremities: No pedal edema, no calf tenderness  Follow ups:    Follow-up Information     Surgery, [place]. Go on 03/16/2022.   Specialty: General Surgery Why: 11:15 AM. Please arrive 30 min prior to appointment time. Contact information: [address] [place] [address] [phone]         Nche, Charlene Brooke, NP Follow up.   Specialty: Internal Medicine Contact information: [place] [address] [phone]                 Discharge Instructions:   Discharge Instructions     Call MD for:  difficulty breathing, headache or visual disturbances   Complete by: As directed    Call MD for:  extreme fatigue   Complete by: As directed    Call MD for:  hives   Complete by: As directed    Call MD for:  persistant dizziness or light-headedness   Complete by: As directed    Call MD for:  persistant nausea and vomiting   Complete by: As directed    Call MD for:  severe uncontrolled pain   Complete by: As directed    Call MD for:   temperature >100.4   Complete by: As directed    Diet general   Complete by: As directed    Discharge instructions   Complete by: As directed    Recommendations at discharge:   Continue as needed pain meds  Follow-up with general surgery as an outpatient  General discharge instructions: Follow with Primary MD Nche, Charlene Brooke, NP in 7 days  Please request your PCP  to go over your hospital tests, procedures, radiology results at the follow up. Please get your medicines reviewed and adjusted.  Your PCP may decide to repeat certain labs or tests as needed. Do not drive, operate heavy machinery, perform activities at heights, swimming or participation in water activities or provide baby sitting services if your were admitted for syncope or siezures until you have seen by Primary MD or a Neurologist and advised to do so again. [place] Controlled Substance Reporting System database was reviewed. Do not drive, operate heavy machinery, perform activities at heights, swim, participate in water activities or provide baby-sitting services while on medications for pain, sleep and mood until your outpatient physician has reevaluated  you and advised to do so again.  You are strongly recommended to comply with the dose, frequency and duration of prescribed medications. Activity: As tolerated with Full fall precautions use walker/cane & assistance as needed Avoid using any recreational substances like cigarette, tobacco, alcohol, or non-prescribed drug. If you experience worsening of your admission symptoms, develop shortness of breath, life threatening emergency, suicidal or homicidal thoughts you must seek medical attention immediately by calling 911 or calling your MD immediately  if symptoms less severe. You must read complete instructions/literature along with all the possible adverse reactions/side effects for all the medicines you take and that have been prescribed to you. Take any new medicine  only after you have completely understood and accepted all the possible adverse reactions/side effects.  Wear Seat belts while driving. You were cared for by a hospitalist during your hospital stay. If you have any questions about your discharge medications or the care you received while you were in the hospital after you are discharged, you can call the unit and ask to speak with the hospitalist or the covering physician. Once you are discharged, your primary care physician will handle any further medical issues. Please note that NO REFILLS for any discharge medications will be authorized once you are discharged, as it is imperative that you return to your primary care physician (or establish a relationship with a primary care physician if you do not have one).   Increase activity slowly   Complete by: As directed        Discharge Medications:   Allergies as of 02/24/2022       Reactions   Lisinopril Hives, Swelling, Other (See Comments)   Angioedema and facial swelling   Bupropion Itching, Other (See Comments)        Medication List     STOP taking these medications    cyclobenzaprine 5 MG tablet Commonly known as: FLEXERIL       TAKE these medications    acetaminophen 325 MG tablet Commonly known as: TYLENOL Take 2 tablets (650 mg total) by mouth every 6 (six) hours.   cholecalciferol 25 MCG (1000 UNIT) tablet Commonly known as: VITAMIN D3 Take 1,000 Units by mouth daily.   cyanocobalamin 1000 MCG tablet Commonly known as: VITAMIN B12 Take 1 tablet (1,000 mcg total) by mouth daily. What changed:  medication strength how much to take   diltiazem 240 MG 24 hr capsule Commonly known as: CARDIZEM CD Take 1 capsule (240 mg total) by mouth daily.   famotidine 20 MG tablet Commonly known as: PEPCID Take 1 tablet (20 mg total) by mouth daily as needed for heartburn or indigestion.   losartan 25 MG tablet Commonly known as: COZAAR Take 1 tablet (25 mg total) by  mouth in the morning.   metoprolol succinate 25 MG 24 hr tablet Commonly known as: TOPROL-XL Take 1 tablet (25 mg total) by mouth daily.   oxyCODONE 5 MG immediate release tablet Commonly known as: Oxy IR/ROXICODONE Take 1 tablet (5 mg total) by mouth every 6 (six) hours as needed for moderate pain.   pantoprazole 40 MG tablet Commonly known as: PROTONIX Take 1 tablet (40 mg total) by mouth daily.   PRENATAL 1 + IRON PO Take 1 tablet by mouth daily.   sertraline 50 MG tablet Commonly known as: ZOLOFT Take 1 tablet (50 mg total) by mouth daily.   spironolactone 25 MG tablet Commonly known as: ALDACTONE Take 1 tablet (25 mg total) by mouth daily.  The results of significant diagnostics from this hospitalization (including imaging, microbiology, ancillary and laboratory) are listed below for reference.    Procedures and Diagnostic Studies:   CT ABDOMEN PELVIS W CONTRAST  Result Date: 02/19/2022 CLINICAL DATA:  Epigastric pain EXAM: CT ABDOMEN AND PELVIS WITH CONTRAST TECHNIQUE: Multidetector CT imaging of the abdomen and pelvis was performed using the standard protocol following bolus administration of intravenous contrast. RADIATION DOSE REDUCTION: This exam was performed according to the departmental dose-optimization program which includes automated exposure control, adjustment of the mA and/or kV according to patient size and/or use of iterative reconstruction technique. CONTRAST:  18m OMNIPAQUE IOHEXOL 300 MG/ML  SOLN COMPARISON:  None Available. FINDINGS: Lower chest: No acute abnormality. Hepatobiliary: 1.9 cm low-density lesion with peripheral nodular enhancement in the posterior right hepatic lobe with progressive fill-in on delayed phase imaging. Features are compatible with a hepatic hemangioma. The liver is otherwise normal in appearance. No additional lesions. Unremarkable gallbladder. No hyperdense gallstone. No biliary dilatation. Pancreas: Unremarkable. No  pancreatic ductal dilatation or surrounding inflammatory changes. Spleen: Normal in size without focal abnormality. Adrenals/Urinary Tract: Unremarkable adrenal glands. Kidneys enhance symmetrically without solid lesion, stone, or hydronephrosis. Ureters are nondilated. Urinary bladder appears unremarkable for the degree of distention. Stomach/Bowel: Thickened, edematous appearance of the gastric antrum with mild adjacent fat stranding and trace free fluid (series 2, image 33). This segment is mildly dilated. Asymmetric submucosal edema along the lesser curvature of the distal gastric body (series 5, image 68) with luminal narrowing of the stomach at this location, which could be related to peristalsis. No hiatal hernia. No dilated loops of bowel. Normal appendix in the right lower quadrant. No colonic wall thickening or inflammatory changes. Scattered colonic diverticulosis. Vascular/Lymphatic: Aortic atherosclerosis. No enlarged abdominal or pelvic lymph nodes. Reproductive: Uterus and bilateral adnexa are unremarkable. Other: No ascites. No abdominopelvic fluid collection. No pneumoperitoneum. No abdominal wall hernia. Musculoskeletal: No acute or significant osseous findings. IMPRESSION: 1. Thickened, edematous appearance of the gastric antrum with mild adjacent fat stranding and trace free fluid. Findings are suggestive of gastritis and/or peptic ulcer disease. Somewhat asymmetric appearance of the mucosal edema along the lesser curvature may be reactive. Underlying neoplasm would be difficult to exclude. Close clinical follow-up as well as a follow-up endoscopy are recommended. 2. Colonic diverticulosis without evidence of acute diverticulitis. 3. Benign 1.9 cm hepatic hemangioma. 4. Aortic atherosclerosis (ICD10-I70.0). Electronically Signed   By: NDavina PokeD.O.   On: 02/19/2022 14:36     Labs:   Basic Metabolic Panel: Recent Labs  Lab 02/19/22 1238 02/20/22 0506 02/24/22 0458  NA 137 138  137  K 3.6 3.7 4.2  CL 103 104 99  CO2 '24 27 28  '$ GLUCOSE 113* 91 128*  BUN '10 11 9  '$ CREATININE 0.69 0.62 0.68  CALCIUM 9.2 8.6* 9.5  MG  --  2.2  --   PHOS  --  4.1  --    GFR Estimated Creatinine Clearance: 75.4 mL/min (by C-G formula based on SCr of 0.68 mg/dL). Liver Function Tests: Recent Labs  Lab 02/19/22 1238  AST 19  ALT 14  ALKPHOS 86  BILITOT 0.8  PROT 7.7  ALBUMIN 4.2   Recent Labs  Lab 02/19/22 1238  LIPASE 26   No results for input(s): "AMMONIA" in the last 168 hours. Coagulation profile No results for input(s): "INR", "PROTIME" in the last 168 hours.  CBC: Recent Labs  Lab 02/19/22 1238 02/20/22 0630 02/24/22 0458  WBC 8.6 8.7 9.5  NEUTROABS  --   --  7.8*  HGB 12.2 11.1* 12.2  HCT 37.0 34.9* 38.1  MCV 83.5 87.5 86.6  PLT 399 357 505*   Cardiac Enzymes: No results for input(s): "CKTOTAL", "CKMB", "CKMBINDEX", "TROPONINI" in the last 168 hours. BNP: Invalid input(s): "POCBNP" CBG: No results for input(s): "GLUCAP" in the last 168 hours. D-Dimer No results for input(s): "DDIMER" in the last 72 hours. Hgb A1c No results for input(s): "HGBA1C" in the last 72 hours. Lipid Profile No results for input(s): "CHOL", "HDL", "LDLCALC", "TRIG", "CHOLHDL", "LDLDIRECT" in the last 72 hours. Thyroid function studies No results for input(s): "TSH", "T4TOTAL", "T3FREE", "THYROIDAB" in the last 72 hours.  Invalid input(s): "FREET3" Anemia work up No results for input(s): "VITAMINB12", "FOLATE", "FERRITIN", "TIBC", "IRON", "RETICCTPCT" in the last 72 hours. Microbiology No results found for this or any previous visit (from the past 240 hour(s)).  Time coordinating discharge: 35 minutes  Signed: Antwian Santaana  [facility] 02/24/2022, 12:40 PM

## 2022-02-25 ENCOUNTER — Telehealth: Payer: Self-pay

## 2022-02-25 ENCOUNTER — Encounter (HOSPITAL_COMMUNITY): Payer: Self-pay | Admitting: Gastroenterology

## 2022-02-25 NOTE — Telephone Encounter (Signed)
My Chart message sent

## 2022-02-25 NOTE — Telephone Encounter (Signed)
Transition Care Management Unsuccessful Follow-up Telephone Call  Date of discharge and from where:  02/24/22 [facility]. Dx: Epigastric pain  Attempts:  1st Attempt  Reason for unsuccessful TCM follow-up call:  Left voice message

## 2022-03-02 NOTE — Telephone Encounter (Signed)
Transition Care Management Follow-up Telephone Call Date of discharge and from where: 02/24/22 [facility]. Dx:  How have you been since you were released from the hospital? I'm doing better Any questions or concerns? No  Items Reviewed: Did the pt receive and understand the discharge instructions provided? Yes  Medications obtained and verified? Yes  Other? No  Any new allergies since your discharge? No  Dietary orders reviewed? Yes Do you have support at home? Yes   Home Care and Equipment/Supplies: Were home health services ordered? not applicable If so, what is the name of the agency? N/a  Has the agency set up a time to come to the patient's home? not applicable Were any new equipment or medical supplies ordered?  No What is the name of the medical supply agency? N/a Were you able to get the supplies/equipment? not applicable Do you have any questions related to the use of the equipment or supplies? No  Functional Questionnaire: (I = Independent and D = Dependent) ADLs: I  Bathing/Dressing- I  Meal Prep- I  Eating- I  Maintaining continence- I  Transferring/Ambulation- I  Managing Meds- I  Follow up appointments reviewed:  PCP Hospital f/u appt confirmed? Yes  Scheduled to see Wilfred Lacy on 03/12/22 @ 12:00pm. [place] f/u appt confirmed? No  Scheduled to see n/a on n/a @ n/a. Are transportation arrangements needed? No  If their condition worsens, is the pt aware to call PCP or go to the Emergency Dept.? Yes Was the patient provided with contact information for the PCP's office or ED? Yes Was to pt encouraged to call back with questions or concerns? Yes   Angeline Slim, RN, BSN RN Clinical Supervisor [facility] [employer]

## 2022-03-12 ENCOUNTER — Encounter: Payer: Self-pay | Admitting: Nurse Practitioner

## 2022-03-12 ENCOUNTER — Ambulatory Visit: Payer: BC Managed Care – PPO | Admitting: Nurse Practitioner

## 2022-03-12 ENCOUNTER — Telehealth: Payer: Self-pay | Admitting: Nurse Practitioner

## 2022-03-12 VITALS — BP 122/62 | HR 77 | Temp 97.5°F | Ht 61.0 in | Wt 165.0 lb

## 2022-03-12 DIAGNOSIS — K2970 Gastritis, unspecified, without bleeding: Secondary | ICD-10-CM

## 2022-03-12 DIAGNOSIS — R1013 Epigastric pain: Secondary | ICD-10-CM

## 2022-03-12 DIAGNOSIS — Z9049 Acquired absence of other specified parts of digestive tract: Secondary | ICD-10-CM

## 2022-03-12 DIAGNOSIS — B9681 Helicobacter pylori [H. pylori] as the cause of diseases classified elsewhere: Secondary | ICD-10-CM

## 2022-03-12 MED ORDER — METRONIDAZOLE 500 MG PO TABS: 500.0000 mg | ORAL_TABLET | Freq: Two times a day (BID) | ORAL | 0 refills | Status: DC

## 2022-03-12 MED ORDER — AMOXICILLIN 500 MG PO CAPS: 1000.0000 mg | ORAL_CAPSULE | Freq: Two times a day (BID) | ORAL | 0 refills | Status: AC

## 2022-03-12 NOTE — Telephone Encounter (Signed)
Caller Name: Lisett Dirusso Call back phone #: [phone]  Reason for Call: Pt was advised to f/u in 2 weeks (Return in about 2 weeks (around 03/26/2022) for gastritis. ) The first available in 1/19 which is 4 weeks out. Can we fit her in earlier, or is she okay to wait?

## 2022-03-12 NOTE — Patient Instructions (Signed)
Start amoxicillin and metronidazole x 14days. Maintain omeprazole dose Go to lab  Gastritis, Adult Gastritis is inflammation of the stomach. There are two kinds of gastritis: Acute gastritis. This kind develops suddenly. Chronic gastritis. This kind is much more common. It develops slowly and lasts for a long time. Gastritis happens when the lining of the stomach becomes weak or gets damaged. Without treatment, gastritis can lead to stomach bleeding and ulcers. What are the causes? This condition may be caused by: An infection. Drinking too much alcohol. Certain medicines. These include steroids, antibiotics, and some over-the-counter medicines, such as aspirin or ibuprofen. Having too much acid in the stomach. Having a disease of the stomach. Other causes may include: An allergic reaction. Some cancer treatments (radiation). Smoking cigarettes or the use of products that contain nicotine or tobacco. In some cases, the cause of this condition is not known. What increases the risk? Having a disease of the intestines. Having a disease in which the body's immune system attacks the body (autoimmune disease), such as Crohn's disease. Using aspirin or ibuprofen and other NSAIDs to treat other conditions, such as heart disease or chronic pain. Stress. What are the signs or symptoms? Symptoms of this condition include: Pain or a burning sensation in the upper abdomen. Nausea. Vomiting. An uncomfortable feeling of fullness after eating. Weight loss. Bad breath. Blood in your vomit or stool (feces). In some cases, there are no symptoms. How is this diagnosed? This condition may be diagnosed based on your medical history, a physical exam, and tests. Tests may include: Your medical history and a description of your symptoms. A physical exam. Tests. These can include: Blood tests. Stool tests. A test in which a thin, flexible instrument with a light and a camera is passed down the  esophagus and into the stomach (upper endoscopy). A test in which a tissue sample is removed to look at it under a microscope (biopsy). How is this treated? This condition may be treated with medicines. The medicines that are used vary depending on the cause of the gastritis. If the condition is caused by a bacterial infection, you may be given antibiotic medicines. If the condition is caused by too much acid in the stomach, you may be given medicines called H2 blockers, proton pump inhibitors, or antacids. Treatment may also involve stopping the use of certain medicines such as aspirin or ibuprofen and other NSAIDs. Follow these instructions at home: Medicines Take over-the-counter and prescription medicines only as told by your health care provider. If you were prescribed an antibiotic medicine, take it as told by your health care provider. Do not stop taking the antibiotic even if you start to feel better. Alcohol use Do not drink alcohol if: Your health care provider tells you not to drink. You are pregnant, may be pregnant, or are planning to become pregnant. If you drink alcohol: Limit your use to: 0-1 drink a day for women. 0-2 drinks a day for men. Know how much alcohol is in your drink. In the [place] one drink equals one 12 oz bottle of beer (355 mL), one 5 oz glass of wine (148 mL), or one 1 oz glass of hard liquor (44 mL). General instructions  Eat small, frequent meals instead of large meals. Avoid foods and drinks that make your symptoms worse. Talk with your health care provider about ways to manage stress, such as getting regular exercise or practicing deep breathing, meditation, or yoga. Do not use any products that contain nicotine or  tobacco. These products include cigarettes, chewing tobacco, and vaping devices, such as e-cigarettes. If you need help quitting, ask your health care provider. Drink enough fluid to keep your urine pale yellow. Keep all follow-up visits.  This is important. Contact a health care provider if: Your symptoms get worse. Your abdominal pain gets worse. Your symptoms return after treatment. You have a fever. Get help right away if: You vomit blood or a substance that looks like coffee grounds. You have black or dark red stools. You are unable to keep fluids down. These symptoms may represent a serious problem that is an emergency. Do not wait to see if the symptoms will go away. Get medical help right away. Call your local emergency services (911 in the [place] Do not drive yourself to the hospital. Summary Gastritis is inflammation of the lining of the stomach that can occur suddenly (acute) or develop slowly over time (chronic). This condition is diagnosed with a medical history, a physical exam, or tests. This condition may be treated with medicines to treat infection or medicines to reduce the amount of acid in your stomach. Follow your health care provider's instructions about taking medicines, making changes to your diet, and knowing when to call for help. This information is not intended to replace advice given to you by your health care provider. Make sure you discuss any questions you have with your health care provider. Document Revised: 07/12/2020 Document Reviewed: 07/12/2020 Elsevier Patient Education  [place].

## 2022-03-12 NOTE — Progress Notes (Unsigned)
Established Patient Visit  Patient: Emma Stephens   DOB: Aug 18, 1970   51 y.o. Female  MRN: [id number] Visit Date: 03/14/2022  Subjective:    Chief Complaint  Patient presents with   [place] F/u  Says she is doing well, but had pain "here and there"   HPI Helicobacter pylori gastritis Persistent nausea with food intake Per EDG completed 02/21/2022:Active chronic gastritis with associated Helicobacter pylori  infection (H. pylori immunohistochemical (IHC) stain examined, with satisfactory controls).  Negative for intestinal metaplasia and malignancy.   Maintain pantoprazole dose Add metronidazole and amoxicillin Unable to use clarithromycin due to DDI with diltiazem  Hx of cholecystectomy Surgical pathology: Chronic cholecystitis Persistent nausea and ABD bloating with food intake. Denies any constipation, no fever.  Check cbc, cmp and lipase Has upcoming f/up appt with surgeon in 2weeks Advised about the imporatnce of deep breathing exercises with incentive spirometry  Reviewed medical, surgical, and social history today  Medications: Outpatient Medications Prior to Visit  Medication Sig   acetaminophen (TYLENOL) 325 MG tablet Take 2 tablets (650 mg total) by mouth every 6 (six) hours.   cholecalciferol (VITAMIN D3) 25 MCG (1000 UT) tablet Take 1,000 Units by mouth daily.   cyanocobalamin (VITAMIN B12) 1000 MCG tablet Take 1 tablet (1,000 mcg total) by mouth daily.   diltiazem (CARDIZEM CD) 240 MG 24 hr capsule Take 1 capsule (240 mg total) by mouth daily.   losartan (COZAAR) 25 MG tablet Take 1 tablet (25 mg total) by mouth in the morning.   metoprolol succinate (TOPROL-XL) 25 MG 24 hr tablet Take 1 tablet (25 mg total) by mouth daily.   oxyCODONE (OXY IR/ROXICODONE) 5 MG immediate release tablet Take 1 tablet (5 mg total) by mouth every 6 (six) hours as needed for moderate pain.   pantoprazole (PROTONIX) 40 MG tablet Take 1 tablet (40  mg total) by mouth daily.   Prenatal Multivit-Min-Fe-FA (PRENATAL 1 + IRON PO) Take 1 tablet by mouth daily.   sertraline (ZOLOFT) 50 MG tablet Take 1 tablet (50 mg total) by mouth daily.   spironolactone (ALDACTONE) 25 MG tablet Take 1 tablet (25 mg total) by mouth daily.   [DISCONTINUED] famotidine (PEPCID) 20 MG tablet Take 1 tablet (20 mg total) by mouth daily as needed for heartburn or indigestion. (Patient not taking: Reported on 03/12/2022)   Facility-Administered Medications Prior to Visit  Medication Dose Route Frequency Provider   0.9 %  sodium chloride infusion  500 mL Intravenous Once Mansouraty, Telford Nab., MD   Reviewed past medical and social history.   ROS per HPI above  Last CBC Lab Results  Component Value Date   WBC 9.5 02/24/2022   HGB 12.2 02/24/2022   HCT 38.1 02/24/2022   MCV 86.6 02/24/2022   MCH 27.7 02/24/2022   RDW 13.4 02/24/2022   PLT 505 (H) 35/46/5681   Last metabolic panel Lab Results  Component Value Date   GLUCOSE 128 (H) 02/24/2022   NA 137 02/24/2022   K 4.2 02/24/2022   CL 99 02/24/2022   CO2 28 02/24/2022   BUN 9 02/24/2022   CREATININE 0.68 02/24/2022   GFRNONAA >60 02/24/2022   CALCIUM 9.5 02/24/2022   PHOS 4.1 02/20/2022   PROT 7.7 02/19/2022   ALBUMIN 4.2 02/19/2022   BILITOT 0.8 02/19/2022   ALKPHOS 86 02/19/2022   AST 19 02/19/2022   ALT 14 02/19/2022  ANIONGAP 10 02/24/2022      Objective:  BP 122/62 (BP Location: Right Arm, Patient Position: Sitting, Cuff Size: Normal)   Pulse 77   Temp (!) 97.5 F (36.4 C) (Temporal)   Ht '5\' 1"'$  (1.549 m)   Wt 165 lb (74.8 kg)   SpO2 98%   BMI 31.18 kg/m      Physical Exam Cardiovascular:     Rate and Rhythm: Normal rate and regular rhythm.     Pulses: Normal pulses.     Heart sounds: Normal heart sounds.  Pulmonary:     Effort: Pulmonary effort is normal.     Breath sounds: Normal breath sounds.  Abdominal:     General: Bowel sounds are normal.     Palpations:  Abdomen is soft. There is no mass.     Tenderness: There is abdominal tenderness. There is guarding.     Comments: Surgical incisions are dry and well approximated with surgical glue  Musculoskeletal:     Right lower leg: No edema.     Left lower leg: No edema.  Neurological:     Mental Status: She is alert and oriented to person, place, and time.     No results found for any visits on 03/12/22.    Assessment & Plan:    Problem List Items Addressed This Visit       Digestive   Helicobacter pylori gastritis - Primary    Persistent nausea with food intake Per EDG completed 02/21/2022:Active chronic gastritis with associated Helicobacter pylori  infection (H. pylori immunohistochemical (IHC) stain examined, with satisfactory controls).  Negative for intestinal metaplasia and malignancy.   Maintain pantoprazole dose Add metronidazole and amoxicillin Unable to use clarithromycin due to DDI with diltiazem      Relevant Medications   metroNIDAZOLE (FLAGYL) 500 MG tablet   amoxicillin (AMOXIL) 500 MG capsule     Other   Hx of cholecystectomy    Surgical pathology: Chronic cholecystitis Persistent nausea and ABD bloating with food intake. Denies any constipation, no fever.  Check cbc, cmp and lipase Has upcoming f/up appt with surgeon in 2weeks Advised about the imporatnce of deep breathing exercises with incentive spirometry      Relevant Orders   Comprehensive metabolic panel   Lipase   CBC   Return in about 2 weeks (around 03/26/2022) for gastritis.     Wilfred Lacy, NP

## 2022-03-14 ENCOUNTER — Encounter: Payer: Self-pay | Admitting: Nurse Practitioner

## 2022-03-14 DIAGNOSIS — B9681 Helicobacter pylori [H. pylori] as the cause of diseases classified elsewhere: Secondary | ICD-10-CM | POA: Insufficient documentation

## 2022-03-14 DIAGNOSIS — Z9049 Acquired absence of other specified parts of digestive tract: Secondary | ICD-10-CM | POA: Insufficient documentation

## 2022-03-14 NOTE — Assessment & Plan Note (Addendum)
Surgical pathology: Chronic cholecystitis Persistent nausea and ABD bloating with food intake. Denies any constipation, no fever.  Check cbc, cmp and lipase Has upcoming f/up appt with surgeon in 2weeks Advised about the imporatnce of deep breathing exercises with incentive spirometry

## 2022-03-14 NOTE — Assessment & Plan Note (Addendum)
Persistent nausea with food intake Per EDG completed 02/21/2022:Active chronic gastritis with associated Helicobacter pylori  infection (H. pylori immunohistochemical (IHC) stain examined, with satisfactory controls).  Negative for intestinal metaplasia and malignancy.   Maintain pantoprazole dose Add metronidazole and amoxicillin Unable to use clarithromycin due to DDI with diltiazem

## 2022-03-16 NOTE — Telephone Encounter (Signed)
Left vm to have pt cb

## 2022-03-16 NOTE — Addendum Note (Signed)
Addended by: Beryle Lathe S on: 03/16/2022 09:09 AM   Modules accepted: Orders

## 2022-03-24 ENCOUNTER — Other Ambulatory Visit (INDEPENDENT_AMBULATORY_CARE_PROVIDER_SITE_OTHER): Payer: BC Managed Care – PPO

## 2022-03-24 DIAGNOSIS — B351 Tinea unguium: Secondary | ICD-10-CM

## 2022-03-24 DIAGNOSIS — Z9049 Acquired absence of other specified parts of digestive tract: Secondary | ICD-10-CM

## 2022-03-24 LAB — COMPREHENSIVE METABOLIC PANEL
ALT: 12 U/L (ref 0–35)
AST: 15 U/L (ref 0–37)
Albumin: 4.4 g/dL (ref 3.5–5.2)
Alkaline Phosphatase: 78 U/L (ref 39–117)
BUN: 10 mg/dL (ref 6–23)
CO2: 30 mEq/L (ref 19–32)
Calcium: 9.5 mg/dL (ref 8.4–10.5)
Chloride: 100 mEq/L (ref 96–112)
Creatinine, Ser: 0.79 mg/dL (ref 0.40–1.20)
GFR: 85.67 mL/min (ref >60.00)
Glucose, Bld: 101 mg/dL — ABNORMAL HIGH (ref 70–99)
Potassium: 3.7 mEq/L (ref 3.5–5.1)
Sodium: 140 mEq/L (ref 135–145)
Total Bilirubin: 0.3 mg/dL (ref 0.2–1.2)
Total Protein: 6.9 g/dL (ref 6.0–8.3)

## 2022-03-24 LAB — HEPATIC FUNCTION PANEL
ALT: 12 U/L (ref 0–35)
AST: 15 U/L (ref 0–37)
Albumin: 4.4 g/dL (ref 3.5–5.2)
Alkaline Phosphatase: 78 U/L (ref 39–117)
Bilirubin, Direct: 0.1 mg/dL (ref 0.0–0.3)
Total Bilirubin: 0.3 mg/dL (ref 0.2–1.2)
Total Protein: 6.9 g/dL (ref 6.0–8.3)

## 2022-03-24 LAB — CBC
HCT: 37 % (ref 36.0–46.0)
Hemoglobin: 12.1 g/dL (ref 12.0–15.0)
MCHC: 32.7 g/dL (ref 30.0–36.0)
MCV: 83.8 fl (ref 78.0–100.0)
Platelets: 404 10*3/uL — ABNORMAL HIGH (ref 150.0–400.0)
RBC: 4.42 Mil/uL (ref 3.87–5.11)
RDW: 13.7 % (ref 11.5–15.5)
WBC: 6.7 10*3/uL (ref 4.0–10.5)

## 2022-03-24 LAB — LIPASE: Lipase: 9 U/L — ABNORMAL LOW (ref 11.0–59.0)

## 2022-03-29 ENCOUNTER — Ambulatory Visit: Payer: BC Managed Care – PPO | Admitting: Nurse Practitioner

## 2022-03-29 ENCOUNTER — Encounter: Payer: Self-pay | Admitting: Nurse Practitioner

## 2022-03-29 VITALS — BP 118/76 | HR 76 | Temp 97.3°F | Ht 61.0 in | Wt 165.8 lb

## 2022-03-29 DIAGNOSIS — N760 Acute vaginitis: Secondary | ICD-10-CM | POA: Diagnosis not present

## 2022-03-29 DIAGNOSIS — B9681 Helicobacter pylori [H. pylori] as the cause of diseases classified elsewhere: Secondary | ICD-10-CM

## 2022-03-29 DIAGNOSIS — K2970 Gastritis, unspecified, without bleeding: Secondary | ICD-10-CM

## 2022-03-29 MED ORDER — FLUCONAZOLE 150 MG PO TABS: 150.0000 mg | ORAL_TABLET | Freq: Every day | ORAL | 0 refills | Status: DC

## 2022-03-29 NOTE — Progress Notes (Signed)
Established Patient Visit  Patient: Emma Stephens   DOB: 07-04-1970   52 y.o. Female  MRN: [id number] Visit Date: 03/29/2022  Subjective:    Chief Complaint  Patient presents with   Office Visit    Gastritis F/u  Discuss lab results  Pt not fasting  No concerns    HPI Helicobacter pylori gastritis Resolved ABD pain, ABD bloating, nausea, vomiting and appetite. Currently taking PPI, amoxicillin and metronidazole. Report vaginal itching due to oral abx, no odor, no pelvic pain.  Completed above treatment, hold PPI x 63month then repeat H. Pylori stool. Advised to use famotidine BID prn if needed while PPI is on hold. Sent diflucan tabs for yeast vaginitis.  Reviewed medical, surgical, and social history today  Medications: Outpatient Medications Prior to Visit  Medication Sig   cholecalciferol (VITAMIN D3) 25 MCG (1000 UT) tablet Take 1,000 Units by mouth daily.   cyanocobalamin (VITAMIN B12) 1000 MCG tablet Take 1 tablet (1,000 mcg total) by mouth daily.   diltiazem (CARDIZEM CD) 240 MG 24 hr capsule Take 1 capsule (240 mg total) by mouth daily.   losartan (COZAAR) 25 MG tablet Take 1 tablet (25 mg total) by mouth in the morning.   metoprolol succinate (TOPROL-XL) 25 MG 24 hr tablet Take 1 tablet (25 mg total) by mouth daily.   oxyCODONE (OXY IR/ROXICODONE) 5 MG immediate release tablet Take 1 tablet (5 mg total) by mouth every 6 (six) hours as needed for moderate pain.   pantoprazole (PROTONIX) 40 MG tablet Take 1 tablet (40 mg total) by mouth daily.   sertraline (ZOLOFT) 50 MG tablet Take 1 tablet (50 mg total) by mouth daily.   spironolactone (ALDACTONE) 25 MG tablet Take 1 tablet (25 mg total) by mouth daily.   acetaminophen (TYLENOL) 325 MG tablet Take 2 tablets (650 mg total) by mouth every 6 (six) hours. (Patient not taking: Reported on 03/29/2022)   metroNIDAZOLE (FLAGYL) 500 MG tablet Take 1 tablet (500 mg total) by mouth 2 (two) times daily. (Patient not  taking: Reported on 03/29/2022)   [DISCONTINUED] Prenatal Multivit-Min-Fe-FA (PRENATAL 1 + IRON PO) Take 1 tablet by mouth daily. (Patient not taking: Reported on 03/29/2022)   Facility-Administered Medications Prior to Visit  Medication Dose Route Frequency Provider   0.9 %  sodium chloride infusion  500 mL Intravenous Once Mansouraty, GTelford Nab, MD   Reviewed past medical and social history.   ROS per HPI above      Objective:  BP 118/76 (BP Location: Right Arm, Patient Position: Sitting, Cuff Size: Small)   Pulse 76   Temp (!) 97.3 F (36.3 C) (Temporal)   Ht '5\' 1"'$  (1.549 m)   Wt 165 lb 12.8 oz (75.2 kg)   SpO2 98%   BMI 31.33 kg/m      Physical Exam Vitals reviewed.  Cardiovascular:     Rate and Rhythm: Normal rate.     Pulses: Normal pulses.  Pulmonary:     Effort: Pulmonary effort is normal.  Abdominal:     General: Bowel sounds are normal. There is no distension.     Palpations: Abdomen is soft. There is no mass.     Tenderness: There is no abdominal tenderness. There is no guarding.     Hernia: No hernia is present.  Neurological:     Mental Status: She is alert and oriented to person, place, and time.     No  results found for any visits on 03/29/22.    Assessment & Plan:    Problem List Items Addressed This Visit       Digestive   Helicobacter pylori gastritis    Resolved ABD pain, ABD bloating, nausea, vomiting and appetite. Currently taking PPI, amoxicillin and metronidazole. Report vaginal itching due to oral abx, no odor, no pelvic pain.  Completed above treatment, hold PPI x 46month then repeat H. Pylori stool. Advised to use famotidine BID prn if needed while PPI is on hold. Sent diflucan tabs for yeast vaginitis.      Relevant Medications   fluconazole (DIFLUCAN) 150 MG tablet   Other Relevant Orders   H. pylori antigen, stool   Other Visit Diagnoses     Acute vaginitis    -  Primary   Relevant Medications   fluconazole (DIFLUCAN) 150  MG tablet      Return in about 3 months (around 06/28/2022) for CPE (fasting).     CWilfred Lacy NP

## 2022-03-29 NOTE — Assessment & Plan Note (Signed)
Resolved ABD pain, ABD bloating, nausea, vomiting and appetite. Currently taking PPI, amoxicillin and metronidazole. Report vaginal itching due to oral abx, no odor, no pelvic pain.  Completed above treatment, hold PPI x 48month then repeat H. Pylori stool. Advised to use famotidine BID prn if needed while PPI is on hold. Sent diflucan tabs for yeast vaginitis.

## 2022-03-29 NOTE — Patient Instructions (Signed)
Complete metronidazole as prescribed. Stop pantoprazole after last dose of metronidazole. Schedule lab appt for repeat H. Pylori Stool after hold pantoprazole x 74month Ok to use famotidine/pepcid '20mg'$  twice a day as needed. Call office if GERD symptoms worsen with discontinuation of pantoprazole.  Take first dose to diflucan today Also start monostat vaginal cream x 7days Take last dose of diflucan after last dose of amoxicillin.  Helicobacter Pylori Infection Helicobacter pylori infection is a bacterial infection in the stomach. Long-term (chronic) infection can cause stomach irritation (gastritis), ulcers in the stomach (gastric ulcers), and ulcers in the upper part of the intestine (duodenal ulcers). Having this infection may also increase your risk of stomach cancer and a type of white blood cell cancer (lymphoma) that affects the stomach. What are the causes? This infection is caused by the Helicobacter pylori (H. pylori) bacteria. Many healthy people have this bacteria in their stomach lining. The bacteria may also spread from person to person through contact with stool (feces) or saliva. It is not known why some people develop ulcers, gastritis, or cancer from the bacteria. What increases the risk? You are more likely to develop this condition if you: Have family members with the infection. Live with many other people, such as in a dormitory. What are the signs or symptoms? Most people with this infection do not have any symptoms. If you do have symptoms, they may include: Heartburn. Stomach pain. Nausea. Vomiting. The vomit may be bloody because of ulcers. Loss of appetite. Bad breath. How is this diagnosed? This condition may be diagnosed based on: Your symptoms and medical history. A physical exam. Blood tests. Stool tests. A breath test. A procedure that involves placing a tube with a camera on the end of it down your throat to examine your stomach and upper intestine  (upper endoscopy). Removing and testing a tissue sample from the stomach lining (biopsy). A biopsy may be taken during an upper endoscopy. How is this treated?  This condition is treated by taking a combination of medicines (triple therapy) for several weeks. Triple therapy includes one medicine to reduce the amount of acid in your stomach and two types of antibiotic medicines. This treatment may reduce your risk of cancer. You may need to be tested for H. pylori again after treatment. In some cases, the treatment may need to be repeated if your treatment did not get rid of all the bacteria. Follow these instructions at home:  Take over-the-counter and prescription medicines only as told by your health care provider. Take your antibiotic medicine as told by your health care provider. Do not stop taking the antibiotics even if you start to feel better. Return to your normal activities as told by your health care provider. Ask your health care provider what activities are safe for you. Take steps to prevent future infections: Wash your hands often with soap and water for at least 20 seconds. If soap and water are not available, use hand sanitizer. Do not eat food or drink water that may have had contact with stool or saliva. Keep all follow-up visits. This is important. You may need tests to make sure your treatment worked. Contact a health care provider if your symptoms: Do not get better with treatment. Return after treatment. Summary Helicobacter pylori infection is a stomach infection caused by the Helicobacter pylori (H. pylori) bacteria. This infection can cause stomach irritation (gastritis), ulcers in the stomach (gastric ulcers), and ulcers in the upper part of the intestine (duodenal ulcers). This  condition is treated by taking a combination of medicines (triple therapy) for several weeks. Take your antibiotic medicine as told by your health care provider. Do not stop taking the  antibiotics even if you start to feel better. This information is not intended to replace advice given to you by your health care provider. Make sure you discuss any questions you have with your health care provider. Document Revised: 09/24/2020 Document Reviewed: 09/24/2020 Elsevier Patient Education  [place].

## 2022-04-02 ENCOUNTER — Encounter: Payer: BC Managed Care – PPO | Admitting: Nurse Practitioner

## 2022-04-09 ENCOUNTER — Ambulatory Visit: Payer: BC Managed Care – PPO | Admitting: Nurse Practitioner

## 2022-04-16 DIAGNOSIS — Z1151 Encounter for screening for human papillomavirus (HPV): Secondary | ICD-10-CM | POA: Diagnosis not present

## 2022-04-16 DIAGNOSIS — Z01419 Encounter for gynecological examination (general) (routine) without abnormal findings: Secondary | ICD-10-CM | POA: Diagnosis not present

## 2022-04-16 DIAGNOSIS — Z124 Encounter for screening for malignant neoplasm of cervix: Secondary | ICD-10-CM | POA: Diagnosis not present

## 2022-04-16 LAB — HM PAP SMEAR: HM Pap smear: NORMAL

## 2022-04-19 NOTE — Progress Notes (Deleted)
Cardiology Office Note Date:  04/19/2022  Patient ID:  Emma, [name] Jul 25, 1970, MRN [id number] PCP:  Flossie Buffy, NP  Cardiologist:  Dr. Caryl Comes    Chief Complaint:  *** annual visit  History of Present Illness: Emma Stephens is a 52 y.o. female with history of HTN, PVCs,  AFib.  She saw Dr. Caryl Comes in June 2020, she had numerous c/o Palpitations with SOB, exertional CP Planned for ZIO patich and calcium scoring (with h/o neg stress 4 years prior), echo, and sleep study He last saw her Aug 2020, she was doing well.  Mentioned her BP better controlled and her uterine bleeding improved as well. Discussed her CHA2DS2Vasc score of 2 (including gender).  If recurrent palpitations would consider a/c.   She was found with severe sleep apnea planned for titration and CPAP tx Monitor noted Duration:  12d 8h  Findings HR  avg 81  Min 60-Max 127  SVT Nonsustained  1 episodes; fastest 100 bpm for 17 beats;   4 triggered events, one assoc with PVC-isolated; the others assoc with sinus PVCs < 1% PACs < 1%  Echo noted preserved LVEF, no significant VHD Ca++ score was zero  I saw her 04/2019 She is doing well.  She only feels palpitations when she misses her medicines, has now set an alarm for this and with good medicine compliance she has not had any.  No dizzy spells, near syncope or syncope.  No SOB She only feels some chest tightness when at peak exercise when she is very SOB, with slowing this settles quickly as does her breathing. She was waiting on CPAP titration when her insurance had resumed , which it as, but had not yet set this up. She sees her PMD soon, planned for annual labs Her insurance was straightened out and planned to get back on track with OSA management She had no symptoms of Afib Not on a/c with score of 2 (including gender) Encouraged exercise, weight loss  Last note was a video visit with Dr. Radford Pax, discussed non-compliance with her CPAP,  requiring BIPAP though her insurance declined 2/2 noncompliance and she had to return the device. 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 No formal or regular exercise She has become more aware of palpitations and suspects she is having more AFib, where it used to be only when missed doses, now seems more often, though mostly at times of increased personal stress. Episodes are brief, irregular feeling heart beating No near syncope or syncope No rest SOB or CP, no difficulties with ADLS though when she is doing strenuous activities she gets a little heavy in her chest.slowing/resting resolves it quickly She is not using BIPAP/CPAP or any kind of apnea therapy Discussed/urged compliance with her CPAP therapy, planned for coronary angio Risk score remained 2 (including gender), not on a/c Planned for 4rmo  This was her last visit  Admitted 02/19/22 for acute gastritis, chronic cholecystitis, > lap chole Discharged 02/24/22  *** risk score *** burden, symptoms *** CPAP/BIPAP    AFib Hx Diagnosed April 2020  > had spontaneous conversion to SR No AAD hx to date   Past Medical History:  Diagnosis Date   Acute gout 07/30/2014   Anemia    Anxiety    Blood transfusion without reported diagnosis    Cigarette nicotine dependence  GERD (gastroesophageal reflux disease)    Gout 2011   Hypertension    Malignant hyperthermia    PAF (paroxysmal atrial fibrillation) (HCC)    PVC (premature ventricular contraction)    Sleep apnea    not on cpap at this time 01-17-20    Past Surgical History:  Procedure Laterality Date   BIOPSY  02/21/2022   Procedure: BIOPSY;  Surgeon: Carol Ada, MD;  Location: [facility];  Service: Gastroenterology;;   CHOLECYSTECTOMY N/A 02/23/2022   Procedure: LAPAROSCOPIC CHOLECYSTECTOMY with Lysis of  Adhessions;  Surgeon: Felicie Morn, MD;  Location: [facility];  Service: General;  Laterality: N/A;   ESOPHAGOGASTRODUODENOSCOPY (EGD) WITH PROPOFOL N/A 02/21/2022   Procedure: ESOPHAGOGASTRODUODENOSCOPY (EGD) WITH PROPOFOL;  Surgeon: Carol Ada, MD;  Location: [facility];  Service: Gastroenterology;  Laterality: N/A;   NECK SURGERY     TUBAL LIGATION      Current Outpatient Medications  Medication Sig Dispense Refill   acetaminophen (TYLENOL) 325 MG tablet Take 2 tablets (650 mg total) by mouth every 6 (six) hours. (Patient not taking: Reported on 03/29/2022)     cholecalciferol (VITAMIN D3) 25 MCG (1000 UT) tablet Take 1,000 Units by mouth daily.     cyanocobalamin (VITAMIN B12) 1000 MCG tablet Take 1 tablet (1,000 mcg total) by mouth daily.     diltiazem (CARDIZEM CD) 240 MG 24 hr capsule Take 1 capsule (240 mg total) by mouth daily. 90 capsule 3   fluconazole (DIFLUCAN) 150 MG tablet Take 1 tablet (150 mg total) by mouth daily. Take second tab 3days apart from first tab 2 tablet 0   losartan (COZAAR) 25 MG tablet Take 1 tablet (25 mg total) by mouth in the morning. 90 tablet 3   metoprolol succinate (TOPROL-XL) 25 MG 24 hr tablet Take 1 tablet (25 mg total) by mouth daily. 90 tablet 3   metroNIDAZOLE (FLAGYL) 500 MG tablet Take 1 tablet (500 mg total) by mouth 2 (two) times daily. (Patient not taking: Reported on 03/29/2022) 28 tablet 0   oxyCODONE (OXY IR/ROXICODONE) 5 MG immediate release tablet Take 1 tablet (5 mg total) by mouth every 6 (six) hours as needed for moderate pain. 15 tablet 0   pantoprazole (PROTONIX) 40 MG tablet Take 1 tablet (40 mg total) by mouth daily. 90 tablet 3   sertraline (ZOLOFT) 50 MG tablet Take 1 tablet (50 mg total) by mouth daily. 90 tablet 3   spironolactone (ALDACTONE) 25 MG tablet Take 1 tablet (25 mg total) by mouth daily. 90 tablet 1   Current Facility-Administered Medications  Medication Dose Route Frequency Provider Last Rate Last Admin   0.9 %   sodium chloride infusion  500 mL Intravenous Once Mansouraty, Telford Nab., MD        Allergies:   Lisinopril and Bupropion   Social History:  The patient  reports that she quit smoking about 5 months ago. Her smoking use included cigarettes. She has a 15.50 pack-year smoking history. She has never used smokeless tobacco. She reports that she does not drink alcohol and does not use drugs.   Family History:  The patient's family history includes Breast cancer in her paternal grandmother; Cancer (age of onset: 38) in her father; Diabetes in her brother, mother, and sister; Hypertension in her brother, mother, and sister.  ROS:  Please see the history of present illness.  All other systems are reviewed and otherwise negative.   PHYSICAL EXAM:  VS:  There were no vitals taken for this visit. BMI:  There is no height or weight on file to calculate BMI. Well nourished, well developed, in no acute distress  HEENT: normocephalic, atraumatic  Neck: no JVD, carotid bruits or masses Cardiac: *** RRR; no significant murmurs, no rubs, or gallops Lungs: *** CTA b/l, no wheezing, rhonchi or rales  Abd: soft, nontender, obese MS: no deformity or atrophy Ext: *** no edema  Skin: warm and dry, no rash Neuro:  No gross deficits appreciated Psych: euthymic mood, full affect   EKG:  done today and reviewed by myself: ***  02/26/21: Coronary angio IMPRESSION: 1. No evidence of CAD, CADRADS = 0. 2. Coronary calcium score of 0. This was 0 percentile for age and sex matched control. 3. Normal coronary origin with right dominance. 4.  Diffuse aortic atherosclerosis in descending aorta.    IMPRESSION: 1.  Aortic Atherosclerosis (ICD10-I70.0).  Dec 2020 heart monitor Duration:  12d 8h Findings HR  avg 81  Min 60-Max 127  SVT Nonsustained  1 episodes; fastest 100 bpm for 17 beats;   4 triggered events, one assoc with PVC-isolated; the others assoc with sinus PVCs < 1% PACs < 1%   09/19/2018:  Coronary Ca score IMPRESSION: Coronary calcium score of 0.   09/19/2018: TTE IMPRESSIONS   1. The left ventricle has hyperdynamic systolic function, with an  ejection fraction of >65%. The cavity size was normal. Left ventricular  diastolic Doppler parameters are consistent with impaired relaxation. No  evidence of left ventricular regional wall   motion abnormalities.   2. The right ventricle has normal systolic function. The cavity was  normal. There is no increase in right ventricular wall thickness.   3. The aortic root is normal in size and structure.   Recent Labs: 02/20/2022: Magnesium 2.2 03/24/2022: ALT 12; ALT 12; BUN 10; Creatinine, Ser 0.79; Hemoglobin 12.1; Platelets 404.0; Potassium 3.7; Sodium 140  No results found for requested labs within last 365 days.   CrCl cannot be calculated (Patient's most recent lab result is older than the maximum 21 days allowed.).   Wt Readings from Last 3 Encounters:  03/29/22 165 lb 12.8 oz (75.2 kg)  03/12/22 165 lb (74.8 kg)  02/21/22 162 lb (73.5 kg)     Other studies reviewed: Additional studies/records reviewed today include: summarized above  ASSESSMENT AND PLAN:  1. PVCs 2. Paroxysmal AFib     No changes in her PMHx, her CHA2DS2Vasc remains *** 2 for HTN, gender     *** Not on a/c      2. HTN     ***   3. OSA     *** As above  4. Chest pressure *** No CAD by CT angio Dec 2022             Disposition: ***    Current medicines are reviewed at length with the patient today.  The patient did not have any concerns regarding medicines.  Venetia Night, PA-C 04/19/2022 6:58 AM     [address] [place] [address] [phone] (office)  [phone] (fax)

## 2022-04-21 ENCOUNTER — Ambulatory Visit: Payer: BC Managed Care – PPO | Attending: Physician Assistant | Admitting: Physician Assistant

## 2022-05-06 NOTE — Progress Notes (Deleted)
Cardiology Office Note Date:  05/06/2022  Patient ID:  Emma Stephens 1970/06/28, MRN [id number] PCP:  Flossie Buffy, NP  Cardiologist:  None Electrophysiologist: Virl Axe, MD  ***refresh   Chief Complaint: 1 year follow-up, past due; Afib, PVCs  History of Present Illness: Emma Stephens is a 52 y.o. female with PMH notable for HTN, PVCs, parox Afib, OSA (noncompliant with CPAP); seen today for Virl Axe, MD for routine electrophysiology followup. Since last being seen in our clinic the patient reports doing ***. She last saw PA Charlcie Cradle, 01/2021 was having more palpitations. Used to only feel palps when missed medication, now when taking meds. Has heaviness in chest with strenuous activity, resolves quickly w rest. Not using BIPAP/CPAP. Strongly recommended to restart sleep apnea treatment; metop increased, planned for coronary angio to eval chest pressure.  *** sleep apnea treatment? *** palps / AF burden *** CP     she denies chest pain, palpitations, dyspnea, PND, orthopnea, nausea, vomiting, dizziness, syncope, edema, weight gain, or early satiety.    AAD History: none  Past Medical History:  Diagnosis Date   Acute gout 07/30/2014   Anemia    Anxiety    Blood transfusion without reported diagnosis    Cigarette nicotine dependence    GERD (gastroesophageal reflux disease)    Gout 2011   Hypertension    Malignant hyperthermia    PAF (paroxysmal atrial fibrillation) ([place])    PVC (premature ventricular contraction)    Sleep apnea    not on cpap at this time 01-17-20    Past Surgical History:  Procedure Laterality Date   BIOPSY  02/21/2022   Procedure: BIOPSY;  Surgeon: Carol Ada, MD;  Location: [facility];  Service: Gastroenterology;;   CHOLECYSTECTOMY N/A 02/23/2022   Procedure: LAPAROSCOPIC CHOLECYSTECTOMY with Lysis of Adhessions;  Surgeon: Felicie Morn, MD;  Location: [facility];  Service: General;  Laterality: N/A;    ESOPHAGOGASTRODUODENOSCOPY (EGD) WITH PROPOFOL N/A 02/21/2022   Procedure: ESOPHAGOGASTRODUODENOSCOPY (EGD) WITH PROPOFOL;  Surgeon: Carol Ada, MD;  Location: [facility];  Service: Gastroenterology;  Laterality: N/A;   NECK SURGERY     TUBAL LIGATION      Current Outpatient Medications  Medication Instructions   acetaminophen (TYLENOL) 650 mg, Oral, Every 6 hours   cholecalciferol (VITAMIN D3) 1,000 Units, Oral, Daily   cyanocobalamin (VITAMIN B12) 1,000 mcg, Oral, Daily   diltiazem (CARDIZEM CD) 240 mg, Oral, Daily   fluconazole (DIFLUCAN) 150 mg, Oral, Daily, Take second tab 3days apart from first tab   losartan (COZAAR) 25 mg, Oral, Every morning   metoprolol succinate (TOPROL-XL) 25 mg, Oral, Daily   metroNIDAZOLE (FLAGYL) 500 mg, Oral, 2 times daily   oxyCODONE (OXY IR/ROXICODONE) 5 mg, Oral, Every 6 hours PRN   pantoprazole (PROTONIX) 40 mg, Oral, Daily   sertraline (ZOLOFT) 50 mg, Oral, Daily   spironolactone (ALDACTONE) 25 mg, Oral, Daily      Social History:  The patient  reports that she quit smoking about 6 months ago. Her smoking use included cigarettes. She has a 15.50 pack-year smoking history. She has never used smokeless tobacco. She reports that she does not drink alcohol and does not use drugs.   Family History:  *** include only if pertinent The patient's family history includes Breast cancer in her paternal grandmother; Cancer (age of onset: 52) in her father; Diabetes in her brother, mother, and sister; Hypertension in her brother, mother, and sister.***  ROS:  Please see the history of present illness.  All other systems are reviewed and otherwise negative.   PHYSICAL EXAM: *** VS:  There were no vitals taken for this visit. BMI: There is no height or weight on file to calculate BMI.  GEN- The patient is well appearing, alert and oriented x 3 today.   HEENT: normocephalic, atraumatic; sclera clear, conjunctiva pink; hearing intact; oropharynx clear; neck  supple, no JVP Lungs- Clear to ausculation bilaterally, normal work of breathing.  No wheezes, rales, rhonchi Heart- {Blank single:19197::"Regular","Irregularly irregular"} rate and rhythm, no murmurs, rubs or gallops, PMI not laterally displaced GI- soft, non-tender, non-distended, bowel sounds present, no hepatosplenomegaly Extremities- {EDEMA LEVEL:28147::"No"} peripheral edema. no clubbing or cyanosis; DP/PT/radial pulses 2+ bilaterally MS- no significant deformity or atrophy Skin- warm and dry, no rash or lesion Psych- euthymic mood, full affect Neuro- strength and sensation are intact   EKG {ACTION; IS/IS GI:[id number] ordered. Personal review of EKG from {Blank single:19197::"today","***"} shows:  ***  Recent Labs: 02/20/2022: Magnesium 2.2 03/24/2022: ALT 12; ALT 12; BUN 10; Creatinine, Ser 0.79; Hemoglobin 12.1; Platelets 404.0; Potassium 3.7; Sodium 140  No results found for requested labs within last 365 days.   CrCl cannot be calculated (Patient's most recent lab result is older than the maximum 21 days allowed.).   Wt Readings from Last 3 Encounters:  03/29/22 165 lb 12.8 oz (75.2 kg)  03/12/22 165 lb (74.8 kg)  02/21/22 162 lb (73.5 kg)     Additional studies reviewed include: Previous EP, cardiology notes.   CT coronary, 02/26/2021 1. No evidence of CAD, CADRADS = 0.  2. Coronary calcium score of 0. This was 0 percentile for age and sex matched control.  3. Normal coronary origin with right dominance. 4.  Diffuse aortic atherosclerosis in descending aorta.  TTE, 09/19/2018  1. The left ventricle has hyperdynamic systolic function, with an ejection fraction of >65%. The cavity size was normal. Left ventricular diastolic Doppler parameters are consistent with impaired relaxation. No evidence of left ventricular regional wall  motion abnormalities.   2. The right ventricle has normal systolic function. The cavity was normal. There is no increase in right ventricular wall  thickness.   3. The aortic root is normal in size and structure.   ASSESSMENT AND PLAN:  #) parox AF   CHA2DS2-VASc Score = 2 [CHF History: 0, HTN History: 1, Diabetes History: 0, Stroke History: 0, Vascular Disease History: 0, Age Score: 0, Gender Score: 1].  Therefore, the patient's annual risk of stroke is 2.2 %. [place] - not on [place]     {Confirm score is correct.  If not, click here to update score.  REFRESH note.  :1}   #) OSA    Current medicines are reviewed at length with the patient today.   The patient {ACTIONS; HAS/DOES NOT HAVE:19233} concerns regarding her medicines.  The following changes were made today:  {NONE DEFAULTED:18576}  Labs/ tests ordered today include: *** No orders of the defined types were placed in this encounter.    Disposition: Follow up with {EPMDS:28135} in {EPFOLLOW UP:28173}   Signed, Mamie Levers, NP  05/06/22  7:46 PM  Electrophysiology [facility]

## 2022-05-07 ENCOUNTER — Ambulatory Visit (INDEPENDENT_AMBULATORY_CARE_PROVIDER_SITE_OTHER): Payer: BC Managed Care – PPO | Admitting: Cardiology

## 2022-05-07 DIAGNOSIS — I480 Paroxysmal atrial fibrillation: Secondary | ICD-10-CM

## 2022-05-07 DIAGNOSIS — I10 Essential (primary) hypertension: Secondary | ICD-10-CM

## 2022-05-07 DIAGNOSIS — I493 Ventricular premature depolarization: Secondary | ICD-10-CM

## 2022-05-07 DIAGNOSIS — G4733 Obstructive sleep apnea (adult) (pediatric): Secondary | ICD-10-CM

## 2022-05-14 ENCOUNTER — Other Ambulatory Visit: Payer: BC Managed Care – PPO

## 2022-05-14 DIAGNOSIS — B9681 Helicobacter pylori [H. pylori] as the cause of diseases classified elsewhere: Secondary | ICD-10-CM | POA: Diagnosis not present

## 2022-05-14 DIAGNOSIS — K2970 Gastritis, unspecified, without bleeding: Secondary | ICD-10-CM | POA: Diagnosis not present

## 2022-05-14 NOTE — Progress Notes (Signed)
Pt is here for stool kit

## 2022-05-16 LAB — H. PYLORI ANTIGEN, STOOL: H pylori Ag, Stl: NEGATIVE

## 2022-05-17 NOTE — Progress Notes (Signed)
Error, pt cancelled appt

## 2022-05-17 NOTE — Progress Notes (Signed)
Stable Follow instructions as discussed during office visit.

## 2022-06-04 ENCOUNTER — Ambulatory Visit: Payer: BC Managed Care – PPO | Attending: Physician Assistant | Admitting: Physician Assistant

## 2022-06-04 NOTE — Progress Notes (Deleted)
Cardiology Office Note Date:  06/04/2022  Patient ID:  Emma Stephens, Emma Stephens September 04, 1970, MRN [id number] PCP:  Flossie Buffy, NP  Cardiologist:  Dr. Caryl Comes    Chief Complaint:  *** over due  History of Present Illness: Emma Stephens is a 52 y.o. female with history of HTN, PVCs,  AFib.  She saw Dr. Caryl Comes in June 2020, she had numerous c/o Palpitations with SOB, exertional CP Planned for ZIO patich and calcium scoring (with h/o neg stress 4 years prior), echo, and sleep study He last saw her Aug 2020, she was doing well.  Mentioned her BP better controlled and her uterine bleeding improved as well. Discussed her CHA2DS2Vasc score of 2 (including gender).  If recurrent palpitations would consider a/c.   She was found with severe sleep apnea planned for titration and CPAP tx Monitor noted Duration:  12d 8h  Findings HR  avg 81  Min 60-Max 127  SVT Nonsustained  1 episodes; fastest 100 bpm for 17 beats;   4 triggered events, one assoc with PVC-isolated; the others assoc with sinus PVCs < 1% PACs < 1%  Echo noted preserved LVEF, no significant VHD Ca++ score was zero  I saw her 04/2019 She is doing well.  She only feels palpitations when she misses her medicines, has now set an alarm for this and with good medicine compliance she has not had any.  No dizzy spells, near syncope or syncope.  No SOB She only feels some chest tightness when at peak exercise when she is very SOB, with slowing this settles quickly as does her breathing. She was waiting on CPAP titration when her insurance had resumed , which it as, but had not yet set this up. She sees her PMD soon, planned for annual labs Her insurance was straightened out and planned to get back on track with OSA management She had no symptoms of Afib Not on a/c with score of 2 (including gender) Encouraged exercise, weight loss  Last note was a video visit with Dr. Radford Pax, discussed non-compliance with her CPAP, requiring  BIPAP though her insurance declined 2/2 noncompliance and she had to return the device. 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 No formal or regular exercise She has become more aware of palpitations and suspects she is having more AFib, where it used to be only when missed doses, now seems more often, though mostly at times of increased personal stress. Episodes are brief, irregular feeling heart beating No near syncope or syncope No rest SOB or CP, no difficulties with ADLS though when she is doing strenuous activities she gets a little heavy in her chest.slowing/resting resolves it quickly She is not using BIPAP/CPAP or any kind of apnea therapy Discussed that I felt this was the primary driver for her palpitations, AF and urged compliance.  If ongoing increased burden of palpitations despite apnea management would pursue monitoring Planned coronary angio  CT with ca zero, no CAD  *** symptoms, burden *** CPAP? BIPAP?? *** risk score   AFib Hx Diagnosed April 2020  > had spontaneous conversion to SR No AAD hx to date   Past Medical History:  Diagnosis Date   Acute gout 07/30/2014   Anemia    Anxiety    Blood transfusion without reported diagnosis    Cigarette nicotine dependence    GERD (gastroesophageal reflux  disease)    Gout 2011   Hypertension    Malignant hyperthermia    PAF (paroxysmal atrial fibrillation) (HCC)    PVC (premature ventricular contraction)    Sleep apnea    not on cpap at this time 01-17-20    Past Surgical History:  Procedure Laterality Date   BIOPSY  02/21/2022   Procedure: BIOPSY;  Surgeon: Carol Ada, MD;  Location: [facility];  Service: Gastroenterology;;   CHOLECYSTECTOMY N/A 02/23/2022   Procedure: LAPAROSCOPIC CHOLECYSTECTOMY with Lysis of Adhessions;  Surgeon: Felicie Morn, MD;   Location: [facility];  Service: General;  Laterality: N/A;   ESOPHAGOGASTRODUODENOSCOPY (EGD) WITH PROPOFOL N/A 02/21/2022   Procedure: ESOPHAGOGASTRODUODENOSCOPY (EGD) WITH PROPOFOL;  Surgeon: Carol Ada, MD;  Location: [facility];  Service: Gastroenterology;  Laterality: N/A;   NECK SURGERY     TUBAL LIGATION      Current Outpatient Medications  Medication Sig Dispense Refill   acetaminophen (TYLENOL) 325 MG tablet Take 2 tablets (650 mg total) by mouth every 6 (six) hours. (Patient not taking: Reported on 03/29/2022)     cholecalciferol (VITAMIN D3) 25 MCG (1000 UT) tablet Take 1,000 Units by mouth daily.     cyanocobalamin (VITAMIN B12) 1000 MCG tablet Take 1 tablet (1,000 mcg total) by mouth daily.     diltiazem (CARDIZEM CD) 240 MG 24 hr capsule Take 1 capsule (240 mg total) by mouth daily. 90 capsule 3   fluconazole (DIFLUCAN) 150 MG tablet Take 1 tablet (150 mg total) by mouth daily. Take second tab 3days apart from first tab 2 tablet 0   losartan (COZAAR) 25 MG tablet Take 1 tablet (25 mg total) by mouth in the morning. 90 tablet 3   metoprolol succinate (TOPROL-XL) 25 MG 24 hr tablet Take 1 tablet (25 mg total) by mouth daily. 90 tablet 3   metroNIDAZOLE (FLAGYL) 500 MG tablet Take 1 tablet (500 mg total) by mouth 2 (two) times daily. (Patient not taking: Reported on 03/29/2022) 28 tablet 0   oxyCODONE (OXY IR/ROXICODONE) 5 MG immediate release tablet Take 1 tablet (5 mg total) by mouth every 6 (six) hours as needed for moderate pain. 15 tablet 0   pantoprazole (PROTONIX) 40 MG tablet Take 1 tablet (40 mg total) by mouth daily. 90 tablet 3   sertraline (ZOLOFT) 50 MG tablet Take 1 tablet (50 mg total) by mouth daily. 90 tablet 3   spironolactone (ALDACTONE) 25 MG tablet Take 1 tablet (25 mg total) by mouth daily. 90 tablet 1   Current Facility-Administered Medications  Medication Dose Route Frequency Provider Last Rate Last Admin   0.9 %  sodium chloride infusion  500 mL Intravenous  Once Mansouraty, Telford Nab., MD        Allergies:   Lisinopril and Bupropion   Social History:  The patient  reports that she quit smoking about 7 months ago. Her smoking use included cigarettes. She has a 15.50 pack-year smoking history. She has never used smokeless tobacco. She reports that she does not drink alcohol and does not use drugs.   Family History:  The patient's family history includes Breast cancer in her paternal grandmother; Cancer (age of onset: 69) in her father; Diabetes in her brother, mother, and sister; Hypertension in her brother, mother, and sister.  ROS:  Please see the history of present illness.  All other systems are reviewed and otherwise negative.   PHYSICAL EXAM:  VS:  There were no vitals taken for this visit. BMI: There is no  height or weight on file to calculate BMI. Well nourished, well developed, in no acute distress  HEENT: normocephalic, atraumatic  Neck: no JVD, carotid bruits or masses Cardiac: *** RRR; no significant murmurs, no rubs, or gallops Lungs: *** CTA b/l, no wheezing, rhonchi or rales  Abd: soft, nontender, obese MS: no deformity or atrophy Ext: *** no edema  Skin: warm and dry, no rash Neuro:  No gross deficits appreciated Psych: euthymic mood, full affect   EKG:  done today and reviewed by myself: ***  02/26/21: coronary angio IMPRESSION: 1. No evidence of CAD, CADRADS = 0. 2. Coronary calcium score of 0. This was 0 percentile for age and sex matched control. 3. Normal coronary origin with right dominance. 4.  Diffuse aortic atherosclerosis in descending aorta.  Dec 2020 heart monitor Duration:  12d 8h Findings HR  avg 81  Min 60-Max 127  SVT Nonsustained  1 episodes; fastest 100 bpm for 17 beats;   4 triggered events, one assoc with PVC-isolated; the others assoc with sinus PVCs < 1% PACs < 1%   09/19/2018: Coronary Ca score IMPRESSION: Coronary calcium score of 0.   09/19/2018: TTE IMPRESSIONS   1. The left  ventricle has hyperdynamic systolic function, with an  ejection fraction of >65%. The cavity size was normal. Left ventricular  diastolic Doppler parameters are consistent with impaired relaxation. No  evidence of left ventricular regional wall   motion abnormalities.   2. The right ventricle has normal systolic function. The cavity was  normal. There is no increase in right ventricular wall thickness.   3. The aortic root is normal in size and structure.   Recent Labs: 02/20/2022: Magnesium 2.2 03/24/2022: ALT 12; ALT 12; BUN 10; Creatinine, Ser 0.79; Hemoglobin 12.1; Platelets 404.0; Potassium 3.7; Sodium 140  No results found for requested labs within last 365 days.   CrCl cannot be calculated (Patient's most recent lab result is older than the maximum 21 days allowed.).   Wt Readings from Last 3 Encounters:  03/29/22 165 lb 12.8 oz (75.2 kg)  03/12/22 165 lb (74.8 kg)  02/21/22 162 lb (73.5 kg)     Other studies reviewed: Additional studies/records reviewed today include: summarized above  ASSESSMENT AND PLAN:  1. PVCs 2. Paroxysmal AFib     No changes in her PMHx, her CHA2DS2Vasc remains *** 2 for HTN, gender     *** Not on a/c     ***   2. HTN     ***   3. OSA     ***  4. Chest pressure Ca++ score zero a couple years ago ***             Disposition: back in 4 mo or so, hopefully she will be using BIPAP and doing better   Current medicines are reviewed at length with the patient today.  The patient did not have any concerns regarding medicines.  Venetia Night, PA-C 06/04/2022 6:53 AM     [place]  [address] [phone] (office)  [phone] (fax)

## 2022-07-02 ENCOUNTER — Ambulatory Visit (INDEPENDENT_AMBULATORY_CARE_PROVIDER_SITE_OTHER): Payer: BC Managed Care – PPO | Admitting: Nurse Practitioner

## 2022-07-02 ENCOUNTER — Encounter: Payer: Self-pay | Admitting: Nurse Practitioner

## 2022-07-02 VITALS — BP 120/84 | HR 80 | Temp 97.9°F | Resp 16 | Ht 61.0 in | Wt 166.8 lb

## 2022-07-02 DIAGNOSIS — D509 Iron deficiency anemia, unspecified: Secondary | ICD-10-CM | POA: Diagnosis not present

## 2022-07-02 DIAGNOSIS — I480 Paroxysmal atrial fibrillation: Secondary | ICD-10-CM | POA: Diagnosis not present

## 2022-07-02 DIAGNOSIS — I10 Essential (primary) hypertension: Secondary | ICD-10-CM

## 2022-07-02 DIAGNOSIS — E7800 Pure hypercholesterolemia, unspecified: Secondary | ICD-10-CM | POA: Diagnosis not present

## 2022-07-02 DIAGNOSIS — Z0001 Encounter for general adult medical examination with abnormal findings: Secondary | ICD-10-CM | POA: Diagnosis not present

## 2022-07-02 NOTE — Assessment & Plan Note (Signed)
Repeat H. Pylori stool: negative

## 2022-07-02 NOTE — Progress Notes (Signed)
Complete physical exam  Patient: Emma Stephens   DOB: [birth year]   52 y.o. Female  MRN: [id number] Visit Date: 07/02/2022  Subjective:    Chief Complaint  Patient presents with   Annual Exam    Fasting- No (Chicken biscuit and sweet tea)    Onell Smolinski is a 52 y.o. female who presents today for a complete physical exam. She reports consuming a general diet.  No exercise regimen  She generally feels well. She reports sleeping well. She does not have additional problems to discuss today.  Vision:No Dental:No STD Screen:No  BP Readings from Last 3 Encounters:  07/02/22 120/84  03/29/22 118/76  03/12/22 122/62   Wt Readings from Last 3 Encounters:  07/02/22 166 lb 12.8 oz (75.7 kg)  03/29/22 165 lb 12.8 oz (75.2 kg)  03/12/22 165 lb (74.8 kg)   Most recent fall risk assessment:    07/02/2022   10:29 AM  Fall Risk   Falls in the past year? 0  Number falls in past yr: 0  Injury with Fall? 0  Risk for fall due to : No Fall Risks  Follow up Falls evaluation completed   Depression screen:Yes - No Depression  Most recent depression screenings:    07/02/2022   10:30 AM 12/25/2021   11:26 AM  PHQ 2/9 Scores  PHQ - 2 Score 1 1  PHQ- 9 Score 10 4    HPI  Helicobacter pylori gastritis Repeat H. Pylori stool: negative  Paroxysmal atrial fibrillation (HCC) Regular and normal rate No palpitation of CP or SOB or dizziness or syncope.  Iron deficiency anemia Repeat cbc and IBC No GI or GU or vaginal bleed  Essential hypertension, benign BP at goal with metoprolol, losartan and spironolactone BP Readings from Last 3 Encounters:  07/02/22 120/84  03/29/22 118/76  03/12/22 122/62    Maintain med doses   Past Medical History:  Diagnosis Date   Acute gout 07/30/2014   Anemia    Anxiety    Blood transfusion without reported diagnosis    Cigarette nicotine dependence    GERD (gastroesophageal reflux disease)    Gout 2011   Helicobacter pylori gastritis  02/19/2022   Hypertension    Malignant hyperthermia    PAF (paroxysmal atrial fibrillation)    PVC (premature ventricular contraction)    Sleep apnea    not on cpap at this time 01-17-20   Past Surgical History:  Procedure Laterality Date   BIOPSY  02/21/2022   Procedure: BIOPSY;  Surgeon: Jeani Hawking, MD;  Location: [facility];  Service: Gastroenterology;;   CHOLECYSTECTOMY N/A 02/23/2022   Procedure: LAPAROSCOPIC CHOLECYSTECTOMY with Lysis of Adhessions;  Surgeon: Quentin Ore, MD;  Location: [facility];  Service: General;  Laterality: N/A;   ESOPHAGOGASTRODUODENOSCOPY (EGD) WITH PROPOFOL N/A 02/21/2022   Procedure: ESOPHAGOGASTRODUODENOSCOPY (EGD) WITH PROPOFOL;  Surgeon: Jeani Hawking, MD;  Location: [facility];  Service: Gastroenterology;  Laterality: N/A;   NECK SURGERY     TUBAL LIGATION     Social History   Socioeconomic History   Marital status: Married    Spouse name: Not on file   Number of children: Not on file   Years of education: Not on file   Highest education level: Not on file  Occupational History   Occupation: Cooking    Employer: [employer]  Tobacco Use   Smoking status: Former    Packs/day: 0.50    Years: 31.00    Additional pack years: 0.00    Total pack  years: 15.50    Types: Cigarettes    Quit date: 11/02/2021    Years since quitting: 0.6   Smokeless tobacco: Never  Vaping Use   Vaping Use: Some days  Substance and Sexual Activity   Alcohol use: No   Drug use: No   Sexual activity: Yes    Partners: Male    Birth control/protection: Post-menopausal  Other Topics Concern   Not on file  Social History Narrative   Lives in [place] with family.   Social Determinants of Health   Financial Resource Strain: Not on file  Food Insecurity: Not on file  Transportation Needs: Not on file  Physical Activity: Not on file  Stress: Not on file  Social Connections: Not on file  Intimate Partner Violence: Not on file   Family Status   Relation Name Status   Mother Minus Liberty Alive   Father Mervin Kung Deceased   Sister Link Snuffer Alive   Brother Molly Maduro Caesar Alive   PGM  (Not Specified)   Neg Hx  (Not Specified)   Family History  Problem Relation Age of Onset   Diabetes Mother    Hypertension Mother    Cancer Father 52       oral   Diabetes Sister    Hypertension Sister    Diabetes Brother    Hypertension Brother    Breast cancer Paternal Grandmother    Sudden Cardiac Death Neg Hx    Heart attack Neg Hx    Colon cancer Neg Hx    Esophageal cancer Neg Hx    Rectal cancer Neg Hx    Stomach cancer Neg Hx    Allergies  Allergen Reactions   Lisinopril Hives, Swelling and Other (See Comments)    Angioedema and facial swelling   Bupropion Itching and Other (See Comments)    Patient Care Team: Deondrea Aguado, Bonna Gains, NP as PCP - General (Internal Medicine) Quintella Reichert, MD as PCP - Sleep Medicine (Cardiology) Duke Salvia, MD as PCP - Electrophysiology (Cardiology) Tracey Harries, MD as Consulting Physician (Obstetrics and Gynecology)   Medications: Outpatient Medications Prior to Visit  Medication Sig   cyanocobalamin (VITAMIN B12) 1000 MCG tablet Take 1 tablet (1,000 mcg total) by mouth daily.   diltiazem (CARDIZEM CD) 240 MG 24 hr capsule Take 1 capsule (240 mg total) by mouth daily.   famotidine (PEPCID) 20 MG tablet Take 20 mg by mouth as needed for heartburn or indigestion.   losartan (COZAAR) 25 MG tablet Take 1 tablet (25 mg total) by mouth in the morning.   metoprolol succinate (TOPROL-XL) 25 MG 24 hr tablet Take 1 tablet (25 mg total) by mouth daily.   sertraline (ZOLOFT) 50 MG tablet Take 1 tablet (50 mg total) by mouth daily.   spironolactone (ALDACTONE) 25 MG tablet Take 1 tablet (25 mg total) by mouth daily.   cholecalciferol (VITAMIN D3) 25 MCG (1000 UT) tablet Take 1,000 Units by mouth daily. (Patient not taking: Reported on 07/02/2022)   [DISCONTINUED]  acetaminophen (TYLENOL) 325 MG tablet Take 2 tablets (650 mg total) by mouth every 6 (six) hours. (Patient not taking: Reported on 03/29/2022)   [DISCONTINUED] fluconazole (DIFLUCAN) 150 MG tablet Take 1 tablet (150 mg total) by mouth daily. Take second tab 3days apart from first tab (Patient not taking: Reported on 07/02/2022)   [DISCONTINUED] metroNIDAZOLE (FLAGYL) 500 MG tablet Take 1 tablet (500 mg total) by mouth 2 (two) times daily. (Patient not taking: Reported on 03/29/2022)   [  DISCONTINUED] oxyCODONE (OXY IR/ROXICODONE) 5 MG immediate release tablet Take 1 tablet (5 mg total) by mouth every 6 (six) hours as needed for moderate pain. (Patient not taking: Reported on 07/02/2022)   [DISCONTINUED] pantoprazole (PROTONIX) 40 MG tablet Take 1 tablet (40 mg total) by mouth daily. (Patient not taking: Reported on 07/02/2022)   Facility-Administered Medications Prior to Visit  Medication Dose Route Frequency Provider   0.9 %  sodium chloride infusion  500 mL Intravenous Once Mansouraty, Netty Starring., MD    Review of Systems  Constitutional:  Negative for activity change, appetite change and unexpected weight change.  Respiratory: Negative.    Cardiovascular: Negative.   Gastrointestinal: Negative.   Endocrine: Negative for cold intolerance and heat intolerance.  Genitourinary: Negative.   Musculoskeletal: Negative.   Skin: Negative.   Neurological: Negative.   Hematological: Negative.   Psychiatric/Behavioral:  Negative for behavioral problems, decreased concentration, dysphoric mood, hallucinations, self-injury, sleep disturbance and suicidal ideas. The patient is not nervous/anxious.         Objective:  BP 120/84 (BP Location: Left Arm, Patient Position: Sitting, Cuff Size: Large)   Pulse 80   Temp 97.9 F (36.6 C) (Temporal)   Resp 16   Ht 5\' 1"  (1.549 m)   Wt 166 lb 12.8 oz (75.7 kg)   LMP 12/25/2021 (Exact Date)   SpO2 97%   BMI 31.52 kg/m     Physical Exam Vitals and nursing  note reviewed.  Constitutional:      General: She is not in acute distress. HENT:     Right Ear: Tympanic membrane, ear canal and external ear normal.     Left Ear: Tympanic membrane, ear canal and external ear normal.     Nose: Nose normal.  Eyes:     Extraocular Movements: Extraocular movements intact.     Conjunctiva/sclera: Conjunctivae normal.     Pupils: Pupils are equal, round, and reactive to light.  Neck:     Thyroid: No thyroid mass, thyromegaly or thyroid tenderness.  Cardiovascular:     Rate and Rhythm: Normal rate and regular rhythm.     Pulses: Normal pulses.     Heart sounds: Normal heart sounds.  Pulmonary:     Effort: Pulmonary effort is normal.     Breath sounds: Normal breath sounds.  Abdominal:     General: Bowel sounds are normal.     Palpations: Abdomen is soft.  Genitourinary:    Comments: Deferred to GYN Musculoskeletal:        General: Normal range of motion.     Cervical back: Normal range of motion and neck supple.     Right lower leg: No edema.     Left lower leg: No edema.  Lymphadenopathy:     Cervical: No cervical adenopathy.  Skin:    General: Skin is warm and dry.  Neurological:     Mental Status: She is alert and oriented to person, place, and time.     Cranial Nerves: No cranial nerve deficit.  Psychiatric:        Mood and Affect: Mood normal.        Behavior: Behavior normal.        Thought Content: Thought content normal.     Results for orders placed or performed in visit on 07/02/22  HM PAP SMEAR  Result Value Ref Range   HM Pap smear Normal       Assessment & Plan:    Routine Health Maintenance and Physical Exam  Immunization History  Administered Date(s) Administered   PFIZER Comirnaty(Gray Top)Covid-19 Tri-Sucrose Vaccine 05/14/2020   PFIZER(Purple Top)SARS-COV-2 Vaccination 10/15/2019, 10/16/2019, 11/13/2019   Td 03/22/2008   Health Maintenance  Topic Date Due   Zoster Vaccines- Shingrix (1 of 2) 02/07/2023  (Originally 04/02/2019)   Hepatitis C Screening  07/02/2023 (Originally [birth year]/02/1987)   INFLUENZA VACCINE  10/21/2022   COLONOSCOPY (Pts 45-28yrs Insurance coverage will need to be confirmed)  01/31/2023   MAMMOGRAM  04/01/2023   PAP SMEAR-Modifier  04/16/2025   HIV Screening  Completed   HPV VACCINES  Aged Out   DTaP/Tdap/Td  Discontinued   COVID-19 Vaccine  Discontinued   Discussed health benefits of physical activity, and encouraged her to engage in regular exercise appropriate for her age and condition.  Problem List Items Addressed This Visit       Cardiovascular and Mediastinum   Essential hypertension, benign    BP at goal with metoprolol, losartan and spironolactone BP Readings from Last 3 Encounters:  07/02/22 120/84  03/29/22 118/76  03/12/22 122/62    Maintain med doses      Paroxysmal atrial fibrillation    Regular and normal rate No palpitation of CP or SOB or dizziness or syncope.        Other   Iron deficiency anemia    Repeat cbc and IBC No GI or GU or vaginal bleed      Relevant Orders   CBC with Differential/Platelet   IBC + Ferritin   Other Visit Diagnoses     Encounter for preventative adult health care exam with abnormal findings    -  Primary   Relevant Orders   Comprehensive metabolic panel   Elevated LDL cholesterol level       Relevant Orders   Lipid panel      Return in about 1 year (around 07/02/2023) for CPE (fasting).     Alysia Penna, NP

## 2022-07-02 NOTE — Assessment & Plan Note (Signed)
BP at goal with metoprolol, losartan and spironolactone BP Readings from Last 3 Encounters:  07/02/22 120/84  03/29/22 118/76  03/12/22 122/62    Maintain med doses

## 2022-07-02 NOTE — Assessment & Plan Note (Signed)
Regular and normal rate No palpitation of CP or SOB or dizziness or syncope.

## 2022-07-02 NOTE — Patient Instructions (Signed)
Schedule fasting lab appt. Need to be fasting 8hrs prior to blood draw. Ok to drink water and take BP meds. Continue Heart healthy diet and daily exercise. Maintain current medications. Schedule appt for mammogram, eye exam and dental cleaning  Preventive Care 107-53 Years Old, Female Preventive care refers to lifestyle choices and visits with your health care provider that can promote health and wellness. Preventive care visits are also called wellness exams. What can I expect for my preventive care visit? Counseling Your health care provider may ask you questions about your: Medical history, including: Past medical problems. Family medical history. Pregnancy history. Current health, including: Menstrual cycle. Method of birth control. Emotional well-being. Home life and relationship well-being. Sexual activity and sexual health. Lifestyle, including: Alcohol, nicotine or tobacco, and drug use. Access to firearms. Diet, exercise, and sleep habits. Work and work Astronomer. Sunscreen use. Safety issues such as seatbelt and bike helmet use. Physical exam Your health care provider will check your: Height and weight. These may be used to calculate your BMI (body mass index). BMI is a measurement that tells if you are at a healthy weight. Waist circumference. This measures the distance around your waistline. This measurement also tells if you are at a healthy weight and may help predict your risk of certain diseases, such as type 2 diabetes and high blood pressure. Heart rate and blood pressure. Body temperature. Skin for abnormal spots. What immunizations do I need?  Vaccines are usually given at various ages, according to a schedule. Your health care provider will recommend vaccines for you based on your age, medical history, and lifestyle or other factors, such as travel or where you work. What tests do I need? Screening Your health care provider may recommend screening tests  for certain conditions. This may include: Lipid and cholesterol levels. Diabetes screening. This is done by checking your blood sugar (glucose) after you have not eaten for a while (fasting). Pelvic exam and Pap test. Hepatitis B test. Hepatitis C test. HIV (human immunodeficiency virus) test. STI (sexually transmitted infection) testing, if you are at risk. Lung cancer screening. Colorectal cancer screening. Mammogram. Talk with your health care provider about when you should start having regular mammograms. This may depend on whether you have a family history of breast cancer. BRCA-related cancer screening. This may be done if you have a family history of breast, ovarian, tubal, or peritoneal cancers. Bone density scan. This is done to screen for osteoporosis. Talk with your health care provider about your test results, treatment options, and if necessary, the need for more tests. Follow these instructions at home: Eating and drinking  Eat a diet that includes fresh fruits and vegetables, whole grains, lean protein, and low-fat dairy products. Take vitamin and mineral supplements as recommended by your health care provider. Do not drink alcohol if: Your health care provider tells you not to drink. You are pregnant, may be pregnant, or are planning to become pregnant. If you drink alcohol: Limit how much you have to 0-1 drink a day. Know how much alcohol is in your drink. In the [place] one drink equals one 12 oz bottle of beer (355 mL), one 5 oz glass of wine (148 mL), or one 1 oz glass of hard liquor (44 mL). Lifestyle Brush your teeth every morning and night with fluoride toothpaste. Floss one time each day. Exercise for at least 30 minutes 5 or more days each week. Do not use any products that contain nicotine or tobacco. These products  include cigarettes, chewing tobacco, and vaping devices, such as e-cigarettes. If you need help quitting, ask your health care provider. Do not use  drugs. If you are sexually active, practice safe sex. Use a condom or other form of protection to prevent STIs. If you do not wish to become pregnant, use a form of birth control. If you plan to become pregnant, see your health care provider for a prepregnancy visit. Take aspirin only as told by your health care provider. Make sure that you understand how much to take and what form to take. Work with your health care provider to find out whether it is safe and beneficial for you to take aspirin daily. Find healthy ways to manage stress, such as: Meditation, yoga, or listening to music. Journaling. Talking to a trusted person. Spending time with friends and family. Minimize exposure to UV radiation to reduce your risk of skin cancer. Safety Always wear your seat belt while driving or riding in a vehicle. Do not drive: If you have been drinking alcohol. Do not ride with someone who has been drinking. When you are tired or distracted. While texting. If you have been using any mind-altering substances or drugs. Wear a helmet and other protective equipment during sports activities. If you have firearms in your house, make sure you follow all gun safety procedures. Seek help if you have been physically or sexually abused. What's next? Visit your health care provider once a year for an annual wellness visit. Ask your health care provider how often you should have your eyes and teeth checked. Stay up to date on all vaccines. This information is not intended to replace advice given to you by your health care provider. Make sure you discuss any questions you have with your health care provider. Document Revised: 09/03/2020 Document Reviewed: 09/03/2020 Elsevier Patient Education  2023 [employer].

## 2022-07-02 NOTE — Assessment & Plan Note (Signed)
Repeat cbc and IBC No GI or GU or vaginal bleed

## 2022-07-09 ENCOUNTER — Other Ambulatory Visit (INDEPENDENT_AMBULATORY_CARE_PROVIDER_SITE_OTHER): Payer: BC Managed Care – PPO

## 2022-07-09 DIAGNOSIS — E7800 Pure hypercholesterolemia, unspecified: Secondary | ICD-10-CM

## 2022-07-09 DIAGNOSIS — Z0001 Encounter for general adult medical examination with abnormal findings: Secondary | ICD-10-CM

## 2022-07-09 DIAGNOSIS — D509 Iron deficiency anemia, unspecified: Secondary | ICD-10-CM | POA: Diagnosis not present

## 2022-07-09 LAB — COMPREHENSIVE METABOLIC PANEL
ALT: 10 U/L (ref 0–35)
AST: 15 U/L (ref 0–37)
Albumin: 4.5 g/dL (ref 3.5–5.2)
Alkaline Phosphatase: 92 U/L (ref 39–117)
BUN: 12 mg/dL (ref 6–23)
CO2: 27 mEq/L (ref 19–32)
Calcium: 9.4 mg/dL (ref 8.4–10.5)
Chloride: 103 mEq/L (ref 96–112)
Creatinine, Ser: 0.67 mg/dL (ref 0.40–1.20)
GFR: 99.89 mL/min (ref >60.00)
Glucose, Bld: 83 mg/dL (ref 70–99)
Potassium: 4.5 mEq/L (ref 3.5–5.1)
Sodium: 139 mEq/L (ref 135–145)
Total Bilirubin: 0.4 mg/dL (ref 0.2–1.2)
Total Protein: 7.3 g/dL (ref 6.0–8.3)

## 2022-07-09 LAB — CBC WITH DIFFERENTIAL/PLATELET
Basophils Absolute: 0.1 10*3/uL (ref 0.0–0.1)
Basophils Relative: 1.2 % (ref 0.0–3.0)
Eosinophils Absolute: 0.2 10*3/uL (ref 0.0–0.7)
Eosinophils Relative: 2.5 % (ref 0.0–5.0)
HCT: 37.7 % (ref 36.0–46.0)
Hemoglobin: 12.5 g/dL (ref 12.0–15.0)
Lymphocytes Relative: 48 % — ABNORMAL HIGH (ref 12.0–46.0)
Lymphs Abs: 2.9 10*3/uL (ref 0.7–4.0)
MCHC: 33 g/dL (ref 30.0–36.0)
MCV: 84.5 fl (ref 78.0–100.0)
Monocytes Absolute: 0.4 10*3/uL (ref 0.1–1.0)
Monocytes Relative: 7 % (ref 3.0–12.0)
Neutro Abs: 2.5 10*3/uL (ref 1.4–7.7)
Neutrophils Relative %: 41.3 % — ABNORMAL LOW (ref 43.0–77.0)
Platelets: 405 10*3/uL — ABNORMAL HIGH (ref 150.0–400.0)
RBC: 4.46 Mil/uL (ref 3.87–5.11)
RDW: 14.6 % (ref 11.5–15.5)
WBC: 6 10*3/uL (ref 4.0–10.5)

## 2022-07-09 LAB — IBC + FERRITIN
Ferritin: 11.9 ng/mL (ref 10.0–291.0)
Iron: 63 ug/dL (ref 42–145)
Saturation Ratios: 15.6 % — ABNORMAL LOW (ref 20.0–50.0)
TIBC: 404.6 ug/dL (ref 250.0–450.0)
Transferrin: 289 mg/dL (ref 212.0–360.0)

## 2022-07-09 LAB — LIPID PANEL
Cholesterol: 151 mg/dL (ref 0–200)
HDL: 46.9 mg/dL (ref >39.00)
LDL Cholesterol: 91 mg/dL (ref 0–99)
NonHDL: 103.66
Total CHOL/HDL Ratio: 3
Triglycerides: 62 mg/dL (ref 0.0–149.0)
VLDL: 12.4 mg/dL (ref 0.0–40.0)

## 2022-07-09 NOTE — Progress Notes (Signed)
Stable Follow instructions as discussed during office visit.

## 2022-07-25 ENCOUNTER — Other Ambulatory Visit: Payer: Self-pay | Admitting: Nurse Practitioner

## 2022-07-25 DIAGNOSIS — I10 Essential (primary) hypertension: Secondary | ICD-10-CM

## 2022-08-05 ENCOUNTER — Other Ambulatory Visit: Payer: Self-pay | Admitting: Nurse Practitioner

## 2022-08-05 DIAGNOSIS — I10 Essential (primary) hypertension: Secondary | ICD-10-CM

## 2022-08-30 ENCOUNTER — Telehealth: Payer: Self-pay | Admitting: Nurse Practitioner

## 2022-08-30 NOTE — Telephone Encounter (Signed)
Left message to call the office and schedule an office visit.

## 2022-08-30 NOTE — Telephone Encounter (Signed)
Pt said can you give her a call . Pt stated she is having the same abdominal pain again

## 2022-08-31 ENCOUNTER — Ambulatory Visit: Payer: BC Managed Care – PPO | Admitting: Nurse Practitioner

## 2022-08-31 ENCOUNTER — Ambulatory Visit (INDEPENDENT_AMBULATORY_CARE_PROVIDER_SITE_OTHER)
Admission: RE | Admit: 2022-08-31 | Discharge: 2022-08-31 | Disposition: A | Discharge status: Home / Self Care | Payer: BC Managed Care – PPO | Source: Ambulatory Visit | Attending: Nurse Practitioner | Admitting: Nurse Practitioner

## 2022-08-31 VITALS — BP 112/70 | HR 79 | Temp 97.9°F | Resp 16 | Ht 61.0 in | Wt 165.2 lb

## 2022-08-31 DIAGNOSIS — R109 Unspecified abdominal pain: Secondary | ICD-10-CM

## 2022-08-31 DIAGNOSIS — K5900 Constipation, unspecified: Secondary | ICD-10-CM

## 2022-08-31 LAB — COMPREHENSIVE METABOLIC PANEL
ALT: 22 U/L (ref 0–35)
AST: 21 U/L (ref 0–37)
Albumin: 4.6 g/dL (ref 3.5–5.2)
Alkaline Phosphatase: 75 U/L (ref 39–117)
BUN: 11 mg/dL (ref 6–23)
CO2: 27 mEq/L (ref 19–32)
Calcium: 9.9 mg/dL (ref 8.4–10.5)
Chloride: 100 mEq/L (ref 96–112)
Creatinine, Ser: 0.7 mg/dL (ref 0.40–1.20)
GFR: 98.74 mL/min (ref >60.00)
Glucose, Bld: 81 mg/dL (ref 70–99)
Potassium: 4.1 mEq/L (ref 3.5–5.1)
Sodium: 138 mEq/L (ref 135–145)
Total Bilirubin: 0.4 mg/dL (ref 0.2–1.2)
Total Protein: 7.7 g/dL (ref 6.0–8.3)

## 2022-08-31 LAB — POCT URINALYSIS DIPSTICK
Bilirubin, UA: NEGATIVE
Blood, UA: NEGATIVE
Glucose, UA: NEGATIVE
Ketones, UA: NEGATIVE
Leukocytes, UA: NEGATIVE
Nitrite, UA: NEGATIVE
Protein, UA: NEGATIVE
Spec Grav, UA: >=1.03 — AB (ref 1.010–1.025)
Urobilinogen, UA: 0.2 E.U./dL
pH, UA: 6 (ref 5.0–8.0)

## 2022-08-31 LAB — CBC WITH DIFFERENTIAL/PLATELET
Basophils Absolute: 0 10*3/uL (ref 0.0–0.1)
Basophils Relative: 0.5 % (ref 0.0–3.0)
Eosinophils Absolute: 0.2 10*3/uL (ref 0.0–0.7)
Eosinophils Relative: 2.5 % (ref 0.0–5.0)
HCT: 38.9 % (ref 36.0–46.0)
Hemoglobin: 12.6 g/dL (ref 12.0–15.0)
Lymphocytes Relative: 46.3 % — ABNORMAL HIGH (ref 12.0–46.0)
Lymphs Abs: 3.1 10*3/uL (ref 0.7–4.0)
MCHC: 32.3 g/dL (ref 30.0–36.0)
MCV: 84.6 fl (ref 78.0–100.0)
Monocytes Absolute: 0.6 10*3/uL (ref 0.1–1.0)
Monocytes Relative: 9.2 % (ref 3.0–12.0)
Neutro Abs: 2.8 10*3/uL (ref 1.4–7.7)
Neutrophils Relative %: 41.5 % — ABNORMAL LOW (ref 43.0–77.0)
Platelets: 384 10*3/uL (ref 150.0–400.0)
RBC: 4.6 Mil/uL (ref 3.87–5.11)
RDW: 14.1 % (ref 11.5–15.5)
WBC: 6.8 10*3/uL (ref 4.0–10.5)

## 2022-08-31 LAB — LIPASE: Lipase: 7 U/L — ABNORMAL LOW (ref 11.0–59.0)

## 2022-08-31 LAB — C-REACTIVE PROTEIN: CRP: 5.6 mg/dL (ref 0.5–20.0)

## 2022-08-31 MED ORDER — LACTULOSE 10 GM/15ML PO SOLN
20.0000 g | Freq: Every day | ORAL | 0 refills | Status: DC | PRN
Start: 2022-08-31 — End: 2022-09-02

## 2022-08-31 NOTE — Patient Instructions (Addendum)
Go to lab  Go to [address] for x-ray. Maintain clear liquid diet Go to ED if symptoms worsen  Clear Liquid Diet, Adult A clear liquid diet is a diet that includes only liquids and semi-liquids that you can see through. No solid food is eaten on this diet. Most people need to follow this diet for only a short time. You may need to follow a clear liquid diet if: You have a problem right before or after you have surgery. You did not eat food for a long time. You had any of these: Vomiting or feeling like you may vomit (nausea). Watery poop (diarrhea). You are going to have an exam to look at parts of your digestive system. You are going to have bowel surgery. What are the goals of this diet? To rest the stomach. To help you clear the digestive system before an exam. To make sure that there is enough fluid in your body. To make sure you get some energy. To help you get back to eating like you used to. What are tips for following this plan? A clear liquid is a liquid or semi-liquid that you can see through when you hold it up to a light. An example of a semi-liquid is gelatin. This diet does not give you all the nutrients that you need. Choose a variety of the liquids that your doctor says you can have on this diet. That way, you will get as many nutrients as possible. If you are not sure whether you can have certain items, ask your doctor. If you cannot swallow a thin liquid, you will need to thicken it before taking it. This will stop you from breathing it in (aspirating). What foods should I eat?  Water and flavored water. Fruit juices that do not have pulp. This includes cranberry juice, apple juice, or grape juice. Tea and coffee without milk or cream. Clear bouillon or broth. Broth-based soups that have been strained. Flavored gelatin. Honey. Sugar water. Ice or frozen ice pops that do not have any milk, yogurt, fruit pieces, or fruit pulp in them. Clear sodas. Clear  sports drinks. The items listed above may not be a complete list of what you can eat and drink. Contact a dietitian for more options. What foods should I avoid? Juices that have pulp. Milk. Cream or cream-based soups. Yogurt. Solid foods that are not clear liquids or semi-liquids. The items listed above may not be a complete list of what you should not eat and drink. Contact a dietitian for more information. Questions to ask your doctor: How long do I need to follow this diet? Are there any medicines that I should change while on this diet? Summary A clear liquid diet is a diet that includes only liquids and semi-liquids that you can see through. Some goals of this diet are to rest your stomach and make sure you get enough fluid. Avoid liquids with milk, cream, or pulp while you are on this diet. This information is not intended to replace advice given to you by your health care provider. Make sure you discuss any questions you have with your health care provider. Document Revised: 12/25/2019 Document Reviewed: 12/25/2019 Elsevier Patient Education  2024 [employer].

## 2022-08-31 NOTE — Progress Notes (Signed)
Established Patient Visit  Patient: Emma Stephens   DOB: 07-12-1970   52 y.o. Female  MRN: [id number] Visit Date: 08/31/2022  Subjective:    Chief Complaint  Patient presents with   Abdominal Pain    Lower abdominal- tenderness with occasional stabbing, cramping pain.  Started Friday.  She has been taking Miralax with some relief.    Abdominal Pain This is a new problem. The current episode started in the past 7 days. The onset quality is gradual. The problem occurs constantly. The problem has been unchanged. The pain is located in the generalized abdominal region. The quality of the pain is a sensation of fullness and colicky. The abdominal pain radiates to the pelvis. Associated symptoms include belching and constipation. Pertinent negatives include no anorexia, arthralgias, diarrhea, dysuria, fever, flatus, frequency, headaches, hematochezia, hematuria, melena, myalgias, nausea or vomiting. The pain is aggravated by eating. The pain is relieved by Passing flatus and recumbency. Treatments tried: miralax. There is no history of gallstones.  Onset on Friday, unable to remember last normal BM, had small BM on Friday with use of miralax. S/p cholecystectomy 02/2022 Last colonoscopy 2021: large polyps removed, pathology- (colon, sigmoid, polyp (3)TUBULAR ADENOMA, NEGATIVE FOR HIGH GRADE DYSPLASIA (X1). HYPERPLASTIC POLYP (X3). EMR polyp at 21cm-HYPERPLASTIC POLYP.) Upper endoscopy 02/2022: gastritis due to H, pylori, normal espphagus, negative metaplasia or malignancy. H. Pylori infection treated. Repeat test 04/2022 was negative.  Wt Readings from Last 3 Encounters:  08/31/22 165 lb 3.2 oz (74.9 kg)  07/02/22 166 lb 12.8 oz (75.7 kg)  03/29/22 165 lb 12.8 oz (75.2 kg)    Reviewed medical, surgical, and social history today  Medications: Outpatient Medications Prior to Visit  Medication Sig   cyanocobalamin (VITAMIN B12) 1000 MCG tablet Take 1 tablet (1,000 mcg total)  by mouth daily.   diltiazem (CARDIZEM CD) 240 MG 24 hr capsule Take 1 capsule (240 mg total) by mouth daily.   famotidine (PEPCID) 20 MG tablet Take 20 mg by mouth as needed for heartburn or indigestion.   losartan (COZAAR) 25 MG tablet Take 1 tablet (25 mg total) by mouth in the morning.   metoprolol succinate (TOPROL-XL) 25 MG 24 hr tablet TAKE 1 TABLET (25 MG TOTAL) BY MOUTH DAILY.   sertraline (ZOLOFT) 50 MG tablet Take 1 tablet (50 mg total) by mouth daily.   spironolactone (ALDACTONE) 25 MG tablet TAKE 1 TABLET (25 MG TOTAL) BY MOUTH DAILY.   Facility-Administered Medications Prior to Visit  Medication Dose Route Frequency Provider   0.9 %  sodium chloride infusion  500 mL Intravenous Once Mansouraty, Netty Starring., MD   Reviewed past medical and social history.   ROS per HPI above      Objective:  BP 112/70 (BP Location: Left Arm, Patient Position: Sitting, Cuff Size: Large)   Pulse 79   Temp 97.9 F (36.6 C) (Temporal)   Resp 16   Ht 5\' 1"  (1.549 m)   Wt 165 lb 3.2 oz (74.9 kg)   LMP 12/25/2021 (Exact Date)   SpO2 98%   BMI 31.21 kg/m      Physical Exam Vitals reviewed.  Constitutional:      Appearance: She is ill-appearing.  Abdominal:     General: Bowel sounds are normal. There is distension.     Palpations: Abdomen is soft.     Tenderness: There is generalized abdominal tenderness. There is guarding.  Neurological:  Mental Status: She is alert.     Results for orders placed or performed in visit on 08/31/22  CBC with Differential/Platelet  Result Value Ref Range   WBC 6.8 4.0 - 10.5 K/uL   RBC 4.60 3.87 - 5.11 Mil/uL   Hemoglobin 12.6 12.0 - 15.0 g/dL   HCT 16.1 09.6 - 04.5 %   MCV 84.6 78.0 - 100.0 fl   MCHC 32.3 30.0 - 36.0 g/dL   RDW 40.9 81.1 - 91.4 %   Platelets 384.0 150.0 - 400.0 K/uL   Neutrophils Relative % 41.5 (L) 43.0 - 77.0 %   Lymphocytes Relative 46.3 (H) 12.0 - 46.0 %   Monocytes Relative 9.2 3.0 - 12.0 %   Eosinophils Relative  2.5 0.0 - 5.0 %   Basophils Relative 0.5 0.0 - 3.0 %   Neutro Abs 2.8 1.4 - 7.7 K/uL   Lymphs Abs 3.1 0.7 - 4.0 K/uL   Monocytes Absolute 0.6 0.1 - 1.0 K/uL   Eosinophils Absolute 0.2 0.0 - 0.7 K/uL   Basophils Absolute 0.0 0.0 - 0.1 K/uL  Comprehensive metabolic panel  Result Value Ref Range   Sodium 138 135 - 145 mEq/L   Potassium 4.1 3.5 - 5.1 mEq/L   Chloride 100 96 - 112 mEq/L   CO2 27 19 - 32 mEq/L   Glucose, Bld 81 70 - 99 mg/dL   BUN 11 6 - 23 mg/dL   Creatinine, Ser 7.82 0.40 - 1.20 mg/dL   Total Bilirubin 0.4 0.2 - 1.2 mg/dL   Alkaline Phosphatase 75 39 - 117 U/L   AST 21 0 - 37 U/L   ALT 22 0 - 35 U/L   Total Protein 7.7 6.0 - 8.3 g/dL   Albumin 4.6 3.5 - 5.2 g/dL   GFR 95.62 >13.08 mL/min   Calcium 9.9 8.4 - 10.5 mg/dL  Lipase  Result Value Ref Range   Lipase 7.0 (L) 11.0 - 59.0 U/L  C-reactive protein  Result Value Ref Range   CRP 5.6 0.5 - 20.0 mg/dL  POCT urinalysis dipstick  Result Value Ref Range   Color, UA Yellow    Clarity, UA Clear    Glucose, UA Negative Negative   Bilirubin, UA Negative    Ketones, UA Negative    Spec Grav, UA >=1.030 (A) 1.010 - 1.025   Blood, UA Negative    pH, UA 6.0 5.0 - 8.0   Protein, UA Negative Negative   Urobilinogen, UA 0.2 0.2 or 1.0 E.U./dL   Nitrite, UA negative    Leukocytes, UA Negative Negative   Appearance     Odor        Assessment & Plan:    Problem List Items Addressed This Visit   None Visit Diagnoses     Acute abdominal pain    -  Primary   Relevant Orders   CBC with Differential/Platelet (Completed)   Comprehensive metabolic panel (Completed)   Lipase (Completed)   C-reactive protein (Completed)   POCT urinalysis dipstick (Completed)   DG Abd 1 View (Completed)   Constipation, unspecified constipation type       Relevant Medications   lactulose (CHRONULAC) 10 GM/15ML solution   Other Relevant Orders   DG Abd 1 View (Completed)      Severe constipation vs Bowel obstruction vs acute  pancreatitis? Advised to Maintain clear liquid diet Go to ED if symptoms worsen or does not improve in 24hrs  Return if symptoms worsen or fail to improve.  Wilfred Lacy, NP

## 2022-08-31 NOTE — Telephone Encounter (Signed)
Pt called back and has an appointment 6/11

## 2022-09-02 ENCOUNTER — Telehealth: Payer: Self-pay | Admitting: Nurse Practitioner

## 2022-09-02 DIAGNOSIS — R1030 Lower abdominal pain, unspecified: Secondary | ICD-10-CM

## 2022-09-02 NOTE — Telephone Encounter (Signed)
Emma Stephens reports improved ABDOMEN bloating, but persistent pain. Resolved constipation with lactulose, now developed diarrhea. No fever or nausea or vomiting or blood in stool. Advised to stop lactulose, go for CT abd/pelvis to r/o diverticulitis, maintain brat diet, adequate oral hydration and start probiotic 1cap daily. She verbalized understanding

## 2022-09-08 ENCOUNTER — Emergency Department (HOSPITAL_BASED_OUTPATIENT_CLINIC_OR_DEPARTMENT_OTHER): Payer: BC Managed Care – PPO

## 2022-09-08 ENCOUNTER — Emergency Department (HOSPITAL_COMMUNITY): Payer: BC Managed Care – PPO

## 2022-09-08 ENCOUNTER — Emergency Department (HOSPITAL_BASED_OUTPATIENT_CLINIC_OR_DEPARTMENT_OTHER)
Admission: EM | Admit: 2022-09-08 | Discharge: 2022-09-08 | Disposition: A | Discharge status: Home / Self Care | Payer: BC Managed Care – PPO | Attending: Emergency Medicine | Admitting: Emergency Medicine

## 2022-09-08 ENCOUNTER — Encounter (HOSPITAL_BASED_OUTPATIENT_CLINIC_OR_DEPARTMENT_OTHER): Payer: Self-pay | Admitting: Emergency Medicine

## 2022-09-08 DIAGNOSIS — R29898 Other symptoms and signs involving the musculoskeletal system: Secondary | ICD-10-CM | POA: Diagnosis not present

## 2022-09-08 DIAGNOSIS — I729 Aneurysm of unspecified site: Secondary | ICD-10-CM

## 2022-09-08 DIAGNOSIS — R531 Weakness: Secondary | ICD-10-CM | POA: Diagnosis not present

## 2022-09-08 DIAGNOSIS — I4891 Unspecified atrial fibrillation: Secondary | ICD-10-CM

## 2022-09-08 DIAGNOSIS — G459 Transient cerebral ischemic attack, unspecified: Secondary | ICD-10-CM | POA: Diagnosis not present

## 2022-09-08 DIAGNOSIS — R29818 Other symptoms and signs involving the nervous system: Secondary | ICD-10-CM | POA: Diagnosis not present

## 2022-09-08 DIAGNOSIS — R0602 Shortness of breath: Secondary | ICD-10-CM | POA: Diagnosis not present

## 2022-09-08 DIAGNOSIS — I6523 Occlusion and stenosis of bilateral carotid arteries: Secondary | ICD-10-CM | POA: Diagnosis not present

## 2022-09-08 LAB — CBC WITH DIFFERENTIAL/PLATELET
Abs Immature Granulocytes: 0.01 10*3/uL (ref 0.00–0.07)
Basophils Absolute: 0.1 10*3/uL (ref 0.0–0.1)
Basophils Relative: 1 %
Eosinophils Absolute: 0.2 10*3/uL (ref 0.0–0.5)
Eosinophils Relative: 3 %
HCT: 38.5 % (ref 36.0–46.0)
Hemoglobin: 12.9 g/dL (ref 12.0–15.0)
Immature Granulocytes: 0 %
Lymphocytes Relative: 49 %
Lymphs Abs: 4.2 10*3/uL — ABNORMAL HIGH (ref 0.7–4.0)
MCH: 27.7 pg (ref 26.0–34.0)
MCHC: 33.5 g/dL (ref 30.0–36.0)
MCV: 82.6 fL (ref 80.0–100.0)
Monocytes Absolute: 0.5 10*3/uL (ref 0.1–1.0)
Monocytes Relative: 5 %
Neutro Abs: 3.6 10*3/uL (ref 1.7–7.7)
Neutrophils Relative %: 42 %
Platelets: 434 10*3/uL — ABNORMAL HIGH (ref 150–400)
RBC: 4.66 MIL/uL (ref 3.87–5.11)
RDW: 13.9 % (ref 11.5–15.5)
WBC: 8.5 10*3/uL (ref 4.0–10.5)
nRBC: 0 % (ref 0.0–0.2)

## 2022-09-08 LAB — COMPREHENSIVE METABOLIC PANEL
ALT: 17 U/L (ref 0–44)
AST: 23 U/L (ref 15–41)
Albumin: 4.2 g/dL (ref 3.5–5.0)
Alkaline Phosphatase: 82 U/L (ref 38–126)
Anion gap: 9 (ref 5–15)
BUN: 13 mg/dL (ref 6–20)
CO2: 21 mmol/L — ABNORMAL LOW (ref 22–32)
Calcium: 9.1 mg/dL (ref 8.9–10.3)
Chloride: 104 mmol/L (ref 98–111)
Creatinine, Ser: 0.75 mg/dL (ref 0.44–1.00)
GFR, Estimated: >60 mL/min (ref >60)
Glucose, Bld: 138 mg/dL — ABNORMAL HIGH (ref 70–99)
Potassium: 4 mmol/L (ref 3.5–5.1)
Sodium: 134 mmol/L — ABNORMAL LOW (ref 135–145)
Total Bilirubin: 0.5 mg/dL (ref 0.3–1.2)
Total Protein: 7.7 g/dL (ref 6.5–8.1)

## 2022-09-08 LAB — RAPID URINE DRUG SCREEN, HOSP PERFORMED
Amphetamines: NOT DETECTED
Barbiturates: NOT DETECTED
Benzodiazepines: NOT DETECTED
Cocaine: NOT DETECTED
Opiates: NOT DETECTED
Tetrahydrocannabinol: NOT DETECTED

## 2022-09-08 LAB — TROPONIN I (HIGH SENSITIVITY)
Troponin I (High Sensitivity): 5 ng/L (ref <18)
Troponin I (High Sensitivity): 7 ng/L (ref <18)

## 2022-09-08 LAB — PROTIME-INR
INR: 1 (ref 0.8–1.2)
Prothrombin Time: 13.4 seconds (ref 11.4–15.2)

## 2022-09-08 LAB — LIPID PANEL
Cholesterol: 165 mg/dL (ref 0–200)
HDL: 49 mg/dL (ref >40)
LDL Cholesterol: 105 mg/dL — ABNORMAL HIGH (ref 0–99)
Total CHOL/HDL Ratio: 3.4 RATIO
Triglycerides: 53 mg/dL (ref <150)
VLDL: 11 mg/dL (ref 0–40)

## 2022-09-08 LAB — ETHANOL: Alcohol, Ethyl (B): <10 mg/dL (ref <10)

## 2022-09-08 LAB — HEMOGLOBIN A1C
Hgb A1c MFr Bld: 6 % — ABNORMAL HIGH (ref 4.8–5.6)
Mean Plasma Glucose: 125.5 mg/dL

## 2022-09-08 LAB — CBG MONITORING, ED: Glucose-Capillary: 120 mg/dL — ABNORMAL HIGH (ref 70–99)

## 2022-09-08 LAB — APTT: aPTT: 27 seconds (ref 24–36)

## 2022-09-08 MED ORDER — IOHEXOL 350 MG/ML SOLN
75.0000 mL | Freq: Once | INTRAVENOUS | Status: AC | PRN
  Administered 2022-09-08: 75 mL via INTRAVENOUS

## 2022-09-08 MED ORDER — APIXABAN 5 MG PO TABS: 5.0000 mg | ORAL_TABLET | Freq: Two times a day (BID) | ORAL | 0 refills | Status: DC

## 2022-09-08 MED ORDER — APIXABAN 5 MG PO TABS
5.0000 mg | ORAL_TABLET | Freq: Two times a day (BID) | ORAL | Status: DC
  Administered 2022-09-08: 5 mg via ORAL
  Filled 2022-09-08: qty 1

## 2022-09-08 MED ORDER — METOPROLOL TARTRATE 5 MG/5ML IV SOLN
5.0000 mg | Freq: Once | INTRAVENOUS | Status: AC
  Administered 2022-09-08: 5 mg via INTRAVENOUS
  Filled 2022-09-08: qty 5

## 2022-09-08 NOTE — Consult Note (Addendum)
NEUROLOGY CONSULTATION NOTE   Date of service: September 08, 2022 Patient Name: Emma Stephens MRN:  [id number] DOB:  1970/08/23 Reason for consult: "L sided weakness" Requesting Provider: Melene Plan, DO _ _ _   _ __   _ __ _ _  __ __   _ __   __ _  History of Present Illness  Emma Stephens is a 52 y.o. female with PMH significant for pafibb not on AC, HTN, GERD, sleep apnea, who presents with episode of afibb at home. Felt chest pressure and achiness typical of her afibb. This started around 10AM. She works 3rd shift so had gotten home at 730AM and was doing fine. She took her betablocker and went to sleep hoping that would get her out of Afibb. She woke up sometime later and still felt she was in afibb. She called her husband. She drover over to [facility] ED. Upon arriving at [facility], developed sudden onset LUE heaviness and LLE weakness and dragging her left leg. Staff helped her. She reports improvement in symptoms and spontaneous resolution in abour 2-3 hours.  CT Head at [facility] ED was negative. She was transferred to [facility] for further evaluation and workup. She had MRI Brain w/o contrast which was negative for any acute stroke.  She reports she follows with cardiology. Was started on eliquis for a month but was stopped by cardiology as it was felt unneeded at that time. LKW: 10AM mRS: 0 tNKASE: not offered due to complete resolution of symptoms. Thrombectomy: not offered 2/2 no symptoms. NIHSS components Score: Comment  1a Level of Conscious 0[x]  1[]  2[]  3[]      1b LOC Questions 0[x]  1[]  2[]       1c LOC Commands 0[x]  1[]  2[]       2 Best Gaze 0[x]  1[]  2[]       3 Visual 0[x]  1[]  2[]  3[]      4 Facial Palsy 0[x]  1[]  2[]  3[]      5a Motor Arm - left 0[x]  1[]  2[]  3[]  4[]  UN[]    5b Motor Arm - Right 0[x]  1[]  2[]  3[]  4[]  UN[]    6a Motor Leg - Left 0[x]  1[]  2[]  3[]  4[]  UN[]    6b Motor Leg - Right 0[x]  1[]  2[]  3[]  4[]  UN[]    7 Limb Ataxia 0[x]  1[]  2[]  3[]  UN[]     8 Sensory 0[x]  1[]  2[]  UN[]      9  Best Language 0[x]  1[]  2[]  3[]      10 Dysarthria 0[x]  1[]  2[]  UN[]      11 Extinct. and Inattention 0[x]  1[]  2[]       TOTAL: 0      ROS   Constitutional Denies weight loss, fever and chills.   HEENT Denies changes in vision and hearing.   Respiratory Denies SOB and cough.   CV Denies palpitations and CP   GI Denies abdominal pain, nausea, vomiting and diarrhea.   GU Denies dysuria and urinary frequency.   MSK Denies myalgia and joint pain.   Skin Denies rash and pruritus.   Neurological Denies headache and syncope.   Psychiatric Denies recent changes in mood. Denies anxiety and depression.    Past History   Past Medical History:  Diagnosis Date   Acute gout 07/30/2014   Anemia    Anxiety    Blood transfusion without reported diagnosis    Cigarette nicotine dependence    GERD (gastroesophageal reflux disease)    Gout 2011   Helicobacter pylori gastritis 02/19/2022   Hypertension    Malignant hyperthermia  PAF (paroxysmal atrial fibrillation) (HCC)    PVC (premature ventricular contraction)    Sleep apnea    not on cpap at this time 01-17-20   Past Surgical History:  Procedure Laterality Date   BIOPSY  02/21/2022   Procedure: BIOPSY;  Surgeon: Jeani Hawking, MD;  Location: [facility];  Service: Gastroenterology;;   CHOLECYSTECTOMY N/A 02/23/2022   Procedure: LAPAROSCOPIC CHOLECYSTECTOMY with Lysis of Adhessions;  Surgeon: Quentin Ore, MD;  Location: [facility];  Service: General;  Laterality: N/A;   ESOPHAGOGASTRODUODENOSCOPY (EGD) WITH PROPOFOL N/A 02/21/2022   Procedure: ESOPHAGOGASTRODUODENOSCOPY (EGD) WITH PROPOFOL;  Surgeon: Jeani Hawking, MD;  Location: [facility];  Service: Gastroenterology;  Laterality: N/A;   NECK SURGERY     TUBAL LIGATION     Family History  Problem Relation Age of Onset   Diabetes Mother    Hypertension Mother    Cancer Father 62       oral   Diabetes Sister    Hypertension Sister    Diabetes Brother    Hypertension  Brother    Breast cancer Paternal Grandmother    Sudden Cardiac Death Neg Hx    Heart attack Neg Hx    Colon cancer Neg Hx    Esophageal cancer Neg Hx    Rectal cancer Neg Hx    Stomach cancer Neg Hx    Social History   Socioeconomic History   Marital status: Married    Spouse name: Not on file   Number of children: Not on file   Years of education: Not on file   Highest education level: Some college, no degree  Occupational History   Occupation: Magazine features editor: [employer]  Tobacco Use   Smoking status: Former    Packs/day: 0.50    Years: 31.00    Additional pack years: 0.00    Total pack years: 15.50    Types: Cigarettes    Quit date: 11/02/2021    Years since quitting: 0.8   Smokeless tobacco: Never  Vaping Use   Vaping Use: Some days  Substance and Sexual Activity   Alcohol use: No   Drug use: No   Sexual activity: Yes    Partners: Male    Birth control/protection: Post-menopausal  Other Topics Concern   Not on file  Social History Narrative   Lives in [place] with family.   Social Determinants of Health   Financial Resource Strain: Low Risk  (08/31/2022)   Overall Financial Resource Strain (CARDIA)    Difficulty of Paying Living Expenses: Not hard at all  Food Insecurity: No Food Insecurity (08/31/2022)   Hunger Vital Sign    Worried About Running Out of Food in the Last Year: Never true    Ran Out of Food in the Last Year: Never true  Transportation Needs: No Transportation Needs (08/31/2022)   PRAPARE - Administrator, Civil Service (Medical): No    Lack of Transportation (Non-Medical): No  Physical Activity: Unknown (08/31/2022)   Exercise Vital Sign    Days of Exercise per Week: 0 days    Minutes of Exercise per Session: Not on file  Stress: Patient Declined (08/31/2022)   [employer] of Occupational Health - Occupational Stress Questionnaire    Feeling of Stress : Patient declined  Social Connections: Unknown (08/31/2022)   Social  Connection and Isolation Panel [NHANES]    Frequency of Communication with Friends and Family: More than three times a week    Frequency of Social Gatherings  with Friends and Family: Once a week    Attends Religious Services: More than 4 times per year    Active Member of Clubs or Organizations: Not on file    Attends Banker Meetings: Not on file    Marital Status: Married   Allergies  Allergen Reactions   Lisinopril Hives, Swelling and Other (See Comments)    Angioedema and facial swelling   Bupropion Itching and Other (See Comments)    Medications  (Not in a hospital admission)    Vitals   Vitals:   09/08/22 1345 09/08/22 1615 09/08/22 1718 09/08/22 2043  BP: 115/87 113/74 120/81 118/71  Pulse: 95 74 69 72  Resp: (!) 23 18 16 17   Temp:   98 F (36.7 C) 98 F (36.7 C)  TempSrc:   Oral   SpO2: 97% 100% 100% 100%  Weight:      Height:         Body mass index is 31.2 kg/m.  Physical Exam   General: Laying comfortably in bed; in no acute distress.  HENT: Normal oropharynx and mucosa. Normal external appearance of ears and nose.  Neck: Supple, no pain or tenderness  CV: No JVD. No peripheral edema.  Pulmonary: Symmetric Chest rise. Normal respiratory effort.  Abdomen: Soft to touch, non-tender.  Ext: No cyanosis, edema, or deformity  Skin: No rash. Normal palpation of skin.   Musculoskeletal: Normal digits and nails by inspection. No clubbing.   Neurologic Examination  Mental status/Cognition: Alert, oriented to self, place, month and year, good attention.  Speech/language: Fluent, comprehension intact, object naming intact, repetition intact.  Cranial nerves:   CN II Pupils equal and reactive to light, no VF deficits    CN III,IV,VI EOM intact, no gaze preference or deviation, no nystagmus    CN V normal sensation in V1, V2, and V3 segments bilaterally    CN VII no asymmetry, no nasolabial fold flattening    CN VIII normal hearing to speech    CN  IX & X normal palatal elevation, no uvular deviation    CN XI 5/5 head turn and 5/5 shoulder shrug bilaterally    CN XII midline tongue protrusion    Motor:  Muscle bulk: normal, tone normal, pronator drift none tremor none Mvmt Root Nerve  Muscle Right Left Comments  SA C5/6 Ax Deltoid 5 5   EF C5/6 Mc Biceps 5 5   EE C6/7/8 Rad Triceps 5 5   WF C6/7 Med FCR     WE C7/8 PIN ECU     F Ab C8/T1 U ADM/FDI 5 5   HF L1/2/3 Fem Illopsoas 5 5   KE L2/3/4 Fem Quad 5 5   DF L4/5 D Peron Tib Ant 5 5   PF S1/2 Tibial Grc/Sol 5 5    Reflexes:  Right Left Comments  Pectoralis      Biceps (C5/6) 2 2   Brachioradialis (C5/6) 2 2    Triceps (C6/7) 2 2    Patellar (L3/4) 2 2    Achilles (S1)      Hoffman      Plantar     Jaw jerk    Sensation:  Light touch Intact throughout   Pin prick    Temperature    Vibration   Proprioception    Coordination/Complex Motor:  - Finger to Nose intact BL - Heel to shin intact BL - Rapid alternating movement are normal - Gait: Deferred for patient safety.  Labs  CBC:  Recent Labs  Lab 09/08/22 1235  WBC 8.5  NEUTROABS 3.6  HGB 12.9  HCT 38.5  MCV 82.6  PLT 434*    Basic Metabolic Panel:  Lab Results  Component Value Date   NA 134 (L) 09/08/2022   K 4.0 09/08/2022   CO2 21 (L) 09/08/2022   GLUCOSE 138 (H) 09/08/2022   BUN 13 09/08/2022   CREATININE 0.75 09/08/2022   CALCIUM 9.1 09/08/2022   GFRNONAA >60 09/08/2022   GFRAA 114 11/13/2018   Lipid Panel:  Lab Results  Component Value Date   LDLCALC 91 07/09/2022   HgbA1c:  Lab Results  Component Value Date   HGBA1C 5.8 07/30/2014   Urine Drug Screen:     Component Value Date/Time   LABOPIA NONE DETECTED 09/08/2022 1226   COCAINSCRNUR NONE DETECTED 09/08/2022 1226   LABBENZ NONE DETECTED 09/08/2022 1226   AMPHETMU NONE DETECTED 09/08/2022 1226   THCU NONE DETECTED 09/08/2022 1226   LABBARB NONE DETECTED 09/08/2022 1226    Alcohol Level     Component Value  Date/Time   ETH <10 09/08/2022 1235    CT Head without contrast(Personally reviewed): CTH was negative for a large hypodensity concerning for a large territory infarct or hyperdensity concerning for an ICH  CT angio Head and Neck with contrast: pending  MRI Brain(Personally reviewed): No acute infarct or other abnormality  Impression   Emma Stephens is a 52 y.o. female with PMH significant for pafibb not on AC, HTN, GERD, sleep apnea, who presents with episode of symptaomatic afibb along with 2 hours episode of LUE and LLE weakness with spontaneous return to baseline.  Her episode of left sided weakness is clinically concerning for a TIA. She has no stroke on MRI. With this TIA, her CHADsVASC score is now a 4 and thus warrants anticoagulation for her pafibb.  I discussed risks and benefits of eliquis with patient and her husband in detail. She does endorse hx of anemia, but denies any recent GI bleed, dark stools, no vaginal bleed, no falls.  Will plan to get CT angio head and neck on her to look for any residual thrombus/clot or significant stenosis/athero. Was a former smoker who quit back in August of last year.  Will also get lipid panel and hemoglobin A1c but she does not need to wait in the ED for these to result and can be followed up outpatient. If her LDL is over 70, would recommend starting her on Atorvastatin. Would also recommend that PCP check H&H within a couple weeks of starting eliquis.  Recommendations  - Recommend Vascular imaging with CT angio head and neck, this needs to result prior to discharge from the ED to ensure no residual thrombus, no significant stenosis or LVO. If this does show abnormality, she may need to stay in the hospital overnight. - given her history of pAfibb, getting a TTE is unlikely to change management from a stroke standpoint. Specially since we will be starting her on eliquis for elevated CHADSVASAC score. - Recommend obtaining Lipid panel  with LDL - Please start statin if LDL > 70, this can be done outpatient. - Recommend HbA1c to evaluate for diabetes and how well it is controlled. - Anticoagulation with Eliquis 5mg  BID for elevated CHADS2VASC score. She is not on antiplatelet at home. Pharmacy dispense report also reviewed. - Stroke education booklet - follow up with PCP within 2 weeks to check H&H and to see if LDL is above 70 and warrants starting  atorvastatin or other high intensity statin. - Follow up with Cardiology outpatient for symptomatic Afibb episode.  ______________________________________________________________________  Plan discussed with patient, her husband and with Fayrene Helper with the ED team.  Thank you for the opportunity to take part in the care of this patient. If you have any further questions, please contact the neurology consultation attending.  Signed,  Erick Blinks [facility] _ _ _   _ __   _ __ _ _  __ __   _ __   __ _

## 2022-09-08 NOTE — Discharge Instructions (Addendum)
You have been evaluated for your symptoms.  Your symptoms likely due to a transischemic attack likely related to atrial fibrillation. Fortunately no evidence of acute stroke.  Please take Eliquis as prescribed.  Follow-up closely with your primary care doctor for further management.  Incidentally, there is a 3 x 6 mm aneurysm involving your left internal carotid artery.  Discussed this with your primary care doctor as you would likely benefit from yearly surveillance with CT scan.  Follow-up with atrial fibrillation clinic for outpatient managements of your A-fib.  Return to the ER if you have any concern.

## 2022-09-08 NOTE — ED Notes (Signed)
Patient transported to CT 

## 2022-09-08 NOTE — ED Provider Notes (Signed)
Received patient care from transfer from [facility], please see previous providers note for complete H&P.  52 year old female with extensive medical history including hypertension, A-fib not on any blood thinner medication, anxiety, OSA, who presenting today with complaints of having sensation to her left upper extremity.  Last known normal was around 7 AM this morning.  Workup indicated atrial fibrillation with initial RVR but improved with Lopressor.  Head CT scan shows artifact versus infarct and therefore patient transferred here for MRI of the brain and likely neuro evaluation if positive.  At this time patient is resting comfortably appears to be in no acute discomfort.  Heart with normal rate and rhythm lungs clear to auscultation, equal strength to bilateral upper extremities with intact radial pulses.  -Labs ordered, independently viewed and interpreted by me.  Labs remarkable for reassuring value. -The patient was maintained on a cardiac monitor.  I personally viewed and interpreted the cardiac monitored which showed an underlying rhythm of: irregularly irregular -Imaging independently viewed and interpreted by me and I agree with radiologist's interpretation.  Result remarkable for brain MRI showing no acute finding -This patient presents to the ED for concern of weakness, this involves an extensive number of treatment options, and is a complaint that carries with it a high risk of complications and morbidity.  The differential diagnosis includes afib with RVR, anemia, stroke, acs, electrolytes derangement, hypoglycemia.  -Co morbidities that complicate the patient evaluation includes afib, GERD -Treatment includes Eliquis -Reevaluation of the patient after these medicines showed that the patient improved -PCP office notes or outside notes reviewed -Discussion with specialist on call neurologist Dr. Patrecia Pace -Escalation to admission/observation considered: patients feels much  better, is comfortable with discharge, and will follow up with PCP and cardiology -Prescription medication considered, patient comfortable with Eliquis -Social Determinant of Health considered    9:08 PM ABCD2 score of 4, moderate risk of stroke.  Chad2VASC score of 2 Will consult neuro  9:24 PM Patient consultation from on-call neurologist, Dr. Patrecia Pace who recommends Head/neck CTA, lipid panel and A1C. He will see pt in the ER and help with disposition. Pt is made aware of plan.   10:19 PM Neurologist recommend patient to follow-up closely with PCP and cardiology for outpatient management.  Patient needs to be started on Eliquis.  CT angiogram of the head and neck without any evidence of large vessel occlusions or high-grade stenosis.  Incidentally there is a 6 x 3 mm aneurysm projecting anteriorly from the supraclinoid left ICA.  I did discuss this with patient and recommend outpatient follow-up.  Patient likely benefit from surveillance CT yearly for close monitoring.  Patient discharged home with Eliquis.  .Critical Care  Performed by: Fayrene Helper, PA-C Authorized by: Fayrene Helper, PA-C   Critical care provider statement:    Critical care time (minutes):  30   Critical care was time spent personally by me on the following activities:  Development of treatment plan with patient or surrogate, discussions with consultants, evaluation of patient's response to treatment, examination of patient, ordering and review of laboratory studies, ordering and review of radiographic studies, ordering and performing treatments and interventions, pulse oximetry, re-evaluation of patient's condition and review of old charts  BP 118/71 (BP Location: Left Arm)   Pulse 72   Temp 98 F (36.7 C)   Resp 17   Ht 5\' 1"  (1.549 m)   Wt 74.9 kg   LMP 12/25/2021 (Exact Date)   SpO2 100%   BMI 31.20  kg/m   Results for orders placed or performed during the hospital encounter of 09/08/22  CBC with  Differential  Result Value Ref Range   WBC 8.5 4.0 - 10.5 K/uL   RBC 4.66 3.87 - 5.11 MIL/uL   Hemoglobin 12.9 12.0 - 15.0 g/dL   HCT 16.1 09.6 - 04.5 %   MCV 82.6 80.0 - 100.0 fL   MCH 27.7 26.0 - 34.0 pg   MCHC 33.5 30.0 - 36.0 g/dL   RDW 40.9 81.1 - 91.4 %   Platelets 434 (H) 150 - 400 K/uL   nRBC 0.0 0.0 - 0.2 %   Neutrophils Relative % 42 %   Neutro Abs 3.6 1.7 - 7.7 K/uL   Lymphocytes Relative 49 %   Lymphs Abs 4.2 (H) 0.7 - 4.0 K/uL   Monocytes Relative 5 %   Monocytes Absolute 0.5 0.1 - 1.0 K/uL   Eosinophils Relative 3 %   Eosinophils Absolute 0.2 0.0 - 0.5 K/uL   Basophils Relative 1 %   Basophils Absolute 0.1 0.0 - 0.1 K/uL   Immature Granulocytes 0 %   Abs Immature Granulocytes 0.01 0.00 - 0.07 K/uL  Comprehensive metabolic panel  Result Value Ref Range   Sodium 134 (L) 135 - 145 mmol/L   Potassium 4.0 3.5 - 5.1 mmol/L   Chloride 104 98 - 111 mmol/L   CO2 21 (L) 22 - 32 mmol/L   Glucose, Bld 138 (H) 70 - 99 mg/dL   BUN 13 6 - 20 mg/dL   Creatinine, Ser 7.82 0.44 - 1.00 mg/dL   Calcium 9.1 8.9 - 95.6 mg/dL   Total Protein 7.7 6.5 - 8.1 g/dL   Albumin 4.2 3.5 - 5.0 g/dL   AST 23 15 - 41 U/L   ALT 17 0 - 44 U/L   Alkaline Phosphatase 82 38 - 126 U/L   Total Bilirubin 0.5 0.3 - 1.2 mg/dL   GFR, Estimated >21 >30 mL/min   Anion gap 9 5 - 15  Ethanol  Result Value Ref Range   Alcohol, Ethyl (B) <10 <10 mg/dL  Protime-INR  Result Value Ref Range   Prothrombin Time 13.4 11.4 - 15.2 seconds   INR 1.0 0.8 - 1.2  APTT  Result Value Ref Range   aPTT 27 24 - 36 seconds  Urine rapid drug screen (hosp performed)  Result Value Ref Range   Opiates NONE DETECTED NONE DETECTED   Cocaine NONE DETECTED NONE DETECTED   Benzodiazepines NONE DETECTED NONE DETECTED   Amphetamines NONE DETECTED NONE DETECTED   Tetrahydrocannabinol NONE DETECTED NONE DETECTED   Barbiturates NONE DETECTED NONE DETECTED  Hemoglobin A1c  Result Value Ref Range   Hgb A1c MFr Bld 6.0 (H) 4.8  - 5.6 %   Mean Plasma Glucose 125.5 mg/dL  Lipid panel  Result Value Ref Range   Cholesterol 165 0 - 200 mg/dL   Triglycerides 53 <865 mg/dL   HDL 49 >78 mg/dL   Total CHOL/HDL Ratio 3.4 RATIO   VLDL 11 0 - 40 mg/dL   LDL Cholesterol 469 (H) 0 - 99 mg/dL  CBG monitoring, ED  Result Value Ref Range   Glucose-Capillary 120 (H) 70 - 99 mg/dL  Troponin I (High Sensitivity)  Result Value Ref Range   Troponin I (High Sensitivity) 5 <18 ng/L  Troponin I (High Sensitivity)  Result Value Ref Range   Troponin I (High Sensitivity) 7 <18 ng/L   CT ANGIO HEAD NECK W WO CM  Result Date: 09/08/2022  CLINICAL DATA:  Acute neurologic deficit.  Left-sided weakness. EXAM: CT ANGIOGRAPHY HEAD AND NECK WITH AND WITHOUT CONTRAST TECHNIQUE: Multidetector CT imaging of the head and neck was performed using the standard protocol during bolus administration of intravenous contrast. Multiplanar CT image reconstructions and MIPs were obtained to evaluate the vascular anatomy. Carotid stenosis measurements (when applicable) are obtained utilizing NASCET criteria, using the distal internal carotid diameter as the denominator. RADIATION DOSE REDUCTION: This exam was performed according to the departmental dose-optimization program which includes automated exposure control, adjustment of the mA and/or kV according to patient size and/or use of iterative reconstruction technique. CONTRAST:  75mL OMNIPAQUE IOHEXOL 350 MG/ML SOLN COMPARISON:  None Available. FINDINGS: CTA NECK FINDINGS SKELETON: There is ossification of the posterior longitudinal ligament at the C3-6 levels, mildly narrowing the spinal canal. C6-T1 anterior fusion with graft material. OTHER NECK: Normal pharynx, larynx and major salivary glands. No cervical lymphadenopathy. Unremarkable thyroid gland. UPPER CHEST: No pneumothorax or pleural effusion. No nodules or masses. AORTIC ARCH: There is calcific atherosclerosis of the aortic arch. There is no aneurysm,  dissection or hemodynamically significant stenosis of the visualized portion of the aorta. Conventional 3 vessel aortic branching pattern. The visualized proximal subclavian arteries are widely patent. RIGHT CAROTID SYSTEM: Normal without aneurysm, dissection or stenosis. LEFT CAROTID SYSTEM: Normal without aneurysm, dissection or stenosis. VERTEBRAL ARTERIES: Left dominant configuration. Both origins are clearly patent. There is no dissection, occlusion or flow-limiting stenosis to the skull base (V1-V3 segments). CTA HEAD FINDINGS POSTERIOR CIRCULATION: --Vertebral arteries: Normal V4 segments. --Inferior cerebellar arteries: Normal. --Basilar artery: Normal. --Superior cerebellar arteries: Normal. --Posterior cerebral arteries (PCA): Normal. ANTERIOR CIRCULATION: --Intracranial internal carotid arteries: There is a 6 x 3 mm aneurysm projecting anteriorly from the supraclinoid left ICA (sagittal series 9, image 124). There is mild atherosclerotic irregularity of both internal carotid artery cavernous segments, left-greater-than-right. --Anterior cerebral arteries (ACA): Normal. Both A1 segments are present. Patent anterior communicating artery (a-comm). --Middle cerebral arteries (MCA): Normal. VENOUS SINUSES: As permitted by contrast timing, patent. ANATOMIC VARIANTS: Fetal origin of the right posterior cerebral artery. Review of the MIP images confirms the above findings. IMPRESSION: 1. No emergent large vessel occlusion or high-grade stenosis of the intracranial arteries. 2. A 6 x 3 mm aneurysm projecting anteriorly from the supraclinoid left ICA. 3. 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 Electronically Signed   By: Deatra Robinson M.D.   On: 09/08/2022 23:28   MR BRAIN WO CONTRAST  Result Date: 09/08/2022 CLINICAL DATA:  Neuro deficit, acute, stroke suspected EXAM: MRI HEAD WITHOUT CONTRAST TECHNIQUE: Multiplanar, multiecho pulse sequences  of the brain and surrounding structures were obtained without intravenous contrast. COMPARISON:  CT head 09/08/22 FINDINGS: Brain: Negative for an acute infarct. No hemorrhage. No hydrocephalus. No extra-axial fluid collection. Sequela of mild chronic microvascular ischemic change. Vascular: Normal flow voids. Skull and upper cervical spine: Normal marrow signal. Sinuses/Orbits: No middle ear or mastoid effusion. Paranasal sinuses are clear. Orbits are unremarkable. Other: None IMPRESSION: No acute intracranial process. Electronically Signed   By: Lorenza Cambridge M.D.   On: 09/08/2022 19:30   DG Chest Portable 1 View  Result Date: 09/08/2022 CLINICAL DATA:  Shortness of breath EXAM: PORTABLE CHEST 1 VIEW COMPARISON:  X-ray 10/22/2014 FINDINGS: No consolidation, pneumothorax or effusion. Normal cardiopericardial silhouette without edema. Overlapping cardiac leads. Degenerative changes along the spine. Fixation hardware along the lower cervical spine. IMPRESSION: No acute cardiopulmonary disease. Fixation hardware along the  lower cervical spine. Electronically Signed   By: Karen Kays M.D.   On: 09/08/2022 13:26   CT Head Wo Contrast  Result Date: 09/08/2022 CLINICAL DATA:  Provided history: Neuro deficit, acute, stroke suspected. Left-sided weakness. EXAM: CT HEAD WITHOUT CONTRAST TECHNIQUE: Contiguous axial images were obtained from the base of the skull through the vertex without intravenous contrast. RADIATION DOSE REDUCTION: This exam was performed according to the departmental dose-optimization program which includes automated exposure control, adjustment of the mA and/or kV according to patient size and/or use of iterative reconstruction technique. COMPARISON:  No pertinent prior exams available for comparison. FINDINGS: Brain: No age advanced or lobar predominant parenchymal atrophy. Apparent hypodensity within portions of the cortical and subcortical left occipital lobe (for instance as seen on series  2, images 12-14) (series 5, image 35). There is no acute intracranial hemorrhage. No extra-axial fluid collection. No evidence of an intracranial mass. No midline shift. Vascular: No hyperdense vessel.  Atherosclerotic calcifications. Skull: No fracture or aggressive osseous lesion. Sinuses/Orbits: No mass or acute finding within the imaged orbits. Mild mucosal thickening within the left sphenoid sinus. IMPRESSION: 1. Apparent hypodensity within portions the cortical/subcortical left occipital lobe. This may reflect an acute infarct or artifact, and a brain MRI is recommended for further evaluation. 2. Otherwise unremarkable non-contrast CT appearance of the brain. 3. Atherosclerotic calcifications within the intracranial internal carotid and vertebral arteries. Electronically Signed   By: Jackey Loge D.O.   On: 09/08/2022 13:24   DG Abd 1 View  Result Date: 08/31/2022 CLINICAL DATA:  Abdominal pain, bloating, and constipation EXAM: ABDOMEN - 1 VIEW COMPARISON:  CT abdomen 02/19/2022 FINDINGS: Right upper quadrant clips from prior cholecystectomy. Unremarkable bowel gas pattern. Visualized lung bases appear clear. No significant abnormal calcifications are identified. IMPRESSION: 1. Nonobstructive bowel gas pattern. Electronically Signed   By: Gaylyn Rong M.D.   On: 08/31/2022 14:19       Fayrene Helper, PA-C 09/08/22 [id number]    Melene Plan, DO 09/09/22 1556

## 2022-09-08 NOTE — ED Provider Notes (Signed)
52 year old female hx HTN, afib not on AC, IDA, OSA, anxiety, here with palpitations. She typically takes her medication at the same time daily but she was recently switched to 3rd shift. She got home home from work around 0700, went to bed w/o taking her medication. She woke up around 0900-1000 and felt like she was having palpitations/chest heaviness. Felt like she was in afib, took her medication and went back to sleep. Woke up around 11a or so with persistent symptoms. Felt very anxious, had some heaviness to her LUE which she has experienced previosuly in conjunction with her afib when rate was elevated. Upon arrival to the ED she continues to have LUE weakness/heaviness but this has improved from the onset. Given improvement of symptoms she is not TNK candidate she is also VAN negative. Tele with afib, she is not on [facility], she is on dilt and beta blocker at home. Improvement to HR with lopressor. Symptoms continue to improve. CTH shows artifact vs infarct. Suspicion for CVA is reduced given near resolution of her symptoms. Legrand Rams this is likely symptomatic afib. Will plan to txfr to [facility] for MRI and neuro eval if +tive. Her labs are stable. Imaging o/w stable. She is agreeable to transfer to [facility] for further imaging.    CRITICAL CARE Performed by: Sloan Leiter   Total critical care time: 32 minutes  Critical care time was exclusive of separately billable procedures and treating other patients.  Critical care was necessary to treat or prevent imminent or life-threatening deterioration.  Critical care was time spent personally by me on the following activities: development of treatment plan with patient and/or surrogate as well as nursing, discussions with consultants, evaluation of patient's response to treatment, examination of patient, obtaining history from patient or surrogate, ordering and performing treatments and interventions, ordering and review of laboratory studies, ordering and review of  radiographic studies, pulse oximetry and re-evaluation of patient's condition. Care    Tanda Rockers A, DO 09/08/22 1433

## 2022-09-08 NOTE — ED Provider Notes (Signed)
[place] [facility] Provider Note   CSN: [id number] Arrival date & time: 09/08/22  1207     History  Chief Complaint  Patient presents with   Weakness    [facility] is a 52 y.o. female.  52 y.o female with a PMH of Afib not anticoagulated presents to the ED with a chief complaint of A-fib.  Patient does have underlying A-fib, not anticoagulated.  Reports that she has been taking her medication at different times over the last couple of days.  She is unsure whether she has been some of the doses of her Cardizem.  She endorses while she was driving here, she began to have some left-sided weakness, left upper extremity along with left lower extremity.  Patient also fell as she was having a difficulty articulating her symptoms that she was very short of breath.  She is tearful and anxious on arrival, is satting at 100% on room air, tachypneic with a heart rate in the 110s.  She also reports feeling short of breath.  Denies any headache, no vision changes, no nausea, no vomiting.  The history is provided by the patient.  Weakness Associated symptoms: no abdominal pain, no chest pain, no fever, no nausea, no shortness of breath and no vomiting        Home Medications Prior to Admission medications   Medication Sig Start Date End Date Taking? Authorizing Provider  Cholecalciferol (VITAMIN D-3 PO) Take 1,000 Units by mouth daily.   Yes [provider]  cyanocobalamin (VITAMIN B12) 1000 MCG tablet Take 1 tablet (1,000 mcg total) by mouth daily. 02/24/22  Yes Dahal, Melina Schools, MD  diltiazem (CARDIZEM CD) 240 MG 24 hr capsule Take 1 capsule (240 mg total) by mouth daily. 11/06/21  Yes Nche, Bonna Gains, NP  famotidine (PEPCID) 20 MG tablet Take 20 mg by mouth as needed for heartburn or indigestion.   Yes [provider]  losartan (COZAAR) 25 MG tablet Take 1 tablet (25 mg total) by mouth in the morning. 12/25/21  Yes Nche, Bonna Gains, NP  metoprolol succinate (TOPROL-XL) 25 MG 24 hr tablet TAKE 1 TABLET (25 MG TOTAL) BY MOUTH DAILY. 07/26/22  Yes Nche, Bonna Gains, NP  sertraline (ZOLOFT) 50 MG tablet Take 1 tablet (50 mg total) by mouth daily. 12/25/21  Yes Nche, Bonna Gains, NP  spironolactone (ALDACTONE) 25 MG tablet TAKE 1 TABLET (25 MG TOTAL) BY MOUTH DAILY. 08/05/22  Yes Nche, Bonna Gains, NP      Allergies    Lisinopril and Bupropion    Review of Systems   Review of Systems  Constitutional:  Negative for chills and fever.  HENT:  Negative for sore throat.   Respiratory:  Negative for shortness of breath.   Cardiovascular:  Negative for chest pain.  Gastrointestinal:  Negative for abdominal pain, nausea and vomiting.  Genitourinary:  Negative for flank pain.  Musculoskeletal:  Negative for back pain.  Skin:  Negative for pallor and wound.  Neurological:  Positive for weakness.  All other systems reviewed and are negative.   Physical Exam Updated Vital Signs BP 115/87   Pulse 95   Temp 98.2 F (36.8 C) (Oral)   Resp (!) 23   Ht 5\' 1"  (1.549 m)   Wt 74.9 kg   LMP 12/25/2021 (Exact Date)   SpO2 97%   BMI 31.20 kg/m  Physical Exam Vitals and nursing note reviewed.  Constitutional:      Appearance: Normal appearance.  HENT:     Head: Normocephalic and atraumatic.     Mouth/Throat:     Mouth: Mucous membranes are moist.  Cardiovascular:     Rate and Rhythm: Normal rate.  Pulmonary:     Effort: Pulmonary effort is normal.  Abdominal:     General: Abdomen is flat.  Musculoskeletal:     Cervical back: Normal range of motion and neck supple.  Skin:    General: Skin is warm and dry.  Neurological:     Mental Status: She is alert and oriented to person, place, and time.     Motor: Weakness present.     Comments: Alert, oriented, thought content appropriate. Speech fluent without evidence of aphasia but tearful on exam. Able to follow 2 step commands without difficulty.  Cranial Nerves:   II:  Peripheral visual fields grossly normal, pupils, round, reactive to light III,IV, VI: ptosis not present, extra-ocular motions intact bilaterally  V,VII: smile symmetric, facial light touch sensation equal VIII: hearing grossly normal bilaterally  IX,X: midline uvula rise  XI: bilateral shoulder shrug equal and strong XII: midline tongue extension  Motor:  5/5 RIGHT in upper and lower extremities bilaterally including strong and equal grip strength and dorsiflexion/plantar flexion 4/5 LEFT in upper and lower extremities bilateral including strong grip Sensory: light touch normal in all extremities.  Cerebellar: normal finger-to-nose with bilateral upper extremities, pronator drift negative Gait: normal gait and balance       ED Results / Procedures / Treatments   Labs (all labs ordered are listed, but only abnormal results are displayed) Labs Reviewed  CBC WITH DIFFERENTIAL/PLATELET - Abnormal; Notable for the following components:      Result Value   Platelets 434 (*)    Lymphs Abs 4.2 (*)    All other components within normal limits  COMPREHENSIVE METABOLIC PANEL - Abnormal; Notable for the following components:   Sodium 134 (*)    CO2 21 (*)    Glucose, Bld 138 (*)    All other components within normal limits  CBG MONITORING, ED - Abnormal; Notable for the following components:   Glucose-Capillary 120 (*)    All other components within normal limits  ETHANOL  PROTIME-INR  APTT  RAPID URINE DRUG SCREEN, HOSP PERFORMED  TROPONIN I (HIGH SENSITIVITY)  TROPONIN I (HIGH SENSITIVITY)    EKG EKG Interpretation  Date/Time:  Wednesday September 08 2022 12:15:25 EDT Ventricular Rate:  110 PR Interval:    QRS Duration: 72 QT Interval:  333 QTC Calculation: 451 R Axis:   36 Text Interpretation: Atrial fibrillation Ventricular premature complex no stemi Confirmed by Tanda Rockers ([id number]) on 09/08/2022 2:33:03 PM  Radiology DG Chest Portable 1 View  Result Date:  09/08/2022 CLINICAL DATA:  Shortness of breath EXAM: PORTABLE CHEST 1 VIEW COMPARISON:  X-ray 10/22/2014 FINDINGS: No consolidation, pneumothorax or effusion. Normal cardiopericardial silhouette without edema. Overlapping cardiac leads. Degenerative changes along the spine. Fixation hardware along the lower cervical spine. IMPRESSION: No acute cardiopulmonary disease. Fixation hardware along the lower cervical spine. Electronically Signed   By: Karen Kays M.D.   On: 09/08/2022 13:26   CT Head Wo Contrast  Result Date: 09/08/2022 CLINICAL DATA:  Provided history: Neuro deficit, acute, stroke suspected. Left-sided weakness. EXAM: CT HEAD WITHOUT CONTRAST TECHNIQUE: Contiguous axial images were obtained from the base of the skull through the vertex without intravenous contrast. RADIATION DOSE REDUCTION: This exam was performed according to the departmental dose-optimization program which includes automated exposure control, adjustment of the  mA and/or kV according to patient size and/or use of iterative reconstruction technique. COMPARISON:  No pertinent prior exams available for comparison. FINDINGS: Brain: No age advanced or lobar predominant parenchymal atrophy. Apparent hypodensity within portions of the cortical and subcortical left occipital lobe (for instance as seen on series 2, images 12-14) (series 5, image 35). There is no acute intracranial hemorrhage. No extra-axial fluid collection. No evidence of an intracranial mass. No midline shift. Vascular: No hyperdense vessel.  Atherosclerotic calcifications. Skull: No fracture or aggressive osseous lesion. Sinuses/Orbits: No mass or acute finding within the imaged orbits. Mild mucosal thickening within the left sphenoid sinus. IMPRESSION: 1. Apparent hypodensity within portions the cortical/subcortical left occipital lobe. This may reflect an acute infarct or artifact, and a brain MRI is recommended for further evaluation. 2. Otherwise unremarkable  non-contrast CT appearance of the brain. 3. Atherosclerotic calcifications within the intracranial internal carotid and vertebral arteries. Electronically Signed   By: Jackey Loge D.O.   On: 09/08/2022 13:24    Procedures Procedures    Medications Ordered in ED Medications  metoprolol tartrate (LOPRESSOR) injection 5 mg (5 mg Intravenous Given 09/08/22 1318)    ED Course/ Medical Decision Making/ A&P                             Medical Decision Making Amount and/or Complexity of Data Reviewed Labs: ordered. Radiology: ordered.  Risk Prescription drug management.   This patient presents to the ED for concern of afib, this involves a number of treatment options, and is a complaint that carries with it a high risk of complications and morbidity.  The differential diagnosis includes medication noncompliance, infection versus CVA.   Co morbidities: Discussed in HPI   Brief History:  See HPI  EMR reviewed including pt PMHx, past surgical history and past visits to ER.   See HPI for more details   Lab Tests:  I ordered and independently interpreted labs.  The pertinent results include:    I personally reviewed all laboratory work and imaging. Metabolic panel without any acute abnormality specifically kidney function within normal limits and no significant electrolyte abnormalities. CBC without leukocytosis or significant anemia.   Imaging Studies:  Xray of the chest showed: IMPRESSION:  No acute cardiopulmonary disease. Fixation hardware along the lower  cervical spine.   CT Head showed: IMPRESSION:  1. Apparent hypodensity within portions the cortical/subcortical  left occipital lobe. This may reflect an acute infarct or artifact,  and a brain MRI is recommended for further evaluation.  2. Otherwise unremarkable non-contrast CT appearance of the brain.  3. Atherosclerotic calcifications within the intracranial internal  carotid and vertebral arteries.     Cardiac Monitoring:  The patient was maintained on a cardiac monitor.  I personally viewed and interpreted the cardiac monitored which showed an underlying rhythm of: irregularly irregular  EKG non-ischemic   Medicines ordered:  I ordered medication including lopressor  for symptomatic treatment Reevaluation of the patient after these medicines showed that the patient improved I have reviewed the patients home medicines and have made adjustments as needed  Consults:  pending neurology consultation    Reevaluation:  After the interventions noted above I re-evaluated patient and found that they have :stayed the same   Social Determinants of Health:  The patient's social determinants of health were a factor in the care of this patient   Problem List / ED Course:  Patient presents to the ED  with a chief complaint of sudden onset of A-fib, underlying history of A-fib reports she took her medication at the wrong time today after she got off work at 7 AM she went to sleep.  Woke up and felt that her heart was racing, also felt very short of breath arrived to the ED with an EKG which showed irregularly irregular rhythm with a heart rate in the 110's.  Interpretation of her blood work by me reveal a CBC with no leukocytosis, hemoglobin is within normal limits.  CMP with slight decrease in sodium, creatinine levels unremarkable.  LFTs are within normal limits.  PT/INR are normal, she is not anticoagulated currently.  Troponin is negative, ethanol levels negative.  UDS is also negative.  Chest x-ray without any signs of anemia. Outpatient patient is describing left arm weakness, I did do a full neuroexam of her she does appear diminished on her left arm, no code stroke was activated after I discussed this case with my attending Dr. Wallace Cullens.  A CT head was ordered, did show artifact versus acute infarct.  Symptoms are somewhat improving after her receiving her Lopressor 5 mg. She was also  evaluate by my attending Dr. Juanita Craver who agrees with transfer to [facility] ED for MRI. Spoke to Dr. Adela Lank who accepts patient for transfer via carelink.    Dispostion:  After consideration of the diagnostic results and the patients response to treatment, I feel that the patent would benefit from MRI Brain per CT head read.   Portions of this note were generated with Scientist, clinical (histocompatibility and immunogenetics). Dictation errors may occur despite best attempts at proofreading.   Final Clinical Impression(s) / ED Diagnoses Final diagnoses:  Weakness  Atrial fibrillation, unspecified type [facility])    Rx / DC Orders ED Discharge Orders     None         Claude Manges, PA-C 09/08/22 1504    Sloan Leiter, DO 09/10/22 [phone]

## 2022-09-08 NOTE — ED Notes (Signed)
Provider notified of patients symptoms.  Pt tearful and anxious

## 2022-09-08 NOTE — ED Triage Notes (Signed)
Paitent transferred to [facility] from [facility] for a MRI by carelink. Carelink states NIH was 4 and is now 0.

## 2022-09-08 NOTE — ED Notes (Signed)
Patient transported to MRI 

## 2022-09-08 NOTE — ED Notes (Signed)
[facility] called for patient to be transported to [facility] for MRI with accepting Adela Lank MD

## 2022-09-08 NOTE — ED Notes (Signed)
Discharge instructions discussed with pt. Verbalized understanding. VSS. No questions or concerns regarding discharge  

## 2022-09-08 NOTE — ED Triage Notes (Signed)
Pt reports she thinks she is in afib; sts on her way here, felt LT side weakness  (LUE, LLE); pt having difficulty articulating sxs

## 2022-09-09 ENCOUNTER — Other Ambulatory Visit: Payer: Self-pay | Admitting: Neurology

## 2022-09-09 ENCOUNTER — Encounter: Payer: Self-pay | Admitting: Neurology

## 2022-09-09 ENCOUNTER — Telehealth: Payer: Self-pay

## 2022-09-09 DIAGNOSIS — I729 Aneurysm of unspecified site: Secondary | ICD-10-CM

## 2022-09-09 NOTE — Transitions of Care (Post Inpatient/ED Visit) (Signed)
   09/09/2022  Name: Aleigh Witkowski MRN: [id number] DOB: 07-May-1970  Today's TOC FU Call Status: Today's TOC FU Call Status:: Unsuccessul Call (1st Attempt) Unsuccessful Call (1st Attempt) Date: 09/09/22  Attempted to reach the patient regarding the most recent Inpatient/ED visit.  Follow Up Plan: Additional outreach attempts will be made to reach the patient to complete the Transitions of Care (Post Inpatient/ED visit) call.   Signature Arvil Persons, BSN, Charity fundraiser

## 2022-09-10 ENCOUNTER — Ambulatory Visit: Payer: BC Managed Care – PPO | Admitting: Nurse Practitioner

## 2022-09-10 ENCOUNTER — Encounter: Payer: Self-pay | Admitting: Nurse Practitioner

## 2022-09-10 VITALS — BP 124/78 | HR 76 | Temp 97.5°F | Resp 16 | Ht 61.0 in | Wt 165.0 lb

## 2022-09-10 DIAGNOSIS — I480 Paroxysmal atrial fibrillation: Secondary | ICD-10-CM | POA: Diagnosis not present

## 2022-09-10 DIAGNOSIS — E119 Type 2 diabetes mellitus without complications: Secondary | ICD-10-CM | POA: Insufficient documentation

## 2022-09-10 DIAGNOSIS — I10 Essential (primary) hypertension: Secondary | ICD-10-CM | POA: Diagnosis not present

## 2022-09-10 DIAGNOSIS — I671 Cerebral aneurysm, nonruptured: Secondary | ICD-10-CM

## 2022-09-10 DIAGNOSIS — R7303 Prediabetes: Secondary | ICD-10-CM | POA: Diagnosis not present

## 2022-09-10 DIAGNOSIS — Z9889 Other specified postprocedural states: Secondary | ICD-10-CM

## 2022-09-10 HISTORY — DX: Type 2 diabetes mellitus without complications: E11.9

## 2022-09-10 MED ORDER — APIXABAN 5 MG PO TABS
5.0000 mg | ORAL_TABLET | Freq: Two times a day (BID) | ORAL | 5 refills | Status: DC
Start: 2022-09-10 — End: 2023-02-24

## 2022-09-10 NOTE — Assessment & Plan Note (Addendum)
Per CT angio neck 08/2022: No emergent large vessel occlusion or high-grade stenosis of the intracranial arteries. 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  She quit tobacco use 10months ago BP at goal with metoprolol, losartan and spironolactone hgbA1c at 6.0%: repeat in 3months, advised about need for diet modifications and daily exercise  F/up with cardiology on 10/26/2022

## 2022-09-10 NOTE — Assessment & Plan Note (Signed)
>>  ASSESSMENT AND PLAN FOR ANEURYSM, CAROTID ARTERY, INTERNAL WRITTEN ON 09/10/2022 12:36 PM BY Anzlee Hinesley LUM, NP  Per CT angio neck 08/2022: No emergent large vessel occlusion or high-grade stenosis of the intracranial arteries. 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  She quit tobacco use 10months ago BP at goal with metoprolol , losartan  and spironolactone  hgbA1c at 6.0%: repeat in 3months, advised about need for diet modifications and daily exercise  F/up with cardiology on 10/26/2022

## 2022-09-10 NOTE — Transitions of Care (Post Inpatient/ED Visit) (Signed)
   09/10/2022  Name: [facility] MRN: [id number] DOB: 1970/08/22  Today's TOC FU Call Status: Today's TOC FU Call Status:: Successful TOC FU Call Competed Unsuccessful Call (1st Attempt) Date: 09/10/22 [facility] FU Call Complete Date: 09/10/22  Transition Care Management Follow-up Telephone Call Date of Discharge: 09/08/22 Discharge Facility: [facility]) Type of Discharge: Emergency Department How have you been since you were released from the hospital?: Better Any questions or concerns?: No  Items Reviewed: Did you receive and understand the discharge instructions provided?: Yes Any new allergies since your discharge?: No Dietary orders reviewed?: NA Do you have support at home?: Yes  Medications Reviewed Today: Medications Reviewed Today     Reviewed by Larey Dresser, RN (Registered Nurse) on 09/10/22 at [phone]  Med List Status: <None>   Medication Order Taking? Sig Documenting Provider Last Dose Status Informant  0.9 %  sodium chloride infusion [id number]   Mansouraty, Netty Starring., MD  Active   apixaban (ELIQUIS) 5 MG TABS tablet [id number]  Take 1 tablet (5 mg total) by mouth 2 (two) times daily. Fayrene Helper, PA-C  Active   Cholecalciferol (VITAMIN D-3 PO) [id number] No Take 1,000 Units by mouth daily. [provider] Past Week Active Self, Pharmacy Records  cyanocobalamin (VITAMIN B12) 1000 MCG tablet [id number] No Take 1 tablet (1,000 mcg total) by mouth daily. Lorin Glass, MD unknown Active Self, Pharmacy Records  diltiazem (CARDIZEM CD) 240 MG 24 hr capsule [id number] No Take 1 capsule (240 mg total) by mouth daily. Anne Ng, NP 09/08/2022 Active Self, Pharmacy Records  famotidine (PEPCID) 20 MG tablet [id number] No Take 20 mg by mouth as needed for heartburn or indigestion. [provider] unknown Active Self, Pharmacy Records  losartan (COZAAR) 25 MG tablet [id number] No Take 1 tablet (25 mg total) by mouth in the morning. Anne Ng, NP 09/08/2022 Active Self, Pharmacy Records  metoprolol succinate (TOPROL-XL) 25 MG 24 hr tablet [id number] No TAKE 1 TABLET (25 MG TOTAL) BY MOUTH DAILY. Anne Ng, NP 09/08/2022 0900 Active Self, Pharmacy Records  sertraline (ZOLOFT) 50 MG tablet [id number] No Take 1 tablet (50 mg total) by mouth daily. Anne Ng, NP 09/08/2022 Active Self, Pharmacy Records  spironolactone (ALDACTONE) 25 MG tablet [id number] No TAKE 1 TABLET (25 MG TOTAL) BY MOUTH DAILY. Anne Ng, NP 09/08/2022 Active Self, Pharmacy Records            Home Care and Equipment/Supplies: Were Home Health Services Ordered?: NA Any new equipment or medical supplies ordered?: NA  Functional Questionnaire: Do you need assistance with bathing/showering or dressing?: No Do you need assistance with meal preparation?: No Do you need assistance with eating?: No Do you have difficulty maintaining continence: No Do you need assistance with getting out of bed/getting out of a chair/moving?: No Do you have difficulty managing or taking your medications?: No  Follow up appointments reviewed: PCP Follow-up appointment confirmed?: Yes Date of PCP follow-up appointment?: 09/10/22 Specialist Hospital Follow-up appointment confirmed?: Yes Date of Specialist follow-up appointment?: 09/13/22 Do you need transportation to your follow-up appointment?: No Do you understand care options if your condition(s) worsen?: Yes-patient verbalized understanding    SIGNATURE Arvil Persons, BSN, RN

## 2022-09-10 NOTE — Assessment & Plan Note (Addendum)
hgbA1c at 6.0% She agreed to nutrition referral Repeat in 90days

## 2022-09-10 NOTE — Patient Instructions (Signed)
Maintain appointment with cardiology  Diabetes Mellitus and Nutrition, Adult When you have diabetes, or diabetes mellitus, it is very important to have healthy eating habits because your blood sugar (glucose) levels are greatly affected by what you eat and drink. Eating healthy foods in the right amounts, at about the same times every day, can help you: Manage your blood glucose. Lower your risk of heart disease. Improve your blood pressure. Reach or maintain a healthy weight. What can affect my meal plan? Every person with diabetes is different, and each person has different needs for a meal plan. Your health care provider may recommend that you work with a dietitian to make a meal plan that is best for you. Your meal plan may vary depending on factors such as: The calories you need. The medicines you take. Your weight. Your blood glucose, blood pressure, and cholesterol levels. Your activity level. Other health conditions you have, such as heart or kidney disease. How do carbohydrates affect me? Carbohydrates, also called carbs, affect your blood glucose level more than any other type of food. Eating carbs raises the amount of glucose in your blood. It is important to know how many carbs you can safely have in each meal. This is different for every person. Your dietitian can help you calculate how many carbs you should have at each meal and for each snack. How does alcohol affect me? Alcohol can cause a decrease in blood glucose (hypoglycemia), especially if you use insulin or take certain diabetes medicines by mouth. Hypoglycemia can be a life-threatening condition. Symptoms of hypoglycemia, such as sleepiness, dizziness, and confusion, are similar to symptoms of having too much alcohol. Do not drink alcohol if: Your health care provider tells you not to drink. You are pregnant, may be pregnant, or are planning to become pregnant. If you drink alcohol: Limit how much you have to: 0-1  drink a day for women. 0-2 drinks a day for men. Know how much alcohol is in your drink. In the [place] one drink equals one 12 oz bottle of beer (355 mL), one 5 oz glass of wine (148 mL), or one 1 oz glass of hard liquor (44 mL). Keep yourself hydrated with water, diet soda, or unsweetened iced tea. Keep in mind that regular soda, juice, and other mixers may contain a lot of sugar and must be counted as carbs. What are tips for following this plan?  Reading food labels Start by checking the serving size on the Nutrition Facts label of packaged foods and drinks. The number of calories and the amount of carbs, fats, and other nutrients listed on the label are based on one serving of the item. Many items contain more than one serving per package. Check the total grams (g) of carbs in one serving. Check the number of grams of saturated fats and trans fats in one serving. Choose foods that have a low amount or none of these fats. Check the number of milligrams (mg) of salt (sodium) in one serving. Most people should limit total sodium intake to less than 2,300 mg per day. Always check the nutrition information of foods labeled as "low-fat" or "nonfat." These foods may be higher in added sugar or refined carbs and should be avoided. Talk to your dietitian to identify your daily goals for nutrients listed on the label. Shopping Avoid buying canned, pre-made, or processed foods. These foods tend to be high in fat, sodium, and added sugar. Shop around the outside edge of the grocery  store. This is where you will most often find fresh fruits and vegetables, bulk grains, fresh meats, and fresh dairy products. Cooking Use low-heat cooking methods, such as baking, instead of high-heat cooking methods, such as deep frying. Cook using healthy oils, such as olive, canola, or sunflower oil. Avoid cooking with butter, cream, or high-fat meats. Meal planning Eat meals and snacks regularly, preferably at the same  times every day. Avoid going long periods of time without eating. Eat foods that are high in fiber, such as fresh fruits, vegetables, beans, and whole grains. Eat 4-6 oz (112-168 g) of lean protein each day, such as lean meat, chicken, fish, eggs, or tofu. One ounce (oz) (28 g) of lean protein is equal to: 1 oz (28 g) of meat, chicken, or fish. 1 egg.  cup (62 g) of tofu. Eat some foods each day that contain healthy fats, such as avocado, nuts, seeds, and fish. What foods should I eat? Fruits Berries. Apples. Oranges. Peaches. Apricots. Plums. Grapes. Mangoes. Papayas. Pomegranates. Kiwi. Cherries. Vegetables Leafy greens, including lettuce, spinach, kale, chard, collard greens, mustard greens, and cabbage. Beets. Cauliflower. Broccoli. Carrots. Green beans. Tomatoes. Peppers. Onions. Cucumbers. Brussels sprouts. Grains Whole grains, such as whole-wheat or whole-grain bread, crackers, tortillas, cereal, and pasta. Unsweetened oatmeal. Quinoa. Brown or wild rice. Meats and other proteins Seafood. Poultry without skin. Lean cuts of poultry and beef. Tofu. Nuts. Seeds. Dairy Low-fat or fat-free dairy products such as milk, yogurt, and cheese. The items listed above may not be a complete list of foods and beverages you can eat and drink. Contact a dietitian for more information. What foods should I avoid? Fruits Fruits canned with syrup. Vegetables Canned vegetables. Frozen vegetables with butter or cream sauce. Grains Refined white flour and flour products such as bread, pasta, snack foods, and cereals. Avoid all processed foods. Meats and other proteins Fatty cuts of meat. Poultry with skin. Breaded or fried meats. Processed meat. Avoid saturated fats. Dairy Full-fat yogurt, cheese, or milk. Beverages Sweetened drinks, such as soda or iced tea. The items listed above may not be a complete list of foods and beverages you should avoid. Contact a dietitian for more information. Questions  to ask a health care provider Do I need to meet with a certified diabetes care and education specialist? Do I need to meet with a dietitian? What number can I call if I have questions? When are the best times to check my blood glucose? Where to find more information: American Diabetes Association: diabetes.org Academy of Nutrition and Dietetics: eatright.[employer] of Diabetes and Digestive and Kidney Diseases: StageSync.si Association of Diabetes Care & Education Specialists: diabeteseducator.org Summary It is important to have healthy eating habits because your blood sugar (glucose) levels are greatly affected by what you eat and drink. It is important to use alcohol carefully. A healthy meal plan will help you manage your blood glucose and lower your risk of heart disease. Your health care provider may recommend that you work with a dietitian to make a meal plan that is best for you. This information is not intended to replace advice given to you by your health care provider. Make sure you discuss any questions you have with your health care provider. Document Revised: 10/10/2019 Document Reviewed: 10/10/2019 Elsevier Patient Education  2024 [employer].

## 2022-09-10 NOTE — Assessment & Plan Note (Addendum)
She reports intermittent palpitations and irregular rhythms prior to ED visit. She Woke up with chest pain and rapid heart rate on 09/08/22. This was associated with left arm weakness. This led to ED visit. ED visit revealed A-fib with RVR. CTA-neck revealed Intracranial internal carotid arteries: There is a 6 x 3 mm aneurysm projecting anteriorly from the supraclinoid left ICA (sagittal series 9, image 124). There is mild atherosclerotic irregularity of both internal carotid artery cavernous segments,left-greater-than-right.  She was discharge with diltiazem and eliquis. Metoprolol, spironolactone and losartan doses were maintained She has upcoming appointment with A-fib clinic 09/13/22 and appointment with cardiology 10/26/22. Today she denies any chest pain or focal weakness or acute bleeding or ABDOMEN pain She is in NSR with normal rate today, resolved left side weakness She had CHADs2 score of 3 Advised to maintain eliquis dose, avoid all OVER THE COUNTER NSAIDs, and signs of acute bleeding.

## 2022-09-10 NOTE — Progress Notes (Signed)
Established Patient Visit  Patient: Emma Stephens   DOB: 05-20-1970   52 y.o. Female  MRN: [id number] Visit Date: 09/10/2022  Subjective:    Chief Complaint  Patient presents with   Hospitalization Follow-up    Follow up   HPI Paroxysmal atrial fibrillation [facility]) She reports intermittent palpitations and irregular rhythms prior to ED visit. She Woke up with chest pain and rapid heart rate on 09/08/22. This was associated with left arm weakness. This led to ED visit. ED visit revealed A-fib with RVR. CTA-neck revealed Intracranial internal carotid arteries: There is a 6 x 3 mm aneurysm projecting anteriorly from the supraclinoid left ICA (sagittal series 9, image 124). There is mild atherosclerotic irregularity of both internal carotid artery cavernous segments,left-greater-than-right.  She was discharge with diltiazem and eliquis. Metoprolol, spironolactone and losartan doses were maintained She has upcoming appointment with A-fib clinic 09/13/22 and appointment with cardiology 10/26/22. Today she denies any chest pain or focal weakness or acute bleeding or ABDOMEN pain She is in NSR with normal rate today, resolved left side weakness She had CHADs2 score of 3 Advised to maintain eliquis dose, avoid all OVER THE COUNTER NSAIDs, and signs of acute bleeding.   Aneurysm, carotid artery, internal Per CT angio neck 08/2022: No emergent large vessel occlusion or high-grade stenosis of the intracranial arteries. 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  She quit tobacco use 10months ago BP at goal with metoprolol, losartan and spironolactone hgbA1c at 6.0%: repeat in 3months, advised about need for diet modifications and daily exercise  F/up with cardiology on 10/26/2022  Prediabetes hgbA1c at 6.0% She agreed to nutrition  referral Repeat in 90days  Resolved GI symptoms.  Reviewed medical, surgical, and social history today  Medications: Outpatient Medications Prior to Visit  Medication Sig   Cholecalciferol (VITAMIN D-3 PO) Take 1,000 Units by mouth daily.   cyanocobalamin (VITAMIN B12) 1000 MCG tablet Take 1 tablet (1,000 mcg total) by mouth daily.   diltiazem (CARDIZEM CD) 240 MG 24 hr capsule Take 1 capsule (240 mg total) by mouth daily.   famotidine (PEPCID) 20 MG tablet Take 20 mg by mouth as needed for heartburn or indigestion.   losartan (COZAAR) 25 MG tablet Take 1 tablet (25 mg total) by mouth in the morning.   metoprolol succinate (TOPROL-XL) 25 MG 24 hr tablet TAKE 1 TABLET (25 MG TOTAL) BY MOUTH DAILY.   sertraline (ZOLOFT) 50 MG tablet Take 1 tablet (50 mg total) by mouth daily.   spironolactone (ALDACTONE) 25 MG tablet TAKE 1 TABLET (25 MG TOTAL) BY MOUTH DAILY.   [DISCONTINUED] apixaban (ELIQUIS) 5 MG TABS tablet Take 1 tablet (5 mg total) by mouth 2 (two) times daily.   Facility-Administered Medications Prior to Visit  Medication Dose Route Frequency Provider   0.9 %  sodium chloride infusion  500 mL Intravenous Once Mansouraty, Netty Starring., MD   Reviewed past medical and social history.   ROS per HPI above  Last CBC Lab Results  Component Value Date   WBC 8.5 09/08/2022   HGB 12.9 09/08/2022   HCT 38.5 09/08/2022   MCV 82.6 09/08/2022   MCH 27.7 09/08/2022   RDW 13.9 09/08/2022   PLT 434 (H) 09/08/2022   Last metabolic panel Lab Results  Component Value Date   GLUCOSE  138 (H) 09/08/2022   NA 134 (L) 09/08/2022   K 4.0 09/08/2022   CL 104 09/08/2022   CO2 21 (L) 09/08/2022   BUN 13 09/08/2022   CREATININE 0.75 09/08/2022   GFRNONAA >60 09/08/2022   CALCIUM 9.1 09/08/2022   PHOS 4.1 02/20/2022   PROT 7.7 09/08/2022   ALBUMIN 4.2 09/08/2022   BILITOT 0.5 09/08/2022   ALKPHOS 82 09/08/2022   AST 23 09/08/2022   ALT 17 09/08/2022   ANIONGAP 9 09/08/2022   Last  lipids Lab Results  Component Value Date   CHOL 165 09/08/2022   HDL 49 09/08/2022   LDLCALC 105 (H) 09/08/2022   TRIG 53 09/08/2022   CHOLHDL 3.4 09/08/2022   Last hemoglobin A1c Lab Results  Component Value Date   HGBA1C 6.0 (H) 09/08/2022   Last thyroid functions Lab Results  Component Value Date   TSH 0.69 08/08/2020        Objective:  BP 124/78 (BP Location: Left Arm, Patient Position: Sitting, Cuff Size: Normal)   Pulse 76   Temp (!) 97.5 F (36.4 C) (Oral)   Resp 16   Ht 5\' 1"  (1.549 m)   Wt 165 lb (74.8 kg)   LMP 12/25/2021 (Exact Date)   SpO2 95%   BMI 31.18 kg/m      Physical Exam Cardiovascular:     Rate and Rhythm: Normal rate and regular rhythm.     Pulses: Normal pulses.     Heart sounds: Normal heart sounds.  Pulmonary:     Effort: Pulmonary effort is normal.     Breath sounds: Normal breath sounds.  Musculoskeletal:        General: Normal range of motion.     Right lower leg: No edema.     Left lower leg: No edema.  Neurological:     Mental Status: She is alert and oriented to person, place, and time.     No results found for any visits on 09/10/22.    Assessment & Plan:    Problem List Items Addressed This Visit       Cardiovascular and Mediastinum   Aneurysm, carotid artery, internal    Per CT angio neck 08/2022: No emergent large vessel occlusion or high-grade stenosis of the intracranial arteries. 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  She quit tobacco use 10months ago BP at goal with metoprolol, losartan and spironolactone hgbA1c at 6.0%: repeat in 3months, advised about need for diet modifications and daily exercise  F/up with cardiology on 10/26/2022      Relevant Medications   apixaban (ELIQUIS) 5 MG TABS tablet   Essential hypertension, benign   Relevant Medications   apixaban  (ELIQUIS) 5 MG TABS tablet   Paroxysmal atrial fibrillation (HCC) - Primary    She reports intermittent palpitations and irregular rhythms prior to ED visit. She Woke up with chest pain and rapid heart rate on 09/08/22. This was associated with left arm weakness. This led to ED visit. ED visit revealed A-fib with RVR. CTA-neck revealed Intracranial internal carotid arteries: There is a 6 x 3 mm aneurysm projecting anteriorly from the supraclinoid left ICA (sagittal series 9, image 124). There is mild atherosclerotic irregularity of both internal carotid artery cavernous segments,left-greater-than-right.  She was discharge with diltiazem and eliquis. Metoprolol, spironolactone and losartan doses were maintained She has upcoming appointment with A-fib clinic 09/13/22 and  appointment with cardiology 10/26/22. Today she denies any chest pain or focal weakness or acute bleeding or ABDOMEN pain She is in NSR with normal rate today, resolved left side weakness She had CHADs2 score of 3 Advised to maintain eliquis dose, avoid all OVER THE COUNTER NSAIDs, and signs of acute bleeding.       Relevant Medications   apixaban (ELIQUIS) 5 MG TABS tablet     Other   Prediabetes    hgbA1c at 6.0% She agreed to nutrition referral Repeat in 90days      Relevant Orders   Referral to Nutrition and Diabetes Services   Return in about 3 months (around 12/11/2022) for prediabetes and , HTN, hyperlipidemia (fasting).     Alysia Penna, NP

## 2022-09-10 NOTE — Transitions of Care (Post Inpatient/ED Visit) (Signed)
   09/10/2022  Name: Emma Stephens MRN: [id number] DOB: [birth year] Today's TOC FU Call Status: Today's TOC FU Call Status:: Unsuccessful Call (2nd Attempt) Unsuccessful Call (1st Attempt) Date: 09/10/22  Attempted to reach the patient regarding the most recent Inpatient/ED visit.  Follow Up Plan: No further outreach attempts will be made at this time. We have been unable to contact the patient.  Signature Arvil Persons, BSN, Charity fundraiser

## 2022-09-13 ENCOUNTER — Ambulatory Visit (HOSPITAL_COMMUNITY)
Admission: RE | Admit: 2022-09-13 | Discharge: 2022-09-13 | Disposition: A | Discharge status: Home / Self Care | Payer: BC Managed Care – PPO | Source: Ambulatory Visit | Attending: Internal Medicine | Admitting: Internal Medicine

## 2022-09-13 VITALS — BP 150/84 | HR 74 | Ht 61.0 in | Wt 165.2 lb

## 2022-09-13 DIAGNOSIS — I480 Paroxysmal atrial fibrillation: Secondary | ICD-10-CM | POA: Diagnosis not present

## 2022-09-13 DIAGNOSIS — D6869 Other thrombophilia: Secondary | ICD-10-CM | POA: Diagnosis not present

## 2022-09-13 DIAGNOSIS — Z7901 Long term (current) use of anticoagulants: Secondary | ICD-10-CM | POA: Diagnosis not present

## 2022-09-13 DIAGNOSIS — I700 Atherosclerosis of aorta: Secondary | ICD-10-CM | POA: Insufficient documentation

## 2022-09-13 DIAGNOSIS — I10 Essential (primary) hypertension: Secondary | ICD-10-CM | POA: Insufficient documentation

## 2022-09-13 NOTE — Progress Notes (Signed)
Primary Care Physician: Emma Etienne Bonna Gains, NP Primary Cardiologist: None Electrophysiologist: Sherryl Manges, MD     Referring Physician: ED     Emma Stephens is a 52 y.o. female with a history of HTN, PVCs, OSA, and paroxysmal atrial fibrillation who presents for consultation in the [facility].  The patient was initially diagnosed with atrial fibrillation in April 2020. Seen recently in [facility] ED 09/08/22 found to be in Afib with RVR; symptoms of chest pressure and palpitations. Transferred to [facility] for neurologic symptoms with negative head CT. Currently on Toprol and diltiazem. Patient is on Eliquis 5 mg BID for a CHADS2VASC score of 2.  On evaluation today, she is currently in NSR. Patient attributes recent Afib episode due to switch from day to night shift and temporary delay in dosages according to when she's able to take her medicine. She has not had episodes this year as severe as the one that led to ED visit. She has not missed any doses of Eliquis. She has insurance currently and is very interested in CPAP therapy for history of severe sleep apnea.   Today, she denies symptoms of palpitations, chest pain, shortness of breath, orthopnea, PND, lower extremity edema, dizziness, presyncope, syncope, snoring, daytime somnolence, bleeding, or neurologic sequela. The patient is tolerating medications without difficulties and is otherwise without complaint today.    Atrial Fibrillation Risk Factors:  she does have symptoms or diagnosis of sleep apnea. she is not compliant with CPAP therapy.   she has a BMI of Body mass index is 31.21 kg/m.Marland Kitchen Filed Weights   09/13/22 0903  Weight: 74.9 kg    Current Outpatient Medications  Medication Sig Dispense Refill   apixaban (ELIQUIS) 5 MG TABS tablet Take 1 tablet (5 mg total) by mouth 2 (two) times daily. 60 tablet 5   Cholecalciferol (VITAMIN D-3 PO) Take 1,000 Units by mouth daily.     cyanocobalamin  (VITAMIN B12) 1000 MCG tablet Take 1 tablet (1,000 mcg total) by mouth daily.     diltiazem (CARDIZEM CD) 240 MG 24 hr capsule Take 1 capsule (240 mg total) by mouth daily. 90 capsule 3   famotidine (PEPCID) 20 MG tablet Take 20 mg by mouth as needed for heartburn or indigestion.     losartan (COZAAR) 25 MG tablet Take 1 tablet (25 mg total) by mouth in the morning. 90 tablet 3   metoprolol succinate (TOPROL-XL) 25 MG 24 hr tablet TAKE 1 TABLET (25 MG TOTAL) BY MOUTH DAILY. 90 tablet 3   sertraline (ZOLOFT) 50 MG tablet Take 1 tablet (50 mg total) by mouth daily. 90 tablet 3   spironolactone (ALDACTONE) 25 MG tablet TAKE 1 TABLET (25 MG TOTAL) BY MOUTH DAILY. 90 tablet 1   Current Facility-Administered Medications  Medication Dose Route Frequency Provider Last Rate Last Admin   0.9 %  sodium chloride infusion  500 mL Intravenous Once Mansouraty, Netty Starring., MD        Atrial Fibrillation Management history:  Previous antiarrhythmic drugs: None Previous cardioversions: None Previous ablations: None Anticoagulation history: Eliquis 5 mg BID   ROS- All systems are reviewed and negative except as per the HPI above.  Physical Exam: BP (!) 150/84   Pulse 74   Ht 5\' 1"  (1.549 m)   Wt 74.9 kg   LMP 12/25/2021 (Exact Date)   BMI 31.21 kg/m   GEN: Well nourished, well developed in no acute distress NECK: No JVD; No carotid bruits CARDIAC:  Regular rate and rhythm, no murmurs, rubs, gallops RESPIRATORY:  Clear to auscultation without rales, wheezing or rhonchi  ABDOMEN: Soft, non-tender, non-distended EXTREMITIES:  No edema; No deformity   EKG today demonstrates  Vent. rate 74 BPM PR interval 152 ms QRS duration 68 ms QT/QTcB 374/415 ms P-R-T axes 56 10 45 Normal sinus rhythm Anterior infarct , age undetermined Abnormal ECG When compared with ECG of 08-Sep-2022 12:15, PREVIOUS ECG IS PRESENT  Echo 09/19/2018 demonstrated   1. The left ventricle has hyperdynamic systolic  function, with an  ejection fraction of >65%. The cavity size was normal. Left ventricular  diastolic Doppler parameters are consistent with impaired relaxation. No  evidence of left ventricular regional wall   motion abnormalities.   2. The right ventricle has normal systolic function. The cavity was  normal. There is no increase in right ventricular wall thickness.   3. The aortic root is normal in size and structure.    ASSESSMENT & PLAN CHA2DS2-VASc Score = 3  The patient's score is based upon: CHF History: 0 HTN History: 1 Diabetes History: 0 Stroke History: 0 Vascular Disease History: 1 Age Score: 0 Gender Score: 1       ASSESSMENT AND PLAN: Paroxysmal Atrial Fibrillation (ICD10:  I48.0) The patient's CHA2DS2-VASc score is 3, indicating a 3.2% annual risk of stroke.    We discussed management of Afib which for patient can include AAD options as well as ablation. Currently, we will proceed with conservative observation. Patient states overall when she takes her medication consistently (back in day shift) she feels like her burden is pretty low. Rhythm monitoring device recommended - she will show Afib burden to [place] in August with device.  Continue current medication regimen without change.  Secondary Hypercoagulable State (ICD10:  D68.69) The patient is at significant risk for stroke/thromboembolism based upon her CHA2DS2-VASc Score of 3.  Continue Apixaban (Eliquis).   Due to finding of aortic arch atherosclerosis, I will incorporate this as vascular disease towards her risk of stroke. Continue anticoagulation without interruption.  ICA aneursym I will have patient establish with general cardiology since she has HTN, paroxysmal Afib, and atherosclerosis of bilateral carotid arteries and aortic arch. I am not sure if patient requires vascular referral as well or if just yearly surveillance of 6x3 mm aneurysm.   Sleep apnea Refer for sleep study, she would like to find  out more about Inspire device.    Follow up as scheduled with [name].    [name], PA-C  Afib Clinic [facility] [address] [place], [place] [address] [phone]

## 2022-09-29 ENCOUNTER — Encounter (HOSPITAL_COMMUNITY): Payer: Self-pay | Admitting: *Deleted

## 2022-10-26 ENCOUNTER — Ambulatory Visit: Payer: BC Managed Care – PPO | Admitting: Student

## 2022-11-10 ENCOUNTER — Ambulatory Visit: Payer: BC Managed Care – PPO | Admitting: Skilled Nursing Facility1

## 2022-11-12 ENCOUNTER — Other Ambulatory Visit: Payer: Self-pay | Admitting: Nurse Practitioner

## 2022-11-12 DIAGNOSIS — I480 Paroxysmal atrial fibrillation: Secondary | ICD-10-CM

## 2022-11-23 ENCOUNTER — Emergency Department (HOSPITAL_BASED_OUTPATIENT_CLINIC_OR_DEPARTMENT_OTHER): Payer: BC Managed Care – PPO

## 2022-11-23 ENCOUNTER — Observation Stay (HOSPITAL_BASED_OUTPATIENT_CLINIC_OR_DEPARTMENT_OTHER)
Admission: EM | Admit: 2022-11-23 | Discharge: 2022-11-24 | Disposition: A | Discharge status: Home / Self Care | Payer: BC Managed Care – PPO | Attending: Emergency Medicine | Admitting: Emergency Medicine

## 2022-11-23 ENCOUNTER — Other Ambulatory Visit: Payer: Self-pay

## 2022-11-23 ENCOUNTER — Encounter (HOSPITAL_BASED_OUTPATIENT_CLINIC_OR_DEPARTMENT_OTHER): Payer: Self-pay | Admitting: Emergency Medicine

## 2022-11-23 ENCOUNTER — Telehealth: Payer: Self-pay | Admitting: Nurse Practitioner

## 2022-11-23 DIAGNOSIS — I4891 Unspecified atrial fibrillation: Secondary | ICD-10-CM | POA: Diagnosis not present

## 2022-11-23 DIAGNOSIS — I480 Paroxysmal atrial fibrillation: Principal | ICD-10-CM

## 2022-11-23 DIAGNOSIS — G4733 Obstructive sleep apnea (adult) (pediatric): Secondary | ICD-10-CM | POA: Diagnosis not present

## 2022-11-23 DIAGNOSIS — Z79899 Other long term (current) drug therapy: Secondary | ICD-10-CM | POA: Diagnosis not present

## 2022-11-23 DIAGNOSIS — Z888 Allergy status to other drugs, medicaments and biological substances status: Secondary | ICD-10-CM | POA: Diagnosis not present

## 2022-11-23 DIAGNOSIS — Z9049 Acquired absence of other specified parts of digestive tract: Secondary | ICD-10-CM | POA: Diagnosis not present

## 2022-11-23 DIAGNOSIS — Z7901 Long term (current) use of anticoagulants: Secondary | ICD-10-CM | POA: Diagnosis not present

## 2022-11-23 DIAGNOSIS — R002 Palpitations: Secondary | ICD-10-CM | POA: Diagnosis not present

## 2022-11-23 DIAGNOSIS — Z87891 Personal history of nicotine dependence: Secondary | ICD-10-CM | POA: Insufficient documentation

## 2022-11-23 DIAGNOSIS — Z9851 Tubal ligation status: Secondary | ICD-10-CM | POA: Diagnosis not present

## 2022-11-23 DIAGNOSIS — I10 Essential (primary) hypertension: Secondary | ICD-10-CM | POA: Insufficient documentation

## 2022-11-23 DIAGNOSIS — E860 Dehydration: Secondary | ICD-10-CM | POA: Diagnosis present

## 2022-11-23 DIAGNOSIS — K219 Gastro-esophageal reflux disease without esophagitis: Secondary | ICD-10-CM | POA: Diagnosis not present

## 2022-11-23 DIAGNOSIS — I493 Ventricular premature depolarization: Secondary | ICD-10-CM | POA: Diagnosis present

## 2022-11-23 DIAGNOSIS — D6869 Other thrombophilia: Secondary | ICD-10-CM | POA: Diagnosis not present

## 2022-11-23 LAB — CBC WITH DIFFERENTIAL/PLATELET
Abs Immature Granulocytes: 0.01 10*3/uL (ref 0.00–0.07)
Basophils Absolute: 0 10*3/uL (ref 0.0–0.1)
Basophils Relative: 0 %
Eosinophils Absolute: 0.1 10*3/uL (ref 0.0–0.5)
Eosinophils Relative: 2 %
HCT: 40.8 % (ref 36.0–46.0)
Hemoglobin: 13.6 g/dL (ref 12.0–15.0)
Immature Granulocytes: 0 %
Lymphocytes Relative: 50 %
Lymphs Abs: 3.3 10*3/uL (ref 0.7–4.0)
MCH: 28.4 pg (ref 26.0–34.0)
MCHC: 33.3 g/dL (ref 30.0–36.0)
MCV: 85.2 fL (ref 80.0–100.0)
Monocytes Absolute: 0.2 10*3/uL (ref 0.1–1.0)
Monocytes Relative: 3 %
Neutro Abs: 3 10*3/uL (ref 1.7–7.7)
Neutrophils Relative %: 45 %
Platelets: 434 10*3/uL — ABNORMAL HIGH (ref 150–400)
RBC: 4.79 MIL/uL (ref 3.87–5.11)
RDW: 14.3 % (ref 11.5–15.5)
WBC: 6.8 10*3/uL (ref 4.0–10.5)
nRBC: 0 % (ref 0.0–0.2)

## 2022-11-23 LAB — COMPREHENSIVE METABOLIC PANEL
ALT: 17 U/L (ref 0–44)
AST: 22 U/L (ref 15–41)
Albumin: 4.3 g/dL (ref 3.5–5.0)
Alkaline Phosphatase: 80 U/L (ref 38–126)
Anion gap: 12 (ref 5–15)
BUN: 11 mg/dL (ref 6–20)
CO2: 25 mmol/L (ref 22–32)
Calcium: 9.6 mg/dL (ref 8.9–10.3)
Chloride: 100 mmol/L (ref 98–111)
Creatinine, Ser: 0.72 mg/dL (ref 0.44–1.00)
GFR, Estimated: >60 mL/min (ref >60)
Glucose, Bld: 195 mg/dL — ABNORMAL HIGH (ref 70–99)
Potassium: 3.7 mmol/L (ref 3.5–5.1)
Sodium: 137 mmol/L (ref 135–145)
Total Bilirubin: 0.7 mg/dL (ref 0.3–1.2)
Total Protein: 7.9 g/dL (ref 6.5–8.1)

## 2022-11-23 LAB — TROPONIN I (HIGH SENSITIVITY)
Troponin I (High Sensitivity): 5 ng/L (ref <18)
Troponin I (High Sensitivity): 3 ng/L (ref <18)

## 2022-11-23 LAB — TSH: TSH: 0.571 u[IU]/mL (ref 0.350–4.500)

## 2022-11-23 MED ORDER — ACETAMINOPHEN 325 MG PO TABS: 650.0000 mg | ORAL_TABLET | ORAL | Status: DC | PRN

## 2022-11-23 MED ORDER — LOSARTAN POTASSIUM 25 MG PO TABS
25.0000 mg | ORAL_TABLET | Freq: Every morning | ORAL | Status: DC
  Administered 2022-11-24: 25 mg via ORAL
  Filled 2022-11-23: qty 1

## 2022-11-23 MED ORDER — APIXABAN 5 MG PO TABS
5.0000 mg | ORAL_TABLET | Freq: Two times a day (BID) | ORAL | Status: DC
  Administered 2022-11-23 – 2022-11-24 (×2): 5 mg via ORAL
  Filled 2022-11-23 (×2): qty 1

## 2022-11-23 MED ORDER — DILTIAZEM HCL ER COATED BEADS 240 MG PO CP24
240.0000 mg | ORAL_CAPSULE | Freq: Every day | ORAL | Status: DC
  Administered 2022-11-24: 240 mg via ORAL
  Filled 2022-11-23: qty 1

## 2022-11-23 MED ORDER — ONDANSETRON HCL 4 MG/2ML IJ SOLN: 4.0000 mg | Freq: Four times a day (QID) | INTRAMUSCULAR | Status: DC | PRN

## 2022-11-23 MED ORDER — SERTRALINE HCL 50 MG PO TABS
50.0000 mg | ORAL_TABLET | Freq: Every day | ORAL | Status: DC
  Administered 2022-11-24: 50 mg via ORAL
  Filled 2022-11-23: qty 1

## 2022-11-23 MED ORDER — SPIRONOLACTONE 25 MG PO TABS
25.0000 mg | ORAL_TABLET | Freq: Every day | ORAL | Status: DC
  Administered 2022-11-24: 25 mg via ORAL
  Filled 2022-11-23: qty 1

## 2022-11-23 MED ORDER — FAMOTIDINE 20 MG PO TABS: 20.0000 mg | ORAL_TABLET | ORAL | Status: DC | PRN

## 2022-11-23 MED ORDER — METOPROLOL SUCCINATE ER 25 MG PO TB24
25.0000 mg | ORAL_TABLET | Freq: Every day | ORAL | Status: DC
  Administered 2022-11-24: 25 mg via ORAL
  Filled 2022-11-23: qty 1

## 2022-11-23 NOTE — H&P (Signed)
Cardiology Admission History and Physical   Patient ID: Emma Stephens MRN: [id number]; DOB: 1970/09/12   Admission date: 11/23/2022  PCP:  Anne Ng, NP   [facility] Providers Cardiologist:  None  Electrophysiologist:  Sherryl Manges, MD  Sleep Medicine:  Armanda Magic, MD       Chief Complaint:  palpitations  Patient Profile:   Emma Stephens is a 52 y.o. female with h/o AFib who is being seen 11/23/2022 for the evaluation of palpitations.  History of Present Illness:   Emma Stephens with a history of HTN, PVCs, OSA, and paroxysmal atrial fibrillation.  The patient was initially diagnosed with atrial fibrillation in April 2020. Seen in [facility] ED 09/08/22 found to be in Afib with RVR; symptoms of chest pressure and palpitations. Transferred to [facility] for neurologic symptoms with negative head CT. Treated with Toprol and diltiazem. Patient is on Eliquis 5 mg BID for a CHADS2VASC score of 2.   Today, she reports: "States felt like she was in A fib since last night about MN. states knows she is dehydrated. some sob and pressure in chest ."  She had some loose stool this morning which she thinks caused her dehydration.  She was not able to keep up with drinking more water.  Transferred to [facility] for cardioversion tomorrow if still in AFib.  Past Medical History:  Diagnosis Date   Acute gout 07/30/2014   Anemia    Anxiety    Blood transfusion without reported diagnosis    Cigarette nicotine dependence    GERD (gastroesophageal reflux disease)    Gout 2011   Helicobacter pylori gastritis 02/19/2022   Hypertension    Malignant hyperthermia    PAF (paroxysmal atrial fibrillation) (HCC)    PVC (premature ventricular contraction)    Sleep apnea    not on cpap at this time 01-17-20    Past Surgical History:  Procedure Laterality Date   BIOPSY  02/21/2022   Procedure: BIOPSY;  Surgeon: Jeani Hawking, MD;  Location: [facility];  Service:  Gastroenterology;;   CHOLECYSTECTOMY N/A 02/23/2022   Procedure: LAPAROSCOPIC CHOLECYSTECTOMY with Lysis of Adhessions;  Surgeon: Quentin Ore, MD;  Location: [facility];  Service: General;  Laterality: N/A;   ESOPHAGOGASTRODUODENOSCOPY (EGD) WITH PROPOFOL N/A 02/21/2022   Procedure: ESOPHAGOGASTRODUODENOSCOPY (EGD) WITH PROPOFOL;  Surgeon: Jeani Hawking, MD;  Location: [facility];  Service: Gastroenterology;  Laterality: N/A;   NECK SURGERY     TUBAL LIGATION       Medications Prior to Admission: Prior to Admission medications   Medication Sig Start Date End Date Taking? Authorizing Provider  apixaban (ELIQUIS) 5 MG TABS tablet Take 1 tablet (5 mg total) by mouth 2 (two) times daily. 09/10/22   Nche, Bonna Gains, NP  Cholecalciferol (VITAMIN D-3 PO) Take 1,000 Units by mouth daily.    [provider]  cyanocobalamin (VITAMIN B12) 1000 MCG tablet Take 1 tablet (1,000 mcg total) by mouth daily. 02/24/22   Lorin Glass, MD  diltiazem (CARDIZEM CD) 240 MG 24 hr capsule TAKE 1 CAPSULE BY MOUTH EVERY DAY 11/12/22   Nche, Bonna Gains, NP  famotidine (PEPCID) 20 MG tablet Take 20 mg by mouth as needed for heartburn or indigestion.    [provider]  losartan (COZAAR) 25 MG tablet Take 1 tablet (25 mg total) by mouth in the morning. 12/25/21   Nche, Bonna Gains, NP  metoprolol succinate (TOPROL-XL) 25 MG 24 hr tablet TAKE 1 TABLET (25 MG TOTAL) BY  MOUTH DAILY. 07/26/22   Nche, Bonna Gains, NP  sertraline (ZOLOFT) 50 MG tablet Take 1 tablet (50 mg total) by mouth daily. 12/25/21   Nche, Bonna Gains, NP  spironolactone (ALDACTONE) 25 MG tablet TAKE 1 TABLET (25 MG TOTAL) BY MOUTH DAILY. 08/05/22   Nche, Bonna Gains, NP     Allergies:    Allergies  Allergen Reactions   Lisinopril Hives, Swelling and Other (See Comments)    Angioedema and facial swelling   Bupropion Itching and Other (See Comments)    Social History:   Social History   Socioeconomic History    Marital status: Married    Spouse name: Not on file   Number of children: Not on file   Years of education: Not on file   Highest education level: Some college, no degree  Occupational History   Occupation: Magazine features editor: [employer]  Tobacco Use   Smoking status: Former    Current packs/day: 0.00    Average packs/day: 0.5 packs/day for 31.0 years (15.5 ttl pk-yrs)    Types: Cigarettes    Start date: 11/03/1990    Quit date: 11/02/2021    Years since quitting: 1.0   Smokeless tobacco: Never  Vaping Use   Vaping status: Some Days  Substance and Sexual Activity   Alcohol use: No   Drug use: No   Sexual activity: Yes    Partners: Male    Birth control/protection: Post-menopausal  Other Topics Concern   Not on file  Social History Narrative   Lives in [place] with family.   Social Determinants of Health   Financial Resource Strain: Low Risk  (08/31/2022)   Overall Financial Resource Strain (CARDIA)    Difficulty of Paying Living Expenses: Not hard at all  Food Insecurity: No Food Insecurity (08/31/2022)   Hunger Vital Sign    Worried About Running Out of Food in the Last Year: Never true    Ran Out of Food in the Last Year: Never true  Transportation Needs: No Transportation Needs (08/31/2022)   PRAPARE - Administrator, Civil Service (Medical): No    Lack of Transportation (Non-Medical): No  Physical Activity: Unknown (08/31/2022)   Exercise Vital Sign    Days of Exercise per Week: 0 days    Minutes of Exercise per Session: Not on file  Stress: Patient Declined (08/31/2022)   [employer] of Occupational Health - Occupational Stress Questionnaire    Feeling of Stress : Patient declined  Social Connections: Unknown (08/31/2022)   Social Connection and Isolation Panel [NHANES]    Frequency of Communication with Friends and Family: More than three times a week    Frequency of Social Gatherings with Friends and Family: Once a week    Attends Religious  Services: More than 4 times per year    Active Member of [employer] or Organizations: Not on file    Attends Banker Meetings: Not on file    Marital Status: Married  Catering manager Violence: Not on file    Family History:   The patient's family history includes Breast cancer in her paternal grandmother; Cancer (age of onset: 44) in her father; Diabetes in her brother, mother, and sister; Hypertension in her brother, mother, and sister. There is no history of Sudden Cardiac Death, Heart attack, Colon cancer, Esophageal cancer, Rectal cancer, or Stomach cancer.    ROS:  Please see the history of present illness.  All other ROS reviewed and negative.  Physical Exam/Data:   Vitals:   11/23/22 1500 11/23/22 1600 11/23/22 1630 11/23/22 1755  BP: 138/80 (!) 141/88 120/64 121/70  Pulse: 64 71 88 87  Resp: 14 18 19    Temp:    (!) 97.5 F (36.4 C)  TempSrc:    Oral  SpO2: 99% 98% 98% 97%  Weight:      Height:       No intake or output data in the 24 hours ending 11/23/22 1757    11/23/2022   12:00 PM 09/13/2022    9:03 AM 09/10/2022   10:03 AM  Last 3 Weights  Weight (lbs) 165 lb 165 lb 3.2 oz 165 lb  Weight (kg) 74.844 kg 74.934 kg 74.844 kg     Body mass index is 31.18 kg/m.  General:  Well nourished, well developed, in no acute distress HEENT: normal Neck: no JVD Vascular: No carotid bruits; Distal pulses 2+ bilaterally   Cardiac:  normal S1, S2; irregularly irregular; no murmur  Lungs:  clear to auscultation bilaterally, no wheezing, rhonchi or rales  Abd: soft, nontender, no hepatomegaly  Ext: no edema Musculoskeletal:  No deformities, BUE and BLE strength normal and equal Skin: warm and dry  Neuro:  CNs 2-12 intact, no focal abnormalities noted Psych:  Normal affect    EKG:  The ECG that was done today was personally reviewed and demonstrates atrial fibrillation with rapid ventricular response  Relevant CV Studies: LVEF in 2020 showed normal LV  function.  Laboratory Data:  High Sensitivity Troponin:   Recent Labs  Lab 11/23/22 1246 11/23/22 1438  TROPONINIHS 5 3      Chemistry Recent Labs  Lab 11/23/22 1207  NA 137  K 3.7  CL 100  CO2 25  GLUCOSE 195*  BUN 11  CREATININE 0.72  CALCIUM 9.6  GFRNONAA >60  ANIONGAP 12    Recent Labs  Lab 11/23/22 1207  PROT 7.9  ALBUMIN 4.3  AST 22  ALT 17  ALKPHOS 80  BILITOT 0.7   Lipids No results for input(s): "CHOL", "TRIG", "HDL", "LABVLDL", "LDLCALC", "CHOLHDL" in the last 168 hours. Hematology Recent Labs  Lab 11/23/22 1207  WBC 6.8  RBC 4.79  HGB 13.6  HCT 40.8  MCV 85.2  MCH 28.4  MCHC 33.3  RDW 14.3  PLT 434*   Thyroid No results for input(s): "TSH", "FREET4" in the last 168 hours. BNPNo results for input(s): "BNP", "PROBNP" in the last 168 hours.  DDimer No results for input(s): "DDIMER" in the last 168 hours.   Radiology/Studies:  DG Chest Port 1 View  Result Date: 11/23/2022 CLINICAL DATA:  Atrial fibrillation EXAM: PORTABLE CHEST 1 VIEW COMPARISON:  X-ray 09/08/2022 FINDINGS: Hyperinflation. No consolidation, pneumothorax or effusion. No edema. Normal cardiopericardial silhouette. Overlapping cardiac leads. Fixation hardware seen along the lower cervical spine. Film is under penetrated. IMPRESSION: Hyperinflation.  No acute cardiopulmonary disease. Electronically Signed   By: Karen Kays M.D.   On: 11/23/2022 13:58     Assessment and Plan:   AFib: plan for cardioversion tomorrow.  Recheck echo since it has been since 2020.  Will give IV fluids, normal saline 125 cc/h x 4 hours.  Continue diltiazem, Toprol and Eliquis.  She does need to get her evening dose of Eliquis and tomorrow morning's dose of Eliquis prior to cardioversion.  She has not missed any doses of Eliquis in the past month.  She does have some mild discomfort.  Will prescribe low-dose morphine.  Cardioversion described in detail.  All questions answered.  Long-term, she should be  considered a candidate for ablation.  Would consider having EP see her tomorrow while she is in the hospital.  She is not going to be able to make her appointment on 9/5 with Otilio Saber as she is supposed to be leaving on vacation tomorrow. Risk Assessment/Risk Scores:         CHA2DS2-VASc Score = 3   This indicates a 3.2% annual risk of stroke. The patient's score is based upon: CHF History: 0 HTN History: 1 Diabetes History: 0 Stroke History: 0 Vascular Disease History: 1 Age Score: 0 Gender Score: 1     Code Status: Full Code  Severity of Illness: The appropriate patient status for this patient is OBSERVATION. Observation status is judged to be reasonable and necessary in order to provide the required intensity of service to ensure the patient's safety. The patient's presenting symptoms, physical exam findings, and initial radiographic and laboratory data in the context of their medical condition is felt to place them at decreased risk for further clinical deterioration. Furthermore, it is anticipated that the patient will be medically stable for discharge from the hospital within 2 midnights of admission.    For questions or updates, please contact [facility] Please consult [email or web address] for contact info under     Signed, Lance Muss, MD  11/23/2022 5:57 PM

## 2022-11-23 NOTE — ED Provider Notes (Signed)
[facility] Provider Note   CSN: [id number] Arrival date & time: 11/23/22  1142     History  Chief Complaint  Patient presents with   Palpitations    Emma Stephens is a 52 y.o. female with Paschal history of hypertension, PVCs, OSA, paroxysmal atrial fibrillation who presents to the emergency department for evaluation of palpitations.  Symptoms started tonight and has been consistent since.  Reports some chest pain and shortness of breath.  Patient has been taking metoprolol 25 mg and diltiazem 224 mg daily.  Today she took an extra dose of metoprolol 25 mg an hour before arrival with no relief.  Denies fever endorses nausea without vomiting.   Palpitations      Home Medications Prior to Admission medications   Medication Sig Start Date End Date Taking? Authorizing Provider  apixaban (ELIQUIS) 5 MG TABS tablet Take 1 tablet (5 mg total) by mouth 2 (two) times daily. 09/10/22   Nche, Bonna Gains, NP  Cholecalciferol (VITAMIN D-3 PO) Take 1,000 Units by mouth daily.    [provider]  cyanocobalamin (VITAMIN B12) 1000 MCG tablet Take 1 tablet (1,000 mcg total) by mouth daily. 02/24/22   Lorin Glass, MD  diltiazem (CARDIZEM CD) 240 MG 24 hr capsule TAKE 1 CAPSULE BY MOUTH EVERY DAY 11/12/22   Nche, Bonna Gains, NP  famotidine (PEPCID) 20 MG tablet Take 20 mg by mouth as needed for heartburn or indigestion.    [provider]  losartan (COZAAR) 25 MG tablet Take 1 tablet (25 mg total) by mouth in the morning. 12/25/21   Nche, Bonna Gains, NP  metoprolol succinate (TOPROL-XL) 25 MG 24 hr tablet TAKE 1 TABLET (25 MG TOTAL) BY MOUTH DAILY. 07/26/22   Nche, Bonna Gains, NP  sertraline (ZOLOFT) 50 MG tablet Take 1 tablet (50 mg total) by mouth daily. 12/25/21   Nche, Bonna Gains, NP  spironolactone (ALDACTONE) 25 MG tablet TAKE 1 TABLET (25 MG TOTAL) BY MOUTH DAILY. 08/05/22   Nche, Bonna Gains, NP      Allergies     Lisinopril and Bupropion    Review of Systems   Review of Systems  Cardiovascular:  Positive for palpitations.    Physical Exam Updated Vital Signs BP (!) 135/98 (BP Location: Left Arm)   Pulse 85   Temp 98.1 F (36.7 C) (Oral)   Resp 20   Ht 5\' 1"  (1.549 m)   Wt 74.8 kg   SpO2 100%   BMI 31.18 kg/m  Physical Exam  ED Results / Procedures / Treatments   Labs (all labs ordered are listed, but only abnormal results are displayed) Labs Reviewed  CBC WITH DIFFERENTIAL/PLATELET - Abnormal; Notable for the following components:      Result Value   Platelets 434 (*)    All other components within normal limits  PREGNANCY, URINE  COMPREHENSIVE METABOLIC PANEL  TROPONIN I (HIGH SENSITIVITY)    EKG None  Radiology No results found.  Procedures Procedures    Medications Ordered in ED Medications - No data to display  ED Course/ Medical Decision Making/ A&P                                 Medical Decision Making Amount and/or Complexity of Data Reviewed Labs: ordered. Radiology: ordered.  Risk Decision regarding hospitalization.   This patient presents to the ED for palpitation, this involves an extensive  number of treatment options, and is a complaint that carries with a high risk of complications and morbidity.  The differential diagnosis includes ACS/MI, A-fib, A-fib RVR, PVC, other arrhythmia..  This is not an exhaustive list.  Lab tests: I ordered and personally interpreted labs.  The pertinent results include: WBC unremarkable. Hbg unremarkable. Platelets unremarkable. Electrolytes unremarkable. BUN, creatinine unremarkable.  Troponins negative.  Imaging studies: I ordered imaging studies, personally reviewed, interpreted imaging and agree with the radiologist's interpretations. The results include: Negative chest x-ray  Problem list/ ED course/ Critical interventions/ Medical management: HPI: See above Vital signs within normal range and stable  throughout visit. Laboratory/imaging studies significant for: See above. On physical examination, patient is afebrile and appears uncomfortable and anxious.  Heart rate is fluctuating from 80-100.  EKG showed A-fib rhythms.  Patient takes metoprolol, diltiazem and Eliquis for A-fib.  States she took an extra dose of 25 mg of metoprolol this morning without noticing any improvement in her symptoms.  Will consult cardiology.  Patient is hemodynamically stable at this point.  Heart rate is 80.  Blood pressure is 131/71.  Continues to have A-fib rhythm on the monitor. I have reviewed the patient home medicines and have made adjustments as needed.  Cardiac monitoring/EKG: The patient was maintained on a cardiac monitor.  I personally reviewed and interpreted the cardiac monitor which showed an underlying rhythm of: A-fib.  Additional history obtained: External records from outside source obtained and reviewed including: Chart review including previous notes, labs, imaging.  Consultations obtained: I spoke to Dr. Wyline Mood radiology.  She agreed to admit the patient to cardiology service, potentially cardioversion tomorrow.  Disposition Admit to cardiology.  This chart was dictated using voice recognition software.  Despite best efforts to proofread,  errors can occur which can change the documentation meaning.          Final Clinical Impression(s) / ED Diagnoses Final diagnoses:  Paroxysmal atrial fibrillation [facility])    Rx / DC Orders ED Discharge Orders          Ordered    diltiazem (CARDIZEM) 30 MG tablet  Once PRN        11/24/22 0841    Amb referral to AFIB Clinic        11/23/22 1846              Jeanelle Malling, PA 11/24/22 0945    Rolan Bucco, MD 12/01/22 1621

## 2022-11-23 NOTE — ED Triage Notes (Signed)
States felt like she was in A fib since last night about [place] states knows she is dehydrated  some sob and pressure in chest she states

## 2022-11-23 NOTE — Telephone Encounter (Signed)
Caller Name: [name] Ph #: [phone]  Chief Complaint: heart fluttering, cannot see cardiology until 9/5, history of Afib   This call was transferred to [facility] with Nurse Triage/Access Nurse. This is for documentation purposes. No follow up required at this time.

## 2022-11-24 ENCOUNTER — Other Ambulatory Visit: Payer: Self-pay | Admitting: Physician Assistant

## 2022-11-24 DIAGNOSIS — I10 Essential (primary) hypertension: Secondary | ICD-10-CM | POA: Diagnosis not present

## 2022-11-24 DIAGNOSIS — I480 Paroxysmal atrial fibrillation: Secondary | ICD-10-CM | POA: Diagnosis not present

## 2022-11-24 DIAGNOSIS — D6869 Other thrombophilia: Secondary | ICD-10-CM | POA: Diagnosis not present

## 2022-11-24 MED ORDER — SODIUM CHLORIDE 0.9 % IV BOLUS
250.0000 mL | Freq: Once | INTRAVENOUS | Status: AC
  Administered 2022-11-24: 250 mL via INTRAVENOUS

## 2022-11-24 MED ORDER — DILTIAZEM HCL 30 MG PO TABS: 30.0000 mg | ORAL_TABLET | Freq: Once | ORAL | 11 refills | Status: DC | PRN

## 2022-11-24 MED ORDER — SODIUM CHLORIDE 0.9 % IV BOLUS: 500.0000 mL | Freq: Once | INTRAVENOUS | Status: DC

## 2022-11-24 MED ORDER — POTASSIUM CHLORIDE CRYS ER 20 MEQ PO TBCR
40.0000 meq | EXTENDED_RELEASE_TABLET | Freq: Once | ORAL | Status: AC
  Administered 2022-11-24: 40 meq via ORAL
  Filled 2022-11-24: qty 2

## 2022-11-24 NOTE — Discharge Instructions (Signed)
Please excuse patient from work for 9/3 and 9/4 due to hospitalization.

## 2022-11-24 NOTE — Progress Notes (Signed)
Echo ordered for Afib prior to EP evaluation

## 2022-11-24 NOTE — Discharge Summary (Addendum)
Discharge Summary    Patient ID: Emma Stephens MRN: [id number]; DOB: Aug 07, 1970  Admit date: 11/23/2022 Discharge date: 11/24/2022  PCP:  Anne Ng, NP   [facility] Providers Cardiologist:  None  Electrophysiologist:  Sherryl Manges, MD  Sleep Medicine:  Armanda Magic, MD       Discharge Diagnoses    Principal Problem:   Atrial fibrillation [facility]) Active Problems:   Essential hypertension, benign   Symptomatic PVCs   OSA (obstructive sleep apnea)   Hypercoagulable state due to paroxysmal atrial fibrillation [facility])   Diagnostic Studies/Procedures    CT coronary 02/2021: IMPRESSION: 1. No evidence of CAD, CADRADS = 0.   2. Coronary calcium score of 0. This was 0 percentile for age and sex matched control.   3. Normal coronary origin with right dominance.   4.  Diffuse aortic atherosclerosis in descending aorta. _____________   History of Present Illness     Emma Stephens is a 52 y.o. female with with h/o AFib who is being seen 11/23/2022 for the evaluation of palpitations.   [name] with a history of HTN, PVCs, OSA, and paroxysmal atrial fibrillation.  The patient was initially diagnosed with atrial fibrillation in April 2020. Seen in [facility] ED 09/08/22 found to be in Afib with RVR; symptoms of chest pressure and palpitations. Transferred to [facility] for neurologic symptoms with negative head CT. Treated with Toprol and diltiazem. Patient is on Eliquis 5 mg BID for a CHADS2VASC score of 2.    Today, she reports: "States felt like she was in A fib since last night about MN. states knows she is dehydrated. some sob and pressure in chest ."  She had some loose stool this morning which she thinks caused her dehydration.  She was not able to keep up with drinking more water.   Transferred to [facility] for cardioversion tomorrow if still in AFib.    Hospital Course     Consultants: EP  Paroxysmal atrial fibrillation Presented in Afib with VR  105 Transferred to [facility] for DCCV, she reported 100% compliance with OAC. Fortunately, she spontaneously converted to sinus rhythm last evening ~6pm. Canceled DCCV. Will discharge on home medication with PRN short acting cardizem.   She is amenable to EP evaluation for possible ablation.   She is scheduled to leave on vacation tomorrow. Will provide PRN cardizem. I have scheduled an outpatient echo on 12/09/22 and she will see EP on 12/20/22 with Dr. Jimmey Ralph.   Chronic anticoagulation Continue eliquis   Hypertension Continue toprol, spironolactone, and losartan   Pt seen and examined by Dr. Eldridge Dace and deemed stable for discharge. Follow up has been arranged.     Did the patient have an acute coronary syndrome (MI, NSTEMI, STEMI, etc) this admission?:  No                               Did the patient have a percutaneous coronary intervention (stent / angioplasty)?:  No.        The patient will be scheduled for a TOC follow up appointment in 7-14 days.  A message has been sent to the [facility] and Scheduling Pool at the office where the patient should be seen for follow up.  _____________  Discharge Vitals Blood pressure 131/71, pulse 78, temperature 97.8 F (36.6 C), temperature source Oral, resp. rate 16, height 5\' 1"  (1.549 m), weight 76.7 kg, SpO2 99%.  Filed Weights   11/23/22 1200 11/24/22 0504  Weight: 74.8 kg 76.7 kg    Labs & Radiologic Studies    CBC Recent Labs    11/23/22 1207  WBC 6.8  NEUTROABS 3.0  HGB 13.6  HCT 40.8  MCV 85.2  PLT 434*   Basic Metabolic Panel Recent Labs    13/08/65 1207  NA 137  K 3.7  CL 100  CO2 25  GLUCOSE 195*  BUN 11  CREATININE 0.72  CALCIUM 9.6   Liver Function Tests Recent Labs    11/23/22 1207  AST 22  ALT 17  ALKPHOS 80  BILITOT 0.7  PROT 7.9  ALBUMIN 4.3   No results for input(s): "LIPASE", "AMYLASE" in the last 72 hours. High Sensitivity Troponin:   Recent Labs  Lab 11/23/22 1246 11/23/22 1438   TROPONINIHS 5 3    BNP Invalid input(s): "POCBNP" D-Dimer No results for input(s): "DDIMER" in the last 72 hours. Hemoglobin A1C No results for input(s): "HGBA1C" in the last 72 hours. Fasting Lipid Panel No results for input(s): "CHOL", "HDL", "LDLCALC", "TRIG", "CHOLHDL", "LDLDIRECT" in the last 72 hours. Thyroid Function Tests Recent Labs    11/23/22 2044  TSH 0.571   _____________  DG Chest Port 1 View  Result Date: 11/23/2022 CLINICAL DATA:  Atrial fibrillation EXAM: PORTABLE CHEST 1 VIEW COMPARISON:  X-ray 09/08/2022 FINDINGS: Hyperinflation. No consolidation, pneumothorax or effusion. No edema. Normal cardiopericardial silhouette. Overlapping cardiac leads. Fixation hardware seen along the lower cervical spine. Film is under penetrated. IMPRESSION: Hyperinflation.  No acute cardiopulmonary disease. Electronically Signed   By: Karen Kays M.D.   On: 11/23/2022 13:58   Disposition   Pt is being discharged home today in good condition.  Follow-up Plans & Appointments     Follow-up Information     Nobie Putnam, MD Follow up.   Specialty: Cardiology Why: on 9/30 at 1100 am to discuss options for treating your atrial fibrillation Contact information: [address] [place] [address] [phone]                Discharge Instructions     Amb referral to AFIB Clinic   Complete by: As directed         Discharge Medications   Allergies as of 11/24/2022       Reactions   Lisinopril Hives, Swelling, Other (See Comments)   Angioedema and facial swelling   Bupropion Itching, Other (See Comments)        Medication List     TAKE these medications    apixaban 5 MG Tabs tablet Commonly known as: ELIQUIS Take 1 tablet (5 mg total) by mouth 2 (two) times daily.   cyanocobalamin 1000 MCG tablet Commonly known as: VITAMIN B12 Take 1 tablet (1,000 mcg total) by mouth daily.   diltiazem 240 MG 24 hr capsule Commonly known as: CARDIZEM  CD TAKE 1 CAPSULE BY MOUTH EVERY DAY   diltiazem 30 MG tablet Commonly known as: Cardizem Take 1 tablet (30 mg total) by mouth once as needed for up to 1 dose (for Afib).   famotidine 20 MG tablet Commonly known as: PEPCID Take 20 mg by mouth as needed for heartburn or indigestion.   losartan 25 MG tablet Commonly known as: COZAAR Take 1 tablet (25 mg total) by mouth in the morning.   metoprolol succinate 25 MG 24 hr tablet Commonly known as: TOPROL-XL TAKE 1 TABLET (25 MG TOTAL) BY MOUTH DAILY.   sertraline 50  MG tablet Commonly known as: ZOLOFT Take 1 tablet (50 mg total) by mouth daily.   spironolactone 25 MG tablet Commonly known as: ALDACTONE TAKE 1 TABLET (25 MG TOTAL) BY MOUTH DAILY.   VITAMIN D-3 PO Take 1,000 Units by mouth daily.           Outstanding Labs/Studies   EP follow up to consider ablation  Duration of Discharge Encounter   Greater than 30 minutes including physician time.  Signed, Roe Rutherford Duke, PA 11/24/2022, 8:54 AM  I have examined the patient and reviewed assessment and plan and discussed with patient.  Agree with above as stated.    Patient spontaneously converted to normal sinus rhythm last night.  She could immediately feel the difference.    GEN: Well nourished, well developed, in no acute distress  HEENT: normal  Neck: no JVD, carotid bruits, or masses Cardiac: RRR; no murmurs, rubs, or gallops,no edema  Respiratory:  clear to auscultation bilaterally, normal work of breathing GI: soft, nontender, nondistended,  MS: no deformity or atrophy  Skin: warm and dry, no rash Neuro:  Strength and sensation are intact Psych: euthymic mood, full affect  No need for cardioversion.  Continue Eliquis.  Continue metoprolol and diltiazem.  She will follow-up with Dr. Jimmey Ralph at the end of September to discuss options for management of her atrial fibrillation.  Will obtain echocardiogram prior to that appointment.  She felt dehydrated but  did not receive her IV fluids last night.  Will give her a 250 cc normal saline bolus this morning.  She should be okay for discharge later today.  Lance Muss

## 2022-11-25 ENCOUNTER — Ambulatory Visit: Payer: BC Managed Care – PPO | Admitting: Student

## 2022-11-26 ENCOUNTER — Telehealth: Payer: Self-pay

## 2022-11-26 NOTE — Transitions of Care (Post Inpatient/ED Visit) (Signed)
   11/26/2022  Name: Emma Stephens MRN: [id number] DOB: [birth year]  Today's TOC FU Call Status: Today's TOC FU Call Status:: Unsuccessful Call (1st Attempt) Unsuccessful Call (1st Attempt) Date: 11/26/22  Attempted to reach the patient regarding the most recent Inpatient/ED visit.  Follow Up Plan: Additional outreach attempts will be made to reach the patient to complete the Transitions of Care (Post Inpatient/ED visit) call.   Jodelle Gross RN, BSN, CCM [facility] RN Care Coordinator/ Transitions of Care Direct Dial: [phone]  Fax: [phone]

## 2022-11-29 ENCOUNTER — Telehealth: Payer: Self-pay

## 2022-11-29 NOTE — Transitions of Care (Post Inpatient/ED Visit) (Signed)
   11/29/2022  Name: Emma Stephens MRN: [id number] DOB: [birth year]  Today's TOC FU Call Status: Today's TOC FU Call Status:: Unsuccessful Call (2nd Attempt) Unsuccessful Call (2nd Attempt) Date: 11/29/22  Attempted to reach the patient regarding the most recent Inpatient/ED visit.  Follow Up Plan: Additional outreach attempts will be made to reach the patient to complete the Transitions of Care (Post Inpatient/ED visit) call.   Jodelle Gross RN, BSN, CCM [facility] RN Care Coordinator/ Transitions of Care Direct Dial: [phone]  Fax: [phone]

## 2022-11-30 ENCOUNTER — Telehealth: Payer: Self-pay

## 2022-11-30 NOTE — Transitions of Care (Post Inpatient/ED Visit) (Signed)
   11/30/2022  Name: Emma Stephens MRN: [id number] DOB: [birth year]  Today's TOC FU Call Status: Today's TOC FU Call Status:: Unsuccessful Call (3rd Attempt) Unsuccessful Call (3rd Attempt) Date: 11/30/22  Attempted to reach the patient regarding the most recent Inpatient/ED visit.  Follow Up Plan: No further outreach attempts will be made at this time. We have been unable to contact the patient.  Jodelle Gross RN, BSN, CCM [facility] RN Care Coordinator/ Transitions of Care

## 2022-12-09 ENCOUNTER — Ambulatory Visit (HOSPITAL_COMMUNITY): Payer: BC Managed Care – PPO | Attending: Internal Medicine

## 2022-12-09 DIAGNOSIS — I480 Paroxysmal atrial fibrillation: Secondary | ICD-10-CM | POA: Insufficient documentation

## 2022-12-09 LAB — ECHOCARDIOGRAM COMPLETE
Area-P 1/2: 2.85 cm2
S' Lateral: 2.5 cm

## 2022-12-18 ENCOUNTER — Other Ambulatory Visit: Payer: Self-pay | Admitting: Nurse Practitioner

## 2022-12-19 NOTE — Progress Notes (Unsigned)
Electrophysiology Office Note:   Date:  12/20/2022  ID:  Emma Stephens, DOB 02/17/1971, MRN [id number]  Primary Cardiologist: None Electrophysiologist: Sherryl Manges, MD      History of Present Illness:   Emma Stephens is a 52 y.o. female with h/o HTN, PVCs, left internal carotid artery aneurysm and OSA who is seen today for  for Electrophysiology evaluation of atrial fibrillation at the request of Dr. Berton Mount.    Patient was first diagnosed with atrial fibrillation in April 2020.  She was recently seen in the [facility] in June 2024 and found to be in atrial fibrillation with RVR after presenting with symptoms of chest pressure and palpitations.  More recently, she was admitted to the hospital 9/3-9/4 for another episode of atrial fibrillation.  Cardiology team was planning for a cardioversion; however, the patient spontaneously converted to sinus rhythm overnight.  She was discharged home with a short acting Cardizem and continued on her Eliquis.  She continues to have episodes of atrial fibrillation, most recently in the past week occurring at night.  While in atrial fibrillation she endorses palpitations, fatigue, and exertional intolerance.  Review of systems complete and found to be negative unless listed in HPI.   EP Information / Studies Reviewed:    EKG is not ordered today. EKG from 11/23/22 reviewed which showed atrial fibrillation.     11/23/22 EKG:    12/09/2022 Echocardiogram: Normal LV size and function.  Of EF 60 to 65%.  Mild concentric LVH.  Grade 1 diastolic dysfunction. Normal RV size and function. No significant valvular disease mentioned. Normal left and right atrial size.  02/26/21 Coronary CTA: No evidence of CAD.  Normal origin with right dominance.  He is aortic atherosclerosis.  09/08/22 CTA Head and Neck:  IMPRESSION: 1. No emergent large vessel occlusion or high-grade stenosis of the intracranial arteries. 2. A 6 x 3 mm aneurysm  projecting anteriorly from the supraclinoid left ICA. 3. 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  Risk Assessment/Calculations:    CHA2DS2-VASc Score = 3   This indicates a 3.2% annual risk of stroke. The patient's score is based upon: CHF History: 0 HTN History: 1 Diabetes History: 0 Stroke History: 0 Vascular Disease History: 1 Age Score: 0 Gender Score: 1             Physical Exam:   VS:  BP 122/62 (BP Location: Right Arm, Patient Position: Sitting, Cuff Size: Large)   Pulse 76   Resp 16   Ht 5\' 1"  (1.549 m)   Wt 175 lb 6.4 oz (79.6 kg)   SpO2 98%   BMI 33.14 kg/m    Wt Readings from Last 3 Encounters:  12/20/22 175 lb 6.4 oz (79.6 kg)  11/24/22 169 lb (76.7 kg)  09/13/22 165 lb 3.2 oz (74.9 kg)     GEN: Well nourished, well developed in no acute distress CARDIAC: Normal rate and regular rhythm RESPIRATORY: Normal work of breathing ABDOMEN: Non-distended EXTREMITIES:  No edema; No deformity   ASSESSMENT AND PLAN:   Emma Stephens is a 52 y.o. female with h/o HTN, PVCs, left internal carotid artery aneurysm and OSA who is seen today for  for Electrophysiology evaluation of atrial fibrillation at the request of Dr. Berton Mount.   #.  Paroxysmal atrial fibrillation, symptomatic: -She is currently only on rate controlling medications, metoprolol and diltiazem.  Given that she has had multiple episodes requiring ED visits in  the past few months, I believe is reasonable to transition to a rhythm control strategy. We will start flecainide 100 mg twice daily and stop diltiazem.  She had a coronary CTA less than 2 years ago with a calcium score of 0 and normal coronary arteries.  Recent echocardiogram without wall motion abnormalities. -Discussed treatment options today for AF including antiarrhythmic drug therapy and ablation. Discussed risks, recovery and likelihood of success with each treatment  strategy. Risk, benefits, and alternatives to EP study and ablation for afib were discussed. These risks include but are not limited to stroke, bleeding, vascular damage, tamponade, perforation, damage to the esophagus, lungs, phrenic nerve and other structures, pulmonary vein stenosis, worsening renal function, coronary vasospasm and death.  Discussed potential need for repeat ablation procedures and antiarrhythmic drugs after an initial ablation. The patient understands these risk and wishes to proceed.  We will therefore proceed with catheter ablation at the next available time.  Carto, ICE, anesthesia are requested for the procedure.  Will also obtain CT PV protocol prior to the procedure to exclude LAA thrombus and further evaluate atrial anatomy.  We will tentatively plan to stop flecainide on the day of ablation.  #.  Secondary hypercoagulable state due to atrial fibrillation: -She has a CHA2DS2-VASc score of 3. -She is currently taking Eliquis with no issue.  She will continue on this medication.  #.  Hypertension:  -At goal today. Emphasized the importance of well-controlled blood pressure and reducing occurrences of atrial fibrillation. -Continue current regimen and close follow-up with primary care physician.  #.  Obstructive sleep apnea: -She is currently planning to be evaluated for the [facility] implant device.  Emphasized the importance of well-controlled sleep apnea and reducing atrial fibrillation burden.  #.  Left internal carotid artery aneurysm: -Identified on CTA head and neck dated 09/08/2022.  The patient reports that she has not seen a specialist in the outpatient setting for this.  She did see a neurologist while in the ED, but there is no comment about potential management of the aneurysm.  Given that she will need IV heparin during the case, I will message this neurologist to discuss if there is an increased risk of intracranial hemorrhage in the setting of the aneurysm.     Signed, Nobie Putnam, MD

## 2022-12-20 ENCOUNTER — Encounter: Payer: Self-pay | Admitting: Cardiology

## 2022-12-20 ENCOUNTER — Ambulatory Visit: Payer: BC Managed Care – PPO | Attending: Cardiology | Admitting: Cardiology

## 2022-12-20 VITALS — BP 122/62 | HR 76 | Resp 16 | Ht 61.0 in | Wt 175.4 lb

## 2022-12-20 DIAGNOSIS — D6869 Other thrombophilia: Secondary | ICD-10-CM | POA: Diagnosis not present

## 2022-12-20 DIAGNOSIS — I671 Cerebral aneurysm, nonruptured: Secondary | ICD-10-CM | POA: Diagnosis not present

## 2022-12-20 DIAGNOSIS — I480 Paroxysmal atrial fibrillation: Secondary | ICD-10-CM | POA: Diagnosis not present

## 2022-12-20 MED ORDER — FLECAINIDE ACETATE 100 MG PO TABS: 100.0000 mg | ORAL_TABLET | Freq: Two times a day (BID) | ORAL | 3 refills | Status: DC

## 2022-12-20 MED ORDER — DRONEDARONE HCL 400 MG PO TABS: 400.0000 mg | ORAL_TABLET | Freq: Two times a day (BID) | ORAL | 3 refills | Status: DC

## 2022-12-20 NOTE — Patient Instructions (Addendum)
Medication Instructions:  Your physician has recommended you make the following change in your medication:  1) START taking flecainide 100 mg twice daily  *If you need a refill on your cardiac medications before your next appointment, please call your pharmacy*  Lab Work: BMET and CBC  If you have labs (blood work) drawn today and your tests are completely normal, you will receive your results only by: MyChart Message (if you have MyChart) OR A paper copy in the mail If you have any lab test that is abnormal or we need to change your treatment, we will call you to review the results.  Testing/Procedures: Your physician has requested that you have cardiac CT. Cardiac computed tomography (CT) is a painless test that uses an x-ray machine to take clear, detailed pictures of your heart. For further information please visit [email or web address] Please follow instruction sheet as given.  Your physician has recommended that you have an ablation. Catheter ablation is a medical procedure used to treat some cardiac arrhythmias (irregular heartbeats). During catheter ablation, a long, thin, flexible tube is put into a blood vessel in your groin (upper thigh), or neck. This tube is called an ablation catheter. It is then guided to your heart through the blood vessel. Radio frequency waves destroy small areas of heart tissue where abnormal heartbeats may cause an arrhythmia to start. Please see the instruction sheet given to you today.  Follow-Up: At [facility], you and your health needs are our priority.  As part of our continuing mission to provide you with exceptional heart care, we have created designated Provider Care Teams.  These Care Teams include your primary Cardiologist (physician) and Advanced Practice Providers (APPs -  Physician Assistants and Nurse Practitioners) who all work together to provide you with the care you need, when you need it.  Your next appointment:  Nurse visit in  one week for an EKG

## 2022-12-20 NOTE — Telephone Encounter (Signed)
Refill request for  Famotidine 20 mg LR   HX provider LOV  09/10/22 FOV   none scheduled.   Please review and advise. Thanks. Dm/cma

## 2022-12-20 NOTE — Addendum Note (Signed)
Addended by: Frutoso Schatz on: 12/20/2022 04:44 PM   Modules accepted: Orders

## 2022-12-21 ENCOUNTER — Telehealth (HOSPITAL_COMMUNITY): Payer: Self-pay

## 2022-12-21 ENCOUNTER — Telehealth: Payer: Self-pay

## 2022-12-21 ENCOUNTER — Other Ambulatory Visit (HOSPITAL_COMMUNITY): Payer: Self-pay | Admitting: Neuroradiology

## 2022-12-21 DIAGNOSIS — I671 Cerebral aneurysm, nonruptured: Secondary | ICD-10-CM

## 2022-12-21 DIAGNOSIS — I480 Paroxysmal atrial fibrillation: Secondary | ICD-10-CM

## 2022-12-21 NOTE — Telephone Encounter (Signed)
Called to schedule consult w/Dr. Quay Burow, no answer, left vm. AB

## 2022-12-21 NOTE — Telephone Encounter (Signed)
Left message for patient to call back.  Her ablation needs to be cancelled and she needs to be set up for ETT in 7-10 days and a follow up in 3 months with Dr. Jimmey Ralph.

## 2022-12-21 NOTE — Telephone Encounter (Signed)
----- Message from Nobie Putnam sent at 12/21/2022 10:31 AM EDT ----- Regarding: FW: Left internal carotid artery aneurysm Can we call this patient and let her know that we have arranged for her to see a specialist for her intracranial aneurysm and that we will be postponing the procedure until a plan has been made? Since she will be on flecainide longer than expected let's do a ETT instead of routine EKG in 7-10 days. We can have her see me back in clinic in 3 months. That will hopefully give them enough time to sort things out.  Thanks, Josh ----- Message ----- From: Baldemar Lenis, MD Sent: 12/21/2022  10:29 AM EDT To: Shirlyn Goltz; Sharee Pimple; # Subject: RE: Left internal carotid artery aneurysm      Sounds good. We will try to get her in soon. ----- Message ----- From: Nobie Putnam, MD Sent: 12/20/2022   5:51 PM EDT To: Baldemar Lenis, MD; # Subject: RE: Left internal carotid artery aneurysm      Thanks so much for taking a look. I have no problem pushing ablation until after you perform stenting, assuming she is willing to proceed. Anti-coagulation could then be interrupted pre-procedure for you in the usual fashion, if needed. Then after stenting oral anti-coagulation and 1 antiplatelet would be ideal. Her atrial fibrillation can be medically managed in the interim.   Thanks again,  Michele Rockers ----- Message ----- From: Baldemar Lenis, MD Sent: 12/20/2022   5:18 PM EDT To: Shirlyn Goltz; Sharee Pimple; # Subject: RE: Left internal carotid artery aneurysm      Hello Dr. Jimmey Ralph,  This looks like a left ophthalmic segment ICA aneurysm. While these tend to be lower risk, given the size (6 mm) and apparent pointy configuration, I'd recommend treatment. It is hard to be definitive without  a catheter angiogram, but most of these aneurysms are nowadays treated with a flow diverter stent. This normally requires DAPT for  approximately 3-6 months. However, I have treated a number of these patients with DAPT perioperatively followed by anticoagulation + 1 antiplatelet instead successfully. I am happy to see the patient in the office for evaluation.  I'm copying our schedulers so that we can set up the appointment.  Thanks, Baldemar Lenis Interventional Neuroradiology ----- Message ----- From: Nobie Putnam, MD Sent: 12/20/2022   5:05 PM EDT To: Baldemar Lenis, MD; # Subject: FW: Left internal carotid artery aneurysm      Hi Dr. Nyra Jabs, I have attached a conversation that I had with Dr. Derry Lory to this email. He recommended that I reach out to you regarding a patient of mine with a newly diagnosed left internal carotid artery aneurysm. The patient has Afib and this was incidentally found during CTA for a TIA in June 2024. The patient is hoping to have an Afib ablation with me, which requires IV heparin and 3 months of uninterrupted oral anti-coagulation, but I want to make sure that she does not need imminent further workup, definitive treatment, or is too high risk for intracranial bleed. Nobody has formally seen the patient since the diagnosis of L ICA aneurysm was made, so I think she would benefit from a clinic visit. Just let me know your thoughts.  Thanks, Nobie Putnam, MD  Cardiac Electrophysiology [facility] ----- Message ----- From: Erick Blinks, MD Sent: 12/20/2022   3:51 PM EDT To: Frutoso Schatz, RN; Nobie Putnam, MD Subject: RE:  Left internal carotid artery aneurysm      Thank you for reaching out. She will need to follow up with Dr. Urban Gibson with Neuro Interventional Radiology. I don't suspect the aneurysm being a problem but would rather have her weigh in on this. ----- Message ----- From: Nobie Putnam, MD Sent: 12/20/2022   1:52 PM EDT To: Frutoso Schatz, RN; Erick Blinks, MD Subject: Left internal carotid  artery aneurysm          Hi Dr. Derry Lory, You saw this patient on September 08, 2022 in the ED for TIA symptoms in the setting of atrial fibrillation. You recommended CTA head and neck which resulted with an intracranial left internal carotid artery aneurysm. She came to see me in clinic today to discuss Afib ablation. I could not see any formal evaluation for her newly discovered intracranial aneurysm, but she thought you might have talked to her about the findings (didn't sound too sure). I do not want to schedule her for an ablation, which requires IV heparin and 3 months of uninterrupted oral anti-coagulation, if she needs imminent further workup, definitive treatment, or is high risk for intracranial bleed. Do you have recommendations, would you like to see her in clinic or should she be evaluated by Neurosurgery or NeuroIR instead? Just let me know your thoughts.  Thanks, Nobie Putnam, MD Cardiac Electrophysiology [facility]

## 2022-12-22 NOTE — Telephone Encounter (Signed)
Left message for patient to call back  

## 2022-12-23 NOTE — Telephone Encounter (Signed)
Spoke with the patient and advised that we need to cancel her ablation. We will have her follow up with interventional neurology. I have scheduled her for a 3 month follow up.  ETT has been ordered. She started flecainide on 10/2.

## 2022-12-23 NOTE — Addendum Note (Signed)
Addended by: Nobie Putnam D on: 12/23/2022 06:15 PM   Modules accepted: Orders

## 2022-12-23 NOTE — Addendum Note (Signed)
Addended by: Frutoso Schatz on: 12/23/2022 10:51 AM   Modules accepted: Orders

## 2022-12-27 ENCOUNTER — Telehealth (HOSPITAL_COMMUNITY): Payer: Self-pay

## 2022-12-27 NOTE — Telephone Encounter (Signed)
Detailed instructions left on the patient's answering machine. Asked to call back with any questions. S.Aby Gessel CCT

## 2022-12-28 ENCOUNTER — Other Ambulatory Visit: Payer: Self-pay | Admitting: *Deleted

## 2022-12-28 ENCOUNTER — Ambulatory Visit: Payer: BC Managed Care – PPO

## 2022-12-28 ENCOUNTER — Encounter: Payer: Self-pay | Admitting: Nurse Practitioner

## 2022-12-28 ENCOUNTER — Ambulatory Visit (INDEPENDENT_AMBULATORY_CARE_PROVIDER_SITE_OTHER): Payer: BC Managed Care – PPO | Admitting: Nurse Practitioner

## 2022-12-28 VITALS — BP 122/76 | HR 73 | Temp 97.9°F | Ht 61.0 in | Wt 176.4 lb

## 2022-12-28 DIAGNOSIS — D509 Iron deficiency anemia, unspecified: Secondary | ICD-10-CM

## 2022-12-28 DIAGNOSIS — I671 Cerebral aneurysm, nonruptured: Secondary | ICD-10-CM

## 2022-12-28 DIAGNOSIS — R7303 Prediabetes: Secondary | ICD-10-CM | POA: Diagnosis not present

## 2022-12-28 DIAGNOSIS — M2550 Pain in unspecified joint: Secondary | ICD-10-CM

## 2022-12-28 LAB — RENAL FUNCTION PANEL
Albumin: 4.3 g/dL (ref 3.5–5.2)
BUN: 9 mg/dL (ref 6–23)
CO2: 29 meq/L (ref 19–32)
Calcium: 9.5 mg/dL (ref 8.4–10.5)
Chloride: 102 meq/L (ref 96–112)
Creatinine, Ser: 0.62 mg/dL (ref 0.40–1.20)
GFR: 101.44 mL/min (ref >60.00)
Glucose, Bld: 107 mg/dL — ABNORMAL HIGH (ref 70–99)
Phosphorus: 4.2 mg/dL (ref 2.3–4.6)
Potassium: 4.2 meq/L (ref 3.5–5.1)
Sodium: 138 meq/L (ref 135–145)

## 2022-12-28 LAB — CBC
HCT: 39.3 % (ref 36.0–46.0)
Hemoglobin: 12.5 g/dL (ref 12.0–15.0)
MCHC: 31.9 g/dL (ref 30.0–36.0)
MCV: 88.3 fL (ref 78.0–100.0)
Platelets: 382 10*3/uL (ref 150.0–400.0)
RBC: 4.45 Mil/uL (ref 3.87–5.11)
RDW: 14.6 % (ref 11.5–15.5)
WBC: 6.5 10*3/uL (ref 4.0–10.5)

## 2022-12-28 LAB — POCT GLYCOSYLATED HEMOGLOBIN (HGB A1C): Hemoglobin A1C: 6.5 % — AB (ref 4.0–5.6)

## 2022-12-28 LAB — IBC + FERRITIN
Ferritin: 18.3 ng/mL (ref 10.0–291.0)
Iron: 103 ug/dL (ref 42–145)
Saturation Ratios: 27.9 % (ref 20.0–50.0)
TIBC: 369.6 ug/dL (ref 250.0–450.0)
Transferrin: 264 mg/dL (ref 212.0–360.0)

## 2022-12-28 NOTE — Assessment & Plan Note (Addendum)
Repeat hgba1c at 6.5% Advised about importance of dietary modifications.She has upcoming appointment with nutritionist. F/up in 3months

## 2022-12-28 NOTE — Assessment & Plan Note (Signed)
>>  ASSESSMENT AND PLAN FOR ANEURYSM, CAROTID ARTERY, INTERNAL WRITTEN ON 12/28/2022 12:49 PM BY Bolden Hagerman LUM, NP  Cardiac ablation cancelled due to need for f/up with IR. Provided central scheduling number to call: [phone]

## 2022-12-28 NOTE — Assessment & Plan Note (Signed)
Generalized joint pain and stiffness, worse in AM, stiffness last <35mins No joint swelling or redness or effusion. No use of statin drug at this time. Normal THYROID  Check Arthritis panel, CBC, iron

## 2022-12-28 NOTE — Addendum Note (Signed)
Addended by: Clearnce Sorrel on: 12/28/2022 02:33 PM   Modules accepted: Orders

## 2022-12-28 NOTE — Progress Notes (Signed)
Established Patient Visit  Patient: Emma Stephens   DOB: 04-16-1970   52 y.o. Female  MRN: [id number] Visit Date: 12/28/2022  Subjective:    Chief Complaint  Patient presents with   Consult    Discuss surgery, lab work and FMLA   HPI Needs FMLA completed for frequent cardiac procedures and need for daughter to provide transportation and care. Advised to have form completed by cardiology.  Prediabetes Repeat hgba1c at 6.5% Advised about importance of dietary modifications.She has upcoming appointment with nutritionist. F/up in 3months  Aneurysm, carotid artery, internal Cardiac ablation cancelled due to need for f/up with IR. Provided central scheduling number to call: [phone]  Iron deficiency anemia Need for repeat colonoscopy, but on hold at this time till completion of cardiac workup No GI or GU or vaginal bleed. Repeat cbc and IBC  Arthralgia Generalized joint pain and stiffness, worse in AM, stiffness last <100mins No joint swelling or redness or effusion. No use of statin drug at this time. Normal THYROID  Check Arthritis panel, CBC, iron  Wt Readings from Last 3 Encounters:  12/28/22 176 lb 6.4 oz (80 kg)  12/20/22 175 lb 6.4 oz (79.6 kg)  11/24/22 169 lb (76.7 kg)    BP Readings from Last 3 Encounters:  12/28/22 122/76  12/20/22 122/62  11/24/22 131/71    Reviewed medical, surgical, and social history today  Medications: Outpatient Medications Prior to Visit  Medication Sig   apixaban (ELIQUIS) 5 MG TABS tablet Take 1 tablet (5 mg total) by mouth 2 (two) times daily.   Cholecalciferol (VITAMIN D-3 PO) Take 1,000 Units by mouth daily.   cyanocobalamin (VITAMIN B12) 1000 MCG tablet Take 1 tablet (1,000 mcg total) by mouth daily.   famotidine (PEPCID) 20 MG tablet Take 1 tablet (20 mg total) by mouth daily. Need office visit for additional refill   flecainide (TAMBOCOR) 100 MG tablet Take 1 tablet (100 mg total) by mouth 2 (two) times  daily.   losartan (COZAAR) 25 MG tablet Take 1 tablet (25 mg total) by mouth in the morning.   metoprolol succinate (TOPROL-XL) 25 MG 24 hr tablet TAKE 1 TABLET (25 MG TOTAL) BY MOUTH DAILY.   sertraline (ZOLOFT) 50 MG tablet Take 1 tablet (50 mg total) by mouth daily.   spironolactone (ALDACTONE) 25 MG tablet TAKE 1 TABLET (25 MG TOTAL) BY MOUTH DAILY.   [DISCONTINUED] diltiazem (CARDIZEM) 30 MG tablet Take 1 tablet (30 mg total) by mouth once as needed for up to 1 dose (for Afib). (Patient not taking: Reported on 12/28/2022)   No facility-administered medications prior to visit.   Reviewed past medical and social history.   ROS per HPI above      Objective:  BP 122/76   Pulse 73   Temp 97.9 F (36.6 C) (Temporal)   Ht 5\' 1"  (1.549 m)   Wt 176 lb 6.4 oz (80 kg)   LMP 10/09/2022   SpO2 98%   BMI 33.33 kg/m      Physical Exam Vitals and nursing note reviewed.  Cardiovascular:     Rate and Rhythm: Normal rate.     Pulses: Normal pulses.  Pulmonary:     Effort: Pulmonary effort is normal.  Musculoskeletal:        General: No swelling or deformity.     Right lower leg: No edema.     Left lower leg: No edema.  Neurological:     Mental Status: She is oriented to person, place, and time.     Results for orders placed or performed in visit on 12/28/22  POCT glycosylated hemoglobin (Hb A1C)  Result Value Ref Range   Hemoglobin A1C 6.5 (A) 4.0 - 5.6 %   HbA1c POC (<> result, manual entry)     HbA1c, POC (prediabetic range)     HbA1c, POC (controlled diabetic range)        Assessment & Plan:    Problem List Items Addressed This Visit     Aneurysm, carotid artery, internal    Cardiac ablation cancelled due to need for f/up with IR. Provided central scheduling number to call: [phone]      Arthralgia    Generalized joint pain and stiffness, worse in AM, stiffness last <60mins No joint swelling or redness or effusion. No use of statin drug at this time. Normal  THYROID  Check Arthritis panel, CBC, iron      Relevant Orders   Renal Function Panel   Arthritis Panel   Iron deficiency anemia    Need for repeat colonoscopy, but on hold at this time till completion of cardiac workup No GI or GU or vaginal bleed. Repeat cbc and IBC      Relevant Orders   CBC   IBC + Ferritin   Prediabetes - Primary    Repeat hgba1c at 6.5% Advised about importance of dietary modifications.She has upcoming appointment with nutritionist. F/up in 3months      Relevant Orders   POCT glycosylated hemoglobin (Hb A1C) (Completed)   Renal Function Panel   Return in about 3 months (around 03/30/2023) for DM, HTN, hyperlipidemia (fasting).     Alysia Penna, NP

## 2022-12-28 NOTE — Assessment & Plan Note (Addendum)
Need for repeat colonoscopy, but on hold at this time till completion of cardiac workup No GI or GU or vaginal bleed. Repeat cbc and IBC

## 2022-12-28 NOTE — Addendum Note (Signed)
Addended by: Clearnce Sorrel on: 12/28/2022 03:11 PM   Modules accepted: Orders

## 2022-12-28 NOTE — Patient Instructions (Addendum)
Need to discuss FMLA and flecanide dose with cardiology. Maintain appointment with nutritionist Call [phone] to schedule appointment with interventional radiology.

## 2022-12-28 NOTE — Assessment & Plan Note (Signed)
Cardiac ablation cancelled due to need for f/up with IR. Provided central scheduling number to call: [phone]

## 2022-12-29 ENCOUNTER — Ambulatory Visit (HOSPITAL_COMMUNITY): Payer: BC Managed Care – PPO | Attending: Cardiology

## 2022-12-29 ENCOUNTER — Ambulatory Visit: Payer: BC Managed Care – PPO | Admitting: Nurse Practitioner

## 2022-12-29 ENCOUNTER — Telehealth: Payer: Self-pay | Admitting: Cardiology

## 2022-12-29 DIAGNOSIS — I480 Paroxysmal atrial fibrillation: Secondary | ICD-10-CM | POA: Diagnosis not present

## 2022-12-29 LAB — ARTHRITIS PANEL
Anti Nuclear Antibody (ANA): NEGATIVE
Rheumatoid fact SerPl-aCnc: <10 [IU]/mL (ref <14.0)
Sed Rate: 14 mm/h (ref 0–40)
Uric Acid: 5.4 mg/dL (ref 3.0–7.2)

## 2022-12-29 LAB — EXERCISE TOLERANCE TEST
Angina Index: 0
Duke Treadmill Score: 7
Estimated workload: 8.5
Exercise duration (min): 7 min
Exercise duration (sec): 0 s
MPHR: 167 {beats}/min
Peak HR: 125 {beats}/min
Percent HR: 74 %
Rest HR: 73 {beats}/min
ST Depression (mm): 0 mm

## 2022-12-29 LAB — SPECIMEN STATUS REPORT

## 2022-12-29 NOTE — Telephone Encounter (Signed)
Patient stopped on her way out of the office after her ETT. Patient was confused on what is going on at this point. Patient just had her ETT. Informed patient at this time she needs to see Interventional Neurology and keep follow-up with Dr. Jimmey Ralph on 04/01/23. Patient will call Dr. Lissa Morales office to get an appointment with her.

## 2022-12-29 NOTE — Telephone Encounter (Signed)
See previous phone note, patient spoke with Elita Quick, RN.

## 2022-12-29 NOTE — Telephone Encounter (Signed)
Pt came in for exercise stress test, asking about an ablation that was cancelled due to an annorisim (sp). Can someone call her and advise on the next steps please, she is here in office for stress test now if someone can do so now!

## 2022-12-31 ENCOUNTER — Other Ambulatory Visit: Payer: Self-pay | Admitting: Nurse Practitioner

## 2022-12-31 DIAGNOSIS — I10 Essential (primary) hypertension: Secondary | ICD-10-CM

## 2022-12-31 DIAGNOSIS — N951 Menopausal and female climacteric states: Secondary | ICD-10-CM

## 2023-01-04 ENCOUNTER — Telehealth (HOSPITAL_COMMUNITY): Payer: Self-pay

## 2023-01-04 NOTE — Telephone Encounter (Signed)
Called to schedule consult with Dr. Quay Burow, no answer, left vm. AB

## 2023-01-06 LAB — HM DIABETES EYE EXAM

## 2023-01-11 ENCOUNTER — Other Ambulatory Visit: Payer: Self-pay | Admitting: Nurse Practitioner

## 2023-01-19 ENCOUNTER — Ambulatory Visit: Payer: BC Managed Care – PPO | Admitting: Dietician

## 2023-01-20 ENCOUNTER — Other Ambulatory Visit: Payer: Self-pay | Admitting: Nurse Practitioner

## 2023-01-20 DIAGNOSIS — R1030 Lower abdominal pain, unspecified: Secondary | ICD-10-CM

## 2023-01-24 ENCOUNTER — Other Ambulatory Visit (HOSPITAL_COMMUNITY): Payer: BC Managed Care – PPO

## 2023-01-25 ENCOUNTER — Ambulatory Visit (INDEPENDENT_AMBULATORY_CARE_PROVIDER_SITE_OTHER): Payer: BC Managed Care – PPO

## 2023-01-25 ENCOUNTER — Other Ambulatory Visit: Payer: BC Managed Care – PPO

## 2023-01-25 DIAGNOSIS — K5900 Constipation, unspecified: Secondary | ICD-10-CM | POA: Diagnosis not present

## 2023-01-25 DIAGNOSIS — R1030 Lower abdominal pain, unspecified: Secondary | ICD-10-CM | POA: Diagnosis not present

## 2023-01-25 DIAGNOSIS — K5730 Diverticulosis of large intestine without perforation or abscess without bleeding: Secondary | ICD-10-CM | POA: Diagnosis not present

## 2023-01-25 DIAGNOSIS — N83202 Unspecified ovarian cyst, left side: Secondary | ICD-10-CM

## 2023-01-25 DIAGNOSIS — R194 Change in bowel habit: Secondary | ICD-10-CM

## 2023-01-25 DIAGNOSIS — N83292 Other ovarian cyst, left side: Secondary | ICD-10-CM | POA: Diagnosis not present

## 2023-01-25 MED ORDER — IOHEXOL 300 MG/ML  SOLN
100.0000 mL | Freq: Once | INTRAMUSCULAR | Status: AC | PRN
  Administered 2023-01-25: 100 mL via INTRAVENOUS

## 2023-01-28 ENCOUNTER — Other Ambulatory Visit: Payer: Self-pay | Admitting: Nurse Practitioner

## 2023-01-28 DIAGNOSIS — I10 Essential (primary) hypertension: Secondary | ICD-10-CM

## 2023-01-31 DIAGNOSIS — N83202 Unspecified ovarian cyst, left side: Secondary | ICD-10-CM | POA: Insufficient documentation

## 2023-01-31 DIAGNOSIS — R1030 Lower abdominal pain, unspecified: Secondary | ICD-10-CM | POA: Insufficient documentation

## 2023-02-03 ENCOUNTER — Ambulatory Visit (HOSPITAL_COMMUNITY)
Admission: RE | Admit: 2023-02-03 | Discharge: 2023-02-03 | Disposition: A | Discharge status: Home / Self Care | Payer: BC Managed Care – PPO | Source: Ambulatory Visit | Attending: Neuroradiology | Admitting: Neuroradiology

## 2023-02-03 DIAGNOSIS — I671 Cerebral aneurysm, nonruptured: Secondary | ICD-10-CM

## 2023-02-04 NOTE — Consult Note (Signed)
Chief Complaint: Patient was seen in consultation today for brain aneurysm.  Referring Physician(s): Nobie Putnam, MD  Supervising Physician: Baldemar Lenis  Patient Status: [facility] - Out-pt  History of Present Illness: Emma Stephens is a 52 year old female with past medical history significant for hypertension, NEC, obstructive sleep apnea and paroxysmal atrial fibrillation on Eliquis.  She presented to the emergency on 09/08/2022 with palpitations and chest pain.  At arrival, she developed left-sided weakness with spontaneous resolution after 2-3 hours.  She underwent a CT angiogram of the head and neck at that time for workup of the left-sided weakness episode which show no evidence of vessel occlusion or significant stenosis.  However, a 6 x 3 mm left ICA ophthalmic segment aneurysm was incidentally discovered.  She comes today to discuss CTA findings.    She denies family history of brain aneurysm or subarachnoid hemorrhage.  She does not currently smoke cigarette but has a 15 pack/year history.  She quit 1 year ago and currently vapes.   Past Medical History:  Diagnosis Date   Acute gout 07/30/2014   Anemia    Anxiety    Blood transfusion without reported diagnosis    Cigarette nicotine dependence    GERD (gastroesophageal reflux disease)    Gout 2011   Helicobacter pylori gastritis 02/19/2022   Hypertension    Malignant hyperthermia    PAF (paroxysmal atrial fibrillation) (HCC)    PVC (premature ventricular contraction)    Sleep apnea    not on cpap at this time 01-17-20    Past Surgical History:  Procedure Laterality Date   BIOPSY  02/21/2022   Procedure: BIOPSY;  Surgeon: Jeani Hawking, MD;  Location: [facility];  Service: Gastroenterology;;   CHOLECYSTECTOMY N/A 02/23/2022   Procedure: LAPAROSCOPIC CHOLECYSTECTOMY with Lysis of Adhessions;  Surgeon: Quentin Ore, MD;  Location: [facility];  Service: General;  Laterality: N/A;    ESOPHAGOGASTRODUODENOSCOPY (EGD) WITH PROPOFOL N/A 02/21/2022   Procedure: ESOPHAGOGASTRODUODENOSCOPY (EGD) WITH PROPOFOL;  Surgeon: Jeani Hawking, MD;  Location: [facility];  Service: Gastroenterology;  Laterality: N/A;   NECK SURGERY     TUBAL LIGATION      Allergies: Lisinopril and Bupropion  Medications: Prior to Admission medications   Medication Sig Start Date End Date Taking? Authorizing Provider  apixaban (ELIQUIS) 5 MG TABS tablet Take 1 tablet (5 mg total) by mouth 2 (two) times daily. 09/10/22   Nche, Bonna Gains, NP  Cholecalciferol (VITAMIN D-3 PO) Take 1,000 Units by mouth daily.    [provider]  cyanocobalamin (VITAMIN B12) 1000 MCG tablet Take 1 tablet (1,000 mcg total) by mouth daily. 02/24/22   Lorin Glass, MD  famotidine (PEPCID) 20 MG tablet Take 1 tablet (20 mg total) by mouth daily. 01/13/23   Nche, Bonna Gains, NP  flecainide (TAMBOCOR) 100 MG tablet Take 1 tablet (100 mg total) by mouth 2 (two) times daily. 12/20/22   Nobie Putnam, MD  losartan (COZAAR) 25 MG tablet TAKE 1 TABLET (25 MG TOTAL) BY MOUTH IN THE MORNING 12/31/22   Nche, Bonna Gains, NP  metoprolol succinate (TOPROL-XL) 25 MG 24 hr tablet TAKE 1 TABLET (25 MG TOTAL) BY MOUTH DAILY. 07/26/22   Nche, Bonna Gains, NP  sertraline (ZOLOFT) 50 MG tablet TAKE 1 TABLET BY MOUTH EVERY DAY 12/31/22   Nche, Bonna Gains, NP  spironolactone (ALDACTONE) 25 MG tablet TAKE 1 TABLET (25 MG TOTAL) BY MOUTH DAILY. 01/28/23   Nche, Bonna Gains, NP     Family History  Problem Relation Age of Onset   Diabetes Mother    Hypertension Mother    Cancer Father 60       oral   Diabetes Sister    Hypertension Sister    Diabetes Brother    Hypertension Brother    Breast cancer Paternal Grandmother    Sudden Cardiac Death Neg Hx    Heart attack Neg Hx    Colon cancer Neg Hx    Esophageal cancer Neg Hx    Rectal cancer Neg Hx    Stomach cancer Neg Hx     Social History   Socioeconomic History    Marital status: Married    Spouse name: Not on file   Number of children: Not on file   Years of education: Not on file   Highest education level: Some college, no degree  Occupational History   Occupation: Magazine features editor: [employer]  Tobacco Use   Smoking status: Former    Current packs/day: 0.00    Average packs/day: 0.5 packs/day for 31.0 years (15.5 ttl pk-yrs)    Types: Cigarettes    Start date: 11/03/1990    Quit date: 11/02/2021    Years since quitting: 1.2   Smokeless tobacco: Never  Vaping Use   Vaping status: Some Days  Substance and Sexual Activity   Alcohol use: No   Drug use: No   Sexual activity: Yes    Partners: Male    Birth control/protection: Post-menopausal  Other Topics Concern   Not on file  Social History Narrative   Lives in [place] with family.   Social Determinants of Health   Financial Resource Strain: Low Risk  (08/31/2022)   Overall Financial Resource Strain (CARDIA)    Difficulty of Paying Living Expenses: Not hard at all  Food Insecurity: No Food Insecurity (08/31/2022)   Hunger Vital Sign    Worried About Running Out of Food in the Last Year: Never true    Ran Out of Food in the Last Year: Never true  Transportation Needs: No Transportation Needs (08/31/2022)   PRAPARE - Administrator, Civil Service (Medical): No    Lack of Transportation (Non-Medical): No  Physical Activity: Unknown (08/31/2022)   Exercise Vital Sign    Days of Exercise per Week: 0 days    Minutes of Exercise per Session: Not on file  Stress: Patient Declined (08/31/2022)   [employer] of Occupational Health - Occupational Stress Questionnaire    Feeling of Stress : Patient declined  Social Connections: Unknown (08/31/2022)   Social Connection and Isolation Panel [NHANES]    Frequency of Communication with Friends and Family: More than three times a week    Frequency of Social Gatherings with Friends and Family: Once a week    Attends Religious  Services: More than 4 times per year    Active Member of [employer] or Organizations: Not on file    Attends Banker Meetings: Not on file    Marital Status: Married     Review of Systems: A 12 point ROS discussed and pertinent positives are indicated in the HPI above.  All other systems are negative.  Review of Systems  Vital Signs: There were no vitals taken for this visit.  Physical Exam Constitutional:      Appearance: Normal appearance.  HENT:     Head: Normocephalic.     Mouth/Throat:     Mouth: Mucous membranes are moist.     Pharynx: Oropharynx is clear.  Eyes:     Extraocular Movements: Extraocular movements intact.     Conjunctiva/sclera: Conjunctivae normal.     Pupils: Pupils are equal, round, and reactive to light.  Neurological:     Mental Status: She is alert and oriented to person, place, and time.     Cranial Nerves: Cranial nerves 2-12 are intact.     Sensory: Sensation is intact.     Motor: Motor function is intact.     Coordination: Coordination is intact.     Gait: Gait is intact.          Imaging:  EXAM: CT ANGIOGRAPHY HEAD AND NECK WITH AND WITHOUT CONTRAST   TECHNIQUE: Multidetector CT imaging of the head and neck was performed using the standard protocol during bolus administration of intravenous contrast. Multiplanar CT image reconstructions and MIPs were obtained to evaluate the vascular anatomy. Carotid stenosis measurements (when applicable) are obtained utilizing NASCET criteria, using the distal internal carotid diameter as the denominator.   RADIATION DOSE REDUCTION: This exam was performed according to the departmental dose-optimization program which includes automated exposure control, adjustment of the mA and/or kV according to patient size and/or use of iterative reconstruction technique.   CONTRAST:  75mL OMNIPAQUE IOHEXOL 350 MG/ML SOLN   COMPARISON:  None Available.   FINDINGS: CTA NECK FINDINGS    SKELETON: There is ossification of the posterior longitudinal ligament at the C3-6 levels, mildly narrowing the spinal canal. C6-T1 anterior fusion with graft material.   OTHER NECK: Normal pharynx, larynx and major salivary glands. No cervical lymphadenopathy. Unremarkable thyroid gland.   UPPER CHEST: No pneumothorax or pleural effusion. No nodules or masses.   AORTIC ARCH:   There is calcific atherosclerosis of the aortic arch. There is no aneurysm, dissection or hemodynamically significant stenosis of the visualized portion of the aorta. Conventional 3 vessel aortic branching pattern. The visualized proximal subclavian arteries are widely patent.   RIGHT CAROTID SYSTEM: Normal without aneurysm, dissection or stenosis.   LEFT CAROTID SYSTEM: Normal without aneurysm, dissection or stenosis.   VERTEBRAL ARTERIES: Left dominant configuration. Both origins are clearly patent. There is no dissection, occlusion or flow-limiting stenosis to the skull base (V1-V3 segments).   CTA HEAD FINDINGS   POSTERIOR CIRCULATION:   --Vertebral arteries: Normal V4 segments.   --Inferior cerebellar arteries: Normal.   --Basilar artery: Normal.   --Superior cerebellar arteries: Normal.   --Posterior cerebral arteries (PCA): Normal.   ANTERIOR CIRCULATION:   --Intracranial internal carotid arteries: There is a 6 x 3 mm aneurysm projecting anteriorly from the supraclinoid left ICA (sagittal series 9, image 124). There is mild atherosclerotic irregularity of both internal carotid artery cavernous segments, left-greater-than-right.   --Anterior cerebral arteries (ACA): Normal. Both A1 segments are present. Patent anterior communicating artery (a-comm).   --Middle cerebral arteries (MCA): Normal.   VENOUS SINUSES: As permitted by contrast timing, patent.   ANATOMIC VARIANTS: Fetal origin of the right posterior cerebral artery.   Review of the MIP images confirms the above  findings.   IMPRESSION: 1. No emergent large vessel occlusion or high-grade stenosis of the intracranial arteries. 2. A 6 x 3 mm aneurysm projecting anteriorly from the supraclinoid left ICA. 3. 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     Electronically Signed   By: Deatra Robinson M.D.   On: 09/08/2022 23:28  Labs:  CBC: Recent Labs    08/31/22 1045 09/08/22 1235 11/23/22 1207 12/28/22 0936  WBC 6.8  8.5 6.8 6.5  HGB 12.6 12.9 13.6 12.5  HCT 38.9 38.5 40.8 39.3  PLT 384.0 434* 434* 382.0    COAGS: Recent Labs    09/08/22 1235  INR 1.0  APTT 27    BMP: Recent Labs    02/20/22 0506 02/24/22 0458 03/24/22 1033 08/31/22 1045 09/08/22 1235 11/23/22 1207 12/28/22 0936  NA 138 137   < > 138 134* 137 138  K 3.7 4.2   < > 4.1 4.0 3.7 4.2  CL 104 99   < > 100 104 100 102  CO2 27 28   < > 27 21* 25 29  GLUCOSE 91 128*   < > 81 138* 195* 107*  BUN 11 9   < > 11 13 11 9   CALCIUM 8.6* 9.5   < > 9.9 9.1 9.6 9.5  CREATININE 0.62 0.68   < > 0.70 0.75 0.72 0.62  GFRNONAA >60 >60  --   --  >60 >60  --    < > = values in this interval not displayed.    LIVER FUNCTION TESTS: Recent Labs    07/09/22 0923 08/31/22 1045 09/08/22 1235 11/23/22 1207 12/28/22 0936  BILITOT 0.4 0.4 0.5 0.7  --   AST 15 21 23 22   --   ALT 10 22 17 17   --   ALKPHOS 92 75 82 80  --   PROT 7.3 7.7 7.7 7.9  --   ALBUMIN 4.5 4.6 4.2 4.3 4.3    TUMOR MARKERS: No results for input(s): "AFPTM", "CEA", "CA199", "CHROMGRNA" in the last 8760 hours.  Assessment and Plan:  Mrs. Teves is a 52 year old female with incidental left ICA aneurysm in the setting of anticoagulation with Eliquis for paroxysmal atrial fibrillation.  I personally reviewed her CT angiogram with the patient and her family.  Her ICA aneurysm has an irregular configuration and arises at or near the expected origin of the left ophthalmic artery.  We discussed  signs and symptoms of a subarachnoid hemorrhage and they were instructed to call 911 if there is suspicion for aneurysm rupture.  Given her atrial fibrillation, we also reviewed signs and symptoms of a ischemic stroke.  I recommended a diagnostic cerebral angiogram to better evaluate size and shape of the aneurysm, as well as its relationship with the parent artery.  I explained that aneurysm in this location are usually, but not always, treated with a flow diverter stent which requires anti-platelet therapy.  Options of open neuro surgical clipping and medical management were also discussed.  Patient is very fearful to undergo a diagnostic angiogram under moderate sedation due to anxiety.  She would prefer to proceed with a diagnostic cerebral angiogram under general anesthesia with intention to treat.  I explained that in this case we will need to decide specific treatment methods, such as coiling, flow diverter or intra saccular device at the time of the procedure, after obtaining angiographic images.  She understands and agree with the plan.  She is expected to undergo ablation for her atrial fibrillation in the near future which will require continued anticoagulation for at least 3 months.  This is planned to happen after she undergoes brain aneurysm treatment.  We can proceed with transitioning from Eliquis to dual anti-platelet therapy with aspirin and Brilinta 7 days prior to intervention and transitioning to aspirin plus Eliquis after intervention.  This has been discussed the with Dr. Willaim Bane before via epic message.  We will seek insurance authorization to schedule her  intervention under general anesthesia.   Thank you for this interesting consult.  I greatly enjoyed meeting Marly Camblin and look forward to participating in her care.  A copy of this report was sent to the requesting provider on this date.  Electronically Signed: Baldemar Lenis, MD 02/04/2023, 3:02 PM   I spent a  total of  40 Minutes   in face to face in clinical consultation, greater than 50% of which was counseling/coordinating care for intracranial left ICA aneurysm.

## 2023-02-07 ENCOUNTER — Ambulatory Visit (HOSPITAL_COMMUNITY): Admit: 2023-02-07 | Payer: BC Managed Care – PPO | Admitting: Cardiology

## 2023-02-07 ENCOUNTER — Encounter (HOSPITAL_COMMUNITY): Payer: Self-pay

## 2023-02-07 ENCOUNTER — Encounter: Payer: Self-pay | Admitting: Gastroenterology

## 2023-02-07 SURGERY — ATRIAL FIBRILLATION ABLATION
Anesthesia: General

## 2023-02-10 ENCOUNTER — Other Ambulatory Visit (HOSPITAL_COMMUNITY): Payer: Self-pay | Admitting: Neuroradiology

## 2023-02-10 ENCOUNTER — Telehealth (HOSPITAL_COMMUNITY): Payer: Self-pay | Admitting: Student

## 2023-02-10 DIAGNOSIS — I671 Cerebral aneurysm, nonruptured: Secondary | ICD-10-CM

## 2023-02-10 MED ORDER — TICAGRELOR 90 MG PO TABS: 90.0000 mg | ORAL_TABLET | Freq: Two times a day (BID) | ORAL | 3 refills | Status: DC

## 2023-02-10 NOTE — Telephone Encounter (Signed)
Patient scheduled for endovascular treatment of brain aneurysm with Dr. Tommie Sams 02/23/23. Patient notified to discontinue Eliquis 02/15/23 and begin taking 81 mg aspirin and 90 mg Brilinta BID starting 02/16/23.   Prescription for brilinta e-prescribed to the [facility] in [place] [facility] Patient aware to arrive at [facility] 02/23/23 at 0600 and she knows to take all her morning medications (including aspirin and brilinta) with a small sip of water Patient verbalized understanding of all instructions discussed. She knows she can call our office with questions/concerns prior to her procedure date.   Alwyn Ren, [place] [phone] 02/10/2023, 10:44 AM

## 2023-02-21 ENCOUNTER — Other Ambulatory Visit: Payer: Self-pay

## 2023-02-21 ENCOUNTER — Encounter (HOSPITAL_COMMUNITY): Payer: Self-pay | Admitting: Neuroradiology

## 2023-02-21 NOTE — Progress Notes (Addendum)
SDW CALL  Patient was given pre-op instructions over the phone. The opportunity was given for the patient to ask questions. No further questions asked. Patient verbalized understanding of instructions given.   PCP - Ceasar Lund Cardiologist - Ferman Hamming, Ivin Booty Parker,MD  PPM/ICD - denies Device Orders -  Rep Notified -   Chest x-ray - na EKG - 11/23/22 Stress Test - 12/29/22 ECHO - 12/09/22 Cardiac Cath - denies  Sleep Study - 01/24/20 CPAP - no  Fasting Blood Sugar - na Checks Blood Sugar _____ times a day  Blood Thinner Instructions:per IR-stop Eliquis 11/26.start Brilinta 11/27. Aspirin Instructions:start Aspirin 11/27 per IR.   ERAS Protcol -no PRE-SURGERY Ensure or G2-   COVID TEST- na   Anesthesia review: yes- Malignant hyperthermia added to history  02/23/22. Pt unaware of this history. She had surgery on this date as well. According to anesthesiologist's postpreprocedure note there were no anesthesia complications. Pt has history of afib,Hypertension,sleep apnea.   Patient denies shortness of breath, fever, cough and chest pain over the phone call    Surgical Instructions    Your procedure is scheduled on December 4.  Report to [facility] Main Entrance "A" at 0630 A.M., then check in with the Admitting office.  Call this number if you have problems the morning of surgery:  [phone]    Remember:  Do not eat or drink anything  after midnight the night before your surgery   Take these medicines the morning of surgery with A SIP OF WATER: Aspirin,Brilinta, Pepcid,Fecainide,Toprol XL,Zoloft  As of today, STOP taking any Aleve, Naproxen, Ibuprofen, Motrin, Advil, Goody's, BC's, all herbal medications, fish oil, and all vitamins.  [place] is not responsible for any belongings or valuables. .   Do NOT Smoke (Tobacco/Vaping)  24 hours prior to your procedure  If you use a CPAP at night, you may bring your mask for your overnight stay.    Contacts, glasses, hearing aids, dentures or partials may not be worn into surgery, please bring cases for these belongings   Patients discharged the day of surgery will not be allowed to drive home, and someone needs to stay with them for 24 hours.    Special instructions:    Oral Hygiene is also important to reduce your risk of infection.  Remember - BRUSH YOUR TEETH THE MORNING OF SURGERY WITH YOUR REGULAR TOOTHPASTE   Day of Surgery:  Take a shower the day of or night before with antibacterial soap. Wear Clean/Comfortable clothing the morning of surgery Do not apply any deodorants/lotions.   Do not wear jewelry or makeup Do not wear lotions, powders, perfumes/colognes, or deodorant. Do not shave 48 hours prior to surgery.  Men may shave face and neck. Do not bring valuables to the hospital. Do not wear nail polish, gel polish, artificial nails, or any other type of covering on natural nails (fingers and toes) If you have artificial nails or gel coating that need to be removed by a nail salon, please have this removed prior to surgery. Artificial nails or gel coating may interfere with anesthesia's ability to adequately monitor your vital signs. Remember to brush your teeth WITH YOUR REGULAR TOOTHPASTE.

## 2023-02-22 ENCOUNTER — Encounter (HOSPITAL_COMMUNITY): Payer: Self-pay | Admitting: Neuroradiology

## 2023-02-22 ENCOUNTER — Other Ambulatory Visit: Payer: Self-pay | Admitting: Student

## 2023-02-22 DIAGNOSIS — Z01818 Encounter for other preprocedural examination: Secondary | ICD-10-CM

## 2023-02-22 NOTE — Anesthesia Preprocedure Evaluation (Signed)
Anesthesia Evaluation  Patient identified by MRN, date of birth, ID band Patient awake    Airway Mallampati: III  TM Distance: >3 FB Neck ROM: Full    Dental  (+) Teeth Intact, Dental Advisory Given   Pulmonary neg shortness of breath, sleep apnea , neg COPD, Patient abstained from smoking., former smoker   breath sounds clear to auscultation       Cardiovascular hypertension, Pt. on medications and Pt. on home beta blockers  Rhythm:Regular     Neuro/Psych   Anxiety     Brain aneurysm  Neuromuscular disease    GI/Hepatic Neg liver ROS,GERD  Controlled,,  Endo/Other  negative endocrine ROS    Renal/GU negative Renal ROS     Musculoskeletal negative musculoskeletal ROS (+)    Abdominal   Peds  Hematology negative hematology ROS (+) Lab Results      Component                Value               Date                      WBC                      6.9                 02/23/2023                HGB                      13.2                02/23/2023                HCT                      40.7                02/23/2023                MCV                      89.6                02/23/2023                PLT                      388                 02/23/2023              Anesthesia Other Findings   Reproductive/Obstetrics                             Anesthesia Physical Anesthesia Plan  ASA: 3  Anesthesia Plan: General   Post-op Pain Management: Minimal or no pain anticipated   Induction: Intravenous  PONV Risk Score and Plan: 3 and Ondansetron, Dexamethasone and Propofol infusion  Airway Management Planned: Oral ETT  Additional Equipment: Arterial line  Intra-op Plan:   Post-operative Plan: Extubation in OR  Informed Consent: I have reviewed the patients History and Physical, chart, labs and discussed the procedure including the risks, benefits and alternatives for the proposed  anesthesia with the patient or authorized  representative who has indicated his/her understanding and acceptance.     Dental advisory given  Plan Discussed with: CRNA  Anesthesia Plan Comments: (PAT note written 02/22/2023 by Shonna Chock, PA-C. History of afib with plans for ablation in the future.   On 02/23/22 a RN added malignant hyperthermia to patient's history; However, on 02/21/23, she denied knowledge of this. In fact, she underwent cholecystectomy on 02/23/22 and sevoflurane was used. There were no known anesthetic complications. She was discharged home on POD #1. Appears to be an erroneous diagnosis for patient.   )       Anesthesia Quick Evaluation

## 2023-02-22 NOTE — Progress Notes (Signed)
Anesthesia Chart Review: Emma Stephens  Case: [id number] Date/Time: 02/23/23 0815   Procedure: Aneurysm embolization   Anesthesia type: General   Pre-op diagnosis: Brain aneurysm   [facility] RAD[facility]   Surgeons: de Glori Luis, MD       DISCUSSION: Patient is a 52 year old female scheduled for the above procedure. She had an incidental finding of a  6 x 3 mm aneurysm projecting anteriorly from the supraclinoid left ICA on 09/08/22 CTA during evaluation for transient left sided weakness in setting of afib with RVR without anticoagulation. MRI was negative for CVA. Symptoms attributed to symptomatic afib. She was treated with metroprolol and diltiazem. Eliquis was recommended with follow-up with cardiology. Admitted 11/23/22-11/24/22 after persistent afib with RVR with plans for DCCV, but she converted spontaneously. Referral to Dr. Jimmey Ralph with EP recommended to discussion possibility of future ablation.   She was evaluated by EP Dr. Jimmey Ralph on 12/20/22. Flecainide started and diltiazem discontinued. Catheter ablation was discussed, but he also reached out to neurology if increased risk of ICH given ICA aneurysm and also potential management of ICA aneurysm. Since then, she was evaluated by IR Dr. Joana Reamer on 02/03/23. Her note states, "She is expected to undergo ablation for her atrial fibrillation in the near future which will require continued anticoagulation for at least 3 months. This is planned to happen after she undergoes brain aneurysm treatment. We can proceed with transitioning from Eliquis to dual anti-platelet therapy with aspirin and Brilinta 7 days prior to intervention and transitioning to aspirin plus Eliquis after intervention. This has been discussed the with Dr. Willaim Bane before via epic message."   Other history includes former smoker (quit 11/02/21), HTN, PVCs, PAF (diagnosed 06/2018), GERD, OSA, gout, anemia, spinal surgery, cholecystectomy (02/23/22).   Of  note, on 02/23/22 a RN added malignant hyperthermia to patient's history; However, on 02/21/23, the patient denied knowledge of this. In fact, she underwent cholecystectomy on 02/23/22 and sevoflurane was used. There were no known anesthetic complications. She was discharged home on POD #1. Appears to be an erroneous diagnosis for patient. I discussed this with anesthesiologist Eilene Ghazi, MD.   Per IR, Eliquis to be held starting 02/15/23 and to begin Brilinta and ASA 02/16/23.   Anesthesia team to evaluate on the day of surgery.   VS: LMP 02/02/2023 (Exact Date)  BP Readings from Last 3 Encounters:  12/28/22 122/76  12/20/22 122/62  11/24/22 131/71   Pulse Readings from Last 3 Encounters:  12/28/22 73  12/20/22 76  11/24/22 78    PROVIDERS: Nche, Bonna Gains, NP is PCP  Nobie Putnam, MD & Sherryl Manges, MD are her EP cardiologists   LABS: On day of procedure as indicated. Most recent results in [facility] include: Lab Results  Component Value Date   WBC 6.5 12/28/2022   HGB 12.5 12/28/2022   HCT 39.3 12/28/2022   PLT 382.0 12/28/2022   GLUCOSE 107 (H) 12/28/2022   CHOL 165 09/08/2022   TRIG 53 09/08/2022   HDL 49 09/08/2022   LDLCALC 105 (H) 09/08/2022   ALT 17 11/23/2022   AST 22 11/23/2022   NA 138 12/28/2022   K 4.2 12/28/2022   CL 102 12/28/2022   CREATININE 0.62 12/28/2022   BUN 9 12/28/2022   CO2 29 12/28/2022   TSH 0.571 11/23/2022   INR 1.0 09/08/2022   HGBA1C 6.5 (A) 12/28/2022    Sleep Study 01/24/20: IMPRESSIONS - Severe obstructive sleep apnea occurred during the  diagnostic portion of the study (AHI = 57.4 /hour). An optimal PAP pressure could not be selected for this patient due to ongoing respiratory events. - No significant central sleep apnea occurred during the diagnostic portion of the study (CAI = 0.0/hour). - Mild oxygen desaturation was noted during the diagnostic portion of the study (Min O2 = 86.00%). - No snoring was audible during this  study. - No cardiac abnormalities were noted during this study. - Clinically significant periodic limb movements of sleep did not occur during the study. RECOMMENDATIONS - Trial of auto BiPAP therapy with IPAP max 20cm H2O, EPAP min 6cm H2O and PS 5cm H2O with a Small size [employer] Respironics Full Face Mask Amara View mask and heated humidification.   IMAGES: CT Abd/pelvis 01/25/23: IMPRESSION: 1. No acute intra-abdominal process. 2. 4.5 cm simple appearing cyst in the left ovary. Follow-up pelvic ultrasound in 6-12 months is recommended. 3.  Aortic Atherosclerosis (ICD10-I70.0).  CTA Head/Neck 09/08/22: IMPRESSION: 1. No emergent large vessel occlusion or high-grade stenosis of the intracranial arteries. 2. A 6 x 3 mm aneurysm projecting anteriorly from the supraclinoid left ICA. 3. 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  MRI Brain 09/08/22 IMPRESSION: No acute intracranial process.    EKG: EKG 11/23/22: Afib at 105 bpm, old anterior infarct.   CV: ETT 12/29/22:   No ST deviation was noted.   Study is not diagnostic due to failure to achieve target heart rate. - Per communication with patient by Dr. Nobie Putnam, "Your stress test results were normal. Let's stay on the flecainide until I see you back in clinic."   Echo 12/09/22: IMPRESSIONS   1. Left ventricular ejection fraction, by estimation, is 60 to 65%. The  left ventricle has normal function. The left ventricle has no regional  wall motion abnormalities. There is mild concentric left ventricular  hypertrophy. Left ventricular diastolic  parameters are consistent with Grade I diastolic dysfunction (impaired  relaxation). The average left ventricular global longitudinal strain is  -18.0 %. The global longitudinal strain is normal.   2. Right ventricular systolic function is normal. The right ventricular  size is normal. Tricuspid regurgitation signal  is inadequate for assessing  PA pressure.   3. The mitral valve is grossly normal. No evidence of mitral valve  regurgitation.   4. The aortic valve is tricuspid. Aortic valve regurgitation is not  visualized.   5. The inferior vena cava is normal in size with greater than 50%  respiratory variability, suggesting right atrial pressure of 3 mmHg.    CT Coronary 02/26/21: IMPRESSION: 1. No evidence of CAD, CADRADS = 0. 2. Coronary calcium score of 0. This was 0 percentile for age and sex matched control. 3. Normal coronary origin with right dominance. 4.  Diffuse aortic atherosclerosis in descending aorta.   Long term monitor 02/08/19-02/21/19: Indication: PAFIB Duration:  12d 8h Findings HR  avg 81  Min 60-Max 127  SVT Nonsustained  1 episodes; fastest 100 bpm for 17 beats;   4 triggered events, one assoc with PVC-isolated; the others assoc with sinus PVCs < 1% PACs < 1%    Past Medical History:  Diagnosis Date   Acute gout 07/30/2014   Anemia    Anxiety    Blood transfusion without reported diagnosis    Cigarette nicotine dependence    GERD (gastroesophageal reflux disease)    Gout 2011   Helicobacter pylori gastritis 02/19/2022   Hypertension  Malignant hyperthermia    PAF (paroxysmal atrial fibrillation) (HCC)    PVC (premature ventricular contraction)    Sleep apnea    not on cpap at this time 01-17-20    Past Surgical History:  Procedure Laterality Date   BIOPSY  02/21/2022   Procedure: BIOPSY;  Surgeon: Jeani Hawking, MD;  Location: [facility];  Service: Gastroenterology;;   CHOLECYSTECTOMY N/A 02/23/2022   Procedure: LAPAROSCOPIC CHOLECYSTECTOMY with Lysis of Adhessions;  Surgeon: Quentin Ore, MD;  Location: [facility];  Service: General;  Laterality: N/A;   ESOPHAGOGASTRODUODENOSCOPY (EGD) WITH PROPOFOL N/A 02/21/2022   Procedure: ESOPHAGOGASTRODUODENOSCOPY (EGD) WITH PROPOFOL;  Surgeon: Jeani Hawking, MD;  Location: [facility];  Service:  Gastroenterology;  Laterality: N/A;   NECK SURGERY     TUBAL LIGATION      MEDICATIONS: No current facility-administered medications for this encounter.    aspirin EC 81 MG tablet   Cholecalciferol (VITAMIN D-3) 25 MCG (1000 UT) CAPS   cyanocobalamin (VITAMIN B12) 1000 MCG tablet   famotidine (PEPCID) 20 MG tablet   flecainide (TAMBOCOR) 100 MG tablet   losartan (COZAAR) 25 MG tablet   metoprolol succinate (TOPROL-XL) 25 MG 24 hr tablet   prenatal vitamin w/FE, FA (PRENATAL 1 + 1) 27-1 MG TABS tablet   sertraline (ZOLOFT) 50 MG tablet   spironolactone (ALDACTONE) 25 MG tablet   ticagrelor (BRILINTA) 90 MG TABS tablet   apixaban (ELIQUIS) 5 MG TABS tablet    Shonna Chock, PA-C Surgical Short Stay/Anesthesiology [facility] Phone [phone] [facility] Phone [phone] 02/22/2023 3:54 PM

## 2023-02-23 ENCOUNTER — Encounter (HOSPITAL_COMMUNITY): Payer: Self-pay

## 2023-02-23 ENCOUNTER — Encounter (HOSPITAL_COMMUNITY): Payer: Self-pay | Admitting: Neuroradiology

## 2023-02-23 ENCOUNTER — Ambulatory Visit (HOSPITAL_COMMUNITY)
Admission: RE | Admit: 2023-02-23 | Discharge: 2023-02-23 | Disposition: A | Discharge status: Home / Self Care | Payer: BC Managed Care – PPO | Source: Ambulatory Visit | Attending: Neuroradiology | Admitting: Neuroradiology

## 2023-02-23 ENCOUNTER — Encounter (HOSPITAL_COMMUNITY)
Admission: AD | Disposition: A | Discharge status: Home / Self Care | Payer: Self-pay | Source: Home / Self Care | Attending: Neuroradiology

## 2023-02-23 ENCOUNTER — Inpatient Hospital Stay (HOSPITAL_COMMUNITY): Payer: BC Managed Care – PPO

## 2023-02-23 ENCOUNTER — Inpatient Hospital Stay (HOSPITAL_COMMUNITY)
Admission: AD | Admit: 2023-02-23 | Discharge: 2023-02-24 | DRG: 027 | Disposition: A | Discharge status: Home / Self Care | Payer: BC Managed Care – PPO | Attending: Neuroradiology | Admitting: Neuroradiology

## 2023-02-23 ENCOUNTER — Ambulatory Visit (HOSPITAL_COMMUNITY): Payer: BC Managed Care – PPO | Admitting: Vascular Surgery

## 2023-02-23 DIAGNOSIS — Z87891 Personal history of nicotine dependence: Secondary | ICD-10-CM

## 2023-02-23 DIAGNOSIS — Z8249 Family history of ischemic heart disease and other diseases of the circulatory system: Secondary | ICD-10-CM

## 2023-02-23 DIAGNOSIS — I700 Atherosclerosis of aorta: Secondary | ICD-10-CM | POA: Diagnosis present

## 2023-02-23 DIAGNOSIS — G4733 Obstructive sleep apnea (adult) (pediatric): Secondary | ICD-10-CM | POA: Diagnosis present

## 2023-02-23 DIAGNOSIS — Z9049 Acquired absence of other specified parts of digestive tract: Secondary | ICD-10-CM | POA: Diagnosis not present

## 2023-02-23 DIAGNOSIS — K219 Gastro-esophageal reflux disease without esophagitis: Secondary | ICD-10-CM | POA: Diagnosis present

## 2023-02-23 DIAGNOSIS — I10 Essential (primary) hypertension: Secondary | ICD-10-CM | POA: Diagnosis not present

## 2023-02-23 DIAGNOSIS — Z7902 Long term (current) use of antithrombotics/antiplatelets: Secondary | ICD-10-CM

## 2023-02-23 DIAGNOSIS — I671 Cerebral aneurysm, nonruptured: Principal | ICD-10-CM | POA: Diagnosis present

## 2023-02-23 DIAGNOSIS — Z7901 Long term (current) use of anticoagulants: Secondary | ICD-10-CM

## 2023-02-23 DIAGNOSIS — Z01818 Encounter for other preprocedural examination: Secondary | ICD-10-CM

## 2023-02-23 DIAGNOSIS — F419 Anxiety disorder, unspecified: Secondary | ICD-10-CM | POA: Diagnosis not present

## 2023-02-23 DIAGNOSIS — Z7982 Long term (current) use of aspirin: Secondary | ICD-10-CM

## 2023-02-23 DIAGNOSIS — I480 Paroxysmal atrial fibrillation: Secondary | ICD-10-CM | POA: Diagnosis present

## 2023-02-23 DIAGNOSIS — R946 Abnormal results of thyroid function studies: Secondary | ICD-10-CM | POA: Diagnosis not present

## 2023-02-23 DIAGNOSIS — Z9889 Other specified postprocedural states: Secondary | ICD-10-CM

## 2023-02-23 DIAGNOSIS — I4819 Other persistent atrial fibrillation: Secondary | ICD-10-CM | POA: Diagnosis present

## 2023-02-23 DIAGNOSIS — Z79899 Other long term (current) drug therapy: Secondary | ICD-10-CM

## 2023-02-23 HISTORY — PX: IR ANGIO VERTEBRAL SEL VERTEBRAL BILAT MOD SED: IMG5369

## 2023-02-23 HISTORY — PX: IR TRANSCATH/EMBOLIZ: IMG695

## 2023-02-23 HISTORY — PX: IR CT HEAD LTD: IMG2386

## 2023-02-23 HISTORY — PX: RADIOLOGY WITH ANESTHESIA: SHX6223

## 2023-02-23 HISTORY — PX: IR ANGIOGRAM FOLLOW UP STUDY: IMG697

## 2023-02-23 HISTORY — PX: IR 3D INDEPENDENT WKST: IMG2385

## 2023-02-23 HISTORY — PX: IR ANGIO INTRA EXTRACRAN SEL INTERNAL CAROTID BILAT MOD SED: IMG5363

## 2023-02-23 HISTORY — PX: IR US GUIDE VASC ACCESS RIGHT: IMG2390

## 2023-02-23 LAB — CBC WITH DIFFERENTIAL/PLATELET
Abs Immature Granulocytes: 0.02 10*3/uL (ref 0.00–0.07)
Basophils Absolute: 0.1 10*3/uL (ref 0.0–0.1)
Basophils Relative: 1 %
Eosinophils Absolute: 0.2 10*3/uL (ref 0.0–0.5)
Eosinophils Relative: 3 %
HCT: 40.7 % (ref 36.0–46.0)
Hemoglobin: 13.2 g/dL (ref 12.0–15.0)
Immature Granulocytes: 0 %
Lymphocytes Relative: 37 %
Lymphs Abs: 2.5 10*3/uL (ref 0.7–4.0)
MCH: 29.1 pg (ref 26.0–34.0)
MCHC: 32.4 g/dL (ref 30.0–36.0)
MCV: 89.6 fL (ref 80.0–100.0)
Monocytes Absolute: 0.4 10*3/uL (ref 0.1–1.0)
Monocytes Relative: 6 %
Neutro Abs: 3.7 10*3/uL (ref 1.7–7.7)
Neutrophils Relative %: 53 %
Platelets: 388 10*3/uL (ref 150–400)
RBC: 4.54 MIL/uL (ref 3.87–5.11)
RDW: 13.9 % (ref 11.5–15.5)
WBC: 6.9 10*3/uL (ref 4.0–10.5)
nRBC: 0 % (ref 0.0–0.2)

## 2023-02-23 LAB — BASIC METABOLIC PANEL
Anion gap: 7 (ref 5–15)
BUN: 8 mg/dL (ref 6–20)
CO2: 21 mmol/L — ABNORMAL LOW (ref 22–32)
Calcium: 9.1 mg/dL (ref 8.9–10.3)
Chloride: 106 mmol/L (ref 98–111)
Creatinine, Ser: 0.55 mg/dL (ref 0.44–1.00)
GFR, Estimated: >60 mL/min (ref >60)
Glucose, Bld: 98 mg/dL (ref 70–99)
Potassium: 4.2 mmol/L (ref 3.5–5.1)
Sodium: 134 mmol/L — ABNORMAL LOW (ref 135–145)

## 2023-02-23 LAB — ABO/RH: ABO/RH(D): A POS

## 2023-02-23 LAB — PROTIME-INR
INR: 1 (ref 0.8–1.2)
Prothrombin Time: 13.4 s (ref 11.4–15.2)

## 2023-02-23 LAB — MRSA NEXT GEN BY PCR, NASAL: MRSA by PCR Next Gen: NOT DETECTED

## 2023-02-23 LAB — TYPE AND SCREEN
ABO/RH(D): A POS
Antibody Screen: NEGATIVE

## 2023-02-23 LAB — POCT PREGNANCY, URINE: Preg Test, Ur: NEGATIVE

## 2023-02-23 SURGERY — IR WITH ANESTHESIA
Anesthesia: General

## 2023-02-23 MED ORDER — SODIUM CHLORIDE 0.9 % IV SOLN: INTRAVENOUS | Status: DC

## 2023-02-23 MED ORDER — ACETAMINOPHEN 10 MG/ML IV SOLN: 1000.0000 mg | Freq: Once | INTRAVENOUS | Status: DC | PRN

## 2023-02-23 MED ORDER — ACETAMINOPHEN 325 MG PO TABS
650.0000 mg | ORAL_TABLET | ORAL | Status: DC | PRN
Start: 2023-02-23 — End: 2023-02-24

## 2023-02-23 MED ORDER — ACETAMINOPHEN 160 MG/5ML PO SOLN: 1000.0000 mg | Freq: Once | ORAL | Status: DC | PRN

## 2023-02-23 MED ORDER — NIMODIPINE 30 MG PO CAPS
0.0000 mg | ORAL_CAPSULE | ORAL | Status: AC
  Administered 2023-02-23: 30 mg via ORAL
  Filled 2023-02-23: qty 2

## 2023-02-23 MED ORDER — ONDANSETRON HCL 4 MG/2ML IJ SOLN
INTRAMUSCULAR | Status: DC | PRN
  Administered 2023-02-23: 4 mg via INTRAVENOUS

## 2023-02-23 MED ORDER — ACETAMINOPHEN 160 MG/5ML PO SOLN
650.0000 mg | ORAL | Status: DC | PRN
  Administered 2023-02-23: 650 mg via ORAL

## 2023-02-23 MED ORDER — LACTATED RINGERS IV SOLN: INTRAVENOUS | Status: DC

## 2023-02-23 MED ORDER — MIDAZOLAM HCL 2 MG/2ML IJ SOLN
INTRAMUSCULAR | Status: AC
  Filled 2023-02-23: qty 2

## 2023-02-23 MED ORDER — PHENYLEPHRINE HCL-NACL 20-0.9 MG/250ML-% IV SOLN
INTRAVENOUS | Status: AC
  Filled 2023-02-23: qty 250

## 2023-02-23 MED ORDER — VERAPAMIL HCL 2.5 MG/ML IV SOLN
INTRAVENOUS | Status: AC | PRN
  Administered 2023-02-23: 5 mg via INTRAVENOUS

## 2023-02-23 MED ORDER — MIDAZOLAM HCL 2 MG/2ML IJ SOLN
INTRAMUSCULAR | Status: DC | PRN
  Administered 2023-02-23: 2 mg via INTRAVENOUS

## 2023-02-23 MED ORDER — FLECAINIDE ACETATE 100 MG PO TABS
100.0000 mg | ORAL_TABLET | Freq: Once | ORAL | Status: AC
  Administered 2023-02-23: 100 mg via ORAL
  Filled 2023-02-23: qty 1

## 2023-02-23 MED ORDER — SODIUM CHLORIDE 0.9 % IV BOLUS
250.0000 mL | INTRAVENOUS | Status: DC | PRN
Start: 2023-02-23 — End: 2023-02-24

## 2023-02-23 MED ORDER — IOHEXOL 300 MG/ML  SOLN
100.0000 mL | Freq: Once | INTRAMUSCULAR | Status: AC | PRN
  Administered 2023-02-23: 41 mL via INTRA_ARTERIAL

## 2023-02-23 MED ORDER — CHLORHEXIDINE GLUCONATE 0.12 % MT SOLN
15.0000 mL | Freq: Once | OROMUCOSAL | Status: AC
  Administered 2023-02-23: 15 mL via OROMUCOSAL

## 2023-02-23 MED ORDER — ASPIRIN 81 MG PO CHEW: 81.0000 mg | CHEWABLE_TABLET | Freq: Every day | ORAL | Status: DC

## 2023-02-23 MED ORDER — CHLORHEXIDINE GLUCONATE CLOTH 2 % EX PADS: 6.0000 | MEDICATED_PAD | Freq: Every day | CUTANEOUS | Status: DC

## 2023-02-23 MED ORDER — FENTANYL CITRATE (PF) 250 MCG/5ML IJ SOLN
INTRAMUSCULAR | Status: DC | PRN
  Administered 2023-02-23 (×2): 50 ug via INTRAVENOUS

## 2023-02-23 MED ORDER — CEFAZOLIN SODIUM-DEXTROSE 2-4 GM/100ML-% IV SOLN
2.0000 g | INTRAVENOUS | Status: AC
  Administered 2023-02-23: 2 g via INTRAVENOUS
  Filled 2023-02-23: qty 100

## 2023-02-23 MED ORDER — IOHEXOL 300 MG/ML  SOLN
150.0000 mL | Freq: Once | INTRAMUSCULAR | Status: AC | PRN
  Administered 2023-02-23: 80 mL via INTRA_ARTERIAL

## 2023-02-23 MED ORDER — ACETAMINOPHEN 500 MG PO TABS: 1000.0000 mg | ORAL_TABLET | Freq: Once | ORAL | Status: DC | PRN

## 2023-02-23 MED ORDER — TICAGRELOR 90 MG PO TABS
90.0000 mg | ORAL_TABLET | Freq: Two times a day (BID) | ORAL | Status: DC
  Administered 2023-02-23 – 2023-02-24 (×2): 90 mg via ORAL
  Filled 2023-02-23 (×2): qty 1

## 2023-02-23 MED ORDER — ROCURONIUM BROMIDE 10 MG/ML (PF) SYRINGE
PREFILLED_SYRINGE | INTRAVENOUS | Status: DC | PRN
  Administered 2023-02-23: 80 mg via INTRAVENOUS

## 2023-02-23 MED ORDER — DEXAMETHASONE SODIUM PHOSPHATE 10 MG/ML IJ SOLN
INTRAMUSCULAR | Status: DC | PRN
  Administered 2023-02-23: 5 mg via INTRAVENOUS

## 2023-02-23 MED ORDER — PROPOFOL 10 MG/ML IV BOLUS
INTRAVENOUS | Status: DC | PRN
  Administered 2023-02-23: 160 mg via INTRAVENOUS

## 2023-02-23 MED ORDER — ACETAMINOPHEN 650 MG RE SUPP: 650.0000 mg | RECTAL | Status: DC | PRN

## 2023-02-23 MED ORDER — FENTANYL CITRATE (PF) 100 MCG/2ML IJ SOLN
INTRAMUSCULAR | Status: AC
  Filled 2023-02-23: qty 2

## 2023-02-23 MED ORDER — ASPIRIN 81 MG PO CHEW
81.0000 mg | CHEWABLE_TABLET | Freq: Every day | ORAL | Status: DC
  Administered 2023-02-24: 81 mg via ORAL
  Filled 2023-02-23: qty 1

## 2023-02-23 MED ORDER — VERAPAMIL HCL 2.5 MG/ML IV SOLN
INTRAVENOUS | Status: AC
  Filled 2023-02-23: qty 2

## 2023-02-23 MED ORDER — ONDANSETRON HCL 4 MG/2ML IJ SOLN: 4.0000 mg | Freq: Four times a day (QID) | INTRAMUSCULAR | Status: DC | PRN

## 2023-02-23 MED ORDER — ORAL CARE MOUTH RINSE: 15.0000 mL | Freq: Once | OROMUCOSAL | Status: AC

## 2023-02-23 MED ORDER — TICAGRELOR 90 MG PO TABS: 90.0000 mg | ORAL_TABLET | Freq: Two times a day (BID) | ORAL | Status: DC

## 2023-02-23 MED ORDER — ACETAMINOPHEN 325 MG PO TABS
650.0000 mg | ORAL_TABLET | ORAL | Status: DC | PRN
Start: 2023-02-23 — End: 2023-02-23

## 2023-02-23 MED ORDER — PROPOFOL 1000 MG/100ML IV EMUL
INTRAVENOUS | Status: AC
  Filled 2023-02-23: qty 100

## 2023-02-23 MED ORDER — CLEVIDIPINE BUTYRATE 0.5 MG/ML IV EMUL: 0.0000 mg/h | INTRAVENOUS | Status: DC

## 2023-02-23 MED ORDER — FENTANYL CITRATE (PF) 100 MCG/2ML IJ SOLN
25.0000 ug | INTRAMUSCULAR | Status: DC | PRN
  Administered 2023-02-23 (×2): 25 ug via INTRAVENOUS

## 2023-02-23 MED ORDER — LIDOCAINE 2% (20 MG/ML) 5 ML SYRINGE
INTRAMUSCULAR | Status: DC | PRN
  Administered 2023-02-23: 100 mg via INTRAVENOUS

## 2023-02-23 MED ORDER — ACETAMINOPHEN 160 MG/5ML PO SOLN
650.0000 mg | ORAL | Status: DC | PRN
Start: 2023-02-23 — End: 2023-02-23
  Filled 2023-02-23: qty 20.3

## 2023-02-23 MED ORDER — HEPARIN SODIUM (PORCINE) 1000 UNIT/ML IJ SOLN
INTRAMUSCULAR | Status: DC | PRN
  Administered 2023-02-23: 5000 [IU] via INTRAVENOUS
  Administered 2023-02-23: 2000 [IU] via INTRAVENOUS

## 2023-02-23 MED ORDER — PROPOFOL 500 MG/50ML IV EMUL
INTRAVENOUS | Status: DC | PRN
Start: 2023-02-23 — End: 2023-02-23
  Administered 2023-02-23: 200 ug/kg/min via INTRAVENOUS

## 2023-02-23 MED ORDER — EPHEDRINE SULFATE-NACL 50-0.9 MG/10ML-% IV SOSY
PREFILLED_SYRINGE | INTRAVENOUS | Status: DC | PRN
  Administered 2023-02-23: 5 mg via INTRAVENOUS

## 2023-02-23 MED ORDER — SUGAMMADEX SODIUM 200 MG/2ML IV SOLN
INTRAVENOUS | Status: DC | PRN
  Administered 2023-02-23: 400 mg via INTRAVENOUS

## 2023-02-23 MED ORDER — PHENYLEPHRINE 80 MCG/ML (10ML) SYRINGE FOR IV PUSH (FOR BLOOD PRESSURE SUPPORT)
PREFILLED_SYRINGE | INTRAVENOUS | Status: DC | PRN
  Administered 2023-02-23 (×3): 80 ug via INTRAVENOUS

## 2023-02-23 MED ORDER — PHENYLEPHRINE HCL-NACL 20-0.9 MG/250ML-% IV SOLN
INTRAVENOUS | Status: DC | PRN
  Administered 2023-02-23: 20 ug/min via INTRAVENOUS

## 2023-02-23 MED ORDER — ORAL CARE MOUTH RINSE: 15.0000 mL | OROMUCOSAL | Status: DC | PRN

## 2023-02-23 NOTE — Procedures (Signed)
INTERVENTIONAL NEURORADIOLOGY BRIEF POSTPROCEDURE NOTE  DIAGNOSTIC CEREBRAL ANGIOGRAM, ENDOVASCULAR ANEURYSM EMBOLIZATION  Attending physician: Baldemar Lenis, MD  Diagnosis: Left ICA aneurysm  Access site: Right common femoral artery.  Access closure: Perclose Prostyle.  Anesthesia: IR sedation: General endotracheal anesthesia.  Medication used: 7000 units of heparin IV.  Complications: None.  Estimated blood loss: Negligible.  Specimen: None.  Findings: There was a 5 mm left ICA cavernous aneurysm and a 6 mm left ICA ophthalmic segment aneurysm. Endovascular embolization performed with a 4.5 x 16 mm Pipeline embolization device placed in the left ICA across the neck of the aneurysm. No thromboembolic or hemorrhagic complication.  The patient tolerated the procedure well without incident or complication and is in stable condition.   PLAN: - Bed rest x 6 hour - SBP 100-160 mmHg - Continue DAPT with ASA + brilinta

## 2023-02-23 NOTE — Transfer of Care (Signed)
Immediate Anesthesia Transfer of Care Note  Patient: Emma Stephens  Procedure(s) Performed: IR TRANSCATH/EMBOLIZ IR US GUIDE VASC ACCESS RIGHT IR 3D INDEPENDENT WKST IR ANGIO VERTEBRAL SEL VERTEBRAL BILAT MOD SED IR ANGIOGRAM FOLLOW UP STUDY IR ANGIO INTRA EXTRACRAN SEL COM CAROTID INNOMINATE UNI R MOD SED IR CT HEAD LTD Aneurysm embolization  Patient Location: PACU  Anesthesia Type:General  Level of Consciousness: drowsy  Airway & Oxygen Therapy: Patient Spontanous Breathing and Patient connected to face mask oxygen  Post-op Assessment: Report given to RN and Post -op Vital signs reviewed and stable  Post vital signs: Reviewed and stable  Last Vitals:  Vitals Value Taken Time  BP    Temp    Pulse    Resp    SpO2      Last Pain: There were no vitals filed for this visit.       Complications: No notable events documented.

## 2023-02-23 NOTE — Sedation Documentation (Addendum)
Stent deployed @ 1050

## 2023-02-23 NOTE — H&P (Signed)
  The note originally documented on this encounter has been moved the the encounter in which it belongs.  

## 2023-02-23 NOTE — Sedation Documentation (Addendum)
Case to be completed under general anesthesia, see anesthesia/CRNA notes for charting.

## 2023-02-23 NOTE — Sedation Documentation (Signed)
ACT 216

## 2023-02-23 NOTE — Sedation Documentation (Signed)
ACT 129

## 2023-02-23 NOTE — Sedation Documentation (Signed)
ACT 193

## 2023-02-23 NOTE — Anesthesia Procedure Notes (Addendum)
Arterial Line Insertion Start/End12/06/2022 9:09 AM, 02/23/2023 9:12 AM Performed by: Val Eagle, MD, Camillia Herter, CRNA, anesthesiologist  Preanesthetic checklist: patient identified, IV checked, site marked, risks and benefits discussed, surgical consent, monitors and equipment checked, pre-op evaluation, timeout performed and anesthesia consent Left, radial was placed Catheter size: 20 G Hand hygiene performed  and Seldinger technique used Allen's test indicative of satisfactory collateral circulation Attempts: 1 Procedure performed without using ultrasound guided technique. Following insertion, dressing applied and Biopatch. Post procedure assessment: normal and unchanged  Patient tolerated the procedure well with no immediate complications.

## 2023-02-23 NOTE — Anesthesia Procedure Notes (Addendum)
Procedure Name: Intubation Date/Time: 02/23/2023 9:18 AM  Performed by: Val Eagle, MDPre-anesthesia Checklist: Patient identified, Emergency Drugs available, Suction available and Patient being monitored Patient Re-evaluated:Patient Re-evaluated prior to induction Oxygen Delivery Method: Circle System Utilized Preoxygenation: Pre-oxygenation with 100% oxygen Induction Type: IV induction Ventilation: Mask ventilation without difficulty Laryngoscope Size: Glidescope and 3 Grade View: Grade II Tube type: Oral Tube size: 7.0 mm Number of attempts: 1 Airway Equipment and Method: Stylet and Oral airway Placement Confirmation: ETT inserted through vocal cords under direct vision, positive ETCO2 and breath sounds checked- equal and bilateral Secured at: 21 cm Tube secured with: Tape Dental Injury: Teeth and Oropharynx as per pre-operative assessment  Comments: TMD <3 FB, Large neck, limited Neck ROM with previous hx of Cervical fusion. Limited view with DVL  2 with Mil 2 and MAC 3. Successful intubation with Video Laryngoscopy.

## 2023-02-23 NOTE — Progress Notes (Signed)
Patient arrived to [facility] ICU with PACU RN with patient belonging bag. Husband at bedside, Cristal Deer, and daughter, Judeth Cornfield. [facility] specific policies explained, and patient password sheet was completed and ensured information was up to date. Patient, and family members, in agreeance with [facility] specific policies and procedures, and questions answered.   Mammie Russian, RN

## 2023-02-23 NOTE — H&P (Signed)
Chief Complaint: Patient was seen in consultation today for left internal carotid artery aneurysm embolization at the request of Dr Calvert Cantor  Referring Physician(s): Dr Calvert Cantor  Supervising Physician: Baldemar Lenis  Patient Status: [facility] - Out-pt  History of Present Illness: Emma Stephens is a 52 y.o. female   FULL Code status pt per Pt  Was seen in consultation with Dr Quay Burow 02/03/23 Hx HTN; OSA; Afib on Eliquis (stopped 11/26) Taking ASA 81 mg and Brilinta 90 mg BID To ED 08/2022 with CP and palpitations Developed L side weakness--- resolved few hours She underwent a CT angiogram of the head and neck at that time for workup of the left-sided weakness episode which show no evidence of vessel occlusion or significant stenosis. However, a 6 x 3 mm left ICA ophthalmic segment aneurysm was incidentally discovered. IMPRESSION: 1. No emergent large vessel occlusion or high-grade stenosis of the intracranial arteries. 2. A 6 x 3 mm aneurysm projecting anteriorly from the supraclinoid left ICA. 3. 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  Emma Stephens is a 52 year old female with incidental left ICA aneurysm in the setting of anticoagulation with Eliquis for paroxysmal atrial fibrillation. I personally reviewed her CT angiogram with the patient and her family. Her ICA aneurysm has an irregular configuration and arises at or near the expected origin of the left ophthalmic artery. We discussed signs and symptoms of a subarachnoid hemorrhage and they were instructed to call 911 if there is suspicion for aneurysm rupture. Given her atrial fibrillation, we also reviewed signs and symptoms of a ischemic stroke. I recommended a diagnostic cerebral angiogram to better evaluate size and shape of the aneurysm, as well as its relationship with the parent artery. I explained that aneurysm in this location are  usually, but not always, treated with a flow diverter stent which requires anti-platelet therapy. Options of open neuro surgical clipping and medical management were also discussed. Patient is very fearful to undergo a diagnostic angiogram under moderate sedation due to anxiety. She would prefer to proceed with a diagnostic cerebral angiogram under general anesthesia with intention to treat. I explained that in this case we will need to decide specific treatment methods, such as coiling, flow diverter or intra saccular device at the time of the procedure, after obtaining angiographic images. She understands and agree with the plan. She is expected to undergo ablation for her atrial fibrillation in the near future which will require continued anticoagulation for at least 3 months. This is planned to happen after she undergoes brain aneurysm treatment. We can proceed with transitioning from Eliquis to dual anti-platelet therapy with aspirin and Brilinta 7 days prior to intervention and transitioning to aspirin plus Eliquis after intervention. This has been discussed the with Dr. Willaim Bane before via epic message. We will seek insurance authorization to schedule her intervention under general anesthesia.   Scheduled now for Cerebral arteriogram with possible embolization of L ICA ophthalmic segment artery aneurysm  Past Medical History:  Diagnosis Date   Acute gout 07/30/2014   Anemia    Anxiety    Blood transfusion without reported diagnosis    Cigarette nicotine dependence    GERD (gastroesophageal reflux disease)    Gout 2011   Helicobacter pylori gastritis 02/19/2022   Hypertension    Malignant hyperthermia    denied known history of this 02/21/23; sevoflurane used 02/23/22   PAF (paroxysmal atrial fibrillation) (HCC)    PVC (premature  ventricular contraction)    Sleep apnea    not on cpap at this time 01-17-20    Past Surgical History:  Procedure Laterality Date   BIOPSY  02/21/2022   Procedure:  BIOPSY;  Surgeon: Jeani Hawking, MD;  Location: [facility];  Service: Gastroenterology;;   CHOLECYSTECTOMY N/A 02/23/2022   Procedure: LAPAROSCOPIC CHOLECYSTECTOMY with Lysis of Adhessions;  Surgeon: Quentin Ore, MD;  Location: [facility];  Service: General;  Laterality: N/A;   ESOPHAGOGASTRODUODENOSCOPY (EGD) WITH PROPOFOL N/A 02/21/2022   Procedure: ESOPHAGOGASTRODUODENOSCOPY (EGD) WITH PROPOFOL;  Surgeon: Jeani Hawking, MD;  Location: [facility];  Service: Gastroenterology;  Laterality: N/A;   NECK SURGERY     TUBAL LIGATION      Allergies: Lisinopril, Bupropion, and Other  Medications: Prior to Admission medications   Medication Sig Start Date End Date Taking? Authorizing Provider  apixaban (ELIQUIS) 5 MG TABS tablet Take 1 tablet (5 mg total) by mouth 2 (two) times daily. Patient not taking: Reported on 02/16/2023 09/10/22   Anne Ng, NP  aspirin EC 81 MG tablet Take 81 mg by mouth daily. Swallow whole.    [provider]  Cholecalciferol (VITAMIN D-3) 25 MCG (1000 UT) CAPS Take 1,000 Units by mouth daily.    [provider]  cyanocobalamin (VITAMIN B12) 1000 MCG tablet Take 1 tablet (1,000 mcg total) by mouth daily. 02/24/22   Lorin Glass, MD  famotidine (PEPCID) 20 MG tablet Take 1 tablet (20 mg total) by mouth daily. 01/13/23   Nche, Bonna Gains, NP  flecainide (TAMBOCOR) 100 MG tablet Take 1 tablet (100 mg total) by mouth 2 (two) times daily. 12/20/22   Nobie Putnam, MD  losartan (COZAAR) 25 MG tablet TAKE 1 TABLET (25 MG TOTAL) BY MOUTH IN THE MORNING 12/31/22   Nche, Bonna Gains, NP  metoprolol succinate (TOPROL-XL) 25 MG 24 hr tablet TAKE 1 TABLET (25 MG TOTAL) BY MOUTH DAILY. 07/26/22   Nche, Bonna Gains, NP  prenatal vitamin w/FE, FA (PRENATAL 1 + 1) 27-1 MG TABS tablet Take 1 tablet by mouth daily at 12 noon.    [provider]  sertraline (ZOLOFT) 50 MG tablet TAKE 1 TABLET BY MOUTH EVERY DAY 12/31/22   Nche, Bonna Gains,  NP  spironolactone (ALDACTONE) 25 MG tablet TAKE 1 TABLET (25 MG TOTAL) BY MOUTH DAILY. 01/28/23   Nche, Bonna Gains, NP  ticagrelor (BRILINTA) 90 MG TABS tablet Take 1 tablet (90 mg total) by mouth 2 (two) times daily. 02/16/23   Mickie Kay, NP     Family History  Problem Relation Age of Onset   Diabetes Mother    Hypertension Mother    Cancer Father 47       oral   Diabetes Sister    Hypertension Sister    Diabetes Brother    Hypertension Brother    Breast cancer Paternal Grandmother    Sudden Cardiac Death Neg Hx    Heart attack Neg Hx    Colon cancer Neg Hx    Esophageal cancer Neg Hx    Rectal cancer Neg Hx    Stomach cancer Neg Hx     Social History   Socioeconomic History   Marital status: Married    Spouse name: Not on file   Number of children: Not on file   Years of education: Not on file   Highest education level: Some college, no degree  Occupational History   Occupation: Magazine features editor: [employer]  Tobacco Use  Smoking status: Former    Current packs/day: 0.00    Average packs/day: 0.5 packs/day for 31.0 years (15.5 ttl pk-yrs)    Types: Cigarettes    Start date: 11/03/1990    Quit date: 11/02/2021    Years since quitting: 1.3   Smokeless tobacco: Never  Vaping Use   Vaping status: Some Days  Substance and Sexual Activity   Alcohol use: No   Drug use: No   Sexual activity: Yes    Partners: Male    Birth control/protection: Post-menopausal  Other Topics Concern   Not on file  Social History Narrative   Lives in [place] with family.   Social Determinants of Health   Financial Resource Strain: Low Risk  (08/31/2022)   Overall Financial Resource Strain (CARDIA)    Difficulty of Paying Living Expenses: Not hard at all  Food Insecurity: No Food Insecurity (08/31/2022)   Hunger Vital Sign    Worried About Running Out of Food in the Last Year: Never true    Ran Out of Food in the Last Year: Never true  Transportation Needs: No  Transportation Needs (08/31/2022)   PRAPARE - Administrator, Civil Service (Medical): No    Lack of Transportation (Non-Medical): No  Physical Activity: Unknown (08/31/2022)   Exercise Vital Sign    Days of Exercise per Week: 0 days    Minutes of Exercise per Session: Not on file  Stress: Patient Declined (08/31/2022)   [employer] of Occupational Health - Occupational Stress Questionnaire    Feeling of Stress : Patient declined  Social Connections: Unknown (08/31/2022)   Social Connection and Isolation Panel [NHANES]    Frequency of Communication with Friends and Family: More than three times a week    Frequency of Social Gatherings with Friends and Family: Once a week    Attends Religious Services: More than 4 times per year    Active Member of [employer] or Organizations: Not on file    Attends Banker Meetings: Not on file    Marital Status: Married    Review of Systems: A 12 point ROS discussed and pertinent positives are indicated in the HPI above.  All other systems are negative.  Review of Systems  Constitutional:  Negative for activity change, fatigue and fever.  HENT:  Negative for tinnitus and trouble swallowing.   Eyes:  Negative for visual disturbance.  Respiratory:  Negative for cough and shortness of breath.   Cardiovascular:  Negative for chest pain.  Gastrointestinal:  Negative for abdominal pain and nausea.  Musculoskeletal:  Negative for back pain and gait problem.  Neurological:  Negative for dizziness, tremors, seizures, syncope, facial asymmetry, speech difficulty, light-headedness, numbness and headaches.  Psychiatric/Behavioral:  Negative for behavioral problems and confusion.     Vital Signs: LMP 02/02/2023 (Exact Date)     Physical Exam Vitals reviewed.  HENT:     Mouth/Throat:     Mouth: Mucous membranes are moist.  Eyes:     Extraocular Movements: Extraocular movements intact.  Cardiovascular:     Rate and Rhythm:  Normal rate and regular rhythm.     Heart sounds: Normal heart sounds. No murmur heard. Pulmonary:     Effort: Pulmonary effort is normal.     Breath sounds: Normal breath sounds. No wheezing.  Abdominal:     Palpations: Abdomen is soft.     Tenderness: There is no abdominal tenderness.  Musculoskeletal:        General: Normal range of  motion.     Cervical back: Normal range of motion.     Right lower leg: No edema.     Left lower leg: No edema.  Skin:    General: Skin is warm.  Neurological:     Mental Status: She is alert and oriented to person, place, and time.  Psychiatric:        Mood and Affect: Mood normal.        Behavior: Behavior normal.        Thought Content: Thought content normal.        Judgment: Judgment normal.     Imaging: CT Abdomen Pelvis W Contrast  Result Date: 01/25/2023 CLINICAL DATA:  Intermittent lower abdominal pain for almost a year with alternating constipation and diarrhea. EXAM: CT ABDOMEN AND PELVIS WITH CONTRAST TECHNIQUE: Multidetector CT imaging of the abdomen and pelvis was performed using the standard protocol following bolus administration of intravenous contrast. RADIATION DOSE REDUCTION: This exam was performed according to the departmental dose-optimization program which includes automated exposure control, adjustment of the mA and/or kV according to patient size and/or use of iterative reconstruction technique. CONTRAST:  OMNIPAQUE IOHEXOL 300 MG/ML  SOLN COMPARISON:  CT abdomen pelvis dated February 19, 2022. FINDINGS: Lower chest: No acute abnormality. Hepatobiliary: Unchanged 2.1 cm hemangioma in the medial posterior right hepatic lobe. No new focal liver abnormality. Status post cholecystectomy. No biliary dilatation. Pancreas: Unremarkable. No pancreatic ductal dilatation or surrounding inflammatory changes. Spleen: Normal in size without focal abnormality. Adrenals/Urinary Tract: Adrenal glands are unremarkable. Kidneys are normal,  without renal calculi, solid lesion, or hydronephrosis. Bladder is mostly decompressed. Stomach/Bowel: Stomach is within normal limits. Appendix appears normal. No evidence of bowel wall thickening, distention, or inflammatory changes. Mild left-sided colonic diverticulosis. Vascular/Lymphatic: Aortic atherosclerosis. No enlarged abdominal or pelvic lymph nodes. Reproductive: The uterus and right ovary are unremarkable. 4.5 cm simple appearing cyst in the left ovary. Other: No abdominal wall hernia or abnormality. No abdominopelvic ascites. No pneumoperitoneum. Musculoskeletal: No acute or significant osseous findings. IMPRESSION: 1. No acute intra-abdominal process. 2. 4.5 cm simple appearing cyst in the left ovary. Follow-up pelvic ultrasound in 6-12 months is recommended. 3.  Aortic Atherosclerosis (ICD10-I70.0). Electronically Signed   By: Obie Dredge M.D.   On: 01/25/2023 15:56    Labs:  CBC: Recent Labs    08/31/22 1045 09/08/22 1235 11/23/22 1207 12/28/22 0936  WBC 6.8 8.5 6.8 6.5  HGB 12.6 12.9 13.6 12.5  HCT 38.9 38.5 40.8 39.3  PLT 384.0 434* 434* 382.0    COAGS: Recent Labs    09/08/22 1235  INR 1.0  APTT 27    BMP: Recent Labs    02/24/22 0458 03/24/22 1033 08/31/22 1045 09/08/22 1235 11/23/22 1207 12/28/22 0936  NA 137   < > 138 134* 137 138  K 4.2   < > 4.1 4.0 3.7 4.2  CL 99   < > 100 104 100 102  CO2 28   < > 27 21* 25 29  GLUCOSE 128*   < > 81 138* 195* 107*  BUN 9   < > 11 13 11 9   CALCIUM 9.5   < > 9.9 9.1 9.6 9.5  CREATININE 0.68   < > 0.70 0.75 0.72 0.62  GFRNONAA >60  --   --  >60 >60  --    < > = values in this interval not displayed.    LIVER FUNCTION TESTS: Recent Labs    07/09/22 1610 08/31/22 1045  09/08/22 1235 11/23/22 1207 12/28/22 0936  BILITOT 0.4 0.4 0.5 0.7  --   AST 15 21 23 22   --   ALT 10 22 17 17   --   ALKPHOS 92 75 82 80  --   PROT 7.3 7.7 7.7 7.9  --   ALBUMIN 4.5 4.6 4.2 4.3 4.3    TUMOR MARKERS: No results for  input(s): "AFPTM", "CEA", "CA199", "CHROMGRNA" in the last 8760 hours.  Assessment and Plan:  Scheduled for Cerebral arteriogram with Left internal carotid artery aneurysm embolization Risks and benefits of cerebral angiogram with intervention were discussed with the patient including, but not limited to bleeding, infection, vascular injury, contrast induced renal failure, stroke or even death.  This interventional procedure involves the use of X-rays and because of the nature of the planned procedure, it is possible that we will have prolonged use of X-ray fluoroscopy.  Potential radiation risks to you include (but are not limited to) the following: - A slightly elevated risk for cancer  several years later in life. This risk is typically less than 0.5% percent. This risk is low in comparison to the normal incidence of human cancer, which is 33% for women and 50% for men according to the American Cancer Society. - Radiation induced injury can include skin redness, resembling a rash, tissue breakdown / ulcers and hair loss (which can be temporary or permanent).   The likelihood of either of these occurring depends on the difficulty of the procedure and whether you are sensitive to radiation due to previous procedures, disease, or genetic conditions.   IF your procedure requires a prolonged use of radiation, you will be notified and given written instructions for further action.  It is your responsibility to monitor the irradiated area for the 2 weeks following the procedure and to notify your physician if you are concerned that you have suffered a radiation induced injury.    All of the patient's questions were answered, patient is agreeable to proceed.  Consent signed and in chart.  Thank you for this interesting consult.  I greatly enjoyed meeting Emma Stephens and look forward to participating in their care.  A copy of this report was sent to the requesting provider on this  date.  Electronically Signed: Robet Leu, PA-C 02/23/2023, 7:31 AM   I spent a total of  30 Minutes   in face to face in clinical consultation, greater than 50% of which was counseling/coordinating care for  Cerebral arteriogram with Left internal carotid artery aneurysm embolization

## 2023-02-23 NOTE — Progress Notes (Signed)
Pt seen on floor. Awake and conversant, though still a bit sleepy. No new c/o A-line was removed, feels better. Family at bedside.  BP (!) 150/80   Pulse 66   Temp 98.2 F (36.8 C)   Resp 18   Ht 5\' 1"  (1.549 m)   Wt 175 lb (79.4 kg)   LMP 02/02/2023 (Exact Date)   SpO2 100%   BMI 33.07 kg/m  (R)groin soft, dressing intact and dry. No hematoma Pedal pulses 2+ bilaterally.  Findings: There was a 5 mm left ICA cavernous aneurysm and a 6 mm left ICA ophthalmic segment aneurysm. Endovascular embolization performed with a 4.5 x 16 mm Pipeline embolization device placed in the left ICA across the neck of the aneurysm. No thromboembolic or hemorrhagic complication.   Anticipate discharge tomorrow.  Brayton El PA-C Interventional Radiology 02/23/2023 3:58 PM

## 2023-02-24 ENCOUNTER — Encounter (HOSPITAL_COMMUNITY): Payer: Self-pay | Admitting: Neuroradiology

## 2023-02-24 LAB — BASIC METABOLIC PANEL
Anion gap: 8 (ref 5–15)
BUN: 6 mg/dL (ref 6–20)
CO2: 24 mmol/L (ref 22–32)
Calcium: 8.6 mg/dL — ABNORMAL LOW (ref 8.9–10.3)
Chloride: 102 mmol/L (ref 98–111)
Creatinine, Ser: 0.53 mg/dL (ref 0.44–1.00)
GFR, Estimated: >60 mL/min (ref >60)
Glucose, Bld: 106 mg/dL — ABNORMAL HIGH (ref 70–99)
Potassium: 3.8 mmol/L (ref 3.5–5.1)
Sodium: 134 mmol/L — ABNORMAL LOW (ref 135–145)

## 2023-02-24 LAB — POCT ACTIVATED CLOTTING TIME
Activated Clotting Time: 129 s
Activated Clotting Time: 193 s
Activated Clotting Time: 216 s

## 2023-02-24 LAB — CBC
HCT: 36.3 % (ref 36.0–46.0)
Hemoglobin: 11.9 g/dL — ABNORMAL LOW (ref 12.0–15.0)
MCH: 28.8 pg (ref 26.0–34.0)
MCHC: 32.8 g/dL (ref 30.0–36.0)
MCV: 87.9 fL (ref 80.0–100.0)
Platelets: 371 10*3/uL (ref 150–400)
RBC: 4.13 MIL/uL (ref 3.87–5.11)
RDW: 14 % (ref 11.5–15.5)
WBC: 12.5 10*3/uL — ABNORMAL HIGH (ref 4.0–10.5)
nRBC: 0 % (ref 0.0–0.2)

## 2023-02-24 LAB — TSH: TSH: 0.191 u[IU]/mL — ABNORMAL LOW (ref 0.350–4.500)

## 2023-02-24 MED ORDER — LOSARTAN POTASSIUM 50 MG PO TABS
25.0000 mg | ORAL_TABLET | Freq: Every morning | ORAL | Status: DC
  Administered 2023-02-24: 25 mg via ORAL
  Filled 2023-02-24: qty 1

## 2023-02-24 MED ORDER — SPIRONOLACTONE 25 MG PO TABS
25.0000 mg | ORAL_TABLET | Freq: Every day | ORAL | Status: DC
  Administered 2023-02-24: 25 mg via ORAL
  Filled 2023-02-24: qty 1

## 2023-02-24 MED ORDER — PHENOL 1.4 % MT LIQD
1.0000 | OROMUCOSAL | Status: DC | PRN
  Administered 2023-02-24: 1 via OROMUCOSAL
  Filled 2023-02-24: qty 177

## 2023-02-24 MED ORDER — FLECAINIDE ACETATE 100 MG PO TABS
100.0000 mg | ORAL_TABLET | Freq: Two times a day (BID) | ORAL | Status: DC
  Administered 2023-02-24: 100 mg via ORAL
  Filled 2023-02-24 (×2): qty 1

## 2023-02-24 MED ORDER — SERTRALINE HCL 50 MG PO TABS
50.0000 mg | ORAL_TABLET | Freq: Every day | ORAL | Status: DC
  Administered 2023-02-24: 50 mg via ORAL
  Filled 2023-02-24: qty 1

## 2023-02-24 MED ORDER — METOPROLOL SUCCINATE ER 25 MG PO TB24
25.0000 mg | ORAL_TABLET | Freq: Every day | ORAL | Status: DC
  Administered 2023-02-24: 25 mg via ORAL
  Filled 2023-02-24: qty 1

## 2023-02-24 NOTE — Progress Notes (Signed)
Pt d/c to home by car with family. Assessment stable. D/C instructions reviewed and all questions answered. 

## 2023-02-24 NOTE — Discharge Summary (Addendum)
Patient ID: Emma Stephens MRN: [id number] DOB/AGE: Jun 11, 1970 52 y.o.  Admit date: 02/23/2023 Discharge date: 02/24/2023  Supervising Physician: Baldemar Lenis  Patient Status: [facility] - In-pt  Admission Diagnoses:  Intra-cranial aneurysm  Discharge Diagnoses:  Principal Problem:   Status post coil embolization of cerebral aneurysm   Discharged Condition: good  Hospital Course:  Emma Stephens is a 52 year old female with incidental left ICA aneurysm in the setting of anticoagulation with Eliquis for paroxysmal atrial fibrillation. She was seen in consultation with Dr. Tommie Sams to discuss evaluation and management.  She elected to proceed with diagnostic angiogram with intent to treat.  She presented to [facility] yesterday (12/4) and underwent successful Pipeline stent placement for 2 left ICA aneurysms.  She was admitted for observation overnight.  She has recovered well.  Neuro exam remain intact. No new deficits noted.  She has been able to eat and drink with tolerance.  She has voided.   She has ambulated in her room.  Her husband is at bedside.  They are able to review procedure, findings, and recommendations with Dr. Tommie Sams.  They are made aware of abnormal TSH and patient will plan to bring this to the attention of her PCP.  She is given information regarding follow-up and understands the schedulers will contact her with date and time of appointment.  She is to take Brilinta 90mg  BID, aspirin 81mg  once daily until her follow-up with Cardiology (expected 04/16/23) at which time she may transition to Eliquis and aspirin 81mg  daily per the discretion of her Cardiologist.   Patient voices understanding of all care instructions given.  She is stable for discharge home today.   Discharge Exam: Blood pressure (!) 146/75, pulse 68, temperature 97.8 F (36.6 C), temperature source Oral, resp. rate 20, height 5\' 1"  (1.549 m), weight 175 lb (79.4  kg), last menstrual period 02/02/2023, SpO2 97%. General appearance: alert, cooperative, and no distress Resp: clear to auscultation bilaterally Cardio: regular rate and rhythm, S1, S2 normal, no murmur, click, rub or gallop GI: soft, non-tender; bowel sounds normal; no masses,  no organomegaly Skin: Skin color, texture, turgor normal. No rashes or lesions Incision/Wound: Proceudre site intact.  Mildly tender without evidence of hematoma or pseudoaneurysm.   Disposition: Discharge disposition: 01-Home or Self Care       Discharge Instructions     Diet - low sodium heart healthy   Complete by: As directed    Discharge instructions   Complete by: As directed    Discharge home with the following limitations: -Do not drive for 1 week -Increase activity slowly. No bending, stooping, or lifting >10 lbs for 2 weeks. -Avoid stairs if/when possible for the next one week while groin puncture heals.   -May keep a bandaid/gauze on groin for the next 24hrs, but remove thereafter.  Do not submerge site in water until well healed, otherwise keep clean and dry.  -watch for extension of bruising or active oozing.  If signs of bleeding occur lie flat with leg still, hold pressure, and call 911.   -Keep hydrated, drink plenty of water.  -Continue Brilinta 91 mg BID, aspirin 81mg  daily.  -Stop Eliquis now until your appt with Cardiology in January-- may hold Brilinta and resume Eliquis after this appt per the discretion of Cardiology.  MAINTAIN aspirin 81mg  daily indefinitely.  -Follow-up to be scheduled after discharge, expect in 6 months with [facility]. Schedulers will contact you to arrange.   Increase activity  slowly   Complete by: As directed       Allergies as of 02/24/2023       Reactions   Lisinopril Hives, Swelling, Other (See Comments)   Angioedema and facial swelling   Bupropion Itching, Other (See Comments)   Other    Antibiotics typically cause yeast        Medication List      STOP taking these medications    apixaban 5 MG Tabs tablet Commonly known as: ELIQUIS       TAKE these medications    aspirin EC 81 MG tablet Take 81 mg by mouth daily. Swallow whole.   cyanocobalamin 1000 MCG tablet Commonly known as: VITAMIN B12 Take 1 tablet (1,000 mcg total) by mouth daily.   famotidine 20 MG tablet Commonly known as: PEPCID Take 1 tablet (20 mg total) by mouth daily.   flecainide 100 MG tablet Commonly known as: TAMBOCOR Take 1 tablet (100 mg total) by mouth 2 (two) times daily.   losartan 25 MG tablet Commonly known as: COZAAR TAKE 1 TABLET (25 MG TOTAL) BY MOUTH IN THE MORNING   metoprolol succinate 25 MG 24 hr tablet Commonly known as: TOPROL-XL TAKE 1 TABLET (25 MG TOTAL) BY MOUTH DAILY.   prenatal vitamin w/FE, FA 27-1 MG Tabs tablet Take 1 tablet by mouth daily at 12 noon.   sertraline 50 MG tablet Commonly known as: ZOLOFT TAKE 1 TABLET BY MOUTH EVERY DAY   spironolactone 25 MG tablet Commonly known as: ALDACTONE TAKE 1 TABLET (25 MG TOTAL) BY MOUTH DAILY.   ticagrelor 90 MG Tabs tablet Commonly known as: Brilinta Take 1 tablet (90 mg total) by mouth 2 (two) times daily.   Vitamin D-3 25 MCG (1000 UT) Caps Take 1,000 Units by mouth daily.        Follow-up Information     de Glori Luis, MD Follow up.   Specialties: Radiology, Interventional Radiology Why: Schedulers will contact you with follow-up appointment. Contact information: [address] [place] [address] [phone]                  Electronically Signed: Hoyt Koch, PA 02/24/2023, 11:12 AM   I have spent Greater Than 30 Minutes discharging Delicia Kisamore.

## 2023-02-24 NOTE — TOC CM/SW Note (Signed)
Transition of Care [facility]) - Inpatient Brief Assessment   Patient Details  Name: Pranaya Conerly MRN: [id number] Date of Birth: [birth year]  [facility] CM/SW Contact:    Mearl Latin, LCSW Phone Number: 02/24/2023, 9:49 AM   Clinical Narrative: Patient admitted from home with spouse status post coil embolization of cerebral aneurysm. No current TOC needs identified but please place consult if needs arise.    Transition of Care Asessment: Insurance and Status: Insurance coverage has been reviewed Patient has primary care physician: Yes Home environment has been reviewed: From home Prior level of function:: Independent Prior/Current Home Services: No current home services Social Determinants of Health Reivew: SDOH reviewed no interventions necessary Readmission risk has been reviewed: Yes Transition of care needs: no transition of care needs at this time

## 2023-02-24 NOTE — Anesthesia Postprocedure Evaluation (Signed)
Anesthesia Post Note  Patient: Emma Stephens  Procedure(s) Performed: IR TRANSCATH/EMBOLIZ IR US GUIDE VASC ACCESS RIGHT IR 3D INDEPENDENT WKST IR ANGIO VERTEBRAL SEL VERTEBRAL BILAT MOD SED IR ANGIOGRAM FOLLOW UP STUDY IR CT HEAD LTD IR ANGIO INTRA EXTRACRAN SEL INTERNAL CAROTID BILAT MOD SED Aneurysm embolization     Patient location during evaluation: PACU Anesthesia Type: General Level of consciousness: awake and patient cooperative Pain management: pain level controlled Vital Signs Assessment: post-procedure vital signs reviewed and stable Respiratory status: spontaneous breathing, nonlabored ventilation, respiratory function stable and patient connected to nasal cannula oxygen Cardiovascular status: blood pressure returned to baseline and stable Postop Assessment: no apparent nausea or vomiting Anesthetic complications: no   No notable events documented.           Jordy Hewins

## 2023-03-09 ENCOUNTER — Ambulatory Visit: Payer: BC Managed Care – PPO | Admitting: Dietician

## 2023-03-10 ENCOUNTER — Telehealth: Payer: Self-pay | Admitting: Physician Assistant

## 2023-03-10 NOTE — Telephone Encounter (Signed)
  Patient called c/o headaches which start with a "sparkling kaleidescope" then feel like a "brain freeze".  She states they do improve with Tylenol.  She is s/p endovascular embolization performed with a 4.5 x 16 mm Pipeline embolization device placed in the left ICA across the neck of the aneurysm on 02/23/23 by Dr. Tommie Sams. There was no thromboembolic or hemorrhagic complication.   Chart reviewed.  I have sent a staff message to Dr. Tommie Sams for review. She may need additional imaging or referral to Neurology.  I did instruct her that if the headaches become worse/more persistent/do not improve with OTC medications, to proceed with the ED. She understands.  Jerry Caras Nesta Scaturro PA-C 03/10/2023 12:57 PM

## 2023-03-24 ENCOUNTER — Encounter (HOSPITAL_COMMUNITY): Payer: Self-pay | Admitting: Student

## 2023-03-31 ENCOUNTER — Encounter: Payer: Self-pay | Admitting: Nurse Practitioner

## 2023-03-31 ENCOUNTER — Ambulatory Visit (HOSPITAL_COMMUNITY)
Admission: RE | Admit: 2023-03-31 | Discharge: 2023-03-31 | Disposition: A | Discharge status: Home / Self Care | Payer: BC Managed Care – PPO | Source: Ambulatory Visit | Attending: Neuroradiology | Admitting: Neuroradiology

## 2023-03-31 ENCOUNTER — Ambulatory Visit: Payer: BC Managed Care – PPO | Admitting: Nurse Practitioner

## 2023-03-31 ENCOUNTER — Other Ambulatory Visit (HOSPITAL_COMMUNITY): Payer: Self-pay | Admitting: Neuroradiology

## 2023-03-31 ENCOUNTER — Ambulatory Visit (INDEPENDENT_AMBULATORY_CARE_PROVIDER_SITE_OTHER): Payer: BC Managed Care – PPO | Admitting: Nurse Practitioner

## 2023-03-31 VITALS — BP 130/90 | HR 78 | Temp 97.9°F | Resp 18 | Ht 61.0 in | Wt 180.0 lb

## 2023-03-31 DIAGNOSIS — E785 Hyperlipidemia, unspecified: Secondary | ICD-10-CM | POA: Diagnosis not present

## 2023-03-31 DIAGNOSIS — E1169 Type 2 diabetes mellitus with other specified complication: Secondary | ICD-10-CM

## 2023-03-31 DIAGNOSIS — R519 Headache, unspecified: Secondary | ICD-10-CM | POA: Diagnosis not present

## 2023-03-31 DIAGNOSIS — Z9889 Other specified postprocedural states: Secondary | ICD-10-CM

## 2023-03-31 DIAGNOSIS — I10 Essential (primary) hypertension: Secondary | ICD-10-CM | POA: Diagnosis not present

## 2023-03-31 DIAGNOSIS — I480 Paroxysmal atrial fibrillation: Secondary | ICD-10-CM

## 2023-03-31 DIAGNOSIS — E049 Nontoxic goiter, unspecified: Secondary | ICD-10-CM

## 2023-03-31 HISTORY — DX: Type 2 diabetes mellitus with other specified complication: E11.69

## 2023-03-31 LAB — LIPID PANEL
Cholesterol: 171 mg/dL (ref 0–200)
HDL: 49.8 mg/dL (ref >39.00)
LDL Cholesterol: 101 mg/dL — ABNORMAL HIGH (ref 0–99)
NonHDL: 121.14
Total CHOL/HDL Ratio: 3
Triglycerides: 99 mg/dL (ref 0.0–149.0)
VLDL: 19.8 mg/dL (ref 0.0–40.0)

## 2023-03-31 LAB — MICROALBUMIN / CREATININE URINE RATIO
Creatinine,U: 227.9 mg/dL
Microalb Creat Ratio: 0.5 mg/g (ref 0.0–30.0)
Microalb, Ur: 1.1 mg/dL (ref 0.0–1.9)

## 2023-03-31 LAB — HEMOGLOBIN A1C: Hgb A1c MFr Bld: 6.7 % — ABNORMAL HIGH (ref 4.6–6.5)

## 2023-03-31 MED ORDER — LOSARTAN POTASSIUM 50 MG PO TABS
50.0000 mg | ORAL_TABLET | Freq: Every morning | ORAL | 1 refills | Status: DC
Start: 2023-03-31 — End: 2023-08-09

## 2023-03-31 MED ORDER — METHYLPREDNISOLONE 4 MG PO TBPK: ORAL_TABLET | ORAL | 0 refills | Status: DC

## 2023-03-31 MED ORDER — TRAMADOL-ACETAMINOPHEN 37.5-325 MG PO TABS: 1.0000 | ORAL_TABLET | Freq: Three times a day (TID) | ORAL | 0 refills | Status: AC | PRN

## 2023-03-31 NOTE — Progress Notes (Deleted)
 Electrophysiology Office Note:   Date:  03/31/2023  ID:  Emma Stephens, DOB 03/28/1970, MRN [id number]  Primary Cardiologist: None Electrophysiologist: Fonda Kitty, MD      History of Present Illness:   Emma Stephens is a 53 y.o. female with h/o HTN, PVCs, left internal carotid artery aneurysm and OSA who is seen today for  for Electrophysiology evaluation of atrial fibrillation at the request of Dr. Marcey Sage. Patient was first diagnosed with atrial fibrillation in April 2020.  She was seen in the [facility] in June 2024 and found to be in atrial fibrillation with RVR after presenting with symptoms of chest pressure and palpitations.  She was admitted to the hospital 9/3-9/4 for another episode of atrial fibrillation.  Cardiology team was planning for a cardioversion; however, the patient spontaneously converted to sinus rhythm overnight.  She was discharged home with a short acting Cardizem  and continued on her Eliquis .  She was seen by me in clinic on 12/20/2022.  At that time we made the decision to pursue antiarrhythmic drug therapy while she could be evaluated for her intracranial aneurysm.  She was started on flecainide  100 mg twice daily.  Since her last clinic visit, the patient underwent stenting of her left ICA aneurysms on 02/23/2023.  She has been on Brilinta  and aspirin  since.  Today, she reports***  Review of systems complete and found to be negative unless listed in HPI.   EP Information / Studies Reviewed:    EKG is ordered today. Personal review as below.     11/23/22 EKG:    12/09/2022 Echocardiogram: Normal LV size and function.  Of EF 60 to 65%.  Mild concentric LVH.  Grade 1 diastolic dysfunction. Normal RV size and function. No significant valvular disease mentioned. Normal left and right atrial size.  02/26/21 Coronary CTA: No evidence of CAD.  Normal origin with right dominance.  He is aortic atherosclerosis.  09/08/22 CTA Head and Neck:   IMPRESSION: 1. No emergent large vessel occlusion or high-grade stenosis of the intracranial arteries. 2. A 6 x 3 mm aneurysm projecting anteriorly from the supraclinoid left ICA. 3. 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  ETT 12/29/22:  Normal QRS duration throughout study. Risk Assessment/Calculations:    CHA2DS2-VASc Score = 3   This indicates a 3.2% annual risk of stroke. The patient's score is based upon: CHF History: 0 HTN History: 1 Diabetes History: 0 Stroke History: 0 Vascular Disease History: 1 Age Score: 0 Gender Score: 1   {This patient has a significant risk of stroke if diagnosed with atrial fibrillation.  Please consider VKA or DOAC agent for anticoagulation if the bleeding risk is acceptable.   You can also use the SmartPhrase .[email or web address] for documentation.   :[id number]}          Physical Exam:   VS:  There were no vitals taken for this visit.   Wt Readings from Last 3 Encounters:  03/31/23 180 lb (81.6 kg)  02/23/23 175 lb (79.4 kg)  12/28/22 176 lb 6.4 oz (80 kg)     GEN: Well nourished, well developed in no acute distress CARDIAC: Normal rate and regular rhythm RESPIRATORY: Normal work of breathing ABDOMEN: Non-distended EXTREMITIES:  No edema; No deformity   ASSESSMENT AND PLAN:   Emma Stephens is a 53 y.o. female with h/o HTN, PVCs, left internal carotid artery aneurysm and OSA who is seen today for  for Electrophysiology evaluation of  atrial fibrillation at the request of Dr. Marcey Sage.   #.  Paroxysmal atrial fibrillation, symptomatic: -She is currently only on rate controlling medications, metoprolol  and diltiazem .  Given that she has had multiple episodes requiring ED visits in the past few months, I believe is reasonable to transition to a rhythm control strategy. We will start flecainide  100 mg twice daily and stop diltiazem .  She had a coronary CTA less than 2 years  ago with a calcium  score of 0 and normal coronary arteries.  Recent echocardiogram without wall motion abnormalities. -Discussed treatment options today for AF including antiarrhythmic drug therapy and ablation. Discussed risks, recovery and likelihood of success with each treatment strategy. Risk, benefits, and alternatives to EP study and ablation for afib were discussed. These risks include but are not limited to stroke, bleeding, vascular damage, tamponade, perforation, damage to the esophagus, lungs, phrenic nerve and other structures, pulmonary vein stenosis, worsening renal function, coronary vasospasm and death.  Discussed potential need for repeat ablation procedures and antiarrhythmic drugs after an initial ablation. The patient understands these risk and wishes to proceed.  We will therefore proceed with catheter ablation at the next available time.  Carto, ICE, anesthesia are requested for the procedure.  Will also obtain CT PV protocol prior to the procedure to exclude LAA thrombus and further evaluate atrial anatomy.  We will tentatively plan to stop flecainide  on the day of ablation.  #.  Secondary hypercoagulable state due to atrial fibrillation: -She has a CHA2DS2-VASc score of 3. -She is currently taking Eliquis  with no issue.  She will continue on this medication.  #.  Hypertension:  -At goal today. Emphasized the importance of well-controlled blood pressure and reducing occurrences of atrial fibrillation. -Continue current regimen and close follow-up with primary care physician.  #.  Obstructive sleep apnea: -She is currently planning to be evaluated for the [employer] [facility] implant device.  Emphasized the importance of well-controlled sleep apnea and reducing atrial fibrillation burden.  #.  Left internal carotid artery aneurysm: -Identified on CTA head and neck dated 09/08/2022.  The patient reports that she has not seen a specialist in the outpatient setting for this.  She did see a  neurologist while in the ED, but there is no comment about potential management of the aneurysm.  Given that she will need IV heparin  during the case, I will message this neurologist to discuss if there is an increased risk of intracranial hemorrhage in the setting of the aneurysm.    Signed, Fonda Kitty, MD

## 2023-03-31 NOTE — Progress Notes (Signed)
 Established Patient Visit  Patient: Emma Stephens   DOB: 01-22-1971   53 y.o. Female  MRN: [id number] Visit Date: 03/31/2023  Subjective:    Chief Complaint  Patient presents with   Medical Managment for Chronic issues     3 month follow up diabetes, hypertension and hyperlipidemia . Pt have concerns and questions regarding thyroid ; cologuard is due    HPI She reports enlarged thyroid  per neurosurgeon. No dysphagia, no neck pain, no hoarseness  Acute nonintractable headache Left side headache and left eye pressure x 3days, no improvement with tylenol  1000mg , pain initially associated with left tunnel vision and nausea. These symptoms have resolved, but has persistent pressure in left eye. Last eye exam 01/2023 by [facility]: she was advised to f/up with ophthalmology due to possible possible glaucoma. She has not schedule appointment.  Normal neuro exam today Advised to schedule appointment with ophthalmology ASAP. She has appointment with neurosurgeon today at 2pm Unable to use NSAIDs due to current use of NSAIDs and ASA Ultracet  sent F/up in 63month  DM (diabetes mellitus) (HCC) Repeat hgbA1c, lipid panel, UACr today Requested last eye exam report from [facility] No neuropathy. BP not at goal  Paroxysmal atrial fibrillation (HCC) Rate controlled and NSR Current use of flecanide, ASA, metoprolol , and brilinta   Essential hypertension, benign BP not at goal with metoprolol , losartan  and spironolactone  Admits to non compliance with DASH diet Home BP: 140/80, 138/86 BP Readings from Last 3 Encounters:  03/31/23 (!) 130/90  02/24/23 (!) 146/75  12/28/22 122/76    Increase losartan  dose to 50mg  Repeat CMP today F/up in 63month  Hyperlipidemia associated with type 2 diabetes mellitus (HCC) Repeat lipid panel  Reviewed medical, surgical, and social history today  Medications: Outpatient Medications Prior to Visit  Medication Sig   aspirin  EC 81  MG tablet Take 81 mg by mouth daily. Swallow whole.   Cholecalciferol (VITAMIN D -3) 25 MCG (1000 UT) CAPS Take 1,000 Units by mouth daily.   cyanocobalamin  (VITAMIN B12) 1000 MCG tablet Take 1 tablet (1,000 mcg total) by mouth daily.   famotidine  (PEPCID ) 20 MG tablet Take 1 tablet (20 mg total) by mouth daily.   flecainide  (TAMBOCOR ) 100 MG tablet Take 1 tablet (100 mg total) by mouth 2 (two) times daily.   metoprolol  succinate (TOPROL -XL) 25 MG 24 hr tablet TAKE 1 TABLET (25 MG TOTAL) BY MOUTH DAILY.   prenatal vitamin w/FE, FA (PRENATAL 1 + 1) 27-1 MG TABS tablet Take 1 tablet by mouth daily at 12 noon.   sertraline  (ZOLOFT ) 50 MG tablet TAKE 1 TABLET BY MOUTH EVERY DAY   spironolactone  (ALDACTONE ) 25 MG tablet TAKE 1 TABLET (25 MG TOTAL) BY MOUTH DAILY.   ticagrelor  (BRILINTA ) 90 MG TABS tablet Take 1 tablet (90 mg total) by mouth 2 (two) times daily.   [DISCONTINUED] ELIQUIS  5 MG TABS tablet Take 5 mg by mouth 2 (two) times daily.   [DISCONTINUED] losartan  (COZAAR ) 25 MG tablet TAKE 1 TABLET (25 MG TOTAL) BY MOUTH IN THE MORNING   No facility-administered medications prior to visit.   Reviewed past medical and social history.   ROS per HPI above      Objective:  BP (!) 130/90 (BP Location: Left Arm, Patient Position: Sitting, Cuff Size: Normal)   Pulse 78   Temp 97.9 F (36.6 C) (Temporal)   Resp 18   Ht 5' 1 (1.549 m)   Wt  180 lb (81.6 kg)   SpO2 99%   BMI 34.01 kg/m      Physical Exam Vitals and nursing note reviewed.  HENT:     Nose: Nose normal.  Eyes:     Extraocular Movements: Extraocular movements intact.     Conjunctiva/sclera: Conjunctivae normal.     Pupils: Pupils are equal, round, and reactive to light.  Cardiovascular:     Rate and Rhythm: Normal rate and regular rhythm.     Pulses: Normal pulses.     Heart sounds: Normal heart sounds.  Pulmonary:     Effort: Pulmonary effort is normal.     Breath sounds: Normal breath sounds.  Neurological:      Mental Status: She is alert and oriented to person, place, and time.     Cranial Nerves: No cranial nerve deficit.  Psychiatric:        Mood and Affect: Mood normal.        Behavior: Behavior normal.        Thought Content: Thought content normal.     No results found for any visits on 03/31/23.    Assessment & Plan:    Problem List Items Addressed This Visit     Acute nonintractable headache   Left side headache and left eye pressure x 3days, no improvement with tylenol  1000mg , pain initially associated with left tunnel vision and nausea. These symptoms have resolved, but has persistent pressure in left eye. Last eye exam 01/2023 by [facility]: she was advised to f/up with ophthalmology due to possible possible glaucoma. She has not schedule appointment.  Normal neuro exam today Advised to schedule appointment with ophthalmology ASAP. She has appointment with neurosurgeon today at 2pm Unable to use NSAIDs due to current use of NSAIDs and ASA Ultracet  sent F/up in 44month      Relevant Medications   traMADol -acetaminophen  (ULTRACET ) 37.5-325 MG tablet   DM (diabetes mellitus) (HCC) - Primary   Repeat hgbA1c, lipid panel, UACr today Requested last eye exam report from [facility] No neuropathy. BP not at goal      Relevant Medications   losartan  (COZAAR ) 50 MG tablet   Other Relevant Orders   Hemoglobin A1c   Microalbumin / creatinine urine ratio   Essential hypertension, benign   BP not at goal with metoprolol , losartan  and spironolactone  Admits to non compliance with DASH diet Home BP: 140/80, 138/86 BP Readings from Last 3 Encounters:  03/31/23 (!) 130/90  02/24/23 (!) 146/75  12/28/22 122/76    Increase losartan  dose to 50mg  Repeat CMP today F/up in 44month      Relevant Medications   losartan  (COZAAR ) 50 MG tablet   Hyperlipidemia associated with type 2 diabetes mellitus (HCC)   Repeat lipid panel      Relevant Medications   losartan  (COZAAR ) 50 MG  tablet   Other Relevant Orders   Lipid panel   Paroxysmal atrial fibrillation (HCC)   Rate controlled and NSR Current use of flecanide, ASA, metoprolol , and brilinta       Relevant Medications   losartan  (COZAAR ) 50 MG tablet   Other Visit Diagnoses       Enlarged thyroid        Relevant Orders   Thyroid  Panel With TSH   US  THYROID       Return in about 4 weeks (around 04/28/2023) for headache.     Roselie Mood, NP

## 2023-03-31 NOTE — Assessment & Plan Note (Signed)
 Repeat lipid panel ?

## 2023-03-31 NOTE — Patient Instructions (Signed)
 Go to lab Maintain a low sodium diet Maintain appointment with neurosurgery Schedule appointment with ophthalmology. Go to Ed if headache worsens.

## 2023-03-31 NOTE — Assessment & Plan Note (Signed)
 Rate controlled and NSR Current use of flecanide, ASA, metoprolol, and brilinta

## 2023-03-31 NOTE — Assessment & Plan Note (Addendum)
 Repeat hgbA1c, lipid panel, UACr today Requested last eye exam report from [facility] No neuropathy. BP not at goal

## 2023-03-31 NOTE — Assessment & Plan Note (Signed)
 Left side headache and left eye pressure x 3days, no improvement with tylenol  1000mg , pain initially associated with left tunnel vision and nausea. These symptoms have resolved, but has persistent pressure in left eye. Last eye exam 01/2023 by [facility]: she was advised to f/up with ophthalmology due to possible possible glaucoma. She has not schedule appointment.  Normal neuro exam today Advised to schedule appointment with ophthalmology ASAP. She has appointment with neurosurgeon today at 2pm Unable to use NSAIDs due to current use of NSAIDs and ASA Ultracet  sent F/up in 53month

## 2023-03-31 NOTE — Progress Notes (Signed)
 Referring Physician(s): de Macedo Rodrigues,Hendricks Schwandt  Chief Complaint: The patient is seen in follow up today s/p endovascular embolization of of left ICA aneurysms.  History of present illness:  Emma Stephens is a 53 year old female with past medical history significant for hypertension, NEC, obstructive sleep apnea and paroxysmal atrial fibrillation on Eliquis .  She presented to the emergency on 09/08/2022 with palpitations and chest pain.  At arrival, she developed left-sided weakness with spontaneous resolution after 2-3 hours.  She underwent a CT angiogram of the head and neck at that time for workup of the left-sided weakness episode which show no evidence of vessel occlusion or significant stenosis.  However, a 6 x 3 mm left ICA ophthalmic segment aneurysm was incidentally discovered. She underwent a diagnostic cerebral angiogram on 02/23/2023 that confirmed the presence of a 6.3 mm left ICA aneurysm at the origin of the left ophthalmic artery. Additionally, a 5 mm left ICA aneurysm at the distal cavernous segment was identified. Both aneurysm were treated that same day with placement of a single Pipeline Embolization Device in the left ICA, across the neck of both aneurysms. The procedure was uneventful and she was discharge home neurologically intact.   Today she returns with her daughter complaining of headaches. She says the headaches started approximately a week and a half after her procedure for aneurysm embolization.  The headaches initially resolved with Tylenol  but for the last 3 days it has been persistent.  The headache is more pronounced around the left orbit and is sometimes associated with kaleidoscope vision in both eyes, transient nausea and altered sensation in her hands.  She has seen her primary care earlier today and was prescribed tramadol  p.r.n.SABRA  She denies imbalance, weakness or aphasia.  Past Medical History:  Diagnosis Date   Acute gout 07/30/2014   Anemia     Anxiety    Blood transfusion without reported diagnosis    Cigarette nicotine  dependence    DM (diabetes mellitus) (HCC) 09/10/2022   GERD (gastroesophageal reflux disease)    Gout 2011   Helicobacter pylori gastritis 02/19/2022   Hyperlipidemia associated with type 2 diabetes mellitus (HCC) 03/31/2023   Hypertension    PAF (paroxysmal atrial fibrillation) (HCC)    PVC (premature ventricular contraction)    Sleep apnea    not on cpap at this time 01-17-20    Past Surgical History:  Procedure Laterality Date   BIOPSY  02/21/2022   Procedure: BIOPSY;  Surgeon: Rollin Dover, MD;  Location: [facility];  Service: Gastroenterology;;   CHOLECYSTECTOMY N/A 02/23/2022   Procedure: LAPAROSCOPIC CHOLECYSTECTOMY with Lysis of Adhessions;  Surgeon: Lyndel Deward PARAS, MD;  Location: [facility];  Service: General;  Laterality: N/A;   ESOPHAGOGASTRODUODENOSCOPY (EGD) WITH PROPOFOL  N/A 02/21/2022   Procedure: ESOPHAGOGASTRODUODENOSCOPY (EGD) WITH PROPOFOL ;  Surgeon: Rollin Dover, MD;  Location: [facility];  Service: Gastroenterology;  Laterality: N/A;   IR 3D INDEPENDENT WKST  02/23/2023   IR 3D INDEPENDENT WKST  02/23/2023   IR ANGIO INTRA EXTRACRAN SEL INTERNAL CAROTID BILAT MOD SED  02/23/2023   IR ANGIO VERTEBRAL SEL VERTEBRAL BILAT MOD SED  02/23/2023   IR ANGIOGRAM FOLLOW UP STUDY  02/23/2023   IR CT HEAD LTD  02/23/2023   IR TRANSCATH/EMBOLIZ  02/23/2023   IR US  GUIDE VASC ACCESS RIGHT  02/23/2023   NECK SURGERY     RADIOLOGY WITH ANESTHESIA N/A 02/23/2023   Procedure: Aneurysm embolization;  Surgeon: de Macedo Rodrigues, Brookes Craine, MD;  Location: [facility];  Service: Radiology;  Laterality:  N/A;   TUBAL LIGATION      Allergies: Lisinopril, Bupropion , and Other  Medications: Prior to Admission medications   Medication Sig Start Date End Date Taking? Authorizing Provider  aspirin  EC 81 MG tablet Take 81 mg by mouth daily. Swallow whole.    [provider]  Cholecalciferol (VITAMIN D -3)  25 MCG (1000 UT) CAPS Take 1,000 Units by mouth daily.    [provider]  cyanocobalamin  (VITAMIN B12) 1000 MCG tablet Take 1 tablet (1,000 mcg total) by mouth daily. 02/24/22   Arlice Reichert, MD  famotidine  (PEPCID ) 20 MG tablet Take 1 tablet (20 mg total) by mouth daily. 01/13/23   Nche, Roselie Rockford, NP  flecainide  (TAMBOCOR ) 100 MG tablet Take 1 tablet (100 mg total) by mouth 2 (two) times daily. 12/20/22   Kennyth Chew, MD  losartan  (COZAAR ) 50 MG tablet Take 1 tablet (50 mg total) by mouth in the morning. 03/31/23   Nche, Roselie Rockford, NP  methylPREDNISolone  (MEDROL  DOSEPAK) 4 MG TBPK tablet Willisha Deaton 03/31/23   de Macedo Rodrigues, Mayli Covington, MD  metoprolol  succinate (TOPROL -XL) 25 MG 24 hr tablet TAKE 1 TABLET (25 MG TOTAL) BY MOUTH DAILY. 07/26/22   Nche, Roselie Rockford, NP  prenatal vitamin w/FE, FA (PRENATAL 1 + 1) 27-1 MG TABS tablet Take 1 tablet by mouth daily at 12 noon.    [provider]  sertraline  (ZOLOFT ) 50 MG tablet TAKE 1 TABLET BY MOUTH EVERY DAY 12/31/22   Nche, Roselie Rockford, NP  spironolactone  (ALDACTONE ) 25 MG tablet TAKE 1 TABLET (25 MG TOTAL) BY MOUTH DAILY. 01/28/23   Nche, Roselie Rockford, NP  ticagrelor  (BRILINTA ) 90 MG TABS tablet Take 1 tablet (90 mg total) by mouth 2 (two) times daily. 02/16/23   Covington, Jamie R, NP  traMADol -acetaminophen  (ULTRACET ) 37.5-325 MG tablet Take 1 tablet by mouth every 8 (eight) hours as needed for up to 3 days. 03/31/23 04/03/23  Nche, Roselie Rockford, NP     Family History  Problem Relation Age of Onset   Diabetes Mother    Hypertension Mother    Cancer Father 18       oral   Diabetes Sister    Hypertension Sister    Diabetes Brother    Hypertension Brother    Breast cancer Paternal Grandmother    Sudden Cardiac Death Neg Hx    Heart attack Neg Hx    Colon cancer Neg Hx    Esophageal cancer Neg Hx    Rectal cancer Neg Hx    Stomach cancer Neg Hx     Social History   Socioeconomic History   Marital  status: Married    Spouse name: Not on file   Number of children: Not on file   Years of education: Not on file   Highest education level: Some college, no degree  Occupational History   Occupation: Magazine Features Editor: [employer]  Tobacco Use   Smoking status: Former    Current packs/day: 0.00    Average packs/day: 0.5 packs/day for 31.0 years (15.5 ttl pk-yrs)    Types: Cigarettes    Start date: 11/03/1990    Quit date: 11/02/2021    Years since quitting: 1.4   Smokeless tobacco: Never  Vaping Use   Vaping status: Some Days  Substance and Sexual Activity   Alcohol use: No   Drug use: No   Sexual activity: Yes    Partners: Male    Birth control/protection: Post-menopausal  Other Topics Concern  Not on file  Social History Narrative   Lives in [place] with family.   Social Drivers of Corporate Investment Banker Strain: Low Risk  (08/31/2022)   Overall Financial Resource Strain (CARDIA)    Difficulty of Paying Living Expenses: Not hard at all  Food Insecurity: No Food Insecurity (08/31/2022)   Hunger Vital Sign    Worried About Running Out of Food in the Last Year: Never true    Ran Out of Food in the Last Year: Never true  Transportation Needs: No Transportation Needs (08/31/2022)   PRAPARE - Administrator, Civil Service (Medical): No    Lack of Transportation (Non-Medical): No  Physical Activity: Unknown (08/31/2022)   Exercise Vital Sign    Days of Exercise per Week: 0 days    Minutes of Exercise per Session: Not on file  Stress: Patient Declined (08/31/2022)   [employer] of Occupational Health - Occupational Stress Questionnaire    Feeling of Stress : Patient declined  Social Connections: Unknown (08/31/2022)   Social Connection and Isolation Panel [NHANES]    Frequency of Communication with Friends and Family: More than three times a week    Frequency of Social Gatherings with Friends and Family: Once a week    Attends Religious Services: More than  4 times per year    Active Member of [employer] or Organizations: Not on file    Attends Banker Meetings: Not on file    Marital Status: Married     Vital Signs: There were no vitals taken for this visit.  Physical Exam Constitutional:      Appearance: She is obese.  HENT:     Head: Normocephalic and atraumatic.     Mouth/Throat:     Mouth: Mucous membranes are moist.     Pharynx: Oropharynx is clear.  Eyes:     Extraocular Movements: Extraocular movements intact.     Conjunctiva/sclera: Conjunctivae normal.     Pupils: Pupils are equal, round, and reactive to light.  Neurological:     General: No focal deficit present.     Mental Status: She is alert and oriented to person, place, and time.     Cranial Nerves: Cranial nerves 2-12 are intact.     Sensory: Sensation is intact.     Coordination: Coordination is intact.     Imaging: No results found.  Labs:  CBC: Recent Labs    11/23/22 1207 12/28/22 0936 02/23/23 0741 02/24/23 0455  WBC 6.8 6.5 6.9 12.5*  HGB 13.6 12.5 13.2 11.9*  HCT 40.8 39.3 40.7 36.3  PLT 434* 382.0 388 371    COAGS: Recent Labs    09/08/22 1235 02/23/23 0741  INR 1.0 1.0  APTT 27  --     BMP: Recent Labs    09/08/22 1235 11/23/22 1207 12/28/22 0936 02/23/23 0741 02/24/23 0455  NA 134* 137 138 134* 134*  K 4.0 3.7 4.2 4.2 3.8  CL 104 100 102 106 102  CO2 21* 25 29 21* 24  GLUCOSE 138* 195* 107* 98 106*  BUN 13 11 9 8 6   CALCIUM  9.1 9.6 9.5 9.1 8.6*  CREATININE 0.75 0.72 0.62 0.55 0.53  GFRNONAA >60 >60  --  >60 >60    LIVER FUNCTION TESTS: Recent Labs    07/09/22 0923 08/31/22 1045 09/08/22 1235 11/23/22 1207 12/28/22 0936  BILITOT 0.4 0.4 0.5 0.7  --   AST 15 21 23 22   --   ALT 10 22  17 17  --   ALKPHOS 92 75 82 80  --   PROT 7.3 7.7 7.7 7.9  --   ALBUMIN 4.5 4.6 4.2 4.3 4.3    Assessment:  Augustine Brannick is a 53 year old female who underwent uneventful endovascular embolization of 2 left  ICA aneurysm with placement of a flow diverter on 02/23/2023.  She developed recurrent episodes of headache approximately a week and a half after the intervention.  These can be related to mild perianeurysmal inflammation post flow diversion embolization and usually respond to steroid.  Her neurological exam is intact.  We will prescribed a Medrol  Dosepak in addition to the p.r.n. tramadol  that was prescribed by her primary care.  She was instructed to contact us  in case the headache does not improve after treatment.   She is expected to undergo Afib ablation in the near future. At that time, she should transition from Brilinta  90 mg + ASA 81 mg to Eliquis  + ASA.  Signed: Domenique Quest De Macedo Rodrigues, MD 03/31/2023, 4:32 PM    I spent a total of  25 Minutes in face to face in clinical consultation, greater than 50% of which was counseling/coordinating care for headaches post aneurysm embolization.

## 2023-03-31 NOTE — Assessment & Plan Note (Signed)
 BP not at goal with metoprolol , losartan  and spironolactone  Admits to non compliance with DASH diet Home BP: 140/80, 138/86 BP Readings from Last 3 Encounters:  03/31/23 (!) 130/90  02/24/23 (!) 146/75  12/28/22 122/76    Increase losartan  dose to 50mg  Repeat CMP today F/up in 61month

## 2023-04-01 ENCOUNTER — Ambulatory Visit: Payer: BC Managed Care – PPO | Admitting: Cardiology

## 2023-04-01 ENCOUNTER — Encounter: Payer: Self-pay | Admitting: Nurse Practitioner

## 2023-04-01 LAB — THYROID PANEL WITH TSH
Free Thyroxine Index: 2.3 (ref 1.4–3.8)
T3 Uptake: 27 % (ref 22–35)
T4, Total: 8.6 ug/dL (ref 5.1–11.9)
TSH: 0.53 m[IU]/L

## 2023-04-06 ENCOUNTER — Ambulatory Visit
Admission: RE | Admit: 2023-04-06 | Discharge: 2023-04-06 | Disposition: A | Discharge status: Home / Self Care | Payer: BC Managed Care – PPO | Source: Ambulatory Visit | Attending: Nurse Practitioner | Admitting: Nurse Practitioner

## 2023-04-06 DIAGNOSIS — E049 Nontoxic goiter, unspecified: Secondary | ICD-10-CM

## 2023-04-06 DIAGNOSIS — E042 Nontoxic multinodular goiter: Secondary | ICD-10-CM | POA: Diagnosis not present

## 2023-04-06 DIAGNOSIS — H40013 Open angle with borderline findings, low risk, bilateral: Secondary | ICD-10-CM | POA: Diagnosis not present

## 2023-04-12 ENCOUNTER — Encounter: Payer: Self-pay | Admitting: Nurse Practitioner

## 2023-04-14 ENCOUNTER — Encounter: Payer: Self-pay | Admitting: Nurse Practitioner

## 2023-04-19 ENCOUNTER — Encounter: Payer: Self-pay | Admitting: Nurse Practitioner

## 2023-04-20 ENCOUNTER — Encounter: Payer: Self-pay | Admitting: Gastroenterology

## 2023-04-25 ENCOUNTER — Ambulatory Visit: Payer: BC Managed Care – PPO | Admitting: Cardiology

## 2023-04-26 ENCOUNTER — Other Ambulatory Visit: Payer: Self-pay | Admitting: Nurse Practitioner

## 2023-05-03 ENCOUNTER — Telehealth: Payer: Self-pay | Admitting: Nurse Practitioner

## 2023-05-03 ENCOUNTER — Ambulatory Visit: Payer: BC Managed Care – PPO | Admitting: Nurse Practitioner

## 2023-05-03 NOTE — Telephone Encounter (Signed)
05/03/2023 1st no show, letter sent via [facility]

## 2023-05-04 ENCOUNTER — Ambulatory Visit: Payer: BC Managed Care – PPO | Admitting: Dietician

## 2023-05-11 ENCOUNTER — Other Ambulatory Visit: Payer: Self-pay | Admitting: Nurse Practitioner

## 2023-05-11 DIAGNOSIS — I480 Paroxysmal atrial fibrillation: Secondary | ICD-10-CM

## 2023-05-12 ENCOUNTER — Encounter: Payer: Self-pay | Admitting: Nurse Practitioner

## 2023-05-12 ENCOUNTER — Ambulatory Visit (INDEPENDENT_AMBULATORY_CARE_PROVIDER_SITE_OTHER): Payer: BC Managed Care – PPO | Admitting: Nurse Practitioner

## 2023-05-12 VITALS — BP 136/78 | HR 82 | Temp 98.0°F | Ht 61.0 in | Wt 175.6 lb

## 2023-05-12 DIAGNOSIS — I10 Essential (primary) hypertension: Secondary | ICD-10-CM | POA: Diagnosis not present

## 2023-05-12 DIAGNOSIS — T148XXA Other injury of unspecified body region, initial encounter: Secondary | ICD-10-CM

## 2023-05-12 DIAGNOSIS — E1169 Type 2 diabetes mellitus with other specified complication: Secondary | ICD-10-CM | POA: Diagnosis not present

## 2023-05-12 NOTE — Assessment & Plan Note (Signed)
BP at goal BP Readings from Last 3 Encounters:  05/12/23 136/78  03/31/23 (!) 130/90  02/24/23 (!) 146/75    Maintain med doses

## 2023-05-12 NOTE — Progress Notes (Signed)
Acute Office Visit  Subjective:    Patient ID: Emma Stephens, female    DOB: 06/19/1970, 53 y.o.   MRN: [id number]  Chief Complaint  Patient presents with   Eye Problem    Swelling around left eye with darkness, eye drainage, bruising back of arm and eye   HPI Accompanied by husband and daughter. Mrs. North presents with left eyelid bruising x 1week. Unsure of cause. She denies any eye pain. She has increased tearing and bilateral upper and lower eyelid swelling in AM. Swelling resolves as the day progresses.  DM (diabetes mellitus) (HCC) Hgb A1c at 6.7%: controlled with diet BP at goal LDL not at goal, no statin at this time Normal UACr Normal foot exam F/up in 3months  Essential hypertension, benign BP at goal BP Readings from Last 3 Encounters:  05/12/23 136/78  03/31/23 (!) 130/90  02/24/23 (!) 146/75    Maintain med doses   Outpatient Medications Prior to Visit  Medication Sig   aspirin EC 81 MG tablet Take 81 mg by mouth daily. Swallow whole.   Cholecalciferol (VITAMIN D-3) 25 MCG (1000 UT) CAPS Take 1,000 Units by mouth daily.   cyanocobalamin (VITAMIN B12) 1000 MCG tablet Take 1 tablet (1,000 mcg total) by mouth daily.   famotidine (PEPCID) 20 MG tablet TAKE 1 TABLET BY MOUTH EVERY DAY   flecainide (TAMBOCOR) 100 MG tablet Take 1 tablet (100 mg total) by mouth 2 (two) times daily.   losartan (COZAAR) 50 MG tablet Take 1 tablet (50 mg total) by mouth in the morning.   metoprolol succinate (TOPROL-XL) 25 MG 24 hr tablet TAKE 1 TABLET (25 MG TOTAL) BY MOUTH DAILY.   prenatal vitamin w/FE, FA (PRENATAL 1 + 1) 27-1 MG TABS tablet Take 1 tablet by mouth daily at 12 noon.   sertraline (ZOLOFT) 50 MG tablet TAKE 1 TABLET BY MOUTH EVERY DAY   spironolactone (ALDACTONE) 25 MG tablet TAKE 1 TABLET (25 MG TOTAL) BY MOUTH DAILY.   ticagrelor (BRILINTA) 90 MG TABS tablet Take 1 tablet (90 mg total) by mouth 2 (two) times daily.   [DISCONTINUED] methylPREDNISolone  (MEDROL DOSEPAK) 4 MG TBPK tablet Palestine Gilberg (Patient not taking: Reported on 05/12/2023)   No facility-administered medications prior to visit.   Reviewed past medical and social history.  Review of Systems  Constitutional:  Negative for activity change, appetite change and unexpected weight change.  Respiratory: Negative.    Cardiovascular: Negative.   Gastrointestinal: Negative.   Endocrine: Negative for cold intolerance and heat intolerance.  Genitourinary: Negative.   Musculoskeletal: Negative.   Skin: Negative.   Neurological: Negative.   Hematological: Negative.   Psychiatric/Behavioral:  Negative for behavioral problems, decreased concentration, dysphoric mood, hallucinations, self-injury, sleep disturbance and suicidal ideas. The patient is not nervous/anxious.    Per HPI     Objective:    Physical Exam Vitals and nursing note reviewed.  Eyes:     General: Lids are normal. Gaze aligned appropriately.        Right eye: No discharge or hordeolum.        Left eye: No discharge or hordeolum.     Extraocular Movements: Extraocular movements intact.     Conjunctiva/sclera: Conjunctivae normal.   Cardiovascular:     Rate and Rhythm: Normal rate and regular rhythm.     Pulses: Normal pulses.     Heart sounds: Normal heart sounds.  Pulmonary:     Effort: Pulmonary effort is normal.     Breath sounds:  Normal breath sounds.  Skin:    Findings: Bruising present. Rash is not nodular.     Comments: Healing bruise on left medial upper eyelid only  Neurological:     Mental Status: She is alert and oriented to person, place, and time.    BP 136/78 (BP Location: Left Arm, Patient Position: Sitting, Cuff Size: Normal)   Pulse 82   Temp 98 F (36.7 C)   Ht 5\' 1"  (1.549 m)   Wt 175 lb 9.6 oz (79.7 kg)   SpO2 99%   BMI 33.18 kg/m    No results found for any visits on 05/12/23.     Assessment & Plan:   Problem List Items Addressed This Visit     DM (diabetes  mellitus) (HCC)   Essential hypertension, benign - Primary   Other Visit Diagnoses       Bruising         Advised to schedule appointment with GI and breast center. Advised to use refresh or systant eye lubricant Advised to also use cool compress on new bruises. Provided guidance on red flags when using a blood thinner.  No orders of the defined types were placed in this encounter.  Return in about 3 months (around 08/09/2023) for HTN, DM, hyperlipidemia (fasting).    Alysia Penna, NP

## 2023-05-12 NOTE — Assessment & Plan Note (Signed)
Hgb A1c at 6.7%: controlled with diet BP at goal LDL not at goal, no statin at this time Normal UACr Normal foot exam F/up in 3months

## 2023-05-12 NOTE — Patient Instructions (Addendum)
Schedule appointment with GI- [phone] and [facility] [phone]  Use systane eye lubricant for dry eye. Ok to use warm compress in AM to help with eyelid swelling.  Contusion A contusion is a deep bruise. Contusions are the result of a blunt injury to tissues and muscle fibers under the skin. The injury causes bleeding under the skin. The skin over the contusion may turn blue, purple, or yellow. Minor injuries will give you a painless contusion, but more severe injuries cause contusions that can stay painful and swollen for a few weeks. Follow these instructions at home: Pay attention to any changes in your symptoms. Let your health care provider know about them. Take these actions to relieve your pain. Managing pain, stiffness, and swelling  Use resting, icing, applying pressure (compression), and raising (elevating) the injured area. This is often called the RICE method. Rest the injured area. Return to your normal activities as told by your health care provider. Ask your health care provider what activities are safe for you. If directed, put ice on the injured area. To do this: Put ice in a plastic bag. Place a towel between your skin and the bag. Leave the ice on for 20 minutes, 2-3 times a day. If your skin turns bright red, remove the ice right away to prevent skin damage. The risk of skin damage is higher if you cannot feel pain, heat, or cold. If directed, apply light compression to the injured area using an elastic bandage. Make sure the bandage is not wrapped too tightly. Remove and reapply the bandage as directed by your health care provider. If possible, elevate the injured area above the level of your heart while you are sitting or lying down. General instructions Take over-the-counter and prescription medicines only as told by your health care provider. Keep all follow-up visits. Your health care provider may want to see how your contusion is healing with  treatment. Contact a health care provider if: Your symptoms do not improve after several days of treatment. Your symptoms get worse. You have difficulty moving the injured area. Get help right away if: You have severe pain. You have numbness in a hand or foot. Your hand or foot turns pale or cold. This information is not intended to replace advice given to you by your health care provider. Make sure you discuss any questions you have with your health care provider. Document Revised: 08/24/2021 Document Reviewed: 08/24/2021 Elsevier Patient Education  2024 [employer].

## 2023-05-13 ENCOUNTER — Other Ambulatory Visit: Payer: Self-pay | Admitting: Nurse Practitioner

## 2023-05-13 DIAGNOSIS — Z1231 Encounter for screening mammogram for malignant neoplasm of breast: Secondary | ICD-10-CM

## 2023-05-19 NOTE — Progress Notes (Signed)
 Electrophysiology Office Note:   Date:  05/21/2023  ID:  Emma Stephens, DOB Aug 27, 1970, MRN [id number]  Primary Cardiologist: None Electrophysiologist: Nobie Putnam, MD      History of Present Illness:   Emma Stephens is a 53 y.o. female with h/o HTN, PVCs, left internal carotid artery aneurysm and OSA who is seen today for follow up Electrophysiology evaluation.  Patient was first diagnosed with atrial fibrillation in April 2020.  She was was seen in the [facility] in June 2024 and found to be in atrial fibrillation with RVR after presenting with symptoms of chest pressure and palpitations.  More recently, she was admitted to the hospital 9/3-9/4 for another episode of atrial fibrillation.  Cardiology team was planning for a cardioversion; however, the patient spontaneously converted to sinus rhythm overnight. She was discharged home with a short acting Cardizem and continued on her Eliquis.  She was seen by me in clinic on 12/20/22. At that time, patient was in favor of catheter ablation but warranted evaluation of an intracranial aneurysm so we pursued AAD therapy with flecainide as a bridge. She underwent stenting of her intracranial aneurysm on 02/23/23.   Discussed the use of AI scribe software for clinical note transcription with the patient, who gave verbal consent to proceed.  History of Present Illness   The patient, with a history of atrial fibrillation and recently repaired cerebral aneurysm, presents for follow-up. She reports that the aneurysm repair went smoothly, with only minor post-operative headaches that have since resolved. However, she notes persistent morning swelling in one eye. She has not had a follow-up scan since the procedure, as the neurologist recommended waiting six months to allow the brain to heal. Regarding her atrial fibrillation, she reports shorter episodes that have not required hospitalization. She is currently on flecainide and metoprolol, which  seem to be managing the condition effectively. However, she expresses a desire to reduce her medication load, particularly the flecainide, if possible. The patient also reports persistent fatigue, which she wonders might be a side effect of her current medications. She has a known diagnosis of sleep apnea, but is not currently using a CPAP machine due to insurance issues. She expresses a desire to address this issue next and would like referral.      Review of systems complete and found to be negative unless listed in HPI.   EP Information / Studies Reviewed:    EKG is ordered today. Personal review as below.  EKG Interpretation Date/Time:  Friday May 20 2023 08:50:29 EST Ventricular Rate:  80 PR Interval:  186 QRS Duration:  74 QT Interval:  396 QTC Calculation: 456 R Axis:   43  Text Interpretation: Normal sinus rhythm Right atrial enlargement Septal infarct (cited on or before 20-May-2023) When compared with ECG of 13-Sep-2022 09:14, No significant change was found Confirmed by Nobie Putnam [phone]) on 05/21/2023 3:08:47 PM  11/23/22 EKG:    12/09/2022 Echocardiogram: Normal LV size and function.  Of EF 60 to 65%.  Mild concentric LVH.  Grade 1 diastolic dysfunction. Normal RV size and function. No significant valvular disease mentioned. Normal left and right atrial size.  02/26/21 Coronary CTA: No evidence of CAD.  Normal origin with right dominance.  He is aortic atherosclerosis.  09/08/22 CTA Head and Neck:  IMPRESSION: 1. No emergent large vessel occlusion or high-grade stenosis of the intracranial arteries. 2. A 6 x 3 mm aneurysm projecting anteriorly from the supraclinoid left ICA. 3. 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  Risk Assessment/Calculations:    CHA2DS2-VASc Score = 3   This indicates a 3.2% annual risk of stroke. The patient's score is based upon: CHF History: 0 HTN History:  1 Diabetes History: 0 Stroke History: 0 Vascular Disease History: 1 Age Score: 0 Gender Score: 1         Physical Exam:   VS:  BP (!) 144/78   Pulse 80   Ht 5\' 1"  (1.549 m)   Wt 181 lb (82.1 kg)   BMI 34.20 kg/m    Wt Readings from Last 3 Encounters:  05/20/23 181 lb (82.1 kg)  05/12/23 175 lb 9.6 oz (79.7 kg)  03/31/23 180 lb (81.6 kg)     GEN: Well nourished, well developed in no acute distress CARDIAC: Normal rate and regular rhythm RESPIRATORY: Normal work of breathing ABDOMEN: Non-distended EXTREMITIES:  No edema; No deformity   ASSESSMENT AND PLAN:   Emma Stephens is a 53 y.o. female with h/o HTN, PVCs, left internal carotid artery aneurysm s/p stenting 02/23/23 and OSA who is seen today for follow up Electrophysiology evaluation for atrial fibrillation.  #.  Paroxysmal atrial fibrillation, symptomatic: -Discussed treatment options today for AF including antiarrhythmic drug therapy (continuing flecainide) and ablation. Discussed risks, recovery and likelihood of success with each treatment strategy. Risk, benefits, and alternatives to EP study and ablation for afib were discussed. These risks include but are not limited to stroke, bleeding, vascular damage, tamponade, perforation, damage to the esophagus, lungs, phrenic nerve and other structures, pulmonary vein stenosis, worsening renal function, coronary vasospasm and death.  Discussed potential need for repeat ablation procedures and antiarrhythmic drugs after an initial ablation. The patient understands these risk and wishes to proceed.  We will therefore proceed with catheter ablation at the next available time.  Carto, ICE, anesthesia are requested for the procedure.  Will also obtain CT PV protocol prior to the procedure to exclude LAA thrombus and further evaluate atrial anatomy. -Continue flecainide and metoprolol until ablation. We will stop flecainide on day of ablation.  #.  Secondary hypercoagulable state due  to atrial fibrillation: -She has a CHA2DS2-VASc score of 3. -We will stop ticagrelor and start Eliquis. Continue aspirin per interventional neurology.  #. High risk medication use: Currently on flecainide 100mg  BID.  - ECG today with stable/normal PR and QRS. - BMP ordered.  #. Hypertension -Above goal today.  Recommend checking blood pressures 1-2 times per week at home and recording the values.  Recommend bringing these recordings to the primary care physician.  #.  Obstructive sleep apnea: -Refer back to Dr. Mayford Knife. Interested in starting CPAP.  #.  Left internal carotid artery aneurysm: S/p stenting on 02/23/23. -I have previously discussed with Dr. Tommie Sams and per last notes, "She is to take Brilinta 90mg  BID, aspirin 81mg  once daily until her follow-up with Cardiology (expected 04/16/23) at which time she may transition to Eliquis and aspirin 81mg  daily per the discretion of her Cardiologist." -We will stop ticagrelor and restart Eliquis today, continuing aspirin.  Signed, Nobie Putnam, MD

## 2023-05-20 ENCOUNTER — Ambulatory Visit: Payer: BC Managed Care – PPO | Attending: Cardiology | Admitting: Cardiology

## 2023-05-20 ENCOUNTER — Other Ambulatory Visit: Payer: Self-pay

## 2023-05-20 ENCOUNTER — Encounter: Payer: Self-pay | Admitting: Cardiology

## 2023-05-20 VITALS — BP 144/78 | HR 80 | Ht 61.0 in | Wt 181.0 lb

## 2023-05-20 DIAGNOSIS — D6869 Other thrombophilia: Secondary | ICD-10-CM | POA: Diagnosis not present

## 2023-05-20 DIAGNOSIS — I480 Paroxysmal atrial fibrillation: Secondary | ICD-10-CM

## 2023-05-20 DIAGNOSIS — G4733 Obstructive sleep apnea (adult) (pediatric): Secondary | ICD-10-CM | POA: Diagnosis not present

## 2023-05-20 DIAGNOSIS — I10 Essential (primary) hypertension: Secondary | ICD-10-CM | POA: Diagnosis not present

## 2023-05-20 DIAGNOSIS — I671 Cerebral aneurysm, nonruptured: Secondary | ICD-10-CM

## 2023-05-20 DIAGNOSIS — Z79899 Other long term (current) drug therapy: Secondary | ICD-10-CM

## 2023-05-20 MED ORDER — APIXABAN 5 MG PO TABS: 5.0000 mg | ORAL_TABLET | Freq: Two times a day (BID) | ORAL | 3 refills | Status: AC

## 2023-05-20 NOTE — Patient Instructions (Addendum)
 Medication Instructions:  Your physician has recommended you make the following change in your medication:  1) STOP taking Brilinta (ticagelor) 2) START taking Eliquis 5 mg twice daily  *If you need a refill on your cardiac medications before your next appointment, please call your pharmacy*   Lab Work: BMET and CBC - please go to any [facility] location within 30 days of your procedure to have your labs drawn  Testing/Procedures: Cardiac CT Your physician has requested that you have cardiac CT. Cardiac computed tomography (CT) is a painless test that uses an x-ray machine to take clear, detailed pictures of your heart. For further information please visit [email or web address]  We will call you to schedule your CT scan. It will be done about three weeks prior to your ablation.  Ablation Your physician has recommended that you have an ablation. Catheter ablation is a medical procedure used to treat some cardiac arrhythmias (irregular heartbeats). During catheter ablation, a long, thin, flexible tube is put into a blood vessel in your groin (upper thigh), or neck. This tube is called an ablation catheter. It is then guided to your heart through the blood vessel. Radio frequency waves destroy small areas of heart tissue where abnormal heartbeats may cause an arrhythmia to start. Please see the instruction sheet given to you today.  Follow-Up: At [facility], you and your health needs are our priority.  As part of our continuing mission to provide you with exceptional heart care, we have created designated Provider Care Teams.  These Care Teams include your primary Cardiologist (physician) and Advanced Practice Providers (APPs -  Physician Assistants and Nurse Practitioners) who all work together to provide you with the care you need, when you need it.  Your next appointment:   We will call you to schedule your post-procedure follow up appointments

## 2023-05-26 NOTE — Progress Notes (Signed)
 Chief Complaint: Abdominal pain, altered bowel habits Primary GI Doctor: Dr. Meridee Score  HPI:  Patient is a  53  year old female patient with past medical history of GERD, left internal carotid artery aneurysm ,HTN, PAF, and sleep apnea, who was referred to me by Anne Ng, NP on 01/31/23 for a complaint of lower abdominal pain and changes in bowel habits.    On 02/20/2022 Dr. Haywood Pao on-call was consulted to see patient for epigastric pain and abnormal CT scan.A CT scan of the abdomen was performed and a thickening in was noted in the antrum. There was an associated stranding and some free fluid in the abdomen. She has a history of GERD and one month ago it was very severe. Prior to that time she was taking pantoprazole QAM for mild symptoms. Another medication was prescribed, in addition to the pantoprazole, but her GERD remitted spontaneously.  02/19/22 CT A/P W Contrast IMPRESSION: 1. Thickened, edematous appearance of the gastric antrum with mild adjacent fat stranding and trace free fluid. Findings are suggestive of gastritis and/or peptic ulcer disease. Somewhat asymmetric appearance of the mucosal edema along the lesser curvature Dilyn Osoria be reactive. Underlying neoplasm would be difficult to exclude. Close clinical follow-up as well as a follow-up endoscopy are recommended. 2. Colonic diverticulosis without evidence of acute diverticulitis. 3. Benign 1.9 cm hepatic hemangioma. 4. Aortic atherosclerosis (ICD10-I70.0). 02/21/2022 upper endoscopy with Dr. Elnoria Howard Impression:  - Normal esophagus.  - Gastritis. Biopsied.  - Normal examined duodenum. Path: A.   GASTRIC BIOPSY:  -    Active chronic gastritis with associated Helicobacter pylori  infection (H. pylori immunohistochemical (IHC) stain examined, with  satisfactory controls).  -    Negative for intestinal metaplasia and malignancy.  02/21/22 [place] ABD RUQ IMPRESSION: 1. A positive Murphy's sign was reported. Additionally, a  small amount of pericholecystic fluid was identified. These findings could be secondary to the thickening and edema of the gastric antrum identified on recent CT imaging thought to represent gastritis and/or peptic ulcer disease. No stones or sludge during identified in the gallbladder. No significant wall thickening. If the clinical picture for acute cholecystitis remains ambiguous, recommend a HIDA scan. 2. Hepatic steatosis. 02/22/22 NM Hepato W/EF IMPRESSION: Reduced gallbladder ejection fraction with abdominal pain post ingestion of Ensure, compatible with chronic cholecystitis/biliary dyskinesia. 02/23/22 Lap chole with LOA 01/25/23 CT A/P W Contrast IMPRESSION: 1. No acute intra-abdominal process. 2. 4.5 cm simple appearing cyst in the left ovary. Follow-up pelvic ultrasound in 6-12 months is recommended. 3.  Aortic Atherosclerosis (ICD10-I70.0).     Interval History    Patient presents for main complaint of chronic intermittent lower abdominal pain and altered bowel habits, accompanied by her husband. Patient reports having multiple semi formed stools per day. She does have urgency shortly after meals.She reports this has worsened since after her gallbladder was removed. She is afraid to pass gas, due to stool leakage. She reports dark stools for last few years, but she states since gallbladder removal it has odor.History of anemia and taking prenatal multivitamin with iron. She describes the stool as looking like clay. She will have intermittent episodes of constipation which is when she has more abdominal cramping.     Patient also has history of GERD and she is currently taking Pepcid 20 mg po daily. This controls her symptoms.Patient denies dysphagia. Patient denies nausea, vomiting, or weight loss.    Important to note patient is on Eliquis 5mg  BID and baby ASA 81mg  po daily.  On 06/29/23 scheduled for cardiac procedure for afib.Plans to get off of blood thinners after procedure.  She  underwent stenting of her intracranial aneurysm on 02/23/23.   Wt Readings from Last 3 Encounters:  05/31/23 180 lb 3.2 oz (81.7 kg)  05/20/23 181 lb (82.1 kg)  05/12/23 175 lb 9.6 oz (79.7 kg)    Past Medical History:  Diagnosis Date   Acute gout 07/30/2014   Anemia    Anxiety    Blood transfusion without reported diagnosis    Cigarette nicotine dependence    DM (diabetes mellitus) (HCC) 09/10/2022   GERD (gastroesophageal reflux disease)    Gout 2011   Helicobacter pylori gastritis 02/19/2022   Hyperlipidemia associated with type 2 diabetes mellitus (HCC) 03/31/2023   Hypertension    PAF (paroxysmal atrial fibrillation) (HCC)    PVC (premature ventricular contraction)    Sleep apnea    not on cpap at this time 01-17-20    Past Surgical History:  Procedure Laterality Date   BIOPSY  02/21/2022   Procedure: BIOPSY;  Surgeon: Jeani Hawking, MD;  Location: [facility];  Service: Gastroenterology;;   CHOLECYSTECTOMY N/A 02/23/2022   Procedure: LAPAROSCOPIC CHOLECYSTECTOMY with Lysis of Adhessions;  Surgeon: Quentin Ore, MD;  Location: [facility];  Service: General;  Laterality: N/A;   ESOPHAGOGASTRODUODENOSCOPY (EGD) WITH PROPOFOL N/A 02/21/2022   Procedure: ESOPHAGOGASTRODUODENOSCOPY (EGD) WITH PROPOFOL;  Surgeon: Jeani Hawking, MD;  Location: [facility];  Service: Gastroenterology;  Laterality: N/A;   IR 3D INDEPENDENT WKST  02/23/2023   IR 3D INDEPENDENT WKST  02/23/2023   IR ANGIO INTRA EXTRACRAN SEL INTERNAL CAROTID BILAT MOD SED  02/23/2023   IR ANGIO VERTEBRAL SEL VERTEBRAL BILAT MOD SED  02/23/2023   IR ANGIOGRAM FOLLOW UP STUDY  02/23/2023   IR CT HEAD LTD  02/23/2023   IR TRANSCATH/EMBOLIZ  02/23/2023   IR US GUIDE VASC ACCESS RIGHT  02/23/2023   NECK SURGERY     RADIOLOGY WITH ANESTHESIA N/A 02/23/2023   Procedure: Aneurysm embolization;  Surgeon: Baldemar Lenis, MD;  Location: [facility];  Service: Radiology;  Laterality: N/A;   TUBAL LIGATION      Current  Outpatient Medications  Medication Sig Dispense Refill   apixaban (ELIQUIS) 5 MG TABS tablet Take 1 tablet (5 mg total) by mouth 2 (two) times daily. 180 tablet 3   aspirin EC 81 MG tablet Take 81 mg by mouth daily. Swallow whole.     Cholecalciferol (VITAMIN D-3) 25 MCG (1000 UT) CAPS Take 1,000 Units by mouth daily.     cyanocobalamin (VITAMIN B12) 1000 MCG tablet Take 1 tablet (1,000 mcg total) by mouth daily.     famotidine (PEPCID) 20 MG tablet TAKE 1 TABLET BY MOUTH EVERY DAY 30 tablet 2   flecainide (TAMBOCOR) 100 MG tablet Take 1 tablet (100 mg total) by mouth 2 (two) times daily. 180 tablet 3   losartan (COZAAR) 50 MG tablet Take 1 tablet (50 mg total) by mouth in the morning. 90 tablet 1   metoprolol succinate (TOPROL-XL) 25 MG 24 hr tablet TAKE 1 TABLET (25 MG TOTAL) BY MOUTH DAILY. 90 tablet 3   prenatal vitamin w/FE, FA (PRENATAL 1 + 1) 27-1 MG TABS tablet Take 1 tablet by mouth daily at 12 noon.     sertraline (ZOLOFT) 50 MG tablet TAKE 1 TABLET BY MOUTH EVERY DAY 30 tablet 11   spironolactone (ALDACTONE) 25 MG tablet TAKE 1 TABLET (25 MG TOTAL) BY MOUTH DAILY. 30 tablet 5  No current facility-administered medications for this visit.    Allergies as of 05/31/2023 - Review Complete 05/31/2023  Allergen Reaction Noted   Lisinopril Hives, Swelling, and Other (See Comments) 06/24/2014   Bupropion Itching and Other (See Comments) 04/20/2015   Other  02/16/2023    Family History  Problem Relation Age of Onset   Diabetes Mother    Hypertension Mother    Cancer Father 19       oral   Diabetes Sister    Hypertension Sister    Diabetes Brother    Hypertension Brother    Breast cancer Paternal Grandmother    Sudden Cardiac Death Neg Hx    Heart attack Neg Hx    Colon cancer Neg Hx    Esophageal cancer Neg Hx    Rectal cancer Neg Hx    Stomach cancer Neg Hx     Review of Systems:    Constitutional: No weight loss, fever, chills, weakness or fatigue HEENT: Eyes: No  change in vision               Ears, Nose, Throat:  No change in hearing or congestion Skin: No rash or itching Cardiovascular: No chest pain, chest pressure or palpitations   Respiratory: No SOB or cough Gastrointestinal: See HPI and otherwise negative Genitourinary: No dysuria or change in urinary frequency Neurological: No headache, dizziness or syncope Musculoskeletal: No new muscle or joint pain Hematologic: No bleeding or bruising Psychiatric: No history of depression or anxiety   Physical Exam:  Vital signs: BP 124/72   Pulse 75   Ht 5\' 1"  (1.549 m)   Wt 180 lb 3.2 oz (81.7 kg)   BMI 34.05 kg/m   Constitutional: Pleasant female appears to be in NAD, Well developed, Well nourished, alert and cooperative Throat: Oral cavity and pharynx without inflammation, swelling or lesion.  Respiratory: Respirations even and unlabored. Lungs clear to auscultation bilaterally.   No wheezes, crackles, or rhonchi.  Cardiovascular: Normal S1, S2. Regular rate and rhythm. No peripheral edema, cyanosis or pallor.  Gastrointestinal:  Soft, nondistended, nontender. No rebound or guarding. Normal bowel sounds. No appreciable masses or hepatomegaly. Rectal:  Not performed.  Msk:  Symmetrical without gross deformities. Without edema, no deformity or joint abnormality.  Neurologic:  Alert and  oriented x4;  grossly normal neurologically.  Skin:   Dry and intact without significant lesions or rashes. Psychiatric: Oriented to person, place and time. Demonstrates good judgement and reason without abnormal affect or behaviors.  RELEVANT LABS AND IMAGING: CBC    Latest Ref Rng & Units 05/31/2023   10:28 AM 02/24/2023    4:55 AM 02/23/2023    7:41 AM  CBC  WBC 4.0 - 10.5 K/uL 5.8  12.5  6.9   Hemoglobin 12.0 - 15.0 g/dL 04.5  40.9  81.1   Hematocrit 36.0 - 46.0 % 38.9  36.3  40.7   Platelets 150.0 - 400.0 K/uL 425.0  371  388      CMP     Latest Ref Rng & Units 05/31/2023   10:28 AM 02/24/2023     4:55 AM 02/23/2023    7:41 AM  CMP  Glucose 70 - 99 mg/dL 914  782  98   BUN 6 - 23 mg/dL 9  6  8    Creatinine 0.40 - 1.20 mg/dL 9.56  2.13  0.86   Sodium 135 - 145 mEq/L 140  134  134   Potassium 3.5 - 5.1 mEq/L 4.2  3.8  4.2   Chloride 96 - 112 mEq/L 103  102  106   CO2 19 - 32 mEq/L 29  24  21    Calcium 8.4 - 10.5 mg/dL 9.6  8.6  9.1      Lab Results  Component Value Date   TSH 0.53 03/31/2023  05/14/22 H pylori antigen stool negative 12/09/2022 echo-Left ventricular ejection fraction, by estimation, is 60 to 65%.  01/31/2020 colonoscopy with Dr. Meridee Score, recall 3 years Impression:  - Perianal skin tags found on perianal exam.  - Hemorrhoids found on digital rectal exam.  - The examined portion of the ileum was normal.  - Three 3 to 12 mm polyps in the sigmoid colon, removed with a cold snare. Resected and retrieved.  - One 20 mm polyp in the sigmoid colon, removed with mucosal resection. Resected and retrieved. Clips ( MR conditional) were placed.  - Diverticulosis in the recto- sigmoid colon, in the sigmoid colon and in the descending colon.  - Normal mucosa in the entire examined colon otherwise.  - Non- bleeding non- thrombosed external and internal hemorrhoids. Path: 1. Surgical [P], colon, sigmoid, polyp (3) - TUBULAR ADENOMA, NEGATIVE FOR HIGH GRADE DYSPLASIA (X1). HYPERPLASTIC POLYP (X3). 2. Surgical [P], colon, sigmoid, EMR polyp at 21cm - HYPERPLASTIC POLYP. Assessment: Encounter Diagnoses  Name Primary?   Abdominal cramping Yes   Altered bowel habits    Incontinence of feces with fecal urgency    Gastroesophageal reflux disease without esophagitis    History of Helicobacter pylori infection       53 year old female patient that presents with main complaint of altered bowel habits with intermittent abdominal cramping. She was diagnosed and treated for H pylori with her last EGD (12/23). Eradication test done and negative (2/24). She is overdue for colonoscopy,  will schedule once cleared from cardiology. Pending cardioversion next month. The dark stool Tallon Gertz be related to her iron use, but I will recheck CBC today to evaluate for anemia. For the urgency after meals with loose stools we discussed adding bile salt sequestrant for possible bile salt diarrhea, however patient declines due to concerns it will constipate her. We will start with adding OTC fiber to see if it soaks up the looseness. Reevaluate at follow-up.   Plan: -recheck CBC, BMET today, if anemic Koda Routon consider endoscopy as well -Schedule colonoscopy in [facility] with Dr. Meridee Score pending workup with cardiology. Cardioversion scheduled next month. Hx of colon polyps. Three year interval colonoscopy was due Nov 2024.  -We discussed starting cholestyramine, she has concerns it will cause constipation- will hold off for now. -Start Citrucel 1 tsp po daily , titrate to 2 tsp after 2 weeks -If no improvement consider pelvic floor therapy  -follow-up with APP in 6 weeks  Thank you for the courtesy of this consult. Please call me with any questions or concerns.   Nirav Sweda, FNP-C [facility] 05/31/2023, 1:02 PM  Cc: Anne Ng, NP

## 2023-05-31 ENCOUNTER — Encounter: Payer: Self-pay | Admitting: Gastroenterology

## 2023-05-31 ENCOUNTER — Ambulatory Visit (INDEPENDENT_AMBULATORY_CARE_PROVIDER_SITE_OTHER): Payer: BC Managed Care – PPO | Admitting: Gastroenterology

## 2023-05-31 ENCOUNTER — Other Ambulatory Visit (INDEPENDENT_AMBULATORY_CARE_PROVIDER_SITE_OTHER)

## 2023-05-31 VITALS — BP 124/72 | HR 75 | Ht 61.0 in | Wt 180.2 lb

## 2023-05-31 DIAGNOSIS — R152 Fecal urgency: Secondary | ICD-10-CM

## 2023-05-31 DIAGNOSIS — Z8619 Personal history of other infectious and parasitic diseases: Secondary | ICD-10-CM

## 2023-05-31 DIAGNOSIS — R194 Change in bowel habit: Secondary | ICD-10-CM | POA: Diagnosis not present

## 2023-05-31 DIAGNOSIS — R159 Full incontinence of feces: Secondary | ICD-10-CM

## 2023-05-31 DIAGNOSIS — K219 Gastro-esophageal reflux disease without esophagitis: Secondary | ICD-10-CM

## 2023-05-31 DIAGNOSIS — R109 Unspecified abdominal pain: Secondary | ICD-10-CM

## 2023-05-31 LAB — CBC WITH DIFFERENTIAL/PLATELET
Basophils Absolute: 0 10*3/uL (ref 0.0–0.1)
Basophils Relative: 0.8 % (ref 0.0–3.0)
Eosinophils Absolute: 0.2 10*3/uL (ref 0.0–0.7)
Eosinophils Relative: 3.4 % (ref 0.0–5.0)
HCT: 38.9 % (ref 36.0–46.0)
Hemoglobin: 12.6 g/dL (ref 12.0–15.0)
Lymphocytes Relative: 47.9 % — ABNORMAL HIGH (ref 12.0–46.0)
Lymphs Abs: 2.8 10*3/uL (ref 0.7–4.0)
MCHC: 32.5 g/dL (ref 30.0–36.0)
MCV: 89.2 fl (ref 78.0–100.0)
Monocytes Absolute: 0.3 10*3/uL (ref 0.1–1.0)
Monocytes Relative: 5.7 % (ref 3.0–12.0)
Neutro Abs: 2.5 10*3/uL (ref 1.4–7.7)
Neutrophils Relative %: 42.2 % — ABNORMAL LOW (ref 43.0–77.0)
Platelets: 425 10*3/uL — ABNORMAL HIGH (ref 150.0–400.0)
RBC: 4.36 Mil/uL (ref 3.87–5.11)
RDW: 13.9 % (ref 11.5–15.5)
WBC: 5.8 10*3/uL (ref 4.0–10.5)

## 2023-05-31 LAB — BASIC METABOLIC PANEL
BUN: 9 mg/dL (ref 6–23)
CO2: 29 meq/L (ref 19–32)
Calcium: 9.6 mg/dL (ref 8.4–10.5)
Chloride: 103 meq/L (ref 96–112)
Creatinine, Ser: 0.69 mg/dL (ref 0.40–1.20)
GFR: 98.57 mL/min (ref >60.00)
Glucose, Bld: 101 mg/dL — ABNORMAL HIGH (ref 70–99)
Potassium: 4.2 meq/L (ref 3.5–5.1)
Sodium: 140 meq/L (ref 135–145)

## 2023-05-31 NOTE — Patient Instructions (Addendum)
 Start Citrucel 1 tsp po daily x 2 weeks, then increase to 2 tsp po daily. Avoid caffeine, fructose, dairy, and fatty greasy foods.  Your provider has requested that you go to the basement level for lab work before leaving today. Press "B" on the elevator. The lab is located at the first door on the left as you exit the elevator. _______________________________________________________  If your blood pressure at your visit was 140/90 or greater, please contact your primary care physician to follow up on this.  _______________________________________________________  If you are age 54 or older, your body mass index should be between 23-30. Your Body mass index is 34.05 kg/m. If this is out of the aforementioned range listed, please consider follow up with your Primary Care Provider.  If you are age 78 or younger, your body mass index should be between 19-25. Your Body mass index is 34.05 kg/m. If this is out of the aformentioned range listed, please consider follow up with your Primary Care Provider.   ________________________________________________________  The [facility] providers would like to encourage you to use [facility] to communicate with providers for non-urgent requests or questions.  Due to long hold times on the telephone, sending your provider a message by [facility] may be a faster and more efficient way to get a response.  Please allow 48 business hours for a response.  Please remember that this is for non-urgent requests.  _______________________________________________________  Thank you for trusting me with your gastrointestinal care!   Margarite Gouge May, NP

## 2023-05-31 NOTE — Progress Notes (Signed)
 Attending Physician's Attestation   I have reviewed the chart.   I agree with the Advanced Practitioner's note, impression, and recommendations with any updates as below.    Corliss Parish, MD [facility] Advanced Endoscopy Office # [phone]

## 2023-06-01 ENCOUNTER — Ambulatory Visit
Admission: RE | Admit: 2023-06-01 | Discharge: 2023-06-01 | Disposition: A | Discharge status: Home / Self Care | Payer: BC Managed Care – PPO | Source: Ambulatory Visit | Attending: Nurse Practitioner | Admitting: Nurse Practitioner

## 2023-06-01 DIAGNOSIS — Z1231 Encounter for screening mammogram for malignant neoplasm of breast: Secondary | ICD-10-CM | POA: Diagnosis not present

## 2023-06-07 ENCOUNTER — Telehealth (HOSPITAL_COMMUNITY): Payer: Self-pay

## 2023-06-07 NOTE — Telephone Encounter (Signed)
 Spoke with patient to complete one month pre-procedure call.     New medical conditions? No Recent hospitalizations or surgeries? No Started any new medications? No Patient made aware to contact office to inform of any new medications started. Any changes in activities of daily living? No  Pre-procedure testing scheduled: CT on 06/14/23 and lab work ordered. Confirmed patient is taking Eliquis and will continue taking medication before procedure or it may need to be rescheduled.  Confirmed patient is scheduled for Atrial Fibrillation Ablation on Wednesday, April 9 with Dr. Michele Rockers. Instructed patient to arrive at the Main Entrance A at [facility]: [address] [place], [place] [address] and check in at Admitting at 6:30 AM  Advised of plan to go home the same day and will only stay overnight if medically necessary. You MUST have a responsible adult to drive you home and MUST be with you the first 24 hours after you arrive home or your procedure could be cancelled.  Patient inquired about referral to Dr. Mayford Knife for sleep apnea with hopes of getting the [facility] implant. Noted a referral was discussed during office visit on 2/28. Will send message to Dr. Jimmey Ralph and his nurse for follow-up.    Patient verbalized understanding to information provided and is agreeable to proceed with procedure.

## 2023-06-08 ENCOUNTER — Other Ambulatory Visit: Payer: Self-pay

## 2023-06-08 DIAGNOSIS — G4733 Obstructive sleep apnea (adult) (pediatric): Secondary | ICD-10-CM

## 2023-06-14 ENCOUNTER — Ambulatory Visit (HOSPITAL_COMMUNITY)
Admission: RE | Admit: 2023-06-14 | Discharge: 2023-06-14 | Disposition: A | Discharge status: Home / Self Care | Source: Ambulatory Visit | Attending: Cardiology | Admitting: Cardiology

## 2023-06-14 DIAGNOSIS — I480 Paroxysmal atrial fibrillation: Secondary | ICD-10-CM | POA: Diagnosis not present

## 2023-06-14 MED ORDER — IOHEXOL 350 MG/ML SOLN
95.0000 mL | Freq: Once | INTRAVENOUS | Status: AC | PRN
  Administered 2023-06-14: 95 mL via INTRAVENOUS

## 2023-06-22 ENCOUNTER — Other Ambulatory Visit: Payer: Self-pay | Admitting: Nurse Practitioner

## 2023-06-22 ENCOUNTER — Telehealth (HOSPITAL_COMMUNITY): Payer: Self-pay

## 2023-06-22 DIAGNOSIS — I10 Essential (primary) hypertension: Secondary | ICD-10-CM

## 2023-06-22 NOTE — Telephone Encounter (Signed)
 Attempted to reach patient to discuss upcoming procedure, no answer. Left VM for patient to return call.

## 2023-06-23 ENCOUNTER — Ambulatory Visit: Payer: BC Managed Care – PPO | Admitting: Dietician

## 2023-06-23 NOTE — Telephone Encounter (Signed)
 Medication: Metoprolol Succinate (Toprol-XL) 25 mg  Directions: Take 1 tablet by mouth daily  Last given: 07/26/22 Number refills:  Last o/v:  Follow up:  Labs:

## 2023-06-27 NOTE — Telephone Encounter (Signed)
 Call placed to patient to discuss upcoming procedure.   CT: completed.  Labs: completed.   Any recent signs of acute illness or been started on antibiotics? No Any new medications started? NO Any medications to hold? No Any missed doses of blood thinner? No Advised patient to continue taking ANTICOAGULANT: Eliquis (Apixaban) without missing any doses.  Medication instructions:  On the morning of your procedure DO NOT take any medication., including Eliquis or the procedure may be rescheduled. Nothing to eat or drink after midnight prior to your procedure.  Confirmed patient is scheduled for Atrial Fibrillation Ablation on Wednesday, April 9 with Dr. Michele Rockers. Instructed patient to arrive at the Main Entrance A at [facility]: [address] [place], [place] [address] and check in at Admitting at 6:30 AM  Advised of plan to go home the same day and will only stay overnight if medically necessary. You MUST have a responsible adult to drive you home and MUST be with you the first 24 hours after you arrive home or your procedure could be cancelled.  Patient verbalized understanding to all instructions provided and agreed to proceed with procedure.

## 2023-06-28 NOTE — Pre-Procedure Instructions (Signed)
 Instructed patient on the following items: Arrival time 0615 Nothing to eat or drink after midnight No meds AM of procedure Responsible person to drive you home and stay with you for 24 hrs  Have you missed any doses of anti-coagulant Eliquis- takes twice a day, hasn't missed any doses.  Don't take dose morning of procedure.

## 2023-06-29 ENCOUNTER — Ambulatory Visit (HOSPITAL_COMMUNITY): Payer: Self-pay | Admitting: Anesthesiology

## 2023-06-29 ENCOUNTER — Ambulatory Visit (HOSPITAL_COMMUNITY)
Admission: RE | Disposition: A | Discharge status: Home / Self Care | Payer: Self-pay | Source: Home / Self Care | Attending: Cardiology

## 2023-06-29 ENCOUNTER — Encounter (HOSPITAL_COMMUNITY): Payer: Self-pay | Admitting: Cardiology

## 2023-06-29 ENCOUNTER — Ambulatory Visit (HOSPITAL_COMMUNITY)
Admission: RE | Admit: 2023-06-29 | Discharge: 2023-06-29 | Disposition: A | Discharge status: Home / Self Care | Payer: BC Managed Care – PPO | Attending: Cardiology | Admitting: Cardiology

## 2023-06-29 ENCOUNTER — Other Ambulatory Visit: Payer: Self-pay

## 2023-06-29 DIAGNOSIS — D6869 Other thrombophilia: Secondary | ICD-10-CM | POA: Diagnosis not present

## 2023-06-29 DIAGNOSIS — Z7901 Long term (current) use of anticoagulants: Secondary | ICD-10-CM | POA: Insufficient documentation

## 2023-06-29 DIAGNOSIS — I671 Cerebral aneurysm, nonruptured: Secondary | ICD-10-CM | POA: Insufficient documentation

## 2023-06-29 DIAGNOSIS — I480 Paroxysmal atrial fibrillation: Secondary | ICD-10-CM | POA: Insufficient documentation

## 2023-06-29 DIAGNOSIS — K219 Gastro-esophageal reflux disease without esophagitis: Secondary | ICD-10-CM | POA: Diagnosis not present

## 2023-06-29 DIAGNOSIS — I10 Essential (primary) hypertension: Secondary | ICD-10-CM | POA: Insufficient documentation

## 2023-06-29 DIAGNOSIS — G4733 Obstructive sleep apnea (adult) (pediatric): Secondary | ICD-10-CM | POA: Diagnosis not present

## 2023-06-29 DIAGNOSIS — Z79899 Other long term (current) drug therapy: Secondary | ICD-10-CM | POA: Insufficient documentation

## 2023-06-29 HISTORY — PX: ATRIAL FIBRILLATION ABLATION: EP1191

## 2023-06-29 LAB — GLUCOSE, CAPILLARY
Glucose-Capillary: 104 mg/dL — ABNORMAL HIGH (ref 70–99)
Glucose-Capillary: 134 mg/dL — ABNORMAL HIGH (ref 70–99)

## 2023-06-29 LAB — POCT ACTIVATED CLOTTING TIME: Activated Clotting Time: 314 s

## 2023-06-29 SURGERY — ATRIAL FIBRILLATION ABLATION
Anesthesia: General

## 2023-06-29 MED ORDER — HEPARIN (PORCINE) IN NACL 1000-0.9 UT/500ML-% IV SOLN
INTRAVENOUS | Status: DC | PRN
  Administered 2023-06-29 (×3): 500 mL

## 2023-06-29 MED ORDER — MIDAZOLAM HCL 2 MG/2ML IJ SOLN
INTRAMUSCULAR | Status: DC | PRN
  Administered 2023-06-29: 2 mg via INTRAVENOUS

## 2023-06-29 MED ORDER — SUGAMMADEX SODIUM 200 MG/2ML IV SOLN
INTRAVENOUS | Status: DC | PRN
  Administered 2023-06-29: 320 mg via INTRAVENOUS

## 2023-06-29 MED ORDER — ONDANSETRON HCL 4 MG/2ML IJ SOLN: 4.0000 mg | Freq: Four times a day (QID) | INTRAMUSCULAR | Status: DC | PRN

## 2023-06-29 MED ORDER — CEFAZOLIN SODIUM-DEXTROSE 2-4 GM/100ML-% IV SOLN
2.0000 g | Freq: Once | INTRAVENOUS | Status: AC
  Administered 2023-06-29: 2 g via INTRAVENOUS

## 2023-06-29 MED ORDER — ATROPINE SULFATE 1 MG/10ML IJ SOSY
PREFILLED_SYRINGE | INTRAMUSCULAR | Status: DC | PRN
  Administered 2023-06-29: 1 mg via INTRAVENOUS

## 2023-06-29 MED ORDER — DEXAMETHASONE SODIUM PHOSPHATE 10 MG/ML IJ SOLN
INTRAMUSCULAR | Status: DC | PRN
Start: 2023-06-29 — End: 2023-06-29
  Administered 2023-06-29: 10 mg via INTRAVENOUS

## 2023-06-29 MED ORDER — ACETAMINOPHEN 500 MG PO TABS
1000.0000 mg | ORAL_TABLET | Freq: Once | ORAL | Status: AC
  Administered 2023-06-29: 1000 mg via ORAL

## 2023-06-29 MED ORDER — FENTANYL CITRATE (PF) 100 MCG/2ML IJ SOLN
INTRAMUSCULAR | Status: AC
  Filled 2023-06-29: qty 2

## 2023-06-29 MED ORDER — HEPARIN SODIUM (PORCINE) 1000 UNIT/ML IJ SOLN
INTRAMUSCULAR | Status: DC | PRN
Start: 2023-06-29 — End: 2023-06-29
  Administered 2023-06-29: 2000 [IU] via INTRAVENOUS
  Administered 2023-06-29: 13000 [IU] via INTRAVENOUS

## 2023-06-29 MED ORDER — SODIUM CHLORIDE 0.9 % IV SOLN: INTRAVENOUS | Status: DC

## 2023-06-29 MED ORDER — SODIUM CHLORIDE 0.9 % IV SOLN: 250.0000 mL | INTRAVENOUS | Status: DC | PRN

## 2023-06-29 MED ORDER — LIDOCAINE 2% (20 MG/ML) 5 ML SYRINGE
INTRAMUSCULAR | Status: DC | PRN
  Administered 2023-06-29: 60 mg via INTRAVENOUS

## 2023-06-29 MED ORDER — CEFAZOLIN SODIUM-DEXTROSE 2-4 GM/100ML-% IV SOLN
INTRAVENOUS | Status: AC
Start: 2023-06-29 — End: 2023-06-29
  Filled 2023-06-29: qty 100

## 2023-06-29 MED ORDER — ONDANSETRON HCL 4 MG/2ML IJ SOLN
INTRAMUSCULAR | Status: DC | PRN
  Administered 2023-06-29: 4 mg via INTRAVENOUS

## 2023-06-29 MED ORDER — ACETAMINOPHEN 325 MG PO TABS: 650.0000 mg | ORAL_TABLET | ORAL | Status: DC | PRN

## 2023-06-29 MED ORDER — FENTANYL CITRATE (PF) 250 MCG/5ML IJ SOLN
INTRAMUSCULAR | Status: DC | PRN
Start: 2023-06-29 — End: 2023-06-29
  Administered 2023-06-29: 50 ug via INTRAVENOUS

## 2023-06-29 MED ORDER — PROPOFOL 10 MG/ML IV BOLUS
INTRAVENOUS | Status: DC | PRN
  Administered 2023-06-29: 150 mg via INTRAVENOUS

## 2023-06-29 MED ORDER — ATROPINE SULFATE 1 MG/10ML IJ SOSY
PREFILLED_SYRINGE | INTRAMUSCULAR | Status: AC
  Filled 2023-06-29: qty 10

## 2023-06-29 MED ORDER — ROCURONIUM BROMIDE 10 MG/ML (PF) SYRINGE
PREFILLED_SYRINGE | INTRAVENOUS | Status: DC | PRN
  Administered 2023-06-29 (×2): 10 mg via INTRAVENOUS
  Administered 2023-06-29: 50 mg via INTRAVENOUS

## 2023-06-29 MED ORDER — PROTAMINE SULFATE 10 MG/ML IV SOLN
INTRAVENOUS | Status: DC | PRN
  Administered 2023-06-29: 35 mg via INTRAVENOUS

## 2023-06-29 MED ORDER — ACETAMINOPHEN 500 MG PO TABS
ORAL_TABLET | ORAL | Status: AC
  Filled 2023-06-29: qty 2

## 2023-06-29 MED ORDER — MIDAZOLAM HCL 2 MG/2ML IJ SOLN
INTRAMUSCULAR | Status: AC
  Filled 2023-06-29: qty 2

## 2023-06-29 MED ORDER — PHENYLEPHRINE HCL-NACL 20-0.9 MG/250ML-% IV SOLN
INTRAVENOUS | Status: DC | PRN
Start: 2023-06-29 — End: 2023-06-29
  Administered 2023-06-29: 30 ug/min via INTRAVENOUS

## 2023-06-29 MED ORDER — SODIUM CHLORIDE 0.9% FLUSH: 3.0000 mL | Freq: Two times a day (BID) | INTRAVENOUS | Status: DC

## 2023-06-29 MED ORDER — APIXABAN 5 MG PO TABS
5.0000 mg | ORAL_TABLET | Freq: Once | ORAL | Status: AC
  Administered 2023-06-29: 5 mg via ORAL
  Filled 2023-06-29: qty 1

## 2023-06-29 MED ORDER — SODIUM CHLORIDE 0.9% FLUSH: 3.0000 mL | INTRAVENOUS | Status: DC | PRN

## 2023-06-29 SURGICAL SUPPLY — 2 items
CATH 8FR REPROCESSED SOUNDSTAR (CATHETERS) ×1 IMPLANT
INQWIRE 1.5J .035X260CM (WIRE) ×1 IMPLANT

## 2023-06-29 NOTE — Transfer of Care (Signed)
 Immediate Anesthesia Transfer of Care Note  Patient: Emma Stephens  Procedure(s) Performed: ATRIAL FIBRILLATION ABLATION  Patient Location: PACU  Anesthesia Type:General  Level of Consciousness: drowsy  Airway & Oxygen Therapy: Patient Spontanous Breathing and Patient connected to nasal cannula oxygen  Post-op Assessment: Report given to RN and Post -op Vital signs reviewed and stable  Post vital signs: Reviewed and stable  Last Vitals:  Vitals Value Taken Time  BP 154/86 06/29/23 1030  Temp 98.1   Pulse 87 06/29/23 1031  Resp 16 06/29/23 1031  SpO2 98 % 06/29/23 1031  Vitals shown include unfiled device data.  Last Pain:  Vitals:   06/29/23 0648  TempSrc: Oral  PainSc:       Patients Stated Pain Goal: 4 (06/29/23 0630)  Complications: There were no known notable events for this encounter.

## 2023-06-29 NOTE — Anesthesia Procedure Notes (Signed)
 Procedure Name: Intubation Date/Time: 06/29/2023 8:46 AM  Performed by: Colbert Coyer, CRNAPre-anesthesia Checklist: Patient identified, Emergency Drugs available, Suction available and Patient being monitored Patient Re-evaluated:Patient Re-evaluated prior to induction Oxygen Delivery Method: Circle System Utilized Preoxygenation: Pre-oxygenation with 100% oxygen Induction Type: IV induction Ventilation: Mask ventilation without difficulty Laryngoscope Size: Glidescope and 3 Grade View: Grade II Tube type: Oral Number of attempts: 1 Airway Equipment and Method: Stylet Placement Confirmation: ETT inserted through vocal cords under direct vision, positive ETCO2 and breath sounds checked- equal and bilateral Secured at: 22 cm Tube secured with: Tape Dental Injury: Teeth and Oropharynx as per pre-operative assessment

## 2023-06-29 NOTE — Anesthesia Preprocedure Evaluation (Signed)
 Anesthesia Evaluation  Patient identified by MRN, date of birth, ID band Patient awake    Reviewed: Allergy & Precautions, NPO status , Patient's Chart, lab work & pertinent test results  Airway Mallampati: II  TM Distance: >3 FB Neck ROM: Full    Dental  (+) Dental Advisory Given   Pulmonary sleep apnea , former smoker   breath sounds clear to auscultation       Cardiovascular hypertension, Pt. on medications + dysrhythmias Atrial Fibrillation  Rhythm:Regular Rate:Normal     Neuro/Psych  Neuromuscular disease    GI/Hepatic Neg liver ROS,GERD  ,,  Endo/Other  diabetes    Renal/GU negative Renal ROS     Musculoskeletal   Abdominal   Peds  Hematology   Anesthesia Other Findings   Reproductive/Obstetrics                             Anesthesia Physical Anesthesia Plan  ASA: 2  Anesthesia Plan: General   Post-op Pain Management: Tylenol PO (pre-op)*   Induction: Intravenous  PONV Risk Score and Plan: 3 and Dexamethasone, Ondansetron, Midazolam and Treatment may vary due to age or medical condition  Airway Management Planned: Oral ETT  Additional Equipment:   Intra-op Plan:   Post-operative Plan: Extubation in OR  Informed Consent: I have reviewed the patients History and Physical, chart, labs and discussed the procedure including the risks, benefits and alternatives for the proposed anesthesia with the patient or authorized representative who has indicated his/her understanding and acceptance.     Dental advisory given  Plan Discussed with: CRNA  Anesthesia Plan Comments:        Anesthesia Quick Evaluation

## 2023-06-29 NOTE — Progress Notes (Signed)
 10:50 hours, patient developed sudden onset of pain in the right groin burning in sensation with pain upon palpation. Direct pressure held for 15 minutes. Dr Jimmey Ralph advised and arrived at bedside. Groin assessment completed, with no hematoma or bleeding noted.

## 2023-06-29 NOTE — Discharge Instructions (Addendum)
 Stop flecainide in 1 month.     Cardiac Ablation, Care After  This sheet gives you information about how to care for yourself after your procedure. Your health care provider may also give you more specific instructions. If you have problems or questions, contact your health care provider. What can I expect after the procedure? After the procedure, it is common to have: Bruising around your puncture site. Tenderness around your puncture site. Skipped heartbeats. If you had an atrial fibrillation ablation, you may have atrial fibrillation during the first several months after your procedure.  Tiredness (fatigue).  Follow these instructions at home: Puncture site care  Follow instructions from your health care provider about how to take care of your puncture site. Make sure you: If present, leave stitches (sutures), skin glue, or adhesive strips in place. These skin closures may need to stay in place for up to 2 weeks. If adhesive strip edges start to loosen and curl up, you may trim the loose edges. Do not remove adhesive strips completely unless your health care provider tells you to do that. If a large square bandage is present, this may be removed 24 hours after surgery.  Check your puncture site every day for signs of infection. Check for: Redness, swelling, or pain. Fluid or blood. If your puncture site starts to bleed, lie down on your back, apply firm pressure to the area, and contact your health care provider. Warmth. Pus or a bad smell. A pea or marble sized lump/knot at the site is normal and can take up to three months to resolve.  Driving Do not drive for at least 4 days after your procedure or however long your health care provider recommends. (Do not resume driving if you have previously been instructed not to drive for other health reasons.) Do not drive or use heavy machinery while taking prescription pain medicine. Activity Avoid activities that take a lot of effort for  at least 7 days after your procedure. Do not lift anything that is heavier than 5 lb (4.5 kg) for one week.  No sexual activity for 1 week.  Return to your normal activities as told by your health care provider. Ask your health care provider what activities are safe for you. General instructions Take over-the-counter and prescription medicines only as told by your health care provider. Do not use any products that contain nicotine or tobacco, such as cigarettes and e-cigarettes. If you need help quitting, ask your health care provider. You may shower after 24 hours, but Do not take baths, swim, or use a hot tub for 1 week.  Do not drink alcohol for 24 hours after your procedure. Keep all follow-up visits as told by your health care provider. This is important. Contact a health care provider if: You have redness, mild swelling, or pain around your puncture site. You have fluid or blood coming from your puncture site that stops after applying firm pressure to the area. Your puncture site feels warm to the touch. You have pus or a bad smell coming from your puncture site. You have a fever. You have chest pain or discomfort that spreads to your neck, jaw, or arm. You have chest pain that is worse with lying on your back or taking a deep breath. You are sweating a lot. You feel nauseous. You have a fast or irregular heartbeat. You have shortness of breath. You are dizzy or light-headed and feel the need to lie down. You have pain or numbness in  the arm or leg closest to your puncture site. Get help right away if: Your puncture site suddenly swells. Your puncture site is bleeding and the bleeding does not stop after applying firm pressure to the area. These symptoms may represent a serious problem that is an emergency. Do not wait to see if the symptoms will go away. Get medical help right away. Call your local emergency services (911 in the [place] Do not drive yourself to the  hospital. Summary After the procedure, it is normal to have bruising and tenderness at the puncture site in your groin, neck, or forearm. Check your puncture site every day for signs of infection. Get help right away if your puncture site is bleeding and the bleeding does not stop after applying firm pressure to the area. This is a medical emergency. This information is not intended to replace advice given to you by your health care provider. Make sure you discuss any questions you have with your health care provider.

## 2023-06-29 NOTE — H&P (Signed)
 Electrophysiology Note:   Date:  06/29/23 ID:  Emma Stephens, DOB [birth year], MRN [id number]   Primary Cardiologist: None Electrophysiologist: Nobie Putnam, MD       History of Present Illness:   Emma Stephens is a 53 y.o. female with h/o HTN, PVCs, left internal carotid artery aneurysm and OSA who is seen today for follow up Electrophysiology evaluation.  Patient was first diagnosed with atrial fibrillation in April 2020.  She was was seen in the [facility] in June 2024 and found to be in atrial fibrillation with RVR after presenting with symptoms of chest pressure and palpitations.  More recently, she was admitted to the hospital 9/3-9/4 for another episode of atrial fibrillation.  Cardiology team was planning for a cardioversion; however, the patient spontaneously converted to sinus rhythm overnight. She was discharged home with a short acting Cardizem and continued on her Eliquis.  She was seen by me in clinic on 12/20/22. At that time, patient was in favor of catheter ablation but warranted evaluation of an intracranial aneurysm so we pursued AAD therapy with flecainide as a bridge. She underwent stenting of her intracranial aneurysm on 02/23/23.    Discussed the use of AI scribe software for clinical note transcription with the patient, who gave verbal consent to proceed.   History of Present Illness   The patient, with a history of atrial fibrillation and recently repaired cerebral aneurysm, presents for follow-up. She reports that the aneurysm repair went smoothly, with only minor post-operative headaches that have since resolved. However, she notes persistent morning swelling in one eye. She has not had a follow-up scan since the procedure, as the neurologist recommended waiting six months to allow the brain to heal. Regarding her atrial fibrillation, she reports shorter episodes that have not required hospitalization. She is currently on flecainide and metoprolol, which seem  to be managing the condition effectively. However, she expresses a desire to reduce her medication load, particularly the flecainide, if possible. The patient also reports persistent fatigue, which she wonders might be a side effect of her current medications. She has a known diagnosis of sleep apnea, but is not currently using a CPAP machine due to insurance issues. She expresses a desire to address this issue next and would like referral.      Interval: Patient reports today for planned ablation. Reports doing relatively well. No new or acute complaints.   Review of systems complete and found to be negative unless listed in HPI.    EP Information / Studies Reviewed:     EKG Interpretation Date/Time:                  Friday May 20 2023 08:50:29 EST Ventricular Rate:         80 PR Interval:                 186 QRS Duration:             74 QT Interval:                 396 QTC Calculation:456 R Axis:                         43   Text Interpretation:Normal sinus rhythm Right atrial enlargement Septal infarct (cited on or before 20-May-2023) When compared with ECG of 13-Sep-2022 09:14, No significant change was found Confirmed by Nobie Putnam [phone]) on 05/21/2023 3:08:47 PM  11/23/22 EKG:  12/09/2022 Echocardiogram: Normal LV size and function.  Of EF 60 to 65%.  Mild concentric LVH.  Grade 1 diastolic dysfunction. Normal RV size and function. No significant valvular disease mentioned. Normal left and right atrial size.   02/26/21 Coronary CTA: No evidence of CAD.  Normal origin with right dominance.  He is aortic atherosclerosis.   09/08/22 CTA Head and Neck:  IMPRESSION: 1. No emergent large vessel occlusion or high-grade stenosis of the intracranial arteries. 2. A 6 x 3 mm aneurysm projecting anteriorly from the supraclinoid left ICA. 3. 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   Risk Assessment/Calculations:      CHA2DS2-VASc Score = 3   This indicates a 3.2% annual risk of stroke. The patient's score is based upon: CHF History: 0 HTN History: 1 Diabetes History: 0 Stroke History: 0 Vascular Disease History: 1 Age Score: 0 Gender Score: 1           Physical Exam:    Today's Vitals   06/29/23 0630 06/29/23 0638 06/29/23 0648  BP:   (!) 153/83  Pulse:   67  Resp:   (!) 21  Temp:   97.8 F (36.6 C)  TempSrc:   Oral  SpO2:   96%  Weight:   81.6 kg  Height:   5\' 1"  (1.549 m)  PainSc: 0-No pain 0-No pain    Body mass index is 34.01 kg/m.   GEN: Well nourished, well developed in no acute distress CARDIAC: Normal rate and regular rhythm RESPIRATORY: Normal work of breathing ABDOMEN: Non-distended EXTREMITIES:  No edema; No deformity    ASSESSMENT AND PLAN:     #.  Paroxysmal atrial fibrillation, symptomatic: -Discussed treatment options today for AF including antiarrhythmic drug therapy (continuing flecainide) and ablation. Discussed risks, recovery and likelihood of success with each treatment strategy. Risk, benefits, and alternatives to EP study and ablation for afib were discussed. These risks include but are not limited to stroke, bleeding, vascular damage, tamponade, perforation, damage to the esophagus, lungs, phrenic nerve and other structures, pulmonary vein stenosis, worsening renal function, coronary vasospasm and death.  Discussed potential need for repeat ablation procedures and antiarrhythmic drugs after an initial ablation. The patient understands these risk and wishes to proceed with ablation today. -Continue flecainide and metoprolol for 1 month.   #.  Secondary hypercoagulable state due to atrial fibrillation: -She has a CHA2DS2-VASc score of 3. -Continue Eliquis.     Signed, Nobie Putnam, MD

## 2023-06-30 ENCOUNTER — Telehealth (HOSPITAL_COMMUNITY): Payer: Self-pay

## 2023-06-30 MED FILL — Fentanyl Citrate Preservative Free (PF) Inj 100 MCG/2ML: INTRAMUSCULAR | Qty: 2 | Status: AC

## 2023-06-30 NOTE — Telephone Encounter (Signed)
 Spoke with patient to complete post procedure follow up call.  Patient reports no complications with groin sites.   Instructions reviewed with patient:  Remove large bandage at puncture site after 24 hours. It is normal to have bruising, tenderness and a pea or marble sized lump/knot at the groin site which can take up to three months to resolve.  Get help right away if you notice sudden swelling at the puncture site.  Check your puncture site every day for signs of infection: fever, redness, swelling, pus drainage, warmth, foul odor or excessive pain. If this occurs, please call the office at [phone], to speak with the nurse. Get help right away if your puncture site is bleeding and the bleeding does not stop after applying firm pressure to the area.  You may continue to have skipped beats/ atrial fibrillation during the first several months after your procedure.  It is very important not to miss any doses of your blood thinner Eliquis. Patient restarted taking this medication on yesterday, 06/29/23.   You will follow up with the Afib clinic on 07/27/23 and follow up with the APP on 09/29/23.   Patient verbalized understanding to all instructions provided.

## 2023-06-30 NOTE — Anesthesia Postprocedure Evaluation (Signed)
 Anesthesia Post Note  Patient: Proofreader  Procedure(s) Performed: ATRIAL FIBRILLATION ABLATION     Patient location during evaluation: PACU Anesthesia Type: General Level of consciousness: awake and alert Pain management: pain level controlled Vital Signs Assessment: post-procedure vital signs reviewed and stable Respiratory status: spontaneous breathing, nonlabored ventilation, respiratory function stable and patient connected to nasal cannula oxygen Cardiovascular status: blood pressure returned to baseline and stable Postop Assessment: no apparent nausea or vomiting Anesthetic complications: no   There were no known notable events for this encounter.  Last Vitals:  Vitals:   06/29/23 1315 06/29/23 1400  BP:  (!) 158/87  Pulse: 78 80  Resp: (!) 21 14  Temp:    SpO2: 94% 97%    Last Pain:  Vitals:   06/29/23 1142  TempSrc:   PainSc: 0-No pain                 Kennieth Rad

## 2023-07-07 ENCOUNTER — Encounter: Payer: Self-pay | Admitting: Emergency Medicine

## 2023-07-15 ENCOUNTER — Ambulatory Visit: Admitting: Gastroenterology

## 2023-07-27 ENCOUNTER — Other Ambulatory Visit: Payer: Self-pay | Admitting: Nurse Practitioner

## 2023-07-27 ENCOUNTER — Ambulatory Visit (HOSPITAL_COMMUNITY)
Admission: RE | Admit: 2023-07-27 | Discharge: 2023-07-27 | Disposition: A | Discharge status: Home / Self Care | Source: Ambulatory Visit | Attending: Internal Medicine | Admitting: Internal Medicine

## 2023-07-27 ENCOUNTER — Encounter (HOSPITAL_COMMUNITY): Payer: Self-pay | Admitting: Internal Medicine

## 2023-07-27 VITALS — BP 128/76 | HR 77 | Ht 61.0 in | Wt 181.0 lb

## 2023-07-27 DIAGNOSIS — D6869 Other thrombophilia: Secondary | ICD-10-CM

## 2023-07-27 DIAGNOSIS — I480 Paroxysmal atrial fibrillation: Secondary | ICD-10-CM | POA: Diagnosis not present

## 2023-07-27 DIAGNOSIS — I10 Essential (primary) hypertension: Secondary | ICD-10-CM

## 2023-07-27 NOTE — Patient Instructions (Addendum)
Stop flecainide

## 2023-07-27 NOTE — Progress Notes (Signed)
 Primary Care Physician: Stephens, Emma Delaine, NP Primary Cardiologist: None Electrophysiologist: Emma Kohler, MD     Referring Physician: ED   Emma Stephens is a 53 y.o. female with a history of HTN, PVCs, left internal carotid artery aneurysm s/p stent, OSA, and paroxysmal atrial fibrillation who presents for consultation in the [facility].  The patient was initially diagnosed with atrial fibrillation in April 2020. Seen recently in [facility] ED 09/08/22 found to be in Afib with RVR; symptoms of chest pressure and palpitations. Transferred to [facility] for neurologic symptoms with negative head CT. Currently on Toprol  and diltiazem . Patient is on Eliquis  5 mg BID for a CHADS2VASC score of 3.  On follow up 07/27/23, patient is currently in NSR. S/p Afib ablation on 06/29/23 by Dr. Daneil Stephens. No episodes of Afib since ablation. She is on flecainide  100 mg BID. No chest pain or SOB. Leg sites healed without issue. No missed doses of Eliquis  5 mg BID.   Today, she denies symptoms of orthopnea, PND, lower extremity edema, dizziness, presyncope, syncope, snoring, daytime somnolence, bleeding, or neurologic sequela. The patient is tolerating medications without difficulties and is otherwise without complaint today.    Atrial Fibrillation Risk Factors:  she does have symptoms or diagnosis of sleep apnea. she is not compliant with CPAP therapy.   she has a BMI of Body mass index is 34.2 kg/m.Emma Stephens Filed Weights   07/27/23 1348  Weight: 82.1 kg     Current Outpatient Medications  Medication Sig Dispense Refill   apixaban  (ELIQUIS ) 5 MG TABS tablet Take 1 tablet (5 mg total) by mouth 2 (two) times daily. 180 tablet 3   aspirin  EC 81 MG tablet Take 81 mg by mouth daily. Swallow whole.     Cholecalciferol (VITAMIN D-3) 25 MCG (1000 UT) CAPS Take 1,000 Units by mouth daily.     cyanocobalamin  (VITAMIN B12) 1000 MCG tablet Take 1 tablet (1,000 mcg total) by mouth daily.      famotidine  (PEPCID ) 20 MG tablet TAKE 1 TABLET BY MOUTH EVERY DAY 30 tablet 2   losartan  (COZAAR ) 50 MG tablet Take 1 tablet (50 mg total) by mouth in the morning. 90 tablet 1   methylcellulose (CITRUCEL) oral powder Take 1 packet by mouth daily. 5 ml     metoprolol  succinate (TOPROL -XL) 25 MG 24 hr tablet TAKE 1 TABLET (25 MG TOTAL) BY MOUTH DAILY. 30 tablet 11   prenatal vitamin w/FE, FA (PRENATAL 1 + 1) 27-1 MG TABS tablet Take 1 tablet by mouth daily at 12 noon. iron      sertraline  (ZOLOFT ) 50 MG tablet TAKE 1 TABLET BY MOUTH EVERY DAY 30 tablet 11   spironolactone  (ALDACTONE ) 25 MG tablet TAKE 1 TABLET (25 MG TOTAL) BY MOUTH DAILY. 30 tablet 5   No current facility-administered medications for this encounter.    Atrial Fibrillation Management history:  Previous antiarrhythmic drugs: flecainide  Previous cardioversions: None Previous ablations: 06/29/23 Anticoagulation history: Eliquis  5 mg BID   ROS- All systems are reviewed and negative except as per the HPI above.  Physical Exam: BP 128/76   Pulse 77   Ht 5\' 1"  (1.549 m)   Wt 82.1 kg   BMI 34.20 kg/m   GEN- The patient is well appearing, alert and oriented x 3 today.   Neck - no JVD or carotid bruit noted Lungs- Clear to ausculation bilaterally, normal work of breathing Heart- Regular rate and rhythm, no murmurs, rubs or gallops, PMI not  laterally displaced Extremities- no clubbing, cyanosis, or edema Skin - no rash or ecchymosis noted   EKG today demonstrates  Vent. rate 77 BPM PR interval 172 ms QRS duration 84 ms QT/QTcB 386/436 ms P-R-T axes 41 28 55 Normal sinus rhythm Poor anterior R wave progression When compared with ECG of 29-Jun-2023 10:46, No significant change was found Confirmed by Emma Stephens ([id number]) on 07/27/2023 1:54:47 PM  Echo 12/09/22:  1. Left ventricular ejection fraction, by estimation, is 60 to 65%. The  left ventricle has normal function. The left ventricle has no regional  wall  motion abnormalities. There is mild concentric left ventricular  hypertrophy. Left ventricular diastolic  parameters are consistent with Grade I diastolic dysfunction (impaired  relaxation). The average left ventricular global longitudinal strain is  -18.0 %. The global longitudinal strain is normal.   2. Right ventricular systolic function is normal. The right ventricular  size is normal. Tricuspid regurgitation signal is inadequate for assessing  PA pressure.   3. The mitral valve is grossly normal. No evidence of mitral valve  regurgitation.   4. The aortic valve is tricuspid. Aortic valve regurgitation is not  visualized.   5. The inferior vena cava is normal in size with greater than 50%  respiratory variability, suggesting right atrial pressure of 3 mmHg.   CHA2DS2-VASc Score = 2  The patient's score is based upon: CHF History: 0 HTN History: 1 Diabetes History: 0 Stroke History: 0 Vascular Disease History: 0 Age Score: 0 Gender Score: 1        ASSESSMENT AND PLAN: Paroxysmal Atrial Fibrillation (ICD10:  I48.0) The patient's CHA2DS2-VASc score is 2, indicating a 2.2% annual risk of stroke.   S/p Afib ablation on 06/29/23 by Dr. Daneil Stephens.  She is currently in NSR. Stop flecainide  today per Dr. Daneil Stephens. Continue Toprol  25 mg daily.   Secondary Hypercoagulable State (ICD10:  D68.69) The patient is at significant risk for stroke/thromboembolism based upon her CHA2DS2-VASc Score of 2.  Continue Apixaban  (Eliquis ).  No missed doses of Eliquis . Will likely stop Eliquis  at upcoming appointment.    Follow up with EP as scheduled.    Emma Amber, PA-C  [facility] [address] [place], [place] [address] [phone]

## 2023-08-09 ENCOUNTER — Encounter: Payer: Self-pay | Admitting: Nurse Practitioner

## 2023-08-09 ENCOUNTER — Ambulatory Visit: Payer: BC Managed Care – PPO | Admitting: Nurse Practitioner

## 2023-08-09 VITALS — BP 122/68 | HR 78 | Temp 97.4°F | Ht 61.0 in | Wt 180.4 lb

## 2023-08-09 DIAGNOSIS — I10 Essential (primary) hypertension: Secondary | ICD-10-CM | POA: Diagnosis not present

## 2023-08-09 DIAGNOSIS — N951 Menopausal and female climacteric states: Secondary | ICD-10-CM

## 2023-08-09 DIAGNOSIS — E1169 Type 2 diabetes mellitus with other specified complication: Secondary | ICD-10-CM | POA: Diagnosis not present

## 2023-08-09 DIAGNOSIS — E785 Hyperlipidemia, unspecified: Secondary | ICD-10-CM | POA: Diagnosis not present

## 2023-08-09 DIAGNOSIS — D509 Iron deficiency anemia, unspecified: Secondary | ICD-10-CM | POA: Diagnosis not present

## 2023-08-09 LAB — CBC WITH DIFFERENTIAL/PLATELET
Basophils Absolute: 0 10*3/uL (ref 0.0–0.1)
Basophils Relative: 0.6 % (ref 0.0–3.0)
Eosinophils Absolute: 0.2 10*3/uL (ref 0.0–0.7)
Eosinophils Relative: 3.6 % (ref 0.0–5.0)
HCT: 37.9 % (ref 36.0–46.0)
Hemoglobin: 12.4 g/dL (ref 12.0–15.0)
Lymphocytes Relative: 50.1 % — ABNORMAL HIGH (ref 12.0–46.0)
Lymphs Abs: 2.8 10*3/uL (ref 0.7–4.0)
MCHC: 32.7 g/dL (ref 30.0–36.0)
MCV: 86.6 fl (ref 78.0–100.0)
Monocytes Absolute: 0.3 10*3/uL (ref 0.1–1.0)
Monocytes Relative: 6 % (ref 3.0–12.0)
Neutro Abs: 2.2 10*3/uL (ref 1.4–7.7)
Neutrophils Relative %: 39.7 % — ABNORMAL LOW (ref 43.0–77.0)
Platelets: 422 10*3/uL — ABNORMAL HIGH (ref 150.0–400.0)
RBC: 4.38 Mil/uL (ref 3.87–5.11)
RDW: 14.2 % (ref 11.5–15.5)
WBC: 5.6 10*3/uL (ref 4.0–10.5)

## 2023-08-09 LAB — IBC + FERRITIN
Ferritin: 34.8 ng/mL (ref 10.0–291.0)
Iron: 72 ug/dL (ref 42–145)
Saturation Ratios: 17.3 % — ABNORMAL LOW (ref 20.0–50.0)
TIBC: 415.8 ug/dL (ref 250.0–450.0)
Transferrin: 297 mg/dL (ref 212.0–360.0)

## 2023-08-09 LAB — HEMOGLOBIN A1C: Hgb A1c MFr Bld: 6.8 % — ABNORMAL HIGH (ref 4.6–6.5)

## 2023-08-09 LAB — LDL CHOLESTEROL, DIRECT: Direct LDL: 89 mg/dL

## 2023-08-09 MED ORDER — LOSARTAN POTASSIUM 50 MG PO TABS: 50.0000 mg | ORAL_TABLET | Freq: Every morning | ORAL | 3 refills | Status: AC

## 2023-08-09 MED ORDER — SPIRONOLACTONE 25 MG PO TABS: 25.0000 mg | ORAL_TABLET | Freq: Every day | ORAL | 3 refills | Status: AC

## 2023-08-09 MED ORDER — SERTRALINE HCL 50 MG PO TABS: 50.0000 mg | ORAL_TABLET | Freq: Every day | ORAL | 3 refills | Status: DC

## 2023-08-09 MED ORDER — METOPROLOL SUCCINATE ER 25 MG PO TB24: 25.0000 mg | ORAL_TABLET | Freq: Every day | ORAL | 3 refills | Status: DC

## 2023-08-09 MED ORDER — FAMOTIDINE 20 MG PO TABS: 20.0000 mg | ORAL_TABLET | Freq: Every day | ORAL | 1 refills | Status: DC

## 2023-08-09 NOTE — Assessment & Plan Note (Addendum)
 Presence of aortic atherosclerosis per CT ABDOMEN/pelvis 01/2023 Repeat LDL

## 2023-08-09 NOTE — Progress Notes (Signed)
 Established Patient Visit  Patient: Emma Stephens   DOB: [birth year]   53 y.o. Female  MRN: [id number] Visit Date: 08/09/2023  Subjective:     Chief Complaint  Patient presents with   Follow-up    3 month f/u   HPI DM (diabetes mellitus) (HCC) controlled with diet BP at goal LDL not at goal, no statin at this time Normal UACr No neuropathy Repeat hgbA1c and LDL F/up in 6months  Hyperlipidemia associated with type 2 diabetes mellitus (HCC) Presence of aortic atherosclerosis per CT ABDOMEN/pelvis 01/2023 Repeat LDL  Reviewed medical, surgical, and social history today  Medications: Outpatient Medications Prior to Visit  Medication Sig   apixaban  (ELIQUIS ) 5 MG TABS tablet Take 1 tablet (5 mg total) by mouth 2 (two) times daily.   aspirin  EC 81 MG tablet Take 81 mg by mouth daily. Swallow whole.   Cholecalciferol (VITAMIN D-3) 25 MCG (1000 UT) CAPS Take 1,000 Units by mouth daily.   cyanocobalamin  (VITAMIN B12) 1000 MCG tablet Take 1 tablet (1,000 mcg total) by mouth daily.   methylcellulose (CITRUCEL) oral powder Take 1 packet by mouth daily. 5 ml   prenatal vitamin w/FE, FA (PRENATAL 1 + 1) 27-1 MG TABS tablet Take 1 tablet by mouth daily at 12 noon. iron    [DISCONTINUED] famotidine  (PEPCID ) 20 MG tablet TAKE 1 TABLET BY MOUTH EVERY DAY   [DISCONTINUED] losartan  (COZAAR ) 50 MG tablet Take 1 tablet (50 mg total) by mouth in the morning.   [DISCONTINUED] metoprolol  succinate (TOPROL -XL) 25 MG 24 hr tablet TAKE 1 TABLET (25 MG TOTAL) BY MOUTH DAILY.   [DISCONTINUED] sertraline  (ZOLOFT ) 50 MG tablet TAKE 1 TABLET BY MOUTH EVERY DAY   [DISCONTINUED] spironolactone  (ALDACTONE ) 25 MG tablet TAKE 1 TABLET (25 MG TOTAL) BY MOUTH DAILY.   No facility-administered medications prior to visit.   Reviewed past medical and social history.   ROS per HPI above      Objective:  BP 122/68 (BP Location: Left Arm, Patient Position: Sitting, Cuff Size: Normal)   Pulse  78   Temp (!) 97.4 F (36.3 C) (Temporal)   Ht 5\' 1"  (1.549 m)   Wt 180 lb 6.4 oz (81.8 kg)   SpO2 97%   BMI 34.09 kg/m      Physical Exam Vitals and nursing note reviewed.  Cardiovascular:     Rate and Rhythm: Normal rate and regular rhythm.     Pulses: Normal pulses.     Heart sounds: Normal heart sounds.  Pulmonary:     Effort: Pulmonary effort is normal.     Breath sounds: Normal breath sounds.  Musculoskeletal:     Right lower leg: No edema.     Left lower leg: No edema.  Neurological:     Mental Status: She is alert and oriented to person, place, and time.  Psychiatric:        Mood and Affect: Mood normal.        Behavior: Behavior normal.        Thought Content: Thought content normal.     No results found for any visits on 08/09/23.    Assessment & Plan:    Problem List Items Addressed This Visit     DM (diabetes mellitus) (HCC) - Primary   controlled with diet BP at goal LDL not at goal, no statin at this time Normal UACr No neuropathy Repeat hgbA1c and LDL F/up in 6months  Relevant Medications   losartan  (COZAAR ) 50 MG tablet   Other Relevant Orders   Hemoglobin A1c   Essential hypertension, benign   Relevant Medications   losartan  (COZAAR ) 50 MG tablet   metoprolol  succinate (TOPROL -XL) 25 MG 24 hr tablet   spironolactone  (ALDACTONE ) 25 MG tablet   Hyperlipidemia associated with type 2 diabetes mellitus (HCC)   Presence of aortic atherosclerosis per CT ABDOMEN/pelvis 01/2023 Repeat LDL      Relevant Medications   losartan  (COZAAR ) 50 MG tablet   metoprolol  succinate (TOPROL -XL) 25 MG 24 hr tablet   spironolactone  (ALDACTONE ) 25 MG tablet   Other Relevant Orders   Direct LDL   Iron  deficiency anemia   Relevant Orders   CBC with Differential/Platelet   IBC + Ferritin   Perimenopausal vasomotor symptoms   Relevant Medications   losartan  (COZAAR ) 50 MG tablet   metoprolol  succinate (TOPROL -XL) 25 MG 24 hr tablet   sertraline   (ZOLOFT ) 50 MG tablet   spironolactone  (ALDACTONE ) 25 MG tablet   Return in about 6 months (around 02/09/2024) for DM, HTN, hyperlipidemia (fasting).     Kathrene Parents, NP

## 2023-08-09 NOTE — Patient Instructions (Signed)
 Go to lab Maintain Heart healthy diet and daily exercise. Maintain current medications.

## 2023-08-09 NOTE — Assessment & Plan Note (Signed)
 controlled with diet BP at goal LDL not at goal, no statin at this time Normal UACr No neuropathy Repeat hgbA1c and LDL F/up in 6months

## 2023-08-10 ENCOUNTER — Encounter: Payer: Self-pay | Admitting: Dietician

## 2023-08-10 ENCOUNTER — Encounter: Attending: Nurse Practitioner | Admitting: Dietician

## 2023-08-10 VITALS — Wt 181.8 lb

## 2023-08-10 DIAGNOSIS — E119 Type 2 diabetes mellitus without complications: Secondary | ICD-10-CM | POA: Insufficient documentation

## 2023-08-10 NOTE — Progress Notes (Signed)
 Diabetes Self-Management Education  Visit Type: First/Initial  Appt. Start Time: 0945 Appt. End Time: 1050  08/10/2023  [name], identified by name and date of birth, is a 53 y.o. female with a diagnosis of Diabetes: Type 2.   ASSESSMENT  History includes: type 2 diabetes, anemia, anxiety, GERD, sleep apnea, HLD, HTN Labs noted: 08/09/23 A1c 6.8% Medications include: reviewed Supplements: MVI, vitamin b12, vitamin d3  Pt reports she wants to learn how to eat better. Pt states she has multiple people in her family who have diabetes. Pt reports she wants to avoid being on any further medications. Pt states she would also like to lose weight to 175 lb, and states she felt healthier at this weight.   Pt reports she could not exercise for a while due to afib but states she can now and recently got [employer] membership and tries to go twice a week with her son but has not gone this month.   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 Pt reports she often eats breakfast and lunch here, along with snacks such as chips or cookies. Pt states she thinks they eat out for dinner more often than eating at home.   Pt states she stopped drinking soda for a while but recently started having it again (20 oz per day).   Weight 181 lb 12.8 oz (82.5 kg). Body mass index is 34.35 kg/m.   Diabetes Self-Management Education - 08/10/23 0944       Visit Information   Visit Type First/Initial      Initial Visit   Diabetes Type Type 2    Date Diagnosed 12/2022    Are you currently following a meal plan? No    Are you taking your medications as prescribed? Yes      Health Coping   How would you rate your overall health? Fair      Psychosocial Assessment   Patient Belief/Attitude about Diabetes Motivated to manage diabetes    What is the hardest part about your diabetes right now, causing you the most concern, or is the most worrisome to you about your diabetes?   Making  healty food and beverage choices    Self-care barriers None    Self-management support Doctor's office    Other persons present Patient    Patient Concerns Nutrition/Meal planning    Special Needs None    Preferred Learning Style No preference indicated    Learning Readiness Ready    How often do you need to have someone help you when you read instructions, pamphlets, or other written materials from your doctor or pharmacy? 1 - Never    What is the last grade level you completed in school? some college      Pre-Education Assessment   Patient understands the diabetes disease and treatment process. Needs Instruction    Patient understands incorporating nutritional management into lifestyle. Needs Instruction    Patient undertands incorporating physical activity into lifestyle. Needs Instruction    Patient understands using medications safely. Needs Instruction    Patient understands monitoring blood glucose, interpreting and using results Needs Instruction    Patient understands prevention, detection, and treatment of acute complications. Needs Instruction    Patient understands prevention, detection, and treatment of chronic complications. Needs Instruction    Patient understands how to develop strategies to address psychosocial issues. Needs Instruction    Patient understands how to develop strategies to promote health/change behavior. Needs Instruction  Complications   Last HgB A1C per patient/outside source 6.8 %    Have you had a dilated eye exam in the past 12 months? Yes    Have you had a dental exam in the past 12 months? No    Are you checking your feet? Yes      Dietary Intake   Breakfast burger king sausage cheese croissant OR instant oatmeal with butter and coffee cake    Snack (morning) chips    Lunch sheetz: pizza OR chicken nuggets and moz sticks OR chicken salad chick    Snack (afternoon) chips OR ham and cheese sandwich    Dinner olive garden OR mcdonalds OR  burgers    Snack (evening) cookies    Beverage(s) 32 oz water, coffee with hazelnut creamer,  20 oz soda      Activity / Exercise   Activity / Exercise Type Light (walking / raking leaves)    How many days per week do you exercise? 2    How many minutes per day do you exercise? 30    Total minutes per week of exercise 60      Patient Education   Previous Diabetes Education No    Disease Pathophysiology Definition of diabetes, type 1 and 2, and the diagnosis of diabetes;Explored patient's options for treatment of their diabetes;Factors that contribute to the development of diabetes    Healthy Eating Role of diet in the treatment of diabetes and the relationship between the three main macronutrients and blood glucose level;Plate Method;Meal options for control of blood glucose level and chronic complications.;Meal timing in regards to the patients' current diabetes medication.    Being Active Role of exercise on diabetes management, blood pressure control and cardiac health.;Helped patient identify appropriate exercises in relation to his/her diabetes, diabetes complications and other health issue.    Medications Reviewed patients medication for diabetes, action, purpose, timing of dose and side effects.    Monitoring Identified appropriate SMBG and/or A1C goals.;Daily foot exams;Yearly dilated eye exam    Chronic complications Relationship between chronic complications and blood glucose control;Identified and discussed with patient  current chronic complications    Diabetes Stress and Support Identified and addressed patients feelings and concerns about diabetes;Role of stress on diabetes;Worked with patient to identify barriers to care and solutions    Lifestyle and Health Coping Lifestyle issues that need to be addressed for better diabetes care      Individualized Goals (developed by patient)   Nutrition General guidelines for healthy choices and portions discussed    Physical Activity  Exercise 3-5 times per week;60 minutes per day    Medications Not Applicable    Monitoring  Test my blood glucose as discussed    Problem Solving Eating Pattern    Reducing Risk examine blood glucose patterns;do foot checks daily;treat hypoglycemia with 15 grams of carbs if blood glucose less than 70mg /dL    Health Coping Ask for help with psychological, social, or emotional issues      Post-Education Assessment   Patient understands the diabetes disease and treatment process. Comprehends key points    Patient understands incorporating nutritional management into lifestyle. Comprehends key points    Patient undertands incorporating physical activity into lifestyle. Comprehends key points    Patient understands using medications safely. N/A    Patient understands monitoring blood glucose, interpreting and using results Comprehends key points    Patient understands prevention, detection, and treatment of acute complications. Comprehends key points    Patient understands  prevention, detection, and treatment of chronic complications. Comprehends key points    Patient understands how to develop strategies to address psychosocial issues. Comprehends key points    Patient understands how to develop strategies to promote health/change behavior. Comprehends key points      Outcomes   Expected Outcomes Demonstrated interest in learning. Expect positive outcomes    Future DMSE 2 months    Program Status Not Completed             Individualized Plan for Diabetes Self-Management Training:   Learning Objective:  Patient will have a greater understanding of diabetes self-management. Patient education plan is to attend individual and/or group sessions per assessed needs and concerns.   Plan:   Patient Instructions  Goals Established by Patient:  Goal 1: exercise (gym, walking) 3 days a week for 1 hour.   Goal 2: drink 2 liter bottles of water daily.  Goal 3: include non-starchy vegetables  with lunch and dinner.   When snacking, aim to include a complex carb and protein.  At meals, aim to include 1/2 plate non-starchy vegetables, 1/4 plate protein, and 1/4 plate complex carbs.   Things to try: [employer] low sugar oatmeal  Expected Outcomes:  Demonstrated interest in learning. Expect positive outcomes  Education material provided: ADA - How to Thrive: A Guide for Your Journey with Diabetes, My Plate, and Snack sheet, Non-Starchy Vegetable List  If problems or questions, patient to contact team via:  Phone  Future DSME appointment: 2 months

## 2023-08-10 NOTE — Patient Instructions (Signed)
 Goals Established by Patient:  Goal 1: exercise (gym, walking) 3 days a week for 1 hour.   Goal 2: drink 2 liter bottles of water daily.  Goal 3: include non-starchy vegetables with lunch and dinner.   When snacking, aim to include a complex carb and protein.  At meals, aim to include 1/2 plate non-starchy vegetables, 1/4 plate protein, and 1/4 plate complex carbs.   Things to try: [employer] low sugar oatmeal

## 2023-08-11 ENCOUNTER — Ambulatory Visit: Payer: Self-pay | Admitting: Nurse Practitioner

## 2023-08-11 DIAGNOSIS — I700 Atherosclerosis of aorta: Secondary | ICD-10-CM | POA: Insufficient documentation

## 2023-08-11 DIAGNOSIS — E1169 Type 2 diabetes mellitus with other specified complication: Secondary | ICD-10-CM

## 2023-08-11 MED ORDER — ATORVASTATIN CALCIUM 20 MG PO TABS: 20.0000 mg | ORAL_TABLET | Freq: Every evening | ORAL | 1 refills | Status: DC

## 2023-09-02 ENCOUNTER — Telehealth (HOSPITAL_COMMUNITY): Payer: Self-pay | Admitting: *Deleted

## 2023-09-02 MED ORDER — FLECAINIDE ACETATE 100 MG PO TABS: 100.0000 mg | ORAL_TABLET | Freq: Two times a day (BID) | ORAL | 3 refills | Status: DC

## 2023-09-02 NOTE — Telephone Encounter (Signed)
 Patient called in stating for the last 2 days she is having frequent episodes of afib. Heart rates seem to be controlled but pt states she is in afib more than not.  Confirmed no missed doses of Eliquis  and compliant with metoprolol . Discussed with Caesar Caster PA will resume flecainide  100mg  twice a day follow up EKG in 1 week. Pt notified of recommendations educated on breakthrough during 3 month post ablation healing process. Pt will call if issues arise.

## 2023-09-07 ENCOUNTER — Ambulatory Visit (HOSPITAL_COMMUNITY)
Admission: RE | Admit: 2023-09-07 | Discharge: 2023-09-07 | Disposition: A | Discharge status: Home / Self Care | Source: Ambulatory Visit | Attending: Internal Medicine | Admitting: Internal Medicine

## 2023-09-07 VITALS — HR 80

## 2023-09-07 DIAGNOSIS — I4891 Unspecified atrial fibrillation: Secondary | ICD-10-CM | POA: Diagnosis not present

## 2023-09-07 NOTE — Progress Notes (Signed)
 Patient restarted flecainide  100 mg BID due to noting increasing episodes of Afib.   Bmet and magnesium  drawn today.  ECG today shows Vent. rate 80 BPM PR interval 170 ms QRS duration 84 ms QT/QTcB 388/447 ms P-R-T axes 40 34 32 Normal sinus rhythm Possible Anterior infarct , age undetermined Abnormal ECG When compared with ECG of 27-Jul-2023 13:50, No significant change was found   Continue flecainide  100 mg BID. Follow up as scheduled for 3 month post-ablation appointment. I did advise patient discussion will be had as to whether patient would like to re-trial off flecainide  or decide to continue medication.

## 2023-09-09 NOTE — Telephone Encounter (Signed)
 Called patient and informed her for my call. She thanked me for calling and got her scheduled to see Soyla Duverney on 09/20/23 at 11:20 AM. She verbalized understanding of appointment date and time and all (if any) questions were answered.

## 2023-09-20 ENCOUNTER — Ambulatory Visit: Admitting: Nurse Practitioner

## 2023-09-20 VITALS — BP 128/76 | HR 71 | Temp 98.2°F | Ht 61.0 in | Wt 178.6 lb

## 2023-09-20 DIAGNOSIS — E1169 Type 2 diabetes mellitus with other specified complication: Secondary | ICD-10-CM

## 2023-09-20 DIAGNOSIS — R0683 Snoring: Secondary | ICD-10-CM

## 2023-09-20 DIAGNOSIS — I480 Paroxysmal atrial fibrillation: Secondary | ICD-10-CM

## 2023-09-20 DIAGNOSIS — R400 Somnolence: Secondary | ICD-10-CM

## 2023-09-20 DIAGNOSIS — N951 Menopausal and female climacteric states: Secondary | ICD-10-CM

## 2023-09-20 DIAGNOSIS — F332 Major depressive disorder, recurrent severe without psychotic features: Secondary | ICD-10-CM

## 2023-09-20 DIAGNOSIS — I10 Essential (primary) hypertension: Secondary | ICD-10-CM | POA: Diagnosis not present

## 2023-09-20 DIAGNOSIS — F339 Major depressive disorder, recurrent, unspecified: Secondary | ICD-10-CM | POA: Insufficient documentation

## 2023-09-20 HISTORY — DX: Major depressive disorder, recurrent, unspecified: F33.9

## 2023-09-20 LAB — MICROALBUMIN / CREATININE URINE RATIO
Creatinine,U: 120.7 mg/dL
Microalb Creat Ratio: UNDETERMINED mg/g (ref 0.0–30.0)
Microalb, Ur: <0.7 mg/dL

## 2023-09-20 MED ORDER — VENLAFAXINE HCL ER 37.5 MG PO CP24: 37.5000 mg | ORAL_CAPSULE | Freq: Every day | ORAL | 5 refills | Status: DC

## 2023-09-20 NOTE — Assessment & Plan Note (Addendum)
 LMP in November, 2024 Minimal improvement with zoloft  50mg , but reports daytime somnolence

## 2023-09-20 NOTE — Assessment & Plan Note (Signed)
 Chronic, graudually worsening in last 6months, Lack of motivation, loss of doing a lot of things, irritable, does not want to be bothered, fatigue, easily overwhelmed, racing thoughts. Denies any hallucination, no S//HI No hx of IVC  She plans to schedule appointment with EAP therapist. Stop zoloft  Start effexor 37.5mg  daily x 2weeks, then 75mg  daily continuously. F/up with me in 74month or sooner if symptoms worsen.

## 2023-09-20 NOTE — Patient Instructions (Addendum)
 Go to lab Schedule appointment with therapist. Stop zoloft  Start effexor 37.5mg  daily x 2weeks, then 75mg  daily continuously. F/up with me in 30month or sooner if symptoms worsen.  Managing Depression, Adult Depression is a mental health condition that affects your thoughts, feelings, and actions. Being diagnosed with depression can bring you relief if you did not know why you have felt or behaved a certain way. It could also leave you feeling overwhelmed. Finding ways to manage your symptoms can help you feel more positive about your future. How to manage lifestyle changes Being depressed is difficult. Depression can increase the level of everyday stress. Stress can make depression symptoms worse. You may believe your symptoms cannot be managed or will never improve. However, there are many things you can try to help manage your symptoms. There is hope. Managing stress  Stress is your body's reaction to life changes and events, both good and bad. Stress can add to your feelings of depression. Learning to manage your stress can help lessen your feelings of depression. Try some of the following approaches to reducing your stress (stress reduction techniques): Listen to music that you enjoy and that inspires you. Try using a meditation app or take a meditation class. Develop a practice that helps you connect with your spiritual self. Walk in nature, pray, or go to a place of worship. Practice deep breathing. To do this, inhale slowly through your nose. Pause at the top of your inhale for a few seconds and then exhale slowly, letting yourself relax. Repeat this three or four times. Practice yoga to help relax and work your muscles. Choose a stress reduction technique that works for you. These techniques take time and practice to develop. Set aside 5-15 minutes a day to do them. Therapists can offer training in these techniques. Do these things to help manage stress: Keep a journal. Know your limits.  Set healthy boundaries for yourself and others, such as saying no when you think something is too much. Pay attention to how you react to certain situations. You may not be able to control everything, but you can change your reaction. Add humor to your life by watching funny movies or shows. Make time for activities that you enjoy and that relax you. Spend less time using electronics, especially at night before bed. The light from screens can make your brain think it is time to get up rather than go to bed.  Medicines Medicines, such as antidepressants, are often a part of treatment for depression. Talk with your pharmacist or health care provider about all the medicines, supplements, and herbal products that you take, their possible side effects, and what medicines and other products are safe to take together. Make sure to report any side effects you may have to your health care provider. Relationships Your health care provider may suggest family therapy, couples therapy, or individual therapy as part of your treatment. How to recognize changes Everyone responds differently to treatment for depression. As you recover from depression, you may start to: Have more interest in doing activities. Feel more hopeful. Have more energy. Eat a more regular amount of food. Have better mental focus. It is important to recognize if your depression is not getting better or is getting worse. The symptoms you had in the beginning may return, such as: Feeling tired. Eating too much or too little. Sleeping too much or too little. Feeling restless, agitated, or hopeless. Trouble focusing or making decisions. Having unexplained aches and pains. Feeling irritable,  angry, or aggressive. If you or your family members notice these symptoms coming back, let your health care provider know right away. Follow these instructions at home: Activity Try to get some form of exercise each day, such as walking. Try  yoga, mindfulness, or other stress reduction techniques. Participate in group activities if you are able. Lifestyle Get enough sleep. Cut down on or stop using caffeine, tobacco, alcohol, and any other harmful substances. Eat a healthy diet that includes plenty of vegetables, fruits, whole grains, low-fat dairy products, and lean protein. Limit foods that are high in solid fats, added sugar, or salt (sodium). General instructions Take over-the-counter and prescription medicines only as told by your health care provider. Keep all follow-up visits. It is important for your health care provider to check on your mood, behavior, and medicines. Your health care provider may need to make changes to your treatment. Where to find support Talking to others  Friends and family members can be sources of support and guidance. Talk to trusted friends or family members about your condition. Explain your symptoms and let them know that you are working with a health care provider to treat your depression. Tell friends and family how they can help. Finances Find mental health providers that fit with your financial situation. Talk with your health care provider if you are worried about access to food, housing, or medicine. Call your insurance company to learn about your co-pays and prescription plan. Where to find more information You can find support in your area from: Anxiety and Depression Association of America (ADAA): adaa.org Mental Health America: mentalhealthamerica.net [employer] on Mental Illness: nami.org Contact a health care provider if: You stop taking your antidepressant medicines, and you have any of these symptoms: Nausea. Headache. Light-headedness. Chills and body aches. Not being able to sleep (insomnia). You or your friends and family think your depression is getting worse. Get help right away if: You have thoughts of hurting yourself or others. Get help right away if you  feel like you may hurt yourself or others, or have thoughts about taking your own life. Go to your nearest emergency room or: Call 911. Call the National Suicide Prevention Lifeline at 312-525-7731 or 988. This is open 24 hours a day. Text the Crisis Text Line at 201-303-9518. This information is not intended to replace advice given to you by your health care provider. Make sure you discuss any questions you have with your health care provider. Document Revised: 07/14/2021 Document Reviewed: 07/14/2021 Elsevier Patient Education  2024 [employer].

## 2023-09-20 NOTE — Progress Notes (Signed)
 Established Patient Visit  Patient: Emma Stephens   DOB: [birth year]   53 y.o. Female  MRN: [id number] Visit Date: 09/20/2023  Subjective:    Chief Complaint  Patient presents with   Follow-up    Follow up for anxiety and depression    HPI Perimenopausal vasomotor symptoms LMP in November, 2024 Minimal improvement with zoloft  50mg , but reports daytime somnolence  Severe episode of recurrent major depressive disorder, without psychotic features (HCC) Chronic, graudually worsening in last 6months, Lack of motivation, loss of doing a lot of things, irritable, does not want to be bothered, fatigue, easily overwhelmed, racing thoughts. Denies any hallucination, no S//HI No hx of IVC  She plans to schedule appointment with EAP therapist. Stop zoloft  Start effexor 37.5mg  daily x 2weeks, then 75mg  daily continuously. F/up with me in 64month or sooner if symptoms worsen.     09/20/2023   11:10 AM 08/10/2023    9:51 AM 03/31/2023    8:46 AM  Depression screen PHQ 2/9  Decreased Interest 3 1 0  Down, Depressed, Hopeless 2 1 0  PHQ - 2 Score 5 2 0  Altered sleeping 3    Tired, decreased energy 3    Change in appetite 2    Feeling bad or failure about yourself  2    Trouble concentrating 3    Moving slowly or fidgety/restless 1    Suicidal thoughts 0    PHQ-9 Score 19    Difficult doing work/chores Somewhat difficult         09/20/2023   11:10 AM 07/02/2022   10:30 AM 12/25/2021   11:26 AM 07/24/2021   10:42 AM  GAD 7 : Generalized Anxiety Score  Nervous, Anxious, on Edge 2 0 0 0  Control/stop worrying 2 1 0 1  Worry too much - different things 2 1 1 1   Trouble relaxing 3 2 1 3   Restless 2 0 0 1  Easily annoyed or irritable 2 2 1 1   Afraid - awful might happen 1 2 0 1  Total GAD 7 Score 14 8 3 8   Anxiety Difficulty Somewhat difficult Somewhat difficult Not difficult at all Somewhat difficult     Reviewed medical, surgical, and social history  today  Medications: Outpatient Medications Prior to Visit  Medication Sig   apixaban  (ELIQUIS ) 5 MG TABS tablet Take 1 tablet (5 mg total) by mouth 2 (two) times daily.   aspirin  EC 81 MG tablet Take 81 mg by mouth daily. Swallow whole.   atorvastatin  (LIPITOR) 20 MG tablet Take 1 tablet (20 mg total) by mouth every evening.   Cholecalciferol (VITAMIN D-3) 25 MCG (1000 UT) CAPS Take 1,000 Units by mouth daily.   cyanocobalamin  (VITAMIN B12) 1000 MCG tablet Take 1 tablet (1,000 mcg total) by mouth daily.   famotidine  (PEPCID ) 20 MG tablet Take 1 tablet (20 mg total) by mouth daily.   flecainide  (TAMBOCOR ) 100 MG tablet Take 1 tablet (100 mg total) by mouth 2 (two) times daily.   losartan  (COZAAR ) 50 MG tablet Take 1 tablet (50 mg total) by mouth in the morning.   methylcellulose (CITRUCEL) oral powder Take 1 packet by mouth daily. 5 ml   metoprolol  succinate (TOPROL -XL) 25 MG 24 hr tablet Take 1 tablet (25 mg total) by mouth daily.   prenatal vitamin w/FE, FA (PRENATAL 1 + 1) 27-1 MG TABS tablet Take 1 tablet by mouth daily at 12  noon. iron    spironolactone  (ALDACTONE ) 25 MG tablet Take 1 tablet (25 mg total) by mouth daily.   [DISCONTINUED] sertraline  (ZOLOFT ) 50 MG tablet Take 1 tablet (50 mg total) by mouth daily.   No facility-administered medications prior to visit.   Reviewed past medical and social history.   ROS per HPI above      Objective:  BP 128/76 (BP Location: Left Arm, Patient Position: Sitting, Cuff Size: Normal)   Pulse 71   Temp 98.2 F (36.8 C) (Oral)   Ht 5' 1 (1.549 m)   Wt 178 lb 9.6 oz (81 kg)   SpO2 99%   BMI 33.75 kg/m      Physical Exam Vitals and nursing note reviewed.   Cardiovascular:     Rate and Rhythm: Normal rate.     Pulses: Normal pulses.  Pulmonary:     Effort: Pulmonary effort is normal.   Neurological:     Mental Status: She is alert and oriented to person, place, and time.   Psychiatric:        Attention and Perception:  Attention normal.        Mood and Affect: Mood is depressed. Affect is tearful.        Behavior: Behavior is cooperative.        Thought Content: Thought content normal.        Cognition and Memory: Cognition and memory normal.        Judgment: Judgment normal.     No results found for any visits on 09/20/23.    Assessment & Plan:    Problem List Items Addressed This Visit     DM (diabetes mellitus) (HCC)   Relevant Orders   Urine microalbumin-creatinine with uACR   Essential hypertension, benign   Relevant Orders   Ambulatory referral to Sleep Studies   Paroxysmal atrial fibrillation (HCC)   Relevant Orders   Ambulatory referral to Sleep Studies   Perimenopausal vasomotor symptoms   LMP in November, 2024 Minimal improvement with zoloft  50mg , but reports daytime somnolence      Relevant Medications   venlafaxine XR (EFFEXOR XR) 37.5 MG 24 hr capsule   Severe episode of recurrent major depressive disorder, without psychotic features (HCC) - Primary   Chronic, graudually worsening in last 6months, Lack of motivation, loss of doing a lot of things, irritable, does not want to be bothered, fatigue, easily overwhelmed, racing thoughts. Denies any hallucination, no S//HI No hx of IVC  She plans to schedule appointment with EAP therapist. Stop zoloft  Start effexor 37.5mg  daily x 2weeks, then 75mg  daily continuously. F/up with me in 31month or sooner if symptoms worsen.      Relevant Medications   venlafaxine XR (EFFEXOR XR) 37.5 MG 24 hr capsule   Other Visit Diagnoses       Uncontrolled daytime somnolence       Relevant Orders   Ambulatory referral to Sleep Studies     Snoring       Relevant Orders   Ambulatory referral to Sleep Studies      Return in about 4 weeks (around 10/18/2023) for depression and anxiety.     Roselie Mood, NP

## 2023-09-21 ENCOUNTER — Ambulatory Visit: Payer: Self-pay | Admitting: Nurse Practitioner

## 2023-09-29 ENCOUNTER — Ambulatory Visit: Attending: Pulmonary Disease | Admitting: Pulmonary Disease

## 2023-09-29 ENCOUNTER — Encounter: Payer: Self-pay | Admitting: Pulmonary Disease

## 2023-09-29 VITALS — BP 118/80 | HR 79 | Ht 61.0 in | Wt 177.0 lb

## 2023-09-29 DIAGNOSIS — D6869 Other thrombophilia: Secondary | ICD-10-CM | POA: Diagnosis not present

## 2023-09-29 DIAGNOSIS — Z79899 Other long term (current) drug therapy: Secondary | ICD-10-CM | POA: Diagnosis not present

## 2023-09-29 DIAGNOSIS — I480 Paroxysmal atrial fibrillation: Secondary | ICD-10-CM | POA: Diagnosis not present

## 2023-09-29 DIAGNOSIS — I4891 Unspecified atrial fibrillation: Secondary | ICD-10-CM | POA: Diagnosis not present

## 2023-09-29 DIAGNOSIS — I493 Ventricular premature depolarization: Secondary | ICD-10-CM

## 2023-09-29 NOTE — Progress Notes (Signed)
 Electrophysiology Office Note:   Date:  09/29/2023  ID:  Emma Stephens, DOB [birth year], MRN [id number]  Primary Cardiologist: None Primary Heart Failure: None Electrophysiologist: Fonda Kitty, MD      History of Present Illness:   Emma Stephens is a 53 y.o. female with h/o AF, PVC's, HTN, HLD, aortic atherosclerosis, intracranial aneurysm s/p stent (02/2023), OSA, GERD seen today for routine electrophysiology follow-up s/p Ablation.  She was seen in the AF Clinic on 07/27/23 and flecainide  was stopped.  The patient had recurrent AF and she restarted.  Since last being seen in our clinic the patient reports she went off flecainide  after her ablation and had initially short bursts of AF then had an episode lasting 2-1/2 days.  She reports she has been on flecainide  for almost 1 month and has had no AF episodes.  Reports compliance with OAC.  No issues with bleeding.  She denies chest pain, palpitations, dyspnea, PND, orthopnea, nausea, vomiting, dizziness, syncope, edema, weight gain, or early satiety.    Review of systems complete and found to be negative unless listed in HPI.   EP Information / Studies Reviewed:    EKG is ordered today. Personal review as below.  EKG Interpretation Date/Time:  Thursday September 29 2023 14:57:06 EDT Ventricular Rate:  79 PR Interval:  172 QRS Duration:  72 QT Interval:  388 QTC Calculation: 444 R Axis:   48  Text Interpretation: Normal sinus rhythm Normal ECG Confirmed by Aniceto Jarvis ([id number]) on 09/29/2023 3:21:51 PM   Studies:  Coronary CTA 02/26/2021 > no evidence of CAD. Normal origin with right dominance. Aortic atherosclerosis noted  ECHO 11/2022 > LVEF 60-65%, G1DD EPS 06/29/23 > ablation of PV's & posterior wall  Arrhythmia / AAD AF > initial dx 06/2018 Flecainide      Risk Assessment/Calculations:    CHA2DS2-VASc Score = 3   This indicates a 3.2% annual risk of stroke. The patient's score is based upon: CHF History: 0 HTN History:  1 Diabetes History: 0 Stroke History: 0 Vascular Disease History: 1 Age Score: 0 Gender Score: 1             Physical Exam:   VS:  BP 118/80 (BP Location: Right Arm)   Pulse 79   Ht 5' 1 (1.549 m)   Wt 177 lb (80.3 kg)   SpO2 95%   BMI 33.44 kg/m    Wt Readings from Last 3 Encounters:  09/29/23 177 lb (80.3 kg)  09/20/23 178 lb 9.6 oz (81 kg)  08/10/23 181 lb 12.8 oz (82.5 kg)     GEN: Well nourished, well developed in no acute distress NECK: No JVD; No carotid bruits CARDIAC: Regular rate and rhythm, no murmurs, rubs, gallops RESPIRATORY:  Clear to auscultation without rales, wheezing or rhonchi  ABDOMEN: Soft, non-tender, non-distended EXTREMITIES:  No edema; No deformity   ASSESSMENT AND PLAN:    Paroxysmal Atrial Fibrillation  High Risk Medication Monitoring: Flecainide   CHA2DS2-VASc 3, s/p ablation  - Continue flecainide  100 mg BID for an additional month, will plan at next visit to stop flecainide  but keep on OAC to observe for recurrence of AF.  May need to assess short-term monitor to determine if patient is actually experiencing AF versus PACs or other atrial arrhythmia -continue Toprol   -EKG with NSR, stable intervals  -OAC for stroke prophylaxis > she asks about coming off blood thinners, discussed indications for OAC, patient should remain on Eliquis   Secondary Hypercoagulable State  -continue Eliquis , dose reviewed and appropriate  by age / wt  PVC's -Toprol  / flecainide  as above   Hypertension  -well controlled on current regimen    OSA  -per Dr. Shlomo   L ICA Aneurysm s/p stent  02/23/23 by Dr. Everitt Nile Erichsen -per Neuro IR    Follow up with Dr. Kennyth 6 weeks   Signed, Daphne Barrack, NP-C, AGACNP-BC [facility] - Electrophysiology  09/29/2023, 5:37 PM

## 2023-09-29 NOTE — Patient Instructions (Signed)
 Medication Instructions:  Your physician recommends that you continue on your current medications as directed. Please refer to the Current Medication list given to you today.  *If you need a refill on your cardiac medications before your next appointment, please call your pharmacy*  Lab Work: None ordered If you have labs (blood work) drawn today and your tests are completely normal, you will receive your results only by: MyChart Message (if you have MyChart) OR A paper copy in the mail If you have any lab test that is abnormal or we need to change your treatment, we will call you to review the results.  Follow-Up: At [facility], you and your health needs are our priority.  As part of our continuing mission to provide you with exceptional heart care, our providers are all part of one team.  This team includes your primary Cardiologist (physician) and Advanced Practice Providers or APPs (Physician Assistants and Nurse Practitioners) who all work together to provide you with the care you need, when you need it.  Your next appointment:   6 week(s)  Provider:   Daphne Barrack, NP

## 2023-10-12 ENCOUNTER — Other Ambulatory Visit: Payer: Self-pay | Admitting: Nurse Practitioner

## 2023-10-12 DIAGNOSIS — F332 Major depressive disorder, recurrent severe without psychotic features: Secondary | ICD-10-CM

## 2023-10-12 DIAGNOSIS — N951 Menopausal and female climacteric states: Secondary | ICD-10-CM

## 2023-10-14 ENCOUNTER — Ambulatory Visit: Admitting: Dietician

## 2023-10-28 ENCOUNTER — Ambulatory Visit: Admitting: Nurse Practitioner

## 2023-11-09 NOTE — Progress Notes (Unsigned)
 Electrophysiology Office Note:   Date:  11/11/2023  ID:  Emma Stephens, DOB May 25, 1970, MRN [id number]  Primary Cardiologist: None Primary Heart Failure: None Electrophysiologist: Fonda Kitty, MD      History of Present Illness:   Emma Stephens is a 53 y.o. female with h/o AF, PVC's, HTN, HLD, aortic atherosclerosis, intracranial aneurysm s/p stent (02/2023), OSA, GERD seen today for routine electrophysiology followup.   Seen in the AF Clinic on 07/27/23 and flecainide  was stopped.  The patient had recurrent AF and she restarted.  Since last being seen in our clinic the patient reports she went off flecainide  after her ablation and had initially short bursts of AF then had an episode lasting 2-1/2 days.  She reported she had been on flecainide  for almost 1 month and has had no AF episodes.  Reported compliance with OAC.    Since last being seen in our clinic the patient reports doing well. She has not had any further evidence of AF. She wears a [employer] watch - has not had any alerts and has not had physical symptoms.    She denies chest pain, palpitations, dyspnea, PND, orthopnea, nausea, vomiting, dizziness, syncope, edema, weight gain, or early satiety.   Review of systems complete and found to be negative unless listed in HPI.   EP Information / Studies Reviewed:    EKG is ordered today. Personal review as below.  EKG Interpretation Date/Time:  Friday November 11 2023 10:21:37 EDT Ventricular Rate:  75 PR Interval:  170 QRS Duration:  86 QT Interval:  400 QTC Calculation: 446 R Axis:   8  Text Interpretation: Normal sinus rhythm Normal ECG Confirmed by Aniceto Jarvis ([id number]) on 11/11/2023 10:28:55 AM   Studies:  Coronary CTA 02/26/2021 > no evidence of CAD. Aortic atherosclerosis noted  ECHO 11/2022 > LVEF 60-65%, G1DD   Arrhythmia / AAD AF > initial dx 06/2018 Flecainide    EPS 06/29/23 > ablation of PV's & posterior wall  Risk Assessment/Calculations:     CHA2DS2-VASc Score = 3   This indicates a 3.2% annual risk of stroke. The patient's score is based upon: CHF History: 0 HTN History: 1 Diabetes History: 0 Stroke History: 0 Vascular Disease History: 1 Age Score: 0 Gender Score: 1             Physical Exam:   VS:  BP 134/78   Pulse 75   Ht 5' 1 (1.549 m)   Wt 180 lb (81.6 kg)   SpO2 96%   BMI 34.01 kg/m    Wt Readings from Last 3 Encounters:  11/11/23 180 lb (81.6 kg)  11/10/23 180 lb 3.2 oz (81.7 kg)  09/29/23 177 lb (80.3 kg)     GEN: Well nourished, well developed in no acute distress NECK: No JVD; No carotid bruits CARDIAC: Regular rate and rhythm, no murmurs, rubs, gallops RESPIRATORY:  Clear to auscultation without rales, wheezing or rhonchi  ABDOMEN: Soft, non-tender, non-distended EXTREMITIES:  No edema; No deformity   ASSESSMENT AND PLAN:    Paroxysmal Atrial Fibrillation  High Risk Medication Monitoring: Flecainide   CHA2DS2-VASc 3, s/p ablation 06/2023 -OAC for stroke prophylaxis  -EKG with NSR, stable intervals    -no further AF burden post initial episode in June / post ablation on flecainide , stop flecainide     -if further symptoms, consider monitor to assess for ectopy as source of palpitations vs AF  -Increase Toprol  to 50 mg PO every day with hx PVC's / modestly elevated BP  Secondary Hypercoagulable  State  -continue Eliquis , dose reviewed and appropriate by wt / age  -discussed indications for [facility], reviewed pt to continue   PVC's  -Toprol  as above   Hypertension  -initially elevated, repeat check in clinic wnl    OSA  -follows with Dr. Shlomo > pending sleep evaluation   L ICA Aneurysm s/p stent  02/23/23 by Dr. Everitt Nile Erichsen -per Neuro IR   Follow up with Dr. Kennyth in 6 months  Signed, Daphne Barrack, NP-C, AGACNP-BC [facility] - Electrophysiology  11/11/2023, 10:54 AM

## 2023-11-10 ENCOUNTER — Ambulatory Visit: Admitting: Nurse Practitioner

## 2023-11-10 ENCOUNTER — Ambulatory Visit: Payer: Self-pay | Admitting: Nurse Practitioner

## 2023-11-10 ENCOUNTER — Encounter: Payer: Self-pay | Admitting: Nurse Practitioner

## 2023-11-10 VITALS — BP 138/86 | HR 80 | Temp 98.3°F | Ht 61.0 in | Wt 180.2 lb

## 2023-11-10 DIAGNOSIS — M545 Low back pain, unspecified: Secondary | ICD-10-CM | POA: Diagnosis not present

## 2023-11-10 DIAGNOSIS — Z7984 Long term (current) use of oral hypoglycemic drugs: Secondary | ICD-10-CM

## 2023-11-10 DIAGNOSIS — F332 Major depressive disorder, recurrent severe without psychotic features: Secondary | ICD-10-CM

## 2023-11-10 DIAGNOSIS — N951 Menopausal and female climacteric states: Secondary | ICD-10-CM

## 2023-11-10 DIAGNOSIS — E785 Hyperlipidemia, unspecified: Secondary | ICD-10-CM | POA: Diagnosis not present

## 2023-11-10 DIAGNOSIS — E1169 Type 2 diabetes mellitus with other specified complication: Secondary | ICD-10-CM | POA: Diagnosis not present

## 2023-11-10 DIAGNOSIS — Z9889 Other specified postprocedural states: Secondary | ICD-10-CM

## 2023-11-10 DIAGNOSIS — R400 Somnolence: Secondary | ICD-10-CM

## 2023-11-10 DIAGNOSIS — G4733 Obstructive sleep apnea (adult) (pediatric): Secondary | ICD-10-CM

## 2023-11-10 DIAGNOSIS — I10 Essential (primary) hypertension: Secondary | ICD-10-CM | POA: Diagnosis not present

## 2023-11-10 LAB — COMPREHENSIVE METABOLIC PANEL WITH GFR
ALT: 20 U/L (ref 0–35)
AST: 20 U/L (ref 0–37)
Albumin: 4.5 g/dL (ref 3.5–5.2)
Alkaline Phosphatase: 105 U/L (ref 39–117)
BUN: 12 mg/dL (ref 6–23)
CO2: 30 meq/L (ref 19–32)
Calcium: 9.4 mg/dL (ref 8.4–10.5)
Chloride: 101 meq/L (ref 96–112)
Creatinine, Ser: 0.67 mg/dL (ref 0.40–1.20)
GFR: 98.96 mL/min (ref >60.00)
Glucose, Bld: 123 mg/dL — ABNORMAL HIGH (ref 70–99)
Potassium: 4.1 meq/L (ref 3.5–5.1)
Sodium: 140 meq/L (ref 135–145)
Total Bilirubin: 0.4 mg/dL (ref 0.2–1.2)
Total Protein: 7.7 g/dL (ref 6.0–8.3)

## 2023-11-10 LAB — POCT URINALYSIS DIPSTICK
Bilirubin, UA: NEGATIVE
Blood, UA: NEGATIVE
Glucose, UA: NEGATIVE
Ketones, UA: NEGATIVE
Leukocytes, UA: NEGATIVE
Nitrite, UA: NEGATIVE
Protein, UA: POSITIVE — AB
Spec Grav, UA: >=1.03 — AB (ref 1.010–1.025)
Urobilinogen, UA: NEGATIVE U/dL — AB
pH, UA: 6 (ref 5.0–8.0)

## 2023-11-10 LAB — VITAMIN D 25 HYDROXY (VIT D DEFICIENCY, FRACTURES): VITD: 32.14 ng/mL (ref 30.00–100.00)

## 2023-11-10 LAB — LIPID PANEL
Cholesterol: 114 mg/dL (ref 0–200)
HDL: 47.6 mg/dL (ref >39.00)
LDL Cholesterol: 55 mg/dL (ref 0–99)
NonHDL: 66.65
Total CHOL/HDL Ratio: 2
Triglycerides: 57 mg/dL (ref 0.0–149.0)
VLDL: 11.4 mg/dL (ref 0.0–40.0)

## 2023-11-10 LAB — HEMOGLOBIN A1C: Hgb A1c MFr Bld: 7.6 % — ABNORMAL HIGH (ref 4.6–6.5)

## 2023-11-10 LAB — VITAMIN B12: Vitamin B-12: 946 pg/mL — ABNORMAL HIGH (ref 211–911)

## 2023-11-10 MED ORDER — VENLAFAXINE HCL ER 75 MG PO CP24
75.0000 mg | ORAL_CAPSULE | Freq: Every day | ORAL | 5 refills | Status: DC
Start: 2023-11-10 — End: 2023-12-02

## 2023-11-10 MED ORDER — METFORMIN HCL ER 500 MG PO TB24
500.0000 mg | ORAL_TABLET | Freq: Every day | ORAL | 1 refills | Status: DC
Start: 2023-11-10 — End: 2024-02-09

## 2023-11-10 NOTE — Assessment & Plan Note (Addendum)
 Followed by Smiley Pack Advised to schedule f/up appointment.

## 2023-11-10 NOTE — Assessment & Plan Note (Signed)
 Improved mood with effexor  Did not schedule appointment with counselor

## 2023-11-10 NOTE — Patient Instructions (Addendum)
 Schedule with Dr. Shlomo for sleep apnea Schedule appointment with GI [phone] Increase effexor  dose to 75mg  daily Monitor BP in AM 3x/week Bring BP reading to next appt Go to lab

## 2023-11-10 NOTE — Assessment & Plan Note (Signed)
 Persistent hot flashes, but improvement mood. Denies any adverse effects with effexor  37.5mg  XR  Increase dose to 75mg  daily Consider referral to gyn to discuss HRT if no improvement F/up in 3months

## 2023-11-10 NOTE — Assessment & Plan Note (Signed)
 high B12: do not take any OVER THE COUNTER B12 supplement. Normal vit. D Stable renal and liver function  F/up with cardiology

## 2023-11-10 NOTE — Progress Notes (Signed)
 Established Patient Visit  Patient: Emma Stephens   DOB: 07-May-1970   53 y.o. Female  MRN: [id number] Visit Date: 11/10/2023  Subjective:    Chief Complaint  Patient presents with   Follow-up    3 month follow up  Discuss sleep referral-closed due to being with Cardiology  DUE for Prevnar and Shingles vaccines (THINKING)    HPI Perimenopausal vasomotor symptoms Persistent hot flashes, but improvement mood. Denies any adverse effects with effexor  37.5mg  XR  Increase dose to 75mg  daily Consider referral to gyn to discuss HRT if no improvement F/up in 3months  Essential hypertension, benign BP at goal losartana nd spironolactone  BP Readings from Last 3 Encounters:  11/10/23 138/86  09/29/23 118/80  09/20/23 128/76    Maintain med doses Repeat CMP F/up in 3months  Hyperlipidemia associated with type 2 diabetes mellitus (HCC) No adverse effect with atorvastatin  Repeat lipid panel and CMP Maintain med dose  DM (diabetes mellitus) (HCC) controlled with diet BP at goal LDL not at goal, use of atorvastatin  Normal UACr No neuropathy Repeat hgbA1c, CMP, and lipid panel F/up in 6months  Status post coil embolization of cerebral aneurysm Followed by IR- Advised to schedule f/up appointment.  Severe episode of recurrent major depressive disorder, without psychotic features (HCC) Improved mood with effexor  Did not schedule appointment with counselor  OSA (obstructive sleep apnea) Persistent daytime fatigue and somnolence, worse around 9am to 11am Advised to contact cardiology for  repeat sleep study and CPAp machine  Check B12 and vit. D  Reviewed medical, surgical, and social history today  Medications: Outpatient Medications Prior to Visit  Medication Sig   apixaban  (ELIQUIS ) 5 MG TABS tablet Take 1 tablet (5 mg total) by mouth 2 (two) times daily.   aspirin  EC 81 MG tablet Take 81 mg by mouth daily. Swallow whole.   atorvastatin   (LIPITOR) 20 MG tablet Take 1 tablet (20 mg total) by mouth every evening.   Cholecalciferol (VITAMIN D -3) 25 MCG (1000 UT) CAPS Take 1,000 Units by mouth daily.   cyanocobalamin  (VITAMIN B12) 1000 MCG tablet Take 1 tablet (1,000 mcg total) by mouth daily.   famotidine  (PEPCID ) 20 MG tablet Take 1 tablet (20 mg total) by mouth daily.   flecainide  (TAMBOCOR ) 100 MG tablet Take 1 tablet (100 mg total) by mouth 2 (two) times daily.   losartan  (COZAAR ) 50 MG tablet Take 1 tablet (50 mg total) by mouth in the morning.   methylcellulose (CITRUCEL) oral powder Take 1 packet by mouth daily. 5 ml   metoprolol  succinate (TOPROL -XL) 25 MG 24 hr tablet Take 1 tablet (25 mg total) by mouth daily.   prenatal vitamin w/FE, FA (PRENATAL 1 + 1) 27-1 MG TABS tablet Take 1 tablet by mouth daily at 12 noon. iron    spironolactone  (ALDACTONE ) 25 MG tablet Take 1 tablet (25 mg total) by mouth daily.   [DISCONTINUED] venlafaxine  XR (EFFEXOR -XR) 37.5 MG 24 hr capsule TAKE 1 CAPSULE BY MOUTH DAILY WITH BREAKFAST.   No facility-administered medications prior to visit.   Reviewed past medical and social history.   ROS per HPI above  Last CBC Lab Results  Component Value Date   WBC 5.6 08/09/2023   HGB 12.4 08/09/2023   HCT 37.9 08/09/2023   MCV 86.6 08/09/2023   MCH 28.8 02/24/2023   RDW 14.2 08/09/2023   PLT 422.0 (H) 08/09/2023   Last metabolic panel Lab Results  Component  Value Date   GLUCOSE 101 (H) 05/31/2023   NA 140 05/31/2023   K 4.2 05/31/2023   CL 103 05/31/2023   CO2 29 05/31/2023   BUN 9 05/31/2023   CREATININE 0.69 05/31/2023   GFR 98.57 05/31/2023   CALCIUM  9.6 05/31/2023   PHOS 4.2 12/28/2022   PROT 7.9 11/23/2022   ALBUMIN 4.3 12/28/2022   BILITOT 0.7 11/23/2022   ALKPHOS 80 11/23/2022   AST 22 11/23/2022   ALT 17 11/23/2022   ANIONGAP 8 02/24/2023   Last lipids Lab Results  Component Value Date   CHOL 171 03/31/2023   HDL 49.80 03/31/2023   LDLCALC 101 (H) 03/31/2023    LDLDIRECT 89.0 08/09/2023   TRIG 99.0 03/31/2023   CHOLHDL 3 03/31/2023   Last hemoglobin A1c Lab Results  Component Value Date   HGBA1C 6.8 (H) 08/09/2023   Last thyroid  functions Lab Results  Component Value Date   TSH 0.53 03/31/2023   T4TOTAL 8.6 03/31/2023   Last vitamin D  No results found for: 25OHVITD2, 25OHVITD3, VD25OH Last vitamin B12 and Folate No results found for: VITAMINB12, FOLATE      Objective:  BP 138/86 (BP Location: Left Arm, Patient Position: Sitting, Cuff Size: Normal)   Pulse 80   Temp 98.3 F (36.8 C) (Oral)   Ht 5' 1 (1.549 m)   Wt 180 lb 3.2 oz (81.7 kg)   SpO2 96%   BMI 34.05 kg/m      Physical Exam Vitals and nursing note reviewed.  Cardiovascular:     Rate and Rhythm: Normal rate and regular rhythm.     Pulses: Normal pulses.     Heart sounds: Normal heart sounds.  Pulmonary:     Effort: Pulmonary effort is normal.     Breath sounds: Normal breath sounds.  Musculoskeletal:     Right lower leg: No edema.     Left lower leg: No edema.  Neurological:     Mental Status: She is alert and oriented to person, place, and time.     Results for orders placed or performed in visit on 11/10/23  POCT urinalysis dipstick  Result Value Ref Range   Color, UA Yellow    Clarity, UA Clear    Glucose, UA Negative Negative   Bilirubin, UA Negative    Ketones, UA Negative    Spec Grav, UA >=1.030 (A) 1.010 - 1.025   Blood, UA Negative    pH, UA 6.0 5.0 - 8.0   Protein, UA Positive (A) Negative   Urobilinogen, UA negative (A) 0.2 or 1.0 E.U./dL   Nitrite, UA Negative    Leukocytes, UA Negative Negative   Appearance     Odor        Assessment & Plan:    Problem List Items Addressed This Visit     DM (diabetes mellitus) (HCC)   controlled with diet BP at goal LDL not at goal, use of atorvastatin  Normal UACr No neuropathy Repeat hgbA1c, CMP, and lipid panel F/up in 6months      Relevant Orders   Hemoglobin A1c    Comprehensive metabolic panel with GFR   Essential hypertension, benign   BP at goal losartana nd spironolactone  BP Readings from Last 3 Encounters:  11/10/23 138/86  09/29/23 118/80  09/20/23 128/76    Maintain med doses Repeat CMP F/up in 3months      Relevant Orders   Comprehensive metabolic panel with GFR   Hyperlipidemia associated with type 2 diabetes mellitus (HCC)  No adverse effect with atorvastatin  Repeat lipid panel and CMP Maintain med dose      Relevant Orders   Lipid panel   Comprehensive metabolic panel with GFR   OSA (obstructive sleep apnea)   Persistent daytime fatigue and somnolence, worse around 9am to 11am Advised to contact cardiology for  repeat sleep study and CPAp machine  Check B12 and vit. D      Perimenopausal vasomotor symptoms   Persistent hot flashes, but improvement mood. Denies any adverse effects with effexor  37.5mg  XR  Increase dose to 75mg  daily Consider referral to gyn to discuss HRT if no improvement F/up in 3months      Relevant Medications   venlafaxine  XR (EFFEXOR -XR) 75 MG 24 hr capsule   Severe episode of recurrent major depressive disorder, without psychotic features (HCC) - Primary   Improved mood with effexor  Did not schedule appointment with counselor      Relevant Medications   venlafaxine  XR (EFFEXOR -XR) 75 MG 24 hr capsule   Other Relevant Orders   B12   VITAMIN D  25 Hydroxy (Vit-D Deficiency, Fractures)   Status post coil embolization of cerebral aneurysm   Followed by IR-[facility] Advised to schedule f/up appointment.      Other Visit Diagnoses       Uncontrolled daytime somnolence       Relevant Orders   B12   VITAMIN D  25 Hydroxy (Vit-D Deficiency, Fractures)     Acute midline low back pain without sciatica       Relevant Orders   POCT urinalysis dipstick (Completed)      Return in about 4 weeks (around 12/08/2023) for depression and anxiety, HTN- complete PHQ/GAD.     Roselie Mood,  NP

## 2023-11-10 NOTE — Assessment & Plan Note (Signed)
 BP at goal losartana nd spironolactone  BP Readings from Last 3 Encounters:  11/10/23 138/86  09/29/23 118/80  09/20/23 128/76    Maintain med doses Repeat CMP F/up in 3months

## 2023-11-10 NOTE — Assessment & Plan Note (Signed)
 Stable renal and liver function Improved lipid panel hgbA1c increased to 7.6% start metformin  500mg  daily. F/up in 3months

## 2023-11-10 NOTE — Assessment & Plan Note (Addendum)
 Persistent daytime fatigue and somnolence, worse around 9am to 11am Advised to contact cardiology for  repeat sleep study and CPAp machine  Check B12 and vit. D

## 2023-11-10 NOTE — Assessment & Plan Note (Signed)
 No adverse effect with atorvastatin  Repeat lipid panel and CMP Maintain med dose

## 2023-11-10 NOTE — Assessment & Plan Note (Signed)
 controlled with diet BP at goal LDL not at goal, use of atorvastatin  Normal UACr No neuropathy Repeat hgbA1c, CMP, and lipid panel F/up in 6months

## 2023-11-11 ENCOUNTER — Encounter: Payer: Self-pay | Admitting: Pulmonary Disease

## 2023-11-11 ENCOUNTER — Ambulatory Visit: Attending: Pulmonary Disease | Admitting: Pulmonary Disease

## 2023-11-11 VITALS — BP 134/78 | HR 75 | Ht 61.0 in | Wt 180.0 lb

## 2023-11-11 DIAGNOSIS — Z79899 Other long term (current) drug therapy: Secondary | ICD-10-CM | POA: Diagnosis not present

## 2023-11-11 DIAGNOSIS — I10 Essential (primary) hypertension: Secondary | ICD-10-CM

## 2023-11-11 DIAGNOSIS — D6869 Other thrombophilia: Secondary | ICD-10-CM | POA: Diagnosis not present

## 2023-11-11 DIAGNOSIS — I480 Paroxysmal atrial fibrillation: Secondary | ICD-10-CM | POA: Diagnosis not present

## 2023-11-11 DIAGNOSIS — I493 Ventricular premature depolarization: Secondary | ICD-10-CM

## 2023-11-11 DIAGNOSIS — G4733 Obstructive sleep apnea (adult) (pediatric): Secondary | ICD-10-CM

## 2023-11-11 MED ORDER — METOPROLOL SUCCINATE ER 50 MG PO TB24: 50.0000 mg | ORAL_TABLET | Freq: Every day | ORAL | 2 refills | Status: AC

## 2023-11-11 NOTE — Patient Instructions (Signed)
 Medication Instructions:  STOP Flecainide .  Call if you have recurrent symptoms of AFib and we will get you in to be seen.    Increase your Toprol  to 50 mg Daily    *If you need a refill on your cardiac medications before your next appointment, please call your pharmacy*  Lab Work: No lab work today If you have labs (blood work) drawn today and your tests are completely normal, you will receive your results only by: MyChart Message (if you have MyChart) OR A paper copy in the mail If you have any lab test that is abnormal or we need to change your treatment, we will call you to review the results.  Testing/Procedures: No testing/procedures were scheduled today  Follow-Up: At [facility], you and your health needs are our priority.  As part of our continuing mission to provide you with exceptional heart care, our providers are all part of one team.  This team includes your primary Cardiologist (physician) and Advanced Practice Providers or APPs (Physician Assistants and Nurse Practitioners) who all work together to provide you with the care you need, when you need it.  Your next appointment:   6 month(s)  Provider:   You may see Fonda Kitty, MD or one of the following Advanced Practice Providers on your designated Care Team:    Daphne Barrack, NP    We recommend signing up for the patient portal called MyChart.  Sign up information is provided on this After Visit Summary.  MyChart is used to connect with patients for Virtual Visits (Telemedicine).  Patients are able to view lab/test results, encounter notes, upcoming appointments, etc.  Non-urgent messages can be sent to your provider as well.   To learn more about what you can do with MyChart, go to [email or web address].

## 2023-11-14 ENCOUNTER — Other Ambulatory Visit: Payer: Self-pay | Admitting: Nurse Practitioner

## 2023-11-14 ENCOUNTER — Telehealth (HOSPITAL_COMMUNITY): Payer: Self-pay

## 2023-11-14 ENCOUNTER — Telehealth: Payer: Self-pay | Admitting: Nurse Practitioner

## 2023-11-14 DIAGNOSIS — E1169 Type 2 diabetes mellitus with other specified complication: Secondary | ICD-10-CM

## 2023-11-14 NOTE — Telephone Encounter (Signed)
 Emma Stephens placed a referral for Nutrition on 09/10/22 for pt.

## 2023-11-14 NOTE — Telephone Encounter (Signed)
-  Called and left a voice message per DPR on file asking to give me a call back at the office at -

## 2023-11-14 NOTE — Telephone Encounter (Signed)
 Returned the call to [name] and asked what exactly was needed. She informed me that a new referral will be needed due to patient has only been seen in their office once and that was on 08/10/23 after consisently rescheduling appointment since the referral was received in June of 2024. I informed her that I will give the patient a call to ask if this is something she is still interested in and will also pass the information to [facility] as well. She thanked em for calling and will be looking a correspondence from our office on the matter.

## 2023-11-14 NOTE — Telephone Encounter (Signed)
 Pt called to schedule f/u. Informed her that Dr. Dolphus and Dr. Delaney are no longer here. Pt would like to get a referral put in to see Dr. Lanis. AB

## 2023-11-14 NOTE — Telephone Encounter (Signed)
 Copied from CRM #[id number]. Topic: Referral - Question >> Nov 14, 2023  9:02 AM Martinique E wrote: Reason for CRM: [name] from [facility] called in stated that they need an updated referral for this patient. Callback number for [name] is [phone].

## 2023-11-15 ENCOUNTER — Other Ambulatory Visit (HOSPITAL_COMMUNITY): Payer: Self-pay | Admitting: Radiology

## 2023-11-15 ENCOUNTER — Telehealth: Payer: Self-pay

## 2023-11-15 ENCOUNTER — Encounter: Payer: Self-pay | Admitting: Cardiology

## 2023-11-15 ENCOUNTER — Ambulatory Visit: Attending: Cardiology | Admitting: Cardiology

## 2023-11-15 ENCOUNTER — Ambulatory Visit: Admitting: Dietician

## 2023-11-15 ENCOUNTER — Encounter: Attending: Nurse Practitioner | Admitting: Dietician

## 2023-11-15 ENCOUNTER — Encounter: Payer: Self-pay | Admitting: Dietician

## 2023-11-15 VITALS — BP 126/78 | HR 70 | Ht 61.0 in | Wt 179.2 lb

## 2023-11-15 DIAGNOSIS — I480 Paroxysmal atrial fibrillation: Secondary | ICD-10-CM | POA: Diagnosis not present

## 2023-11-15 DIAGNOSIS — I10 Essential (primary) hypertension: Secondary | ICD-10-CM | POA: Diagnosis not present

## 2023-11-15 DIAGNOSIS — Z9889 Other specified postprocedural states: Secondary | ICD-10-CM

## 2023-11-15 DIAGNOSIS — E1169 Type 2 diabetes mellitus with other specified complication: Secondary | ICD-10-CM | POA: Insufficient documentation

## 2023-11-15 DIAGNOSIS — G4733 Obstructive sleep apnea (adult) (pediatric): Secondary | ICD-10-CM | POA: Diagnosis not present

## 2023-11-15 DIAGNOSIS — I671 Cerebral aneurysm, nonruptured: Secondary | ICD-10-CM

## 2023-11-15 NOTE — Patient Instructions (Signed)
 Goals Established by Patient:   Goal 1: exercise (gym, walking) 3 days a week for 1 hour. - goal in progress, continue!   Goal 2: drink 2 liter bottles of water daily. - goal in progress, continue!   Goal 3: include non-starchy vegetables with lunch and dinner. - goal in progress, continue!

## 2023-11-15 NOTE — Addendum Note (Signed)
**Note De-Identified Benjamin Merrihew Obfuscation** Addended by: LAURENT AVELINA HERO on: 11/15/2023 02:55 PM   Modules accepted: Orders

## 2023-11-15 NOTE — Progress Notes (Signed)
 Date:  11/15/2023   ID:  Emma Stephens, DOB 03/26/1970, MRN [id number] PCP:  Katheen Roselie Rockford, NP  Cardiologist:  None  Sleep Medicine:  Wilbert Bihari, MD Electrophysiologist:  Fonda Kitty, MD   Chief Complaint:  OSA  History of Present Illness:    Emma Stephens is a 53 y.o. female who is being seen today for the evaluation of OSA at the request of Fonda Kitty, MD.  This is a 53yo female with a hx of PAF, PVCs and HTN.  She was referred by Dr. Fernande for evaluation of OSA due to excessive daytime sleepiness with an Epworth sleepiness score of 11 and HTN as well as PAF.  She denied ever waking up gasping for breath or snoring but was told that she snored.  She underwent home sleep study which showed severe OSA with an AHI of 68.6/hr and O2 desaturations as low as 82%.  She was started on auto CPAP but due to ongoing respiratory events BiPAP titration was recommended.  Unfortunately her insurance required her to show compliance with her CPAP before they would approve a BIPAP device and she was not compliant and had to turn her device.    At last office visit in 2021 I ordered a repeat sleep study.  She underwent a split-night sleep study with BiPAP titration 01/24/2020.  This demonstrated severe obstructive sleep apnea with an AHI 57.4/h with O2 saturation nadir 86%.  She was started on a trial of auto BiPAP with IPAP max 20 cm H2O, EPAP min 6 cm H2O and pressure support 5 cm H2O.  She was advised to follow back up with me but never followed.  She tells me that she never got the BIPAP device and that no one called her despite documentation that the device was ordered.   She is now referred for sleep medicine consultation to reestablish sleep care and treatment of obstructive sleep apnea.  She would like to consider the inspire device as she has been intolerant to PAP therapy.   Prior CV studies:   The following studies were reviewed today:  Sleep study, PAP compliance  download  Past Medical History:  Diagnosis Date   Acute gout 07/30/2014   Allergy    Anemia    Anxiety    Blood transfusion without reported diagnosis    Cigarette nicotine  dependence    DM (diabetes mellitus) (HCC) 09/10/2022   GERD (gastroesophageal reflux disease)    Gout 2011   Helicobacter pylori gastritis 02/19/2022   Hyperlipidemia associated with type 2 diabetes mellitus (HCC) 03/31/2023   Hypertension    PAF (paroxysmal atrial fibrillation) (HCC)    PVC (premature ventricular contraction)    Sleep apnea    not on cpap at this time 01-17-20   Past Surgical History:  Procedure Laterality Date   ATRIAL FIBRILLATION ABLATION N/A 06/29/2023   Procedure: ATRIAL FIBRILLATION ABLATION;  Surgeon: Kitty Fonda, MD;  Location: [facility] INVASIVE CV LAB;  Service: Cardiovascular;  Laterality: N/A;   BIOPSY  02/21/2022   Procedure: BIOPSY;  Surgeon: Rollin Dover, MD;  Location: [facility] ENDOSCOPY;  Service: Gastroenterology;;   CHOLECYSTECTOMY N/A 02/23/2022   Procedure: LAPAROSCOPIC CHOLECYSTECTOMY with Lysis of Adhessions;  Surgeon: Lyndel Deward PARAS, MD;  Location: [facility] ORS;  Service: General;  Laterality: N/A;   ESOPHAGOGASTRODUODENOSCOPY (EGD) WITH PROPOFOL  N/A 02/21/2022   Procedure: ESOPHAGOGASTRODUODENOSCOPY (EGD) WITH PROPOFOL ;  Surgeon: Rollin Dover, MD;  Location: [facility] ENDOSCOPY;  Service: Gastroenterology;  Laterality: N/A;   IR  3D INDEPENDENT WKST  02/23/2023   IR 3D INDEPENDENT WKST  02/23/2023   IR ANGIO INTRA EXTRACRAN SEL INTERNAL CAROTID BILAT MOD SED  02/23/2023   IR ANGIO VERTEBRAL SEL VERTEBRAL BILAT MOD SED  02/23/2023   IR ANGIOGRAM FOLLOW UP STUDY  02/23/2023   IR CT HEAD LTD  02/23/2023   IR TRANSCATH/EMBOLIZ  02/23/2023   IR US  GUIDE VASC ACCESS RIGHT  02/23/2023   NECK SURGERY     RADIOLOGY WITH ANESTHESIA N/A 02/23/2023   Procedure: Aneurysm embolization;  Surgeon: de Macedo Rodrigues, Katyucia, MD;  Location[facility] OR;  Service: Radiology;  Laterality: N/A;   TUBAL LIGATION        Current Meds  Medication Sig   apixaban  (ELIQUIS ) 5 MG TABS tablet Take 1 tablet (5 mg total) by mouth 2 (two) times daily.   aspirin  EC 81 MG tablet Take 81 mg by mouth daily. Swallow whole.   atorvastatin  (LIPITOR) 20 MG tablet Take 1 tablet (20 mg total) by mouth every evening.   Cholecalciferol (VITAMIN D -3) 25 MCG (1000 UT) CAPS Take 1,000 Units by mouth daily.   famotidine  (PEPCID ) 20 MG tablet Take 1 tablet (20 mg total) by mouth daily.   losartan  (COZAAR ) 50 MG tablet Take 1 tablet (50 mg total) by mouth in the morning.   metFORMIN  (GLUCOPHAGE -XR) 500 MG 24 hr tablet Take 1 tablet (500 mg total) by mouth daily with breakfast.   methylcellulose (CITRUCEL) oral powder Take 1 packet by mouth daily. 5 ml   metoprolol  succinate (TOPROL -XL) 50 MG 24 hr tablet Take 1 tablet (50 mg total) by mouth daily.   prenatal vitamin w/FE, FA (PRENATAL 1 + 1) 27-1 MG TABS tablet Take 1 tablet by mouth daily at 12 noon. iron    spironolactone  (ALDACTONE ) 25 MG tablet Take 1 tablet (25 mg total) by mouth daily.   venlafaxine  XR (EFFEXOR -XR) 75 MG 24 hr capsule Take 1 capsule (75 mg total) by mouth daily with breakfast.     Allergies:   Lisinopril, Bupropion , and Other   Social History   Tobacco Use   Smoking status: Former    Current packs/day: 0.00    Average packs/day: 0.5 packs/day for 31.0 years (15.5 ttl pk-yrs)    Types: Cigarettes    Start date: 11/03/1990    Quit date: 11/02/2021    Years since quitting: 2.0   Smokeless tobacco: Never   Tobacco comments:    Former smoker 07/27/23  Vaping Use   Vaping status: Some Days  Substance Use Topics   Alcohol use: No   Drug use: No     Family Hx: The patient's family history includes Breast cancer in her paternal grandmother; Cancer (age of onset: 7) in her father; Diabetes in her brother, mother, and sister; Hypertension in her brother, mother, and sister. There is no history of Sudden Cardiac Death, Heart attack, Colon cancer,  Esophageal cancer, Rectal cancer, or Stomach cancer.  ROS:   Please see the history of present illness.     All other systems reviewed and are negative.   Labs/Other Tests and Data Reviewed:    Recent Labs: 03/31/2023: TSH 0.53 08/09/2023: Hemoglobin 12.4; Platelets 422.0 11/10/2023: ALT 20; BUN 12; Creatinine, Ser 0.67; Potassium 4.1; Sodium 140   Recent Lipid Panel Lab Results  Component Value Date/Time   CHOL 114 11/10/2023 09:02 AM   TRIG 57.0 11/10/2023 09:02 AM   HDL 47.60 11/10/2023 09:02 AM   CHOLHDL 2 11/10/2023 09:02 AM   LDLCALC 55  11/10/2023 09:02 AM   LDLDIRECT 89.0 08/09/2023 10:20 AM    Wt Readings from Last 3 Encounters:  11/15/23 179 lb 3.2 oz (81.3 kg)  11/11/23 180 lb (81.6 kg)  11/10/23 180 lb 3.2 oz (81.7 kg)     Objective:    Vital Signs:  BP 126/78   Pulse 70   Ht 5' 1 (1.549 m)   Wt 179 lb 3.2 oz (81.3 kg)   SpO2 98%   BMI 33.86 kg/m   GEN: Well nourished, well developed in no acute distress HEENT: Normal NECK: No JVD; No carotid bruits LYMPHATICS: No lymphadenopathy CARDIAC:RRR, no murmurs, rubs, gallops RESPIRATORY:  Clear to auscultation without rales, wheezing or rhonchi  ABDOMEN: Soft, non-tender, non-distended MUSCULOSKELETAL:  No edema; No deformity  SKIN: Warm and dry NEUROLOGIC:  Alert and oriented x 3 PSYCHIATRIC:  Normal affect   ASSESSMENT & PLAN:    OSA  -She was not compliant with her last CPAP device because it had to be set at a high setting and still was not controlling her apneas. She could not tolerate the high pressure. -unfortunately her insurance would not work with her and wanted her compliant on CPAP before going to BIPAP and since she could not tolerate CPAP she was not compliant. -repeat split-night sleep study in 2021 showed severe obstructive sleep apnea with an AHI 57.4/h with O2 saturation nadir 86%.  She was started on a trial of auto BiPAP with IPAP max 20 cm H2O, EPAP min 6 cm H2O and pressure support 5 cm  H2O -She was lost to follow-up and now is here to discuss possible inspire device -She will need a repeat in-lab PSG to document OSA with AHI>15.  If found to be a candidate based on AHI then will refer to Dr. Carlie for workup for inspire device  HTN - BP controlled on exam today  - Continue losartan  50 mg daily and Toprol  XL 50 mg daily as well as Aldactone  25 mg daily with as needed refills   Medication Adjustments/Labs and Tests Ordered: Current medicines are reviewed at length with the patient today.  Concerns regarding medicines are outlined above.  Tests Ordered: No orders of the defined types were placed in this encounter.  Medication Changes: No orders of the defined types were placed in this encounter.   Disposition:  followup after sleep study  Signed, Wilbert Bihari, MD  11/15/2023 11:27 AM    [facility]

## 2023-11-15 NOTE — Progress Notes (Signed)
 Diabetes Self-Management Education  Visit Type: Follow-up  Appt. Start Time: 1145 Appt. End Time: 1215  11/15/2023  [name], identified by name and date of birth, is a 53 y.o. female with a diagnosis of Diabetes: type 2  .   ASSESSMENT  History includes: type 2 diabetes, anemia, anxiety, GERD, sleep apnea, HLD, HTN Labs noted: 11/10/23 A1c 7.6% Medications include: reviewed; metformin  Supplements: MVI, vitamin b12, vitamin d3   Pt reports she is starting to learn to like other things. Pt reports she tried a yogurt parfait from [employer] and likes the yogurt as long as she has extra granola in it.   Pt states she started metformin  3 days ago and has had no issues.   Pt states she went through a depressive episode. Pt reports prior to this she was drinking 2L water daily but fell out of the habit.   Pt states she is getting a sleep study done for her sleep apnea.   Pt reports she would like to continue her goals from her previous visit.   Assessment of Previous Goals Established by Patient:   Goal 1: exercise (gym, walking) 3 days a week for 1 hour. - goal in progress, continue!   Goal 2: drink 2 liter bottles of water daily. - goal in progress, continue!   Goal 3: include non-starchy vegetables with lunch and dinner. - goal in progress, continue!  There were no vitals taken for this visit. There is no height or weight on file to calculate BMI.   Diabetes Self-Management Education - 11/15/23 1149       Visit Information   Visit Type Follow-up      Health Coping   How would you rate your overall health? Good      Psychosocial Assessment   Patient Belief/Attitude about Diabetes Motivated to manage diabetes    What is the hardest part about your diabetes right now, causing you the most concern, or is the most worrisome to you about your diabetes?   Making healty food and beverage choices    Self-care barriers None    Self-management support Doctor's office     Other persons present Patient    Patient Concerns Nutrition/Meal planning    Special Needs None    Preferred Learning Style No preference indicated    Learning Readiness Ready      Pre-Education Assessment   Patient understands the diabetes disease and treatment process. Needs Review    Patient understands incorporating nutritional management into lifestyle. Needs Review    Patient undertands incorporating physical activity into lifestyle. Needs Review    Patient understands using medications safely. Needs Review    Patient understands monitoring blood glucose, interpreting and using results Needs Review    Patient understands prevention, detection, and treatment of acute complications. Needs Review    Patient understands prevention, detection, and treatment of chronic complications. Needs Review    Patient understands how to develop strategies to address psychosocial issues. Needs Review    Patient understands how to develop strategies to promote health/change behavior. Needs Review      Complications   Last HgB A1C per patient/outside source 7.6 %      Dietary Intake   Breakfast protein bar    Snack (morning) popcorn    Lunch leftovers    Snack (afternoon) none    Dinner salmon and shrimp and asparagus and garlic sauce over angel hair    Snack (evening) none    Beverage(s) water, some soda  Activity / Exercise   Activity / Exercise Type ADL's      Patient Education   Previous Diabetes Education Yes    Disease Pathophysiology Explored patient's options for treatment of their diabetes    Healthy Eating Role of diet in the treatment of diabetes and the relationship between the three main macronutrients and blood glucose level;Plate Method;Meal options for control of blood glucose level and chronic complications.;Information on hints to eating out and maintain blood glucose control.    Being Active Role of exercise on diabetes management, blood pressure control and cardiac  health.;Helped patient identify appropriate exercises in relation to his/her diabetes, diabetes complications and other health issue.    Medications Reviewed patients medication for diabetes, action, purpose, timing of dose and side effects.    Chronic complications Relationship between chronic complications and blood glucose control;Identified and discussed with patient  current chronic complications    Diabetes Stress and Support Worked with patient to identify barriers to care and solutions;Identified and addressed patients feelings and concerns about diabetes    Lifestyle and Health Coping Lifestyle issues that need to be addressed for better diabetes care      Individualized Goals (developed by patient)   Nutrition General guidelines for healthy choices and portions discussed    Physical Activity Exercise 3-5 times per week;60 minutes per day    Medications take my medication as prescribed    Monitoring  Test my blood glucose as discussed    Problem Solving Eating Pattern    Reducing Risk examine blood glucose patterns;do foot checks daily;treat hypoglycemia with 15 grams of carbs if blood glucose less than 70mg /dL    Health Coping Ask for help with psychological, social, or emotional issues      Patient Self-Evaluation of Goals - Patient rates self as meeting previously set goals (% of time)   Nutrition 50 - 75 % (half of the time)    Physical Activity 50 - 75 % (half of the time)    Medications >75% (most of the time)    Monitoring Not Applicable    Problem Solving and behavior change strategies  50 - 75 % (half of the time)    Reducing Risk (treating acute and chronic complications) 50 - 75 % (half of the time)    Health Coping 50 - 75 % (half of the time)      Post-Education Assessment   Patient understands the diabetes disease and treatment process. Comprehends key points    Patient understands incorporating nutritional management into lifestyle. Comprehends key points    Patient  undertands incorporating physical activity into lifestyle. Comprehends key points    Patient understands using medications safely. Comphrehends key points    Patient understands monitoring blood glucose, interpreting and using results Comprehends key points    Patient understands prevention, detection, and treatment of acute complications. Comprehends key points    Patient understands prevention, detection, and treatment of chronic complications. Comprehends key points    Patient understands how to develop strategies to address psychosocial issues. Comprehends key points    Patient understands how to develop strategies to promote health/change behavior. Comprehends key points      Outcomes   Expected Outcomes Demonstrated interest in learning. Expect positive outcomes    Future DMSE 3-4 months    Program Status Not Completed      Subsequent Visit   Since your last visit have you continued or begun to take your medications as prescribed? Yes    Since your  last visit have you experienced any weight changes? Loss    Weight Loss (lbs) 2          Individualized Plan for Diabetes Self-Management Training:   Learning Objective:  Patient will have a greater understanding of diabetes self-management. Patient education plan is to attend individual and/or group sessions per assessed needs and concerns.   Plan:   Patient Instructions  Goals Established by Patient:   Goal 1: exercise (gym, walking) 3 days a week for 1 hour. - goal in progress, continue!   Goal 2: drink 2 liter bottles of water daily. - goal in progress, continue!   Goal 3: include non-starchy vegetables with lunch and dinner. - goal in progress, continue!  Expected Outcomes:  Demonstrated interest in learning. Expect positive outcomes  Education material provided: no handouts on this follow up  If problems or questions, patient to contact team via:  Phone  Future DSME appointment: 3-4 months

## 2023-11-15 NOTE — Telephone Encounter (Signed)
**Note De-Identified Javar Eshbach Obfuscation** Per the [facility] Provider Portal, a PA is not required for a NPSG.  I have advised the pt that a PA is not required for her NPSG and I gave her the [facility]'s phone number so she can call them to schedule. I have transferred the order to the Sleep [facility].

## 2023-11-16 NOTE — Telephone Encounter (Signed)
 Patient was called and she informed me that she is scheduled to follow up in 3 months and if a referral is needed to yes please send.

## 2023-11-30 DIAGNOSIS — Z01419 Encounter for gynecological examination (general) (routine) without abnormal findings: Secondary | ICD-10-CM | POA: Diagnosis not present

## 2023-12-02 ENCOUNTER — Other Ambulatory Visit: Payer: Self-pay | Admitting: Nurse Practitioner

## 2023-12-02 DIAGNOSIS — N951 Menopausal and female climacteric states: Secondary | ICD-10-CM

## 2023-12-02 DIAGNOSIS — F332 Major depressive disorder, recurrent severe without psychotic features: Secondary | ICD-10-CM

## 2023-12-13 ENCOUNTER — Ambulatory Visit (INDEPENDENT_AMBULATORY_CARE_PROVIDER_SITE_OTHER): Admitting: Nurse Practitioner

## 2023-12-13 ENCOUNTER — Other Ambulatory Visit (HOSPITAL_COMMUNITY)
Admission: RE | Admit: 2023-12-13 | Discharge: 2023-12-13 | Disposition: A | Discharge status: Home / Self Care | Source: Ambulatory Visit | Attending: Nurse Practitioner | Admitting: Nurse Practitioner

## 2023-12-13 ENCOUNTER — Encounter: Payer: Self-pay | Admitting: Nurse Practitioner

## 2023-12-13 VITALS — BP 124/76 | HR 86 | Temp 98.5°F | Ht 61.0 in | Wt 180.8 lb

## 2023-12-13 DIAGNOSIS — F331 Major depressive disorder, recurrent, moderate: Secondary | ICD-10-CM

## 2023-12-13 DIAGNOSIS — N951 Menopausal and female climacteric states: Secondary | ICD-10-CM

## 2023-12-13 DIAGNOSIS — N898 Other specified noninflammatory disorders of vagina: Secondary | ICD-10-CM | POA: Insufficient documentation

## 2023-12-13 MED ORDER — VENLAFAXINE HCL ER 75 MG PO CP24: 75.0000 mg | ORAL_CAPSULE | Freq: Every day | ORAL | 1 refills | Status: AC

## 2023-12-13 NOTE — Progress Notes (Signed)
 Established Patient Visit  Patient: Emma Stephens   DOB: [birth year]   53 y.o. Female  MRN: [id number] Visit Date: 12/13/2023  Subjective:    Chief Complaint  Patient presents with   Follow-up    4 week follow up for Depression/Anxiety    Vaginal Itching The patient's primary symptoms include genital itching. The patient's pertinent negatives include no genital lesions, genital odor, genital rash, missed menses, pelvic pain, vaginal bleeding or vaginal discharge. This is a new problem. The current episode started in the past 7 days. The problem occurs constantly. The problem has been unchanged. The patient is experiencing no pain. The problem affects both sides. She is not pregnant. Pertinent negatives include no chills, constipation, dysuria, flank pain, frequency, hematuria, painful intercourse, rash or urgency. Exacerbated by: use of estradiol cream x 1week. She has tried nothing for the symptoms. She is sexually active. No, her partner does not have an STD. She uses nothing (postmenopause) for contraception. She is postmenopausal. There is no history of herpes simplex, PID, an STD or vaginosis.   Depression, recurrent Improved mood with effexor  No adverse effects  Maintain med dose F/up in 3months     12/13/2023    8:40 AM 09/20/2023   11:10 AM 08/10/2023    9:51 AM  Depression screen PHQ 2/9  Decreased Interest 1 3 1   Down, Depressed, Hopeless 1 2 1   PHQ - 2 Score 2 5 2   Altered sleeping 0 3   Tired, decreased energy 2 3   Change in appetite 1 2   Feeling bad or failure about yourself  0 2   Trouble concentrating 2 3   Moving slowly or fidgety/restless 1 1   Suicidal thoughts 0 0   PHQ-9 Score 8 19   Difficult doing work/chores Somewhat difficult Somewhat difficult        12/13/2023    8:41 AM 09/20/2023   11:10 AM 07/02/2022   10:30 AM 12/25/2021   11:26 AM  GAD 7 : Generalized Anxiety Score  Nervous, Anxious, on Edge 0 2 0 0  Control/stop worrying 0 2 1  0  Worry too much - different things 2 2 1 1   Trouble relaxing 3 3 2 1   Restless 1 2 0 0  Easily annoyed or irritable 0 2 2 1   Afraid - awful might happen 1 1 2  0  Total GAD 7 Score 7 14 8 3   Anxiety Difficulty Somewhat difficult Somewhat difficult Somewhat difficult Not difficult at all     Reviewed medical, surgical, and social history today  Medications: Outpatient Medications Prior to Visit  Medication Sig   apixaban  (ELIQUIS ) 5 MG TABS tablet Take 1 tablet (5 mg total) by mouth 2 (two) times daily.   aspirin  EC 81 MG tablet Take 81 mg by mouth daily. Swallow whole.   atorvastatin  (LIPITOR) 20 MG tablet Take 1 tablet (20 mg total) by mouth every evening.   Cholecalciferol (VITAMIN D -3) 25 MCG (1000 UT) CAPS Take 1,000 Units by mouth daily.   estradiol (ESTRACE) 0.1 MG/GM vaginal cream Place 1 Applicatorful vaginally at bedtime.   famotidine  (PEPCID ) 20 MG tablet Take 1 tablet (20 mg total) by mouth daily.   losartan  (COZAAR ) 50 MG tablet Take 1 tablet (50 mg total) by mouth in the morning.   metFORMIN  (GLUCOPHAGE -XR) 500 MG 24 hr tablet Take 1 tablet (500 mg total) by mouth daily with breakfast.  methylcellulose (CITRUCEL) oral powder Take 1 packet by mouth daily. 5 ml   metoprolol  succinate (TOPROL -XL) 50 MG 24 hr tablet Take 1 tablet (50 mg total) by mouth daily.   prenatal vitamin w/FE, FA (PRENATAL 1 + 1) 27-1 MG TABS tablet Take 1 tablet by mouth daily at 12 noon. iron    spironolactone  (ALDACTONE ) 25 MG tablet Take 1 tablet (25 mg total) by mouth daily.   [DISCONTINUED] venlafaxine  XR (EFFEXOR -XR) 75 MG 24 hr capsule TAKE 1 CAPSULE BY MOUTH DAILY WITH BREAKFAST.   No facility-administered medications prior to visit.   Reviewed past medical and social history.   ROS per HPI above      Objective:  BP 124/76 (BP Location: Left Arm, Patient Position: Sitting, Cuff Size: Large)   Pulse 86   Temp 98.5 F (36.9 C) (Oral)   Ht 5' 1 (1.549 m)   Wt 180 lb 12.8 oz (82 kg)    SpO2 97%   BMI 34.16 kg/m      Physical Exam Vitals and nursing note reviewed.  Cardiovascular:     Rate and Rhythm: Normal rate.     Pulses: Normal pulses.  Pulmonary:     Effort: Pulmonary effort is normal.  Neurological:     Mental Status: She is alert and oriented to person, place, and time.  Psychiatric:        Attention and Perception: Attention normal.        Mood and Affect: Mood normal.        Speech: Speech normal.        Behavior: Behavior is cooperative.        Thought Content: Thought content normal.        Cognition and Memory: Cognition and memory normal.        Judgment: Judgment normal.     No results found for any visits on 12/13/23.    Assessment & Plan:    Problem List Items Addressed This Visit     Depression, recurrent - Primary   Improved mood with effexor  No adverse effects  Maintain med dose F/up in 3months      Relevant Medications   venlafaxine  XR (EFFEXOR -XR) 75 MG 24 hr capsule (Start on 03/02/2024)   Perimenopausal vasomotor symptoms   Relevant Medications   venlafaxine  XR (EFFEXOR -XR) 75 MG 24 hr capsule (Start on 03/02/2024)   Other Visit Diagnoses       Vaginal itching       Relevant Orders   Cervicovaginal ancillary only( [place])     Advised to hold estradiol cream at this time.  Return in about 3 months (around 03/13/2024) for HTN, DM, depression and anxiety, hyperlipidemia (fasting).     Roselie Mood, NP

## 2023-12-13 NOTE — Patient Instructions (Addendum)
 Maintain current med dose Hold estradiol vaginal cream x 1week to see if vaginal itching resolves

## 2023-12-13 NOTE — Assessment & Plan Note (Signed)
 Improved mood with effexor  No adverse effects  Maintain med dose F/up in 3months

## 2023-12-14 ENCOUNTER — Ambulatory Visit: Payer: Self-pay | Admitting: Nurse Practitioner

## 2023-12-14 DIAGNOSIS — B3731 Acute candidiasis of vulva and vagina: Secondary | ICD-10-CM

## 2023-12-14 LAB — CERVICOVAGINAL ANCILLARY ONLY
Bacterial Vaginitis (gardnerella): NEGATIVE
Candida Glabrata: NEGATIVE
Candida Vaginitis: POSITIVE — AB
Comment: NEGATIVE
Comment: NEGATIVE
Comment: NEGATIVE

## 2023-12-14 MED ORDER — FLUCONAZOLE 150 MG PO TABS: 150.0000 mg | ORAL_TABLET | Freq: Every day | ORAL | 0 refills | Status: DC

## 2023-12-21 ENCOUNTER — Telehealth (HOSPITAL_COMMUNITY): Payer: Self-pay | Admitting: Radiology

## 2023-12-21 ENCOUNTER — Other Ambulatory Visit (HOSPITAL_COMMUNITY): Payer: Self-pay | Admitting: Radiology

## 2023-12-21 DIAGNOSIS — I671 Cerebral aneurysm, nonruptured: Secondary | ICD-10-CM

## 2023-12-21 NOTE — Telephone Encounter (Signed)
 Pt called and has asked for a referral to Dr. Nancyann Burns now. I will put in a referral for her. She was appreciative for the referral. She said she had initially requested to see Dr. Lanis but has since decided on Dr. Burns. JM

## 2023-12-22 ENCOUNTER — Encounter: Payer: Self-pay | Admitting: Neuroradiology

## 2023-12-22 ENCOUNTER — Ambulatory Visit (INDEPENDENT_AMBULATORY_CARE_PROVIDER_SITE_OTHER): Admitting: Neuroradiology

## 2023-12-22 VITALS — BP 125/77 | HR 78 | Ht 61.0 in | Wt 178.6 lb

## 2023-12-22 DIAGNOSIS — Z95828 Presence of other vascular implants and grafts: Secondary | ICD-10-CM | POA: Diagnosis not present

## 2023-12-22 DIAGNOSIS — Z7901 Long term (current) use of anticoagulants: Secondary | ICD-10-CM

## 2023-12-22 DIAGNOSIS — I671 Cerebral aneurysm, nonruptured: Secondary | ICD-10-CM

## 2023-12-22 NOTE — Progress Notes (Signed)
 I am inheriting this nice lady from Dr. Evern.  Briefly, she had a 6 mm left paraophthalmic aneurysm treated with pipeline on 02/23/2023.  There was an additional small aneurysm, both of which were covered by the device.  The aneurysm was discovered during evaluation for transient left-sided weakness.  She was found to have atrial fibrillation.  She was initially treated with aspirin  and ticagrelor  for a month or 2, and then switched back to apixaban  and aspirin .  She is not having any symptoms concerning for stroke, TIA or amaurosis fugax.  I reviewed the above imaging studies.  Blood pressure is 125/77, heart rate is 78 and regular.  Her lungs are clear.  Heart sounds are normal.  Mallampati is 2, ASA is 2  Assessment:  Previous treatment of left internal carotid artery aneurysms with flow diversion on February 23, 2023.  She needs a follow-up arteriogram to assess the treatment response.  I will check with her cardiologist to see if we can safely stop the Eliquis  for a couple of days before the single-vessel arteriogram.  She also had questions about the need for family screening or ongoing surveillance for herself.  I have explained that as she is the only family member with the aneurysms, screening of her children is not necessary.  I have also explained that I do not think routine screening of her arteries is needed after we have completed the follow-up for the internal carotid artery aneurysms.  Billing:  I spent more than 30 minutes with the review of her history and imaging studies and consultation with her and her husband.

## 2023-12-29 ENCOUNTER — Other Ambulatory Visit: Payer: Self-pay

## 2023-12-29 DIAGNOSIS — I671 Cerebral aneurysm, nonruptured: Secondary | ICD-10-CM

## 2024-01-02 ENCOUNTER — Other Ambulatory Visit: Payer: Self-pay

## 2024-01-02 ENCOUNTER — Other Ambulatory Visit: Payer: Self-pay | Admitting: Neuroradiology

## 2024-01-02 ENCOUNTER — Ambulatory Visit (HOSPITAL_COMMUNITY)
Admission: RE | Admit: 2024-01-02 | Discharge: 2024-01-02 | Disposition: A | Discharge status: Home / Self Care | Source: Ambulatory Visit | Attending: Neuroradiology | Admitting: Neuroradiology

## 2024-01-02 DIAGNOSIS — I671 Cerebral aneurysm, nonruptured: Secondary | ICD-10-CM

## 2024-01-02 HISTORY — PX: IR ANGIO INTRA EXTRACRAN SEL COM CAROTID INNOMINATE UNI L MOD SED: IMG5358

## 2024-01-02 HISTORY — PX: IR US GUIDE VASC ACCESS RIGHT: IMG2390

## 2024-01-02 LAB — GLUCOSE, CAPILLARY: Glucose-Capillary: 130 mg/dL — ABNORMAL HIGH (ref 70–99)

## 2024-01-02 MED ORDER — ACETAMINOPHEN 325 MG PO TABS
650.0000 mg | ORAL_TABLET | Freq: Once | ORAL | Status: AC
Start: 2024-01-02 — End: 2024-01-02
  Administered 2024-01-02: 650 mg via ORAL
  Filled 2024-01-02: qty 2

## 2024-01-02 MED ORDER — MIDAZOLAM HCL 2 MG/2ML IJ SOLN
INTRAMUSCULAR | Status: AC
  Filled 2024-01-02: qty 2

## 2024-01-02 MED ORDER — LIDOCAINE HCL 1 % IJ SOLN
20.0000 mL | Freq: Once | INTRAMUSCULAR | Status: AC
  Administered 2024-01-02: 2 mL via INTRADERMAL

## 2024-01-02 MED ORDER — LIDOCAINE HCL 1 % IJ SOLN
INTRAMUSCULAR | Status: AC
  Filled 2024-01-02: qty 20

## 2024-01-02 MED ORDER — BUTALBITAL-APAP-CAFFEINE 50-325-40 MG PO TABS
2.0000 | ORAL_TABLET | Freq: Once | ORAL | Status: AC
  Administered 2024-01-02: 2 via ORAL

## 2024-01-02 MED ORDER — FENTANYL CITRATE (PF) 100 MCG/2ML IJ SOLN
INTRAMUSCULAR | Status: AC
  Filled 2024-01-02: qty 2

## 2024-01-02 MED ORDER — SODIUM CHLORIDE 0.9 % IV SOLN: INTRAVENOUS | Status: DC

## 2024-01-02 MED ORDER — MIDAZOLAM HCL 2 MG/2ML IJ SOLN
INTRAMUSCULAR | Status: AC | PRN
Start: 2024-01-02 — End: 2024-01-02
  Administered 2024-01-02: 1 mg via INTRAVENOUS

## 2024-01-02 MED ORDER — FENTANYL CITRATE (PF) 100 MCG/2ML IJ SOLN
INTRAMUSCULAR | Status: AC | PRN
  Administered 2024-01-02: 50 ug via INTRAVENOUS

## 2024-01-02 MED ORDER — NITROGLYCERIN 1 MG/10 ML FOR IR/CATH LAB
INTRA_ARTERIAL | Status: AC
Start: 2024-01-02 — End: 2024-01-02
  Filled 2024-01-02: qty 10

## 2024-01-02 MED ORDER — VERAPAMIL HCL 2.5 MG/ML IV SOLN
INTRAVENOUS | Status: AC
  Filled 2024-01-02: qty 2

## 2024-01-02 MED ORDER — HEPARIN SODIUM (PORCINE) 1000 UNIT/ML IJ SOLN
INTRAMUSCULAR | Status: AC
  Filled 2024-01-02: qty 10

## 2024-01-02 MED ORDER — VERAPAMIL HCL 2.5 MG/ML IV SOLN: INTRA_ARTERIAL | Status: AC | PRN

## 2024-01-02 MED ORDER — IOHEXOL 300 MG/ML  SOLN
150.0000 mL | Freq: Once | INTRAMUSCULAR | Status: AC | PRN
Start: 2024-01-02 — End: 2024-01-02
  Administered 2024-01-02: 20 mL via INTRA_ARTERIAL

## 2024-01-02 MED ORDER — HEPARIN SODIUM (PORCINE) 1000 UNIT/ML IJ SOLN
INTRAMUSCULAR | Status: AC | PRN
  Administered 2024-01-02: 3000 [IU] via INTRAVENOUS

## 2024-01-02 NOTE — Progress Notes (Signed)
 Feels well. Physical exam WNL as noted above. Plan for cerebral angiogram right radial access.

## 2024-01-02 NOTE — Discharge Instructions (Signed)

## 2024-01-02 NOTE — CV Procedure (Signed)
  NEUROSURGERY BRIEF OP NOTE   PREOP DX: Follow up left ICA aneurysm  POSTOP DX: Same  PROCEDURE: same  SURGEON: Nancyann LULLA Burns   ANESTHESIA: IV Sedation with Local  EBL: less than 50  COMPLICATIONS: None  CONDITION: Stable to recovery  FINDINGS (Full report in [facility]): 1. No residual aneurysm   Nancyann LULLA Burns  @today @ 8:59 AM

## 2024-01-02 NOTE — Progress Notes (Signed)
 Her cerebral arteriogram was uneventful.  She describes seeing a kaleidoscope in her right visual field followed by a severe piercing left-sided headache.  Her visual fields are full.  Language function is normal.  Her strength and sensation are normal and symmetric.  She is not having any back pain.  No chest or abdominal pain.  No shortness of breath.  She does have a past history of migraines.  Assessment:  Most likely a migraine headache.  There are no neurologic changes to suggest anything more significant.

## 2024-01-02 NOTE — Sedation Documentation (Signed)
 Patient transported to recovery area via stretcher. Bedside report given to RN. Radial site assessed - Level 0, no hematoma, dressing is clean, dry, and intact. Pulses also assessed bilaterally.

## 2024-01-02 NOTE — Progress Notes (Signed)
 Discharge instructions reviewed with patient and husband Christ at the bedside. Denies questions or concerns. TR Band removed no s/s of complications at incision site. PT ambulated in the hallway to the bathroom was able to void without difficulty. PT did develop a headache. MD seen at bedside prescribed migraine medication pt states relief obtained. Escorted from the unit via wheelchair to personal vehicle.

## 2024-01-02 NOTE — Progress Notes (Signed)
 PT complained of headache 8/10 that is new onset. MD notified, tylenol  prescribed. MD states he will evaluate at bedside.

## 2024-01-04 ENCOUNTER — Encounter: Payer: Self-pay | Admitting: Nurse Practitioner

## 2024-01-04 ENCOUNTER — Ambulatory Visit: Admitting: Nurse Practitioner

## 2024-01-04 VITALS — BP 128/68 | HR 89 | Temp 98.0°F | Ht 61.0 in | Wt 182.2 lb

## 2024-01-04 DIAGNOSIS — G4733 Obstructive sleep apnea (adult) (pediatric): Secondary | ICD-10-CM

## 2024-01-04 DIAGNOSIS — Z7984 Long term (current) use of oral hypoglycemic drugs: Secondary | ICD-10-CM | POA: Diagnosis not present

## 2024-01-04 DIAGNOSIS — E785 Hyperlipidemia, unspecified: Secondary | ICD-10-CM

## 2024-01-04 DIAGNOSIS — E1169 Type 2 diabetes mellitus with other specified complication: Secondary | ICD-10-CM

## 2024-01-04 MED ORDER — RYBELSUS 3 MG PO TABS: 3.0000 mg | ORAL_TABLET | Freq: Every day | ORAL | 5 refills | Status: DC

## 2024-01-04 NOTE — Patient Instructions (Addendum)
 Maintain upcoming appointment.

## 2024-01-04 NOTE — Assessment & Plan Note (Signed)
 She requested to switch metformin  to a GLP-1RA which will help with glucose control and weight loss. She does not want to use oxempic or mounjaro because they are injectables. She is interested in use of rybelsus. We discuss potential side effects and contraindications for GLP-1RA. She verbalized understanding and agreed to start rybelsus. Prescription sent D/c metformin  Maintain upcoming appointment in December.

## 2024-01-04 NOTE — Progress Notes (Signed)
 Established Patient Visit  Patient: Emma Stephens   DOB: 08/18/1970   53 y.o. Female  MRN: [id number] Visit Date: 01/04/2024  Subjective:    Chief Complaint  Patient presents with   Medication Management    Discuss switching Metformin  to [facility]    HPI DM (diabetes mellitus) (HCC) She requested to switch metformin  to a GLP-1RA which will help with glucose control and weight loss. She does not want to use oxempic or mounjaro because they are injectables. She is interested in use of rybelsus. We discuss potential side effects and contraindications for GLP-1RA. She verbalized understanding and agreed to start rybelsus. Prescription sent D/c metformin  Maintain upcoming appointment in December.   Reviewed medical, surgical, and social history today  Medications: Outpatient Medications Prior to Visit  Medication Sig   apixaban  (ELIQUIS ) 5 MG TABS tablet Take 1 tablet (5 mg total) by mouth 2 (two) times daily.   aspirin  EC 81 MG tablet Take 81 mg by mouth daily. Swallow whole.   atorvastatin  (LIPITOR) 20 MG tablet Take 1 tablet (20 mg total) by mouth every evening.   Cholecalciferol (VITAMIN D -3) 25 MCG (1000 UT) CAPS Take 1,000 Units by mouth daily.   estradiol (ESTRACE) 0.1 MG/GM vaginal cream Place 1 Applicatorful vaginally at bedtime.   famotidine  (PEPCID ) 20 MG tablet Take 1 tablet (20 mg total) by mouth daily.   losartan  (COZAAR ) 50 MG tablet Take 1 tablet (50 mg total) by mouth in the morning.   metFORMIN  (GLUCOPHAGE -XR) 500 MG 24 hr tablet Take 1 tablet (500 mg total) by mouth daily with breakfast.   methylcellulose (CITRUCEL) oral powder Take 1 packet by mouth daily. 5 ml   metoprolol  succinate (TOPROL -XL) 50 MG 24 hr tablet Take 1 tablet (50 mg total) by mouth daily.   prenatal vitamin w/FE, FA (PRENATAL 1 + 1) 27-1 MG TABS tablet Take 1 tablet by mouth daily at 12 noon. iron    spironolactone  (ALDACTONE ) 25 MG tablet Take 1 tablet (25 mg total) by mouth  daily.   [START ON 03/02/2024] venlafaxine  XR (EFFEXOR -XR) 75 MG 24 hr capsule Take 1 capsule (75 mg total) by mouth daily with breakfast.   [DISCONTINUED] fluconazole  (DIFLUCAN ) 150 MG tablet Take 1 tablet (150 mg total) by mouth daily. Take second tab 3days apart from first tab   No facility-administered medications prior to visit.   Reviewed past medical and social history.   ROS per HPI above      Objective:  BP 128/68 (BP Location: Left Arm, Patient Position: Sitting, Cuff Size: Normal)   Pulse 89   Temp 98 F (36.7 C) (Oral)   Ht 5' 1 (1.549 m)   Wt 182 lb 3.2 oz (82.6 kg)   SpO2 97%   BMI 34.43 kg/m      Physical Exam Vitals and nursing note reviewed.  Cardiovascular:     Rate and Rhythm: Normal rate.     Pulses: Normal pulses.  Pulmonary:     Effort: Pulmonary effort is normal.  Musculoskeletal:     Right lower leg: No edema.     Left lower leg: No edema.  Neurological:     Mental Status: She is oriented to person, place, and time.     No results found for any visits on 01/04/24.    Assessment & Plan:    Problem List Items Addressed This Visit     DM (diabetes mellitus) (HCC) - Primary  She requested to switch metformin  to a GLP-1RA which will help with glucose control and weight loss. She does not want to use oxempic or mounjaro because they are injectables. She is interested in use of rybelsus. We discuss potential side effects and contraindications for GLP-1RA. She verbalized understanding and agreed to start rybelsus. Prescription sent D/c metformin  Maintain upcoming appointment in December.      Relevant Medications   Semaglutide (RYBELSUS) 3 MG TABS   Hyperlipidemia associated with type 2 diabetes mellitus (HCC)   Relevant Medications   Semaglutide (RYBELSUS) 3 MG TABS   OSA (obstructive sleep apnea)   Relevant Medications   Semaglutide (RYBELSUS) 3 MG TABS   No follow-ups on file.     Roselie Mood, NP

## 2024-01-04 NOTE — Progress Notes (Deleted)
 Emma Stephens [id number] May 07, 1970   Chief Complaint:  Referring Provider: Katheen Roselie Rockford, NP Primary GI MD: Dr. Wilhelmenia  HPI: Emma Stephens is a 53 y.o. female with past medical history of anxiety/depression, anemia, T2DM, GERD, H. pylori gastritis 2023, HLD, HTN, PAF s/p ablation 06/29/2023, intracranial aneurysm s/p stent 02/2023, sleep apnea, cholecystectomy who presents today for a complaint of *** .    On 02/20/2022 Dr. Curtis on-call was consulted to see patient for epigastric pain and abnormal CT scan.A CT scan of the abdomen was performed and a thickening in was noted in the antrum. There was an associated stranding and some free fluid in the abdomen. She has a history of GERD and one month ago it was very severe. Prior to that time she was taking pantoprazole  QAM for mild symptoms. Another medication was prescribed, in addition to the pantoprazole , but her GERD remitted spontaneously.   Last seen in office 05/31/2023 by Cathryne May, NP for complaint of lower abdominal pain and changes in bowel habits.  Had been previously treated for H. pylori and eradication test done and negative 04/2022.  Endorsed some dark stools on iron .  Discussed adding bile acid sequestrant for possible bile salt diarrhea but patient declined due to concerns it would cause constipation.  Advised to try OTC fiber, and consideration for pelvic floor physical therapy.  Was to schedule colonoscopy pending workup with cardiology.  Was scheduled for cardioversion the following month.  Labs at that time were normal with no evidence of anemia.  Cardiology 11/11/2023.  Noted to have been seen in the AF clinic on 07/27/2023 and flecainide  was stopped.  Patient had recurrent A-fib and she was restarted.  Had, flecainide  after her ablation and had initially short bursts of A-fib, then had an episode lasting 2-1/2 days.  On flecainide  for almost a month at last visit and had no AF episodes.  Compliant with  Eliquis .   Discussed the use of AI scribe software for clinical note transcription with the patient, who gave verbal consent to proceed.  History of Present Illness       Previous GI Procedures/Imaging   01/25/23 CT A/P W Contrast IMPRESSION: 1. No acute intra-abdominal process. 2. 4.5 cm simple appearing cyst in the left ovary. Follow-up pelvic ultrasound in 6-12 months is recommended. 3.  Aortic Atherosclerosis (ICD10-I70.0).  05/14/22 H pylori antigen stool negative   02/19/22 CT A/P W Contrast IMPRESSION: 1. Thickened, edematous appearance of the gastric antrum with mild adjacent fat stranding and trace free fluid. Findings are suggestive of gastritis and/or peptic ulcer disease. Somewhat asymmetric appearance of the mucosal edema along the lesser curvature may be reactive. Underlying neoplasm would be difficult to exclude. Close clinical follow-up as well as a follow-up endoscopy are recommended. 2. Colonic diverticulosis without evidence of acute diverticulitis. 3. Benign 1.9 cm hepatic hemangioma. 4. Aortic atherosclerosis (ICD10-I70.0).  02/21/2022 upper endoscopy with Dr. Rollin Impression:  - Normal esophagus.  - Gastritis. Biopsied.  - Normal examined duodenum. Path: A.   GASTRIC BIOPSY:  -    Active chronic gastritis with associated Helicobacter pylori  infection (H. pylori immunohistochemical (IHC) stain examined, with  satisfactory controls).  -    Negative for intestinal metaplasia and malignancy.   02/21/22 US  ABD RUQ IMPRESSION: 1. A positive Murphy's sign was reported. Additionally, a small amount of pericholecystic fluid was identified. These findings could be secondary to the thickening and edema of the gastric antrum identified on recent CT imaging thought to represent gastritis  and/or peptic ulcer disease. No stones or sludge during identified in the gallbladder. No significant wall thickening. If the clinical picture for acute cholecystitis remains  ambiguous, recommend a HIDA scan. 2. Hepatic steatosis.  02/22/22 NM Hepato W/EF IMPRESSION: Reduced gallbladder ejection fraction with abdominal pain post ingestion of Ensure, compatible with chronic cholecystitis/biliary dyskinesia.  02/23/22 Lap chole with LOA  01/31/2020 colonoscopy with Dr. Wilhelmenia, recall 3 years Impression:  - Perianal skin tags found on perianal exam.  - Hemorrhoids found on digital rectal exam.  - The examined portion of the ileum was normal.  - Three 3 to 12 mm polyps in the sigmoid colon, removed with a cold snare. Resected and retrieved.  - One 20 mm polyp in the sigmoid colon, removed with mucosal resection. Resected and retrieved. Clips ( MR conditional) were placed.  - Diverticulosis in the recto- sigmoid colon, in the sigmoid colon and in the descending colon.  - Normal mucosa in the entire examined colon otherwise.  - Non- bleeding non- thrombosed external and internal hemorrhoids. Path: 1. Surgical [P], colon, sigmoid, polyp (3) - TUBULAR ADENOMA, NEGATIVE FOR HIGH GRADE DYSPLASIA (X1). HYPERPLASTIC POLYP (X3). 2. Surgical [P], colon, sigmoid, EMR polyp at 21cm - HYPERPLASTIC POLYP.  Past Medical History:  Diagnosis Date   Acute gout 07/30/2014   Allergy    Anemia    Anxiety    Blood transfusion without reported diagnosis    Cigarette nicotine  dependence    Depression, recurrent 09/20/2023   DM (diabetes mellitus) (HCC) 09/10/2022   GERD (gastroesophageal reflux disease)    Gout 2011   Helicobacter pylori gastritis 02/19/2022   Hyperlipidemia associated with type 2 diabetes mellitus (HCC) 03/31/2023   Hypertension    PAF (paroxysmal atrial fibrillation) (HCC)    PVC (premature ventricular contraction)    Sleep apnea    not on cpap at this time 01-17-20    Past Surgical History:  Procedure Laterality Date   ATRIAL FIBRILLATION ABLATION N/A 06/29/2023   Procedure: ATRIAL FIBRILLATION ABLATION;  Surgeon: Kennyth Chew, MD;  Location: [facility]  INVASIVE CV LAB;  Service: Cardiovascular;  Laterality: N/A;   BIOPSY  02/21/2022   Procedure: BIOPSY;  Surgeon: Rollin Dover, MD;  Location: [facility] ENDOSCOPY;  Service: Gastroenterology;;   CHOLECYSTECTOMY N/A 02/23/2022   Procedure: LAPAROSCOPIC CHOLECYSTECTOMY with Lysis of Adhessions;  Surgeon: Lyndel Deward PARAS, MD;  Location: [facility] ORS;  Service: General;  Laterality: N/A;   ESOPHAGOGASTRODUODENOSCOPY (EGD) WITH PROPOFOL  N/A 02/21/2022   Procedure: ESOPHAGOGASTRODUODENOSCOPY (EGD) WITH PROPOFOL ;  Surgeon: Rollin Dover, MD;  Location: [facility] ENDOSCOPY;  Service: Gastroenterology;  Laterality: N/A;   IR 3D INDEPENDENT WKST  02/23/2023   IR 3D INDEPENDENT WKST  02/23/2023   IR ANGIO INTRA EXTRACRAN SEL COM CAROTID INNOMINATE UNI L MOD SED  01/02/2024   IR ANGIO INTRA EXTRACRAN SEL INTERNAL CAROTID BILAT MOD SED  02/23/2023   IR ANGIO VERTEBRAL SEL VERTEBRAL BILAT MOD SED  02/23/2023   IR ANGIOGRAM FOLLOW UP STUDY  02/23/2023   IR CT HEAD LTD  02/23/2023   IR TRANSCATH/EMBOLIZ  02/23/2023   IR US  GUIDE VASC ACCESS RIGHT  02/23/2023   IR US  GUIDE VASC ACCESS RIGHT  01/02/2024   NECK SURGERY     RADIOLOGY WITH ANESTHESIA N/A 02/23/2023   Procedure: Aneurysm embolization;  Surgeon: de Macedo Rodrigues, Katyucia, MD;  Location: [facility] OR;  Service: Radiology;  Laterality: N/A;   TUBAL LIGATION      Current Outpatient Medications  Medication Sig Dispense Refill   apixaban  (  ELIQUIS ) 5 MG TABS tablet Take 1 tablet (5 mg total) by mouth 2 (two) times daily. 180 tablet 3   aspirin  EC 81 MG tablet Take 81 mg by mouth daily. Swallow whole.     atorvastatin  (LIPITOR) 20 MG tablet Take 1 tablet (20 mg total) by mouth every evening. 90 tablet 1   Cholecalciferol (VITAMIN D -3) 25 MCG (1000 UT) CAPS Take 1,000 Units by mouth daily.     estradiol (ESTRACE) 0.1 MG/GM vaginal cream Place 1 Applicatorful vaginally at bedtime.     famotidine  (PEPCID ) 20 MG tablet Take 1 tablet (20 mg total) by mouth daily. 90 tablet 1    losartan  (COZAAR ) 50 MG tablet Take 1 tablet (50 mg total) by mouth in the morning. 90 tablet 3   metFORMIN  (GLUCOPHAGE -XR) 500 MG 24 hr tablet Take 1 tablet (500 mg total) by mouth daily with breakfast. 90 tablet 1   methylcellulose (CITRUCEL) oral powder Take 1 packet by mouth daily. 5 ml     metoprolol  succinate (TOPROL -XL) 50 MG 24 hr tablet Take 1 tablet (50 mg total) by mouth daily. 90 tablet 2   prenatal vitamin w/FE, FA (PRENATAL 1 + 1) 27-1 MG TABS tablet Take 1 tablet by mouth daily at 12 noon. iron      Semaglutide (RYBELSUS) 3 MG TABS Take 1 tablet (3 mg total) by mouth daily. 30 tablet 5   spironolactone  (ALDACTONE ) 25 MG tablet Take 1 tablet (25 mg total) by mouth daily. 90 tablet 3   [START ON 03/02/2024] venlafaxine  XR (EFFEXOR -XR) 75 MG 24 hr capsule Take 1 capsule (75 mg total) by mouth daily with breakfast. 90 capsule 1   No current facility-administered medications for this visit.    Allergies as of 01/05/2024 - Review Complete 01/04/2024  Allergen Reaction Noted   Lisinopril Hives, Swelling, and Other (See Comments) 06/24/2014   Bupropion  Itching and Other (See Comments) 04/20/2015   Other  02/16/2023    Family History  Problem Relation Age of Onset   Diabetes Mother    Hypertension Mother    Cancer Father 48       oral   Diabetes Sister    Hypertension Sister    Diabetes Brother    Hypertension Brother    Breast cancer Paternal Grandmother    Sudden Cardiac Death Neg Hx    Heart attack Neg Hx    Colon cancer Neg Hx    Esophageal cancer Neg Hx    Rectal cancer Neg Hx    Stomach cancer Neg Hx     Social History   Tobacco Use   Smoking status: Former    Current packs/day: 0.00    Average packs/day: 0.5 packs/day for 31.0 years (15.5 ttl pk-yrs)    Types: Cigarettes    Start date: 11/03/1990    Quit date: 11/02/2021    Years since quitting: 2.1   Smokeless tobacco: Never   Tobacco comments:    Former smoker 07/27/23  Vaping Use   Vaping status: Some  Days  Substance Use Topics   Alcohol use: No   Drug use: No     Review of Systems:    Constitutional: No weight loss, fever, chills, weakness or fatigue Eyes: No change in vision Ears, Nose, Throat:  No change in hearing or congestion Skin: No rash or itching Cardiovascular: No chest pain, chest pressure or palpitations   Respiratory: No SOB or cough Gastrointestinal: See HPI and otherwise negative Genitourinary: No dysuria or change in urinary frequency Neurological:  No headache, dizziness or syncope Musculoskeletal: No new muscle or joint pain Hematologic: No bleeding or bruising    Physical Exam:  Vital signs: There were no vitals taken for this visit.  Constitutional: NAD, Well developed, Well nourished, alert and cooperative Head:  Normocephalic and atraumatic.  Eyes: No scleral icterus. Conjunctiva pink. Mouth: No oral lesions. Respiratory: Respirations even and unlabored. Lungs clear to auscultation bilaterally.  No wheezes, crackles, or rhonchi.  Cardiovascular:  Regular rate and rhythm. No murmurs. No peripheral edema. Gastrointestinal:  Soft, nondistended, nontender. No rebound or guarding. Normal bowel sounds. No appreciable masses or hepatomegaly. Rectal:  Not performed.  Neurologic:  Alert and oriented x4;  grossly normal neurologically.  Skin:   Dry and intact without significant lesions or rashes. Psychiatric: Oriented to person, place and time. Demonstrates good judgement and reason without abnormal affect or behaviors.   RELEVANT LABS AND IMAGING: CBC    Component Value Date/Time   WBC 5.6 08/09/2023 1020   RBC 4.38 08/09/2023 1020   HGB 12.4 08/09/2023 1020   HGB 9.9 (L) 11/13/2018 1104   HCT 37.9 08/09/2023 1020   HCT 33.4 (L) 11/13/2018 1104   PLT 422.0 (H) 08/09/2023 1020   PLT 548 (H) 11/13/2018 1104   MCV 86.6 08/09/2023 1020   MCV 75 (L) 11/13/2018 1104   MCH 28.8 02/24/2023 0455   MCHC 32.7 08/09/2023 1020   RDW 14.2 08/09/2023 1020   RDW  16.6 (H) 11/13/2018 1104   LYMPHSABS 2.8 08/09/2023 1020   MONOABS 0.3 08/09/2023 1020   EOSABS 0.2 08/09/2023 1020   BASOSABS 0.0 08/09/2023 1020    CMP     Component Value Date/Time   NA 140 11/10/2023 0902   NA 138 02/03/2021 0945   K 4.1 11/10/2023 0902   CL 101 11/10/2023 0902   CO2 30 11/10/2023 0902   GLUCOSE 123 (H) 11/10/2023 0902   BUN 12 11/10/2023 0902   BUN 10 02/03/2021 0945   CREATININE 0.67 11/10/2023 0902   CALCIUM  9.4 11/10/2023 0902   PROT 7.7 11/10/2023 0902   ALBUMIN 4.5 11/10/2023 0902   AST 20 11/10/2023 0902   ALT 20 11/10/2023 0902   ALKPHOS 105 11/10/2023 0902   BILITOT 0.4 11/10/2023 0902   GFRNONAA >60 02/24/2023 0455   GFRAA 114 11/13/2018 1104     Assessment/Plan:    Colonoscopy with Dr. Wilhelmenia   Camie Furbish, PA-C [facility] 01/04/2024, 8:10 PM  Patient Care Team: Katheen Roselie Rockford, NP as PCP - General (Internal Medicine) Shlomo Wilbert SAUNDERS, MD as PCP - Sleep Medicine (Cardiology) Kennyth Chew, MD as PCP - Electrophysiology (Cardiology) Austin Ned, MD as Consulting Physician (Obstetrics and Gynecology)

## 2024-01-05 ENCOUNTER — Ambulatory Visit: Admitting: Gastroenterology

## 2024-01-10 ENCOUNTER — Encounter: Payer: Self-pay | Admitting: Nurse Practitioner

## 2024-01-11 ENCOUNTER — Telehealth: Payer: Self-pay

## 2024-01-11 ENCOUNTER — Other Ambulatory Visit (HOSPITAL_COMMUNITY): Payer: Self-pay

## 2024-01-11 NOTE — Telephone Encounter (Signed)
 Pharmacy Patient Advocate Encounter  Received notification from [employer] that Prior Authorization for Rybelsus 3MG  tablets  has been APPROVED  to 10.21.26. Ran test claim, Copay is $14.99. This test claim was processed through [facility]- copay amounts may vary at other pharmacies due to pharmacy/plan contracts, or as the patient moves through the different stages of their insurance plan.   PA #/Case ID/Reference #: [id number]

## 2024-01-11 NOTE — Telephone Encounter (Signed)
 Called and left a detailed voice message for patient per DPR on file stating that the PA was approved for Rybelesus 3 mg and that her pharmacy will be in contact when medication is available for pick or that she can give the pharmacy a call. Asked to give the office a call if she has any questions at [phone]

## 2024-01-11 NOTE — Telephone Encounter (Signed)
 Patient sent a MyChart message stating a PA is needed for Rybelsus 3 mg per her pharmacy

## 2024-01-11 NOTE — Telephone Encounter (Signed)
 Called patient and informed her that I will send to our PA team to start the process and I will be in contact with her if any other information is needed and/or the with the process of the PA. She thanked me for calling and all (if any) questions were answered.

## 2024-01-17 ENCOUNTER — Telehealth: Payer: Self-pay | Admitting: Neuroradiology

## 2024-01-17 NOTE — Telephone Encounter (Signed)
 Left VM about rescheduling November 13th appt per Heck's request.

## 2024-01-19 DIAGNOSIS — N912 Amenorrhea, unspecified: Secondary | ICD-10-CM | POA: Diagnosis not present

## 2024-01-26 ENCOUNTER — Telehealth: Admitting: Cardiology

## 2024-01-27 NOTE — Progress Notes (Signed)
 Emma Stephens                                          MRN: [id number]   01/27/2024   The [facility] Quality Team Specialist reviewed this patient medical record for the purposes of chart review for care gap closure. The following were reviewed: abstraction for care gap closure-kidney health evaluation for diabetes:eGFR  and uACR.    [facility] Quality Team

## 2024-01-30 ENCOUNTER — Telehealth: Payer: Self-pay | Admitting: Neuroradiology

## 2024-01-30 NOTE — Telephone Encounter (Signed)
Left message for patient to call the office to reschedule appt

## 2024-02-01 NOTE — Telephone Encounter (Signed)
Left message for patient to call the office to reschedule appt

## 2024-02-02 ENCOUNTER — Encounter: Admitting: Neuroradiology

## 2024-02-03 ENCOUNTER — Other Ambulatory Visit: Payer: Self-pay | Admitting: Nurse Practitioner

## 2024-02-03 DIAGNOSIS — I700 Atherosclerosis of aorta: Secondary | ICD-10-CM

## 2024-02-03 DIAGNOSIS — E1169 Type 2 diabetes mellitus with other specified complication: Secondary | ICD-10-CM

## 2024-02-03 NOTE — Telephone Encounter (Signed)
Left message for patient to call the office to reschedule appt

## 2024-02-05 ENCOUNTER — Other Ambulatory Visit: Payer: Self-pay | Admitting: Nurse Practitioner

## 2024-02-06 ENCOUNTER — Ambulatory Visit (HOSPITAL_BASED_OUTPATIENT_CLINIC_OR_DEPARTMENT_OTHER): Attending: Cardiology | Admitting: Cardiology

## 2024-02-06 DIAGNOSIS — I10 Essential (primary) hypertension: Secondary | ICD-10-CM | POA: Insufficient documentation

## 2024-02-06 DIAGNOSIS — G4733 Obstructive sleep apnea (adult) (pediatric): Secondary | ICD-10-CM | POA: Insufficient documentation

## 2024-02-06 DIAGNOSIS — I480 Paroxysmal atrial fibrillation: Secondary | ICD-10-CM | POA: Diagnosis not present

## 2024-02-06 DIAGNOSIS — G4736 Sleep related hypoventilation in conditions classified elsewhere: Secondary | ICD-10-CM | POA: Diagnosis not present

## 2024-02-07 ENCOUNTER — Ambulatory Visit: Admitting: Dietician

## 2024-02-08 ENCOUNTER — Ambulatory Visit: Admitting: Nurse Practitioner

## 2024-02-08 ENCOUNTER — Encounter: Admitting: Neuroradiology

## 2024-02-20 ENCOUNTER — Encounter: Payer: Self-pay | Admitting: Cardiology

## 2024-02-22 ENCOUNTER — Encounter: Admitting: Neuroradiology

## 2024-03-01 ENCOUNTER — Ambulatory Visit: Payer: Self-pay | Admitting: Nurse Practitioner

## 2024-03-01 ENCOUNTER — Encounter: Payer: Self-pay | Admitting: Gastroenterology

## 2024-03-01 ENCOUNTER — Ambulatory Visit: Admitting: Nurse Practitioner

## 2024-03-01 ENCOUNTER — Ambulatory Visit (INDEPENDENT_AMBULATORY_CARE_PROVIDER_SITE_OTHER): Admitting: Gastroenterology

## 2024-03-01 ENCOUNTER — Encounter: Payer: Self-pay | Admitting: Nurse Practitioner

## 2024-03-01 ENCOUNTER — Telehealth: Payer: Self-pay

## 2024-03-01 VITALS — BP 128/74 | HR 88 | Temp 98.1°F | Ht 61.0 in | Wt 180.2 lb

## 2024-03-01 VITALS — BP 128/74 | HR 91 | Ht 61.0 in | Wt 180.0 lb

## 2024-03-01 DIAGNOSIS — Z860101 Personal history of adenomatous and serrated colon polyps: Secondary | ICD-10-CM | POA: Diagnosis not present

## 2024-03-01 DIAGNOSIS — E1169 Type 2 diabetes mellitus with other specified complication: Secondary | ICD-10-CM | POA: Diagnosis not present

## 2024-03-01 DIAGNOSIS — I10 Essential (primary) hypertension: Secondary | ICD-10-CM | POA: Diagnosis not present

## 2024-03-01 DIAGNOSIS — D509 Iron deficiency anemia, unspecified: Secondary | ICD-10-CM | POA: Diagnosis not present

## 2024-03-01 DIAGNOSIS — G4733 Obstructive sleep apnea (adult) (pediatric): Secondary | ICD-10-CM

## 2024-03-01 DIAGNOSIS — Z09 Encounter for follow-up examination after completed treatment for conditions other than malignant neoplasm: Secondary | ICD-10-CM | POA: Diagnosis not present

## 2024-03-01 DIAGNOSIS — Z7901 Long term (current) use of anticoagulants: Secondary | ICD-10-CM

## 2024-03-01 DIAGNOSIS — K219 Gastro-esophageal reflux disease without esophagitis: Secondary | ICD-10-CM

## 2024-03-01 DIAGNOSIS — H9313 Tinnitus, bilateral: Secondary | ICD-10-CM | POA: Insufficient documentation

## 2024-03-01 LAB — RENAL FUNCTION PANEL
Albumin: 4.6 g/dL (ref 3.5–5.2)
BUN: 8 mg/dL (ref 6–23)
CO2: 31 meq/L (ref 19–32)
Calcium: 9.7 mg/dL (ref 8.4–10.5)
Chloride: 101 meq/L (ref 96–112)
Creatinine, Ser: 0.71 mg/dL (ref 0.40–1.20)
GFR: 96.06 mL/min (ref >60.00)
Glucose, Bld: 115 mg/dL — ABNORMAL HIGH (ref 70–99)
Phosphorus: 4.6 mg/dL (ref 2.3–4.6)
Potassium: 3.9 meq/L (ref 3.5–5.1)
Sodium: 140 meq/L (ref 135–145)

## 2024-03-01 LAB — CBC
HCT: 38.6 % (ref 36.0–46.0)
Hemoglobin: 12.8 g/dL (ref 12.0–15.0)
MCHC: 33.2 g/dL (ref 30.0–36.0)
MCV: 87 fl (ref 78.0–100.0)
Platelets: 363 K/uL (ref 150.0–400.0)
RBC: 4.43 Mil/uL (ref 3.87–5.11)
RDW: 13.6 % (ref 11.5–15.5)
WBC: 5 K/uL (ref 4.0–10.5)

## 2024-03-01 LAB — IBC + FERRITIN
Ferritin: 27.9 ng/mL (ref 10.0–291.0)
Iron: 72 ug/dL (ref 42–145)
Saturation Ratios: 17.7 % — ABNORMAL LOW (ref 20.0–50.0)
TIBC: 407.4 ug/dL (ref 250.0–450.0)
Transferrin: 291 mg/dL (ref 212.0–360.0)

## 2024-03-01 LAB — TSH: TSH: 0.67 u[IU]/mL (ref 0.35–5.50)

## 2024-03-01 LAB — HEMOGLOBIN A1C: Hgb A1c MFr Bld: 7 % — ABNORMAL HIGH (ref 4.6–6.5)

## 2024-03-01 MED ORDER — RYBELSUS 7 MG PO TABS: 7.0000 mg | ORAL_TABLET | Freq: Every day | ORAL | 5 refills | Status: AC

## 2024-03-01 MED ORDER — NA SULFATE-K SULFATE-MG SULF 17.5-3.13-1.6 GM/177ML PO SOLN: 1.0000 | Freq: Once | ORAL | 0 refills | Status: AC

## 2024-03-01 NOTE — Assessment & Plan Note (Signed)
 BP at goal losartan  and spironolactone  BP Readings from Last 3 Encounters:  03/01/24 128/74  03/01/24 128/74  01/04/24 128/68    Maintain med doses Repeat BMP: stable F/up in 3-53months

## 2024-03-01 NOTE — Patient Instructions (Signed)
 You have been scheduled for a colonoscopy. Please follow written instructions given to you at your visit today.   If you use inhalers (even only as needed), please bring them with you on the day of your procedure.  DO NOT TAKE 7 DAYS PRIOR TO TEST- Trulicity (dulaglutide) Ozempic, Wegovy (semaglutide ) Mounjaro, Zepbound (tirzepatide) Bydureon Bcise (exanatide extended release)  DO NOT TAKE 1 DAY PRIOR TO YOUR TEST Rybelsus  (semaglutide ) Adlyxin (lixisenatide) Victoza (liraglutide) Byetta (exanatide) ___________________________________________________________________________  _______________________________________________________  If your blood pressure at your visit was 140/90 or greater, please contact your primary care physician to follow up on this.  _______________________________________________________  If you are age 63 or older, your body mass index should be between 23-30. Your Body mass index is 34.01 kg/m. If this is out of the aforementioned range listed, please consider follow up with your Primary Care Provider.  If you are age 27 or younger, your body mass index should be between 19-25. Your Body mass index is 34.01 kg/m. If this is out of the aformentioned range listed, please consider follow up with your Primary Care Provider.   ________________________________________________________  The [facility] providers would like to encourage you to use MYCHART to communicate with providers for non-urgent requests or questions.  Due to long hold times on the telephone, sending your provider a message by [facility] may be a faster and more efficient way to get a response.  Please allow 48 business hours for a response.  Please remember that this is for non-urgent requests.  _______________________________________________________  [facility] is using a team-based approach to care.  Your team is made up of your doctor and two to three APPS. Our APPS (Nurse  Practitioners and Physician Assistants) work with your physician to ensure care continuity for you. They are fully qualified to address your health concerns and develop a treatment plan. They communicate directly with your gastroenterologist to care for you. Seeing the Advanced Practice Practitioners on your physician's team can help you by facilitating care more promptly, often allowing for earlier appointments, access to diagnostic testing, procedures, and other specialty referrals.

## 2024-03-01 NOTE — Telephone Encounter (Signed)
 [facility] Pre-operative Risk Assessment     Request for surgical clearance:     Endoscopy Procedure  What type of surgery is being performed?     Colonoscopy  When is this surgery scheduled?     05/01/2024         What type of clearance is required ?   Pharmacy  Are there any medications that need to be held prior to surgery and how long? Eliquis  - 2 days  Practice name and name of physician performing surgery?      [facility]  What is your office phone and fax number?      Phone- [phone]  Fax- [phone]  Anesthesia type (None, local, MAC, general) ?       MAC   Please route your response to [facility]

## 2024-03-01 NOTE — Assessment & Plan Note (Signed)
 No adverse effects with rybelsus  3mg  Requested eye exam report from ophthalmology BP at goal LDL at goal. No neuropathy. Normal UACr  Repeat hgbA1c: Improving 7.6% to 7.0%.  Stable BMP increase rybelsus  dose to 7mg  daily. New Prescription sent. F/up in 3-79months

## 2024-03-01 NOTE — Patient Instructions (Signed)
 Go to lab Maintain Heart healthy diet and daily exercise. Maintain current medications. Will entered ENT referral to evaluate for meniere's disease if labs are normal

## 2024-03-01 NOTE — Progress Notes (Signed)
 Emma Stephens [id number] [birth year]   Chief Complaint: Colon cancer screening  Referring Provider: Katheen Roselie Rockford, NP Primary GI MD: Dr. Wilhelmenia   HPI: Emma Stephens is a 53 y.o. female with past medical history of anxiety/depression, anemia, T2DM, GERD, H. pylori gastritis 2023, HLD, HTN, PAF s/p ablation 06/29/2023, intracranial aneurysm s/p stent 02/2023, sleep apnea, cholecystectomy who presents today to discuss colon cancer screening.    On 02/20/2022 Dr. Curtis on-call was consulted to see patient for epigastric pain and abnormal CT scan. A CT scan of the abdomen was performed and a thickening was noted in the antrum. There was associated stranding and some free fluid in the abdomen. She has a history of GERD and one month ago it was very severe. Prior to that time she was taking pantoprazole  QAM for mild symptoms. Another medication was prescribed, in addition to the pantoprazole , but her GERD remitted spontaneously.    Last seen in office 05/31/2023 by Cathryne May, NP for complaint of lower abdominal pain and changes in bowel habits.  Had been previously treated for H. pylori and eradication test done and negative 04/2022.  Endorsed some dark stools on iron .  Discussed adding bile acid sequestrant for possible bile salt diarrhea but patient declined due to concerns it would cause constipation.  Advised to try OTC fiber, and consideration for pelvic floor physical therapy.   Was to schedule colonoscopy pending workup with cardiology.  Was scheduled for cardioversion the following month.   Labs at that time were normal with no evidence of anemia.   Cardiology appointment 11/11/2023.  Noted to have been seen in the AF clinic on 07/27/2023 and flecainide  was stopped.  Patient had recurrent A-fib and she restarted.  Went off flecainide  after her ablation on 06/29/2023 and had initially short bursts of A-fib, then had an episode lasting 2-1/2 days.  On flecainide  for almost a month at  time of last visit and had no AF episodes.  Compliant with Eliquis .  Patient with cardiology 11/15/2023 for evaluation of OSA.  Sleep evaluation ordered to see if she would be a candidate for possible inspire device.  Seen by neurosurgery 12/22/2023.  History of brain aneurysm treated with pipeline on 02/23/2023.  Subsequently found to have A-fib.  Was advised to have follow-up arteriogram.  Cerebral arteriogram 01/02/2024 showed complete occlusion of left internal carotid artery aneurysm.  Follow-up appointment with neurosurgery as scheduled for 03/07/2024.  Had appointment with PCP this morning.    Discussed the use of AI scribe software for clinical note transcription with the patient, who gave verbal consent to proceed.  History of Present Illness Emma Stephens is a 53 year old female who presents for scheduling a repeat colonoscopy.  Colorectal neoplasia surveillance - Repeat colonoscopy due following discovery of precancerous polyps during colonoscopy in 2021. - No hematochezia. - No unintentional weight loss. - Regular daily bowel movements. - Occasional constipation and diarrhea.  Cardiac arrhythmia - History of atrial fibrillation, status post successful ablation in April. - No recurrence of atrial fibrillation since ablation. - No history of myocardial infarction or cerebrovascular accident. - Occasional heart flutters. - No longer taking flecainide . - Continues Eliquis  for anticoagulation.  Cerebral aneurysm - History of surgically treated aneurysm. - Follow-up care transitioned to new neurosurgeon after previous provider relocation. - Recent follow-up procedure confirmed resolution of aneurysm. - Routine follow-ups scheduled.  Obstructive sleep apnea - Severe sleep apnea. - Unable to tolerate CPAP due to discomfort and disruption of relationship with husband. -  Awaiting further instructions regarding placement of Inspire device.  Gastroesophageal reflux  disease - Acid reflux managed with daily famotidine . - Previously treated with pantoprazole , switched to famotidine  for faster relief. - Famotidine  taken daily as preventative measure; occasional extra dose with certain foods. - Reflux currently well-controlled.   Previous GI Procedures/Imaging   01/25/23 CT A/P W Contrast IMPRESSION: 1. No acute intra-abdominal process. 2. 4.5 cm simple appearing cyst in the left ovary. Follow-up pelvic ultrasound in 6-12 months is recommended. 3.  Aortic Atherosclerosis (ICD10-I70.0).   05/14/22 H pylori antigen stool negative    02/19/22 CT A/P W Contrast IMPRESSION: 1. Thickened, edematous appearance of the gastric antrum with mild adjacent fat stranding and trace free fluid. Findings are suggestive of gastritis and/or peptic ulcer disease. Somewhat asymmetric appearance of the mucosal edema along the lesser curvature may be reactive. Underlying neoplasm would be difficult to exclude. Close clinical follow-up as well as a follow-up endoscopy are recommended. 2. Colonic diverticulosis without evidence of acute diverticulitis. 3. Benign 1.9 cm hepatic hemangioma. 4. Aortic atherosclerosis (ICD10-I70.0).   02/21/2022 upper endoscopy with Dr. Rollin Impression:  - Normal esophagus.  - Gastritis. Biopsied.  - Normal examined duodenum. Path: A.   GASTRIC BIOPSY:  -    Active chronic gastritis with associated Helicobacter pylori  infection (H. pylori immunohistochemical (IHC) stain examined, with  satisfactory controls).  -    Negative for intestinal metaplasia and malignancy.    02/21/22 US  ABD RUQ IMPRESSION: 1. A positive Murphy's sign was reported. Additionally, a small amount of pericholecystic fluid was identified. These findings could be secondary to the thickening and edema of the gastric antrum identified on recent CT imaging thought to represent gastritis and/or peptic ulcer disease. No stones or sludge during identified in the  gallbladder. No significant wall thickening. If the clinical picture for acute cholecystitis remains ambiguous, recommend a HIDA scan. 2. Hepatic steatosis.   02/22/22 NM Hepato W/EF IMPRESSION: Reduced gallbladder ejection fraction with abdominal pain post ingestion of Ensure, compatible with chronic cholecystitis/biliary dyskinesia.   02/23/22 Lap chole with LOA   01/31/2020 colonoscopy with Dr. Wilhelmenia, recall 3 years Impression:  - Perianal skin tags found on perianal exam.  - Hemorrhoids found on digital rectal exam.  - The examined portion of the ileum was normal.  - Three 3 to 12 mm polyps in the sigmoid colon, removed with a cold snare. Resected and retrieved.  - One 20 mm polyp in the sigmoid colon, removed with mucosal resection. Resected and retrieved. Clips ( MR conditional) were placed.  - Diverticulosis in the recto- sigmoid colon, in the sigmoid colon and in the descending colon.  - Normal mucosa in the entire examined colon otherwise.  - Non- bleeding non- thrombosed external and internal hemorrhoids. Path: 1. Surgical [P], colon, sigmoid, polyp (3) - TUBULAR ADENOMA, NEGATIVE FOR HIGH GRADE DYSPLASIA (X1). HYPERPLASTIC POLYP (X3). 2. Surgical [P], colon, sigmoid, EMR polyp at 21cm - HYPERPLASTIC POLYP.   Past Medical History:  Diagnosis Date   Acute gout 07/30/2014   Allergy    Anemia    Anxiety    Blood transfusion without reported diagnosis    Cigarette nicotine  dependence    Depression, recurrent 09/20/2023   DM (diabetes mellitus) (HCC) 09/10/2022   GERD (gastroesophageal reflux disease)    Gout 2011   Helicobacter pylori gastritis 02/19/2022   Hyperlipidemia associated with type 2 diabetes mellitus (HCC) 03/31/2023   Hypertension    PAF (paroxysmal atrial fibrillation) (HCC)    PVC (  premature ventricular contraction)    Sleep apnea    not on cpap at this time 01-17-20    Past Surgical History:  Procedure Laterality Date   ATRIAL FIBRILLATION  ABLATION N/A 06/29/2023   Procedure: ATRIAL FIBRILLATION ABLATION;  Surgeon: Kennyth Chew, MD;  Location[facility] INVASIVE CV LAB;  Service: Cardiovascular;  Laterality: N/A;   BIOPSY  02/21/2022   Procedure: BIOPSY;  Surgeon: Rollin Dover, MD;  Location: [facility] ENDOSCOPY;  Service: Gastroenterology;;   CHOLECYSTECTOMY N/A 02/23/2022   Procedure: LAPAROSCOPIC CHOLECYSTECTOMY with Lysis of Adhessions;  Surgeon: Lyndel Deward PARAS, MD;  Location: [facility] ORS;  Service: General;  Laterality: N/A;   ESOPHAGOGASTRODUODENOSCOPY (EGD) WITH PROPOFOL  N/A 02/21/2022   Procedure: ESOPHAGOGASTRODUODENOSCOPY (EGD) WITH PROPOFOL ;  Surgeon: Rollin Dover, MD;  Location: [facility] ENDOSCOPY;  Service: Gastroenterology;  Laterality: N/A;   IR 3D INDEPENDENT WKST  02/23/2023   IR 3D INDEPENDENT WKST  02/23/2023   IR ANGIO INTRA EXTRACRAN SEL COM CAROTID INNOMINATE UNI L MOD SED  01/02/2024   IR ANGIO INTRA EXTRACRAN SEL INTERNAL CAROTID BILAT MOD SED  02/23/2023   IR ANGIO VERTEBRAL SEL VERTEBRAL BILAT MOD SED  02/23/2023   IR ANGIOGRAM FOLLOW UP STUDY  02/23/2023   IR CT HEAD LTD  02/23/2023   IR TRANSCATH/EMBOLIZ  02/23/2023   IR US  GUIDE VASC ACCESS RIGHT  02/23/2023   IR US  GUIDE VASC ACCESS RIGHT  01/02/2024   NECK SURGERY     RADIOLOGY WITH ANESTHESIA N/A 02/23/2023   Procedure: Aneurysm embolization;  Surgeon: de Macedo Rodrigues, Katyucia, MD;  Location: [facility] OR;  Service: Radiology;  Laterality: N/A;   TUBAL LIGATION      Current Outpatient Medications  Medication Sig Dispense Refill   apixaban  (ELIQUIS ) 5 MG TABS tablet Take 1 tablet (5 mg total) by mouth 2 (two) times daily. 180 tablet 3   aspirin  EC 81 MG tablet Take 81 mg by mouth daily. Swallow whole.     atorvastatin  (LIPITOR) 20 MG tablet TAKE 1 TABLET BY MOUTH EVERY DAY IN THE EVENING 30 tablet 5   Cholecalciferol (VITAMIN D -3) 25 MCG (1000 UT) CAPS Take 1,000 Units by mouth daily.     famotidine  (PEPCID ) 20 MG tablet TAKE 1 TABLET BY MOUTH EVERY DAY 30 tablet 5    flecainide  (TAMBOCOR ) 100 MG tablet Take 100 mg by mouth 2 (two) times daily.     losartan  (COZAAR ) 50 MG tablet Take 1 tablet (50 mg total) by mouth in the morning. 90 tablet 3   metoprolol  succinate (TOPROL -XL) 50 MG 24 hr tablet Take 1 tablet (50 mg total) by mouth daily. 90 tablet 2   prenatal vitamin w/FE, FA (PRENATAL 1 + 1) 27-1 MG TABS tablet Take 1 tablet by mouth daily at 12 noon. iron      Semaglutide  (RYBELSUS ) 3 MG TABS Take 1 tablet (3 mg total) by mouth daily. 30 tablet 5   spironolactone  (ALDACTONE ) 25 MG tablet Take 1 tablet (25 mg total) by mouth daily. 90 tablet 3   [START ON 03/02/2024] venlafaxine  XR (EFFEXOR -XR) 75 MG 24 hr capsule Take 1 capsule (75 mg total) by mouth daily with breakfast. 90 capsule 1   No current facility-administered medications for this visit.    Allergies as of 03/01/2024 - Review Complete 03/01/2024  Allergen Reaction Noted   Lisinopril Hives, Swelling, and Other (See Comments) 06/24/2014   Bupropion  Itching and Other (See Comments) 04/20/2015   Other  02/16/2023    Family History  Problem Relation Age of Onset  Diabetes Mother    Hypertension Mother    Cancer Father 54       oral   Diabetes Sister    Hypertension Sister    Diabetes Brother    Hypertension Brother    Breast cancer Paternal Grandmother    Sudden Cardiac Death Neg Hx    Heart attack Neg Hx    Colon cancer Neg Hx    Esophageal cancer Neg Hx    Rectal cancer Neg Hx    Stomach cancer Neg Hx     Social History[1]   Review of Systems:    Constitutional: No weight loss, fever, chills Cardiovascular: No chest pain Respiratory: No SOB  Gastrointestinal: See HPI and otherwise negative   Physical Exam:  Vital signs: BP 128/74   Pulse 91   Ht 5' 1 (1.549 m)   Wt 180 lb (81.6 kg)   SpO2 96%   BMI 34.01 kg/m   Constitutional: Pleasant, well-appearing female in NAD, alert and cooperative Head:  Normocephalic and atraumatic.  Eyes: No scleral icterus.   Respiratory: Respirations even and unlabored. Lungs clear to auscultation bilaterally.  No wheezes, crackles, or rhonchi.  Cardiovascular:  Regular rate and rhythm. No murmurs. No peripheral edema. Gastrointestinal:  Soft, nondistended, nontender. No rebound or guarding. Normal bowel sounds. No appreciable masses or hepatomegaly. Rectal:  Not performed.  Neurologic:  Alert and oriented x4;  grossly normal neurologically.  Skin:   Dry and intact without significant lesions or rashes. Psychiatric: Oriented to person, place and time. Demonstrates good judgement and reason without abnormal affect or behaviors.   RELEVANT LABS AND IMAGING: CBC    Component Value Date/Time   WBC 5.6 08/09/2023 1020   RBC 4.38 08/09/2023 1020   HGB 12.4 08/09/2023 1020   HGB 9.9 (L) 11/13/2018 1104   HCT 37.9 08/09/2023 1020   HCT 33.4 (L) 11/13/2018 1104   PLT 422.0 (H) 08/09/2023 1020   PLT 548 (H) 11/13/2018 1104   MCV 86.6 08/09/2023 1020   MCV 75 (L) 11/13/2018 1104   MCH 28.8 02/24/2023 0455   MCHC 32.7 08/09/2023 1020   RDW 14.2 08/09/2023 1020   RDW 16.6 (H) 11/13/2018 1104   LYMPHSABS 2.8 08/09/2023 1020   MONOABS 0.3 08/09/2023 1020   EOSABS 0.2 08/09/2023 1020   BASOSABS 0.0 08/09/2023 1020    CMP     Component Value Date/Time   NA 140 11/10/2023 0902   NA 138 02/03/2021 0945   K 4.1 11/10/2023 0902   CL 101 11/10/2023 0902   CO2 30 11/10/2023 0902   GLUCOSE 123 (H) 11/10/2023 0902   BUN 12 11/10/2023 0902   BUN 10 02/03/2021 0945   CREATININE 0.67 11/10/2023 0902   CALCIUM  9.4 11/10/2023 0902   PROT 7.7 11/10/2023 0902   ALBUMIN 4.5 11/10/2023 0902   AST 20 11/10/2023 0902   ALT 20 11/10/2023 0902   ALKPHOS 105 11/10/2023 0902   BILITOT 0.4 11/10/2023 0902   GFRNONAA >60 02/24/2023 0455   GFRAA 114 11/13/2018 1104     Assessment/Plan:   Assessment & Plan  History of adenomatous colon polyps Last colonoscopy 2021 with 3-year recall recommended due to adenomatous  colon polyps.    - Schedule colonoscopy. I thoroughly discussed the procedure with the patient to include nature of the procedure, alternatives, benefits, and risks (including but not limited to bleeding, infection, perforation, anesthesia/cardiac/pulmonary complications). Patient verbalized understanding and gave verbal consent to proceed with procedure.   Atrial fibrillation, status post ablation, on  anticoagulation Status post successful ablation in April. Currently off flecainide  but continues on Eliquis  for stroke prevention. Occasional fluttering noted but no significant recurrence of AFib.   - Request permission to hold Eliquis  for procedure   Camie Furbish, PA-C [facility] 03/01/2024, 10:53 AM  Patient Care Team: Nche, Roselie Rockford, NP as PCP - General (Internal Medicine) Shlomo Wilbert SAUNDERS, MD as PCP - Sleep Medicine (Cardiology) Kennyth Chew, MD as PCP - Electrophysiology (Cardiology) Austin Ned, MD as Consulting Physician (Obstetrics and Gynecology)       [1]  Social History Tobacco Use   Smoking status: Former    Current packs/day: 0.00    Average packs/day: 0.5 packs/day for 31.0 years (15.5 ttl pk-yrs)    Types: Cigarettes    Start date: 11/03/1990    Quit date: 11/02/2021    Years since quitting: 2.3   Smokeless tobacco: Never   Tobacco comments:    Former smoker 07/27/23  Vaping Use   Vaping status: Some Days  Substance Use Topics   Alcohol use: No   Drug use: No

## 2024-03-01 NOTE — Assessment & Plan Note (Addendum)
 Onset 2weeks ago, constant, waxing and waning, Associated with bilateral decreased hearing, worse on left side. Also associated with intermittent dizziness, occurs randomly No ear pain, no head injury, no change in vision, no headache, no sinus congestion. No change in medication or new med in last 4weeks.   Possible meniere's disease? Repeat CBC, iron , bmp, tsh: all normal Entered referral to ENT and audiology

## 2024-03-01 NOTE — Progress Notes (Signed)
 Attending Physician's Attestation   I have reviewed the chart.   I agree with the Advanced Practitioner's note, impression, and recommendations with any updates as below. She is an appropriate candidate for [facility] colonoscopy once we have approval for anticoagulation hold.   Aloha Finner, MD [facility] Advanced Endoscopy Office # [phone]

## 2024-03-01 NOTE — Progress Notes (Signed)
 Established Patient Visit  Patient: Emma Stephens   DOB: 05-26-1970   53 y.o. Female  MRN: [id number] Visit Date: 03/01/2024  Subjective:    Chief Complaint  Patient presents with   Follow-up    NOT FASTING 3 month follow up for DM, HTN, Depression and anxieety Ringing in ears for 1-2 weeks with some lightheadedness and woozy feeling  Requesting Eye Exam    HPI DM (diabetes mellitus) (HCC) No adverse effects with rybelsus  3mg  Requested eye exam report from ophthalmology BP at goal LDL at goal. No neuropathy. Normal UACr  Repeat hgbA1c: Improving 7.6% to 7.0%.  Stable BMP increase rybelsus  dose to 7mg  daily. New Prescription sent. F/up in 3-38months  Essential hypertension, benign BP at goal losartan  and spironolactone  BP Readings from Last 3 Encounters:  03/01/24 128/74  03/01/24 128/74  01/04/24 128/68    Maintain med doses Repeat BMP: stable F/up in 3-10months  Tinnitus, bilateral Onset 2weeks ago, constant, waxing and waning, Associated with bilateral decreased hearing, worse on left side. Also associated with intermittent dizziness, occurs randomly No ear pain, no head injury, no change in vision, no headache, no sinus congestion. No change in medication or new med in last 4weeks.   Possible meniere's disease? Repeat CBC, iron , bmp, tsh: all normal Entered referral to ENT and audiology  Reviewed medical, surgical, and social history today  Medications: Show/hide medication list[1] Reviewed past medical and social history.   ROS per HPI above      Objective:  BP 128/74 (BP Location: Left Arm, Patient Position: Sitting, Cuff Size: Large)   Pulse 88   Temp 98.1 F (36.7 C) (Oral)   Ht 5' 1 (1.549 m)   Wt 180 lb 3.2 oz (81.7 kg)   SpO2 96%   BMI 34.05 kg/m      Physical Exam HENT:     Right Ear: Tympanic membrane, ear canal and external ear normal. No decreased hearing noted. There is no impacted cerumen. No mastoid  tenderness.     Left Ear: Tympanic membrane, ear canal and external ear normal. Decreased hearing noted. There is no impacted cerumen. No mastoid tenderness.     Ears:     Weber exam findings: Lateralizes left.    Right Rinne: AC > BC.    Left Rinne: AC > BC.    Nose: Nose normal.  Cardiovascular:     Rate and Rhythm: Normal rate and regular rhythm.     Pulses: Normal pulses.     Heart sounds: Normal heart sounds.  Pulmonary:     Effort: Pulmonary effort is normal.     Breath sounds: Normal breath sounds.  Musculoskeletal:     Cervical back: Normal range of motion and neck supple.     Right lower leg: No edema.     Left lower leg: No edema.  Lymphadenopathy:     Cervical: No cervical adenopathy.  Neurological:     Mental Status: She is alert and oriented to person, place, and time.  Psychiatric:        Mood and Affect: Mood normal.        Behavior: Behavior normal.        Thought Content: Thought content normal.     Results for orders placed or performed in visit on 03/01/24  Hemoglobin A1c  Result Value Ref Range   Hgb A1c MFr Bld 7.0 (H) 4.6 - 6.5 %  Renal Function  Panel  Result Value Ref Range   Sodium 140 135 - 145 mEq/L   Potassium 3.9 3.5 - 5.1 mEq/L   Chloride 101 96 - 112 mEq/L   CO2 31 19 - 32 mEq/L   Albumin 4.6 3.5 - 5.2 g/dL   BUN 8 6 - 23 mg/dL   Creatinine, Ser 9.28 0.40 - 1.20 mg/dL   Glucose, Bld 884 (H) 70 - 99 mg/dL   Phosphorus 4.6 2.3 - 4.6 mg/dL   GFR 03.93 >39.99 mL/min   Calcium  9.7 8.4 - 10.5 mg/dL  CBC  Result Value Ref Range   WBC 5.0 4.0 - 10.5 K/uL   RBC 4.43 3.87 - 5.11 Mil/uL   Platelets 363.0 150.0 - 400.0 K/uL   Hemoglobin 12.8 12.0 - 15.0 g/dL   HCT 61.3 63.9 - 53.9 %   MCV 87.0 78.0 - 100.0 fl   MCHC 33.2 30.0 - 36.0 g/dL   RDW 86.3 88.4 - 84.4 %  IBC + Ferritin  Result Value Ref Range   Iron  72 42 - 145 ug/dL   Transferrin 708.9 787.9 - 360.0 mg/dL   Saturation Ratios 82.2 (L) 20.0 - 50.0 %   Ferritin 27.9 10.0 - 291.0  ng/mL   TIBC 407.4 250.0 - 450.0 mcg/dL  TSH  Result Value Ref Range   TSH 0.67 0.35 - 5.50 uIU/mL      Assessment & Plan:    Problem List Items Addressed This Visit     DM (diabetes mellitus) (HCC)   No adverse effects with rybelsus  3mg  Requested eye exam report from ophthalmology BP at goal LDL at goal. No neuropathy. Normal UACr  Repeat hgbA1c: Improving 7.6% to 7.0%.  Stable BMP increase rybelsus  dose to 7mg  daily. New Prescription sent. F/up in 3-52months      Relevant Orders   Hemoglobin A1c (Completed)   Renal Function Panel (Completed)   Essential hypertension, benign - Primary   BP at goal losartan  and spironolactone  BP Readings from Last 3 Encounters:  03/01/24 128/74  03/01/24 128/74  01/04/24 128/68    Maintain med doses Repeat BMP: stable F/up in 3-52months      Relevant Orders   Renal Function Panel (Completed)   TSH (Completed)   Iron  deficiency anemia   Relevant Orders   CBC (Completed)   IBC + Ferritin (Completed)   Tinnitus, bilateral   Onset 2weeks ago, constant, waxing and waning, Associated with bilateral decreased hearing, worse on left side. Also associated with intermittent dizziness, occurs randomly No ear pain, no head injury, no change in vision, no headache, no sinus congestion. No change in medication or new med in last 4weeks.   Possible meniere's disease? Repeat CBC, iron , bmp, tsh: all normal Entered referral to ENT and audiology      Relevant Orders   Ambulatory referral to ENT   Ambulatory referral to Audiology   Return in about 3 months (around 05/30/2024) for HTN, DM, hyperlipidemia (fasting).     Roselie Mood, NP      [1]  Outpatient Medications Prior to Visit  Medication Sig   apixaban  (ELIQUIS ) 5 MG TABS tablet Take 1 tablet (5 mg total) by mouth 2 (two) times daily.   aspirin  EC 81 MG tablet Take 81 mg by mouth daily. Swallow whole.   atorvastatin  (LIPITOR) 20 MG tablet TAKE 1 TABLET BY MOUTH EVERY DAY  IN THE EVENING   Cholecalciferol (VITAMIN D -3) 25 MCG (1000 UT) CAPS Take 1,000 Units by mouth daily.   famotidine  (PEPCID )  20 MG tablet TAKE 1 TABLET BY MOUTH EVERY DAY   flecainide  (TAMBOCOR ) 100 MG tablet Take 100 mg by mouth 2 (two) times daily.   losartan  (COZAAR ) 50 MG tablet Take 1 tablet (50 mg total) by mouth in the morning.   metoprolol  succinate (TOPROL -XL) 50 MG 24 hr tablet Take 1 tablet (50 mg total) by mouth daily.   prenatal vitamin w/FE, FA (PRENATAL 1 + 1) 27-1 MG TABS tablet Take 1 tablet by mouth daily at 12 noon. iron    spironolactone  (ALDACTONE ) 25 MG tablet Take 1 tablet (25 mg total) by mouth daily.   [START ON 03/02/2024] venlafaxine  XR (EFFEXOR -XR) 75 MG 24 hr capsule Take 1 capsule (75 mg total) by mouth daily with breakfast.   [DISCONTINUED] Semaglutide  (RYBELSUS ) 3 MG TABS Take 1 tablet (3 mg total) by mouth daily.   No facility-administered medications prior to visit.

## 2024-03-02 ENCOUNTER — Encounter (INDEPENDENT_AMBULATORY_CARE_PROVIDER_SITE_OTHER): Payer: Self-pay

## 2024-03-06 NOTE — Telephone Encounter (Signed)
° °  Patient Name: Emma Stephens  DOB: 1971/01/08 MRN: [id number]  Primary Cardiologist: None  Chart reviewed as part of pre-operative protocol coverage. Pre-op clearance already addressed by colleagues in earlier phone notes. To summarize recommendations:  -Per office protocol, patient can hold Eliquis  for 2 days prior to procedure.  Please resume when medically safe to do so.  No medical clearance was requested.  Will route this bundled recommendation to requesting provider via Epic fax function and remove from pre-op pool. Please call with questions.  Orren LOISE Fabry, PA-C 03/06/2024, 12:02 PM

## 2024-03-06 NOTE — Telephone Encounter (Signed)
 Patient with diagnosis of A Fib on Eliquis  for anticoagulation.    Procedure: colonoscopy Date of procedure: 05/01/24   CHA2DS2-VASc Score = 4  This indicates a 4.8% annual risk of stroke. The patient's score is based upon: CHF History: 0 HTN History: 1 Diabetes History: 1 Stroke History: 0 Vascular Disease History: 1 Age Score: 0 Gender Score: 1    CrCl 117 ml/min Platelet count 363K  Patient has not had an Afib/aflutter ablation in the last 3 months, DCCV within the last 4 weeks or a watchman implanted in the last 45 days    Per office protocol, patient can hold Eliquis  for 2 days prior to procedure.    **This guidance is not considered finalized until pre-operative APP has relayed final recommendations.**

## 2024-03-07 ENCOUNTER — Encounter: Payer: Self-pay | Admitting: Neuroradiology

## 2024-03-07 ENCOUNTER — Ambulatory Visit: Admitting: Neuroradiology

## 2024-03-07 VITALS — BP 142/86 | HR 82 | Temp 99.1°F | Ht 61.0 in | Wt 179.0 lb

## 2024-03-07 DIAGNOSIS — Z7901 Long term (current) use of anticoagulants: Secondary | ICD-10-CM | POA: Diagnosis not present

## 2024-03-07 DIAGNOSIS — I671 Cerebral aneurysm, nonruptured: Secondary | ICD-10-CM

## 2024-03-07 DIAGNOSIS — Z95828 Presence of other vascular implants and grafts: Secondary | ICD-10-CM

## 2024-03-07 NOTE — Progress Notes (Signed)
 I had the pleasure of seeing this nice lady in the office today to discuss the results of her arteriogram and future plans for monitoring her aneurysm.  Briefly, she had a 6 mm left paraophthalmic aneurysm treated with pipeline 02/23/2023.  The follow-up arteriogram 01/02/2024 showed complete treatment of the aneurysm.  Her radial puncture from the arteriogram is well-healed with a normal pulse.  I have explained that aneurysms of this size have an extremely low rate of recurrence after successful treatment with pipeline.  As such, future imaging studies for aneurysm surveillance are not necessary.  She is on apixaban  and low-dose aspirin  for her atrial fibrillation at the recommendation of her cardiologist.  From the standpoint of the pipeline device in her left intracranial carotid artery, low-dose aspirin  is sufficient if in the future she no longer needs the apixaban .  I have not scheduled any routine follow-up with me.  I am glad to see her again anytime as needed.

## 2024-03-08 ENCOUNTER — Telehealth: Payer: Self-pay | Admitting: Cardiology

## 2024-03-08 NOTE — Telephone Encounter (Signed)
 Patient calling to f/u on results from sleep study performed 11/17. Please advise.

## 2024-03-09 ENCOUNTER — Telehealth: Payer: Self-pay

## 2024-03-09 NOTE — Telephone Encounter (Signed)
 Patient calling again to f/u on results. Please advise.

## 2024-03-09 NOTE — Telephone Encounter (Signed)
 Spoke with patient and told her that, per cardiology, she could hold her Eliquis  for 2 days prior to her procedure.  Patient agreed.

## 2024-03-13 NOTE — Telephone Encounter (Signed)
 Patient calling again to follow up on sleep study. Advised her that Dr. Shlomo is out all week and the sleep study has not been read yet. She is eager to receive ENT referral.  Emma Stephens

## 2024-03-18 NOTE — Procedures (Signed)
 " Indications for Polysomnography The patient is a 53 year old Female who is 5' 1 and weighs 181.0 lbs. Her BMI equals 34.6.  A full night polysomnogram was performed to evaluate for -.  Medications taken at 2022.ELIQUISATORVASTATIN Polysomnogram Data A full night polysomnogram recorded the standard physiologic parameters including EEG, EOG, EMG, EKG, nasal and oral airflow.  Respiratory parameters of chest and abdominal movements were recorded with Respiratory Inductance Plethysmography belts.   Oxygen saturation was recorded by pulse oximetry.  Sleep Architecture The total recording time of the polysomnogram was 479.1 minutes.  The total sleep time was 423.0 minutes.  The patient spent 4.0% of total sleep time in Stage N1, 79.0% in Stage N2, 3.2% in Stages N3, and 13.8% in REM.  Sleep latency was 22.6 minutes.   REM latency was 258.5 minutes.  Sleep Efficiency was 88.3%.  Wake after Sleep Onset time was 34.0 minutes.  Respiratory Events The polysomnogram revealed a presence of 295 obstructive, 1 central, and 8 mixed apneas resulting in an Apnea index of 43.1 events per hour.  There were 250 hypopneas (GreaterEqual to3% desaturation and/or arousal) resulting in an Apnea\Hypopnea Index  (AHI GreaterEqual to3% desaturation and/or arousal) of 78.6 events per hour.  There were 231 hypopneas (GreaterEqual to4% desaturation) resulting in an Apnea\Hypopnea Index (AHI GreaterEqual to4% desaturation) of 75.9 events per hour.  There were 40  Respiratory Effort Related Arousals resulting in a RERA index of 5.7 events per hour. The Respiratory Disturbance Index is 84.3 events per hour.  The snore index was 592.1 events per hour.  Mean oxygen saturation was 93.5%.  The lowest oxygen saturation during sleep was 76.0%.  Time spent LessEqual to88% oxygen saturation was  minutes ().  Limb Activity There were 0 total limb movements recorded, of this total, 0 were classified as PLMs.  PLM index was 0 per hour and  PLM associated with Arousals index was 0 per hour.  Cardiac Summary The average pulse rate was 77.6 bpm.  The minimum pulse rate was 65.0 bpm while the maximum pulse rate was 110.0 bpm.  Cardiac rhythm was NSR   Diagnosis: Severe Obstructive Sleep Apnea Nocturnal Hypoxemia  Recommendations: 1.  Clinical correlation of these findings is necessary.  The decision to treat obstructive sleep apnea (OSA) is usually based on the presence of apnea symptoms or the presence of associated medical conditions such as Hypertension, Congestive Heart  Failure, Atrial Fibrillation or Obesity.  The most common symptoms of OSA are snoring, gasping for breath while sleeping, daytime sleepiness and fatigue.  2.  Initiating apnea therapy is recommended given the presence of symptoms and/or associated conditions. Recommend proceeding with one of the following:  a.  Auto-CPAP therapy with a pressure range of 5-20cm H2O.  b.  An oral appliance (OA) that can be obtained from certain dentists with expertise in sleep medicine.  These are primarily of use in non-obese patients with mild and moderate disease.  c.  An ENT consultation which may be useful to look for specific causes of obstruction and possible treatment options.  d.  If patient is intolerant to PAP therapy, consider referral to ENT for evaluation for hypoglossal nerve stimulator.  3.  Close follow-up is necessary to ensure success with CPAP or oral appliance therapy for maximum benefit.  4.  A follow-up oximetry study on CPAP is recommended to assess the adequacy of therapy and determine the need for supplemental oxygen or the potential need for Bi-level therapy.  An arterial blood gas to determine  the adequacy of baseline ventilation  and oxygenation should also be considered.  5.  Healthy sleep recommendations include:  adequate nightly sleep (normal 7-9 hrs/night), avoidance of caffeine  after noon and alcohol near bedtime, and maintaining a sleep  environment that is cool, dark and quiet.  6.  Weight loss for overweight patients is recommended.  Even modest amounts of weight loss can significantly improve the severity of sleep apnea.  7.  Snoring recommendations include:  weight loss where appropriate, side sleeping, and avoidance of alcohol before bed.  8.  Operation of motor vehicle should be avoided when sleepy.    This study was personally reviewed and electronically signed by: SHLOMO WILBERT SAUNDERS., MD Accredited Board Certified in Sleep Medicine Date/Time:03/18/2024 4:26PM "

## 2024-03-19 ENCOUNTER — Other Ambulatory Visit: Payer: Self-pay

## 2024-03-19 ENCOUNTER — Telehealth: Payer: Self-pay | Admitting: *Deleted

## 2024-03-19 DIAGNOSIS — G4733 Obstructive sleep apnea (adult) (pediatric): Secondary | ICD-10-CM

## 2024-03-19 NOTE — Telephone Encounter (Addendum)
 Return call: The patient has been notified of the result and verbalized understanding.  All questions (if any) were answered. [name], CMA 03/19/2024 2:02 PM     Patient is agreeable to being referred to ENT.

## 2024-03-19 NOTE — Telephone Encounter (Signed)
-----   Message from Wilbert Bihari, MD sent at 03/18/2024  4:28 PM EST ----- Please let patient know that she has severe OSA and would likely qualify for the Inspire device - please refer to Dr. Carlie with ENT for [facility] device

## 2024-03-19 NOTE — Telephone Encounter (Signed)
The patient has been notified of the result. Left detailed message on voicemail and informed patient to call back..Syniyah Bourne Green, CMA   

## 2024-03-29 ENCOUNTER — Encounter: Payer: Self-pay | Admitting: Audiology

## 2024-03-29 ENCOUNTER — Ambulatory Visit: Attending: Nurse Practitioner | Admitting: Audiology

## 2024-03-29 DIAGNOSIS — H9313 Tinnitus, bilateral: Secondary | ICD-10-CM | POA: Diagnosis present

## 2024-03-29 NOTE — Procedures (Signed)
" °  Outpatient Audiology and [facility] [address] [place], [place]  [address] [phone]  AUDIOLOGICAL  EVALUATION  NAME: Emma Stephens     DOB:   April 10, 1970      MRN: [id number]                                                                                     DATE: 03/29/2024     REFERENT: Katheen Roselie Rockford, NP STATUS: Outpatient DIAGNOSIS: Tinnitus  History: Eriko was seen for an audiological evaluation due to concerns regarding her tinnitus. Olayinka was accompanied to the appointment by her husband.  Karin reports her tinnitus has been severe for the past 1.5 months.  She reports the tinnitus sounds like a sharp piercing sound.  She reports her tinnitus is waking her up in her sleep. Lugene reports her hearing and her tinnitus may be slightly worse in the left ear.  She does have some concerns regarding her hearing sensitivity. Shuntia denies otalgia and dizziness. Emmajean reports her ears feel as if they are always full and cannot pop. Loralei has a history of a left periophthalmic aneurysm that was treated on 02/23/2023. Jersie has an appointment scheduled with [facility].   Evaluation:  Otoscopy showed a clear view of the tympanic membranes, bilaterally Tympanometry results were consistent with normal middle ear pressure and normal tympanic membrane mobility (Type A) bilaterally. Audiometric testing was completed using Conventional Audiometry techniques with insert earphones and TDH headphones. Test results are consistent with essentially normal hearing sensitivity with the exception of a mild hearing loss at 3000 to 4000 Hz. Speech Recognition Thresholds were obtained at 10 dB HL in the right ear and at 20 dB HL in the left ear. Word Recognition Testing was completed at 60 dB HL and Kaicee scored 100% bilaterally.  Results:  The test results were reviewed with Juanell and her husband. Test results are consistent with essentially normal hearing  sensitivity with the exception of a mild hearing loss at 3000 to 4000 Hz. Jaiya may have hearing and communication difficulty in noisy environments.  She will benefit from the use of good communication strategies.  Tinnitus and tinnitus management strategies were reviewed. Raiyah was encouraged to follow-up with ENT. Kimberli was also encouraged to schedule an appointment with the [facility] tinnitus clinic.   Recommendations: Use of tinnitus management strategies Evaluation with ENT With the [facility] tinnitus clinic for further tinnitus management   30 minutes spent testing and counseling on results.   If you have any questions please feel free to contact me at [phone].  Darryle Posey Audiologist, Au.D., CCC-A 03/29/2024  11:28 AM  Cc: Katheen Roselie Rockford, NP  "

## 2024-03-30 ENCOUNTER — Institutional Professional Consult (permissible substitution) (INDEPENDENT_AMBULATORY_CARE_PROVIDER_SITE_OTHER): Admitting: Physician Assistant

## 2024-04-03 ENCOUNTER — Encounter (INDEPENDENT_AMBULATORY_CARE_PROVIDER_SITE_OTHER): Payer: Self-pay | Admitting: Physician Assistant

## 2024-04-03 ENCOUNTER — Ambulatory Visit (INDEPENDENT_AMBULATORY_CARE_PROVIDER_SITE_OTHER): Admitting: Physician Assistant

## 2024-04-03 VITALS — BP 146/86 | HR 75 | Wt 179.0 lb

## 2024-04-03 DIAGNOSIS — H9313 Tinnitus, bilateral: Secondary | ICD-10-CM | POA: Diagnosis not present

## 2024-04-03 NOTE — Progress Notes (Signed)
 Dear Dr. Katheen, Here is my assessment for our mutual patient, Emma Stephens. Thank you for allowing me the opportunity to care for your patient. Please do not hesitate to contact me should you have any other questions. Sincerely, Chyrl Cohen PA-C  Otolaryngology Clinic Note Referring provider: Dr. Katheen HPI:  Emma Stephens is a 54 y.o. female kindly referred by Dr. Katheen   Discussed the use of AI scribe software for clinical note transcription with the patient, who gave verbal consent to proceed.  History of Present Illness     Emma Stephens is a 54 year old female with mild bilateral hearing loss who presents with bilateral tinnitus, left greater than right.  For at least two months, she has experienced persistent bilateral tinnitus, more severe on the left. The tinnitus is described as very loud, frequently disrupting her sleep and waking her twice nightly. She is unable to characterize the pitch. The tinnitus is aggravated in quiet environments, particularly at night, and focusing on the symptom increases its intensity. The symptom is not painful but is occasionally accompanied by a headache that resolves spontaneously. No specific intervention alleviates the tinnitus, though it improves with time.  She denies significant ear trauma, high-dose aspirin  use, chemotherapy, or other ototoxic medications. She previously used Aleve  and currently takes low-dose aspirin  (81 mg) for cardiac indications. She denies otalgia.  She experiences intermittent aural fullness, which she relieves by performing Valsalva maneuvers. This improves the sensation of fullness and her hearing but does not affect the tinnitus. She only performs this maneuver when the tinnitus is present and her own voice sounds lower. She denies allergies or chronic nasal congestion.        Independent Review of Additional Tests or Records:   Audiological evaluation- 03/29/2024      PMH/Meds/All/SocHx/FamHx/ROS:    Past Medical History:  Diagnosis Date   Acute gout 07/30/2014   Allergy    Anemia    Anxiety    Blood transfusion without reported diagnosis    Cigarette nicotine  dependence    Depression, recurrent 09/20/2023   DM (diabetes mellitus) (HCC) 09/10/2022   GERD (gastroesophageal reflux disease)    Gout 2011   Helicobacter pylori gastritis 02/19/2022   Hyperlipidemia associated with type 2 diabetes mellitus (HCC) 03/31/2023   Hypertension    PAF (paroxysmal atrial fibrillation) (HCC)    PVC (premature ventricular contraction)    Sleep apnea    not on cpap at this time 01-17-20     Past Surgical History:  Procedure Laterality Date   ATRIAL FIBRILLATION ABLATION N/A 06/29/2023   Procedure: ATRIAL FIBRILLATION ABLATION;  Surgeon: Kennyth Chew, MD;  Location: [facility] [place] INVASIVE CV LAB;  Service: Cardiovascular;  Laterality: N/A;   BIOPSY  02/21/2022   Procedure: BIOPSY;  Surgeon: Rollin Dover, MD;  Location: [facility] ENDOSCOPY;  Service: Gastroenterology;;   CHOLECYSTECTOMY N/A 02/23/2022   Procedure: LAPAROSCOPIC CHOLECYSTECTOMY with Lysis of Adhessions;  Surgeon: Lyndel Deward PARAS, MD;  Location: [facility] ORS;  Service: General;  Laterality: N/A;   ESOPHAGOGASTRODUODENOSCOPY (EGD) WITH PROPOFOL  N/A 02/21/2022   Procedure: ESOPHAGOGASTRODUODENOSCOPY (EGD) WITH PROPOFOL ;  Surgeon: Rollin Dover, MD;  Location: [facility] ENDOSCOPY;  Service: Gastroenterology;  Laterality: N/A;   IR 3D INDEPENDENT WKST  02/23/2023   IR 3D INDEPENDENT WKST  02/23/2023   IR ANGIO INTRA EXTRACRAN SEL COM CAROTID INNOMINATE UNI L MOD SED  01/02/2024   IR ANGIO INTRA EXTRACRAN SEL INTERNAL CAROTID BILAT MOD SED  02/23/2023   IR ANGIO VERTEBRAL SEL VERTEBRAL BILAT MOD SED  02/23/2023   IR ANGIOGRAM FOLLOW UP STUDY  02/23/2023   IR CT HEAD LTD  02/23/2023   IR TRANSCATH/EMBOLIZ  02/23/2023   IR US  GUIDE VASC ACCESS RIGHT  02/23/2023   IR US  GUIDE VASC ACCESS RIGHT  01/02/2024   NECK SURGERY     RADIOLOGY WITH ANESTHESIA N/A 02/23/2023    Procedure: Aneurysm embolization;  Surgeon: de Macedo Rodrigues, Katyucia, MD;  Location: [facility] OR;  Service: Radiology;  Laterality: N/A;   TUBAL LIGATION      Family History  Problem Relation Age of Onset   Diabetes Mother    Hypertension Mother    Cancer Father 25       oral   Diabetes Sister    Hypertension Sister    Diabetes Brother    Hypertension Brother    Breast cancer Paternal Grandmother    Sudden Cardiac Death Neg Hx    Heart attack Neg Hx    Colon cancer Neg Hx    Esophageal cancer Neg Hx    Rectal cancer Neg Hx    Stomach cancer Neg Hx      Social Connections: Moderately Integrated (09/20/2023)   Social Connection and Isolation Panel    Frequency of Communication with Friends and Family: More than three times a week    Frequency of Social Gatherings with Friends and Family: Once a week    Attends Religious Services: More than 4 times per year    Active Member of [employer] or Organizations: No    Attends Engineer, Structural: Not on file    Marital Status: Married     Current Medications[1]   Physical Exam:   BP (!) 146/86   Pulse 75   Wt 179 lb (81.2 kg)   SpO2 97%   BMI 33.82 kg/m   Pertinent Findings  CN II-XII grossly intact Bilateral EAC clear and TM intact with well pneumatized middle ear spaces Weber 512: equal Rinne 512: AC > BC b/l  Anterior rhinoscopy: Septum midline; bilateral inferior turbinates with minimal hypertrophy  No lesions of oral cavity/oropharynx; dentition WNL No obviously palpable neck masses/lymphadenopathy/thyromegaly No respiratory distress or stridor      Seprately Identifiable Procedures:  None  Impression & Plans:  Emma Stephens is a 54 y.o. female with the following   Assessment and Plan   Bilateral tinnitus-  No alarming features today..  Audiological evaluation with minimal symmetric sensorineural hearing loss.  Patient already has referral information for [facility] tinnitus clinic.  I am happy to see  her in the office for any further questions or concerns she may have.       - f/u PRN   Thank you for allowing me the opportunity to care for your patient. Please do not hesitate to contact me should you have any other questions.  Sincerely, Chyrl Cohen PA-C [facility] Phone: [phone] Fax: [phone]  04/03/2024, 9:56 AM        [1]  Current Outpatient Medications:    apixaban  (ELIQUIS ) 5 MG TABS tablet, Take 1 tablet (5 mg total) by mouth 2 (two) times daily., Disp: 180 tablet, Rfl: 3   aspirin  EC 81 MG tablet, Take 81 mg by mouth daily. Swallow whole., Disp: , Rfl:    atorvastatin  (LIPITOR) 20 MG tablet, TAKE 1 TABLET BY MOUTH EVERY DAY IN THE EVENING, Disp: 30 tablet, Rfl: 5   Cholecalciferol (VITAMIN D -3) 25 MCG (1000 UT) CAPS, Take 1,000 Units by mouth daily., Disp: , Rfl:  famotidine  (PEPCID ) 20 MG tablet, TAKE 1 TABLET BY MOUTH EVERY DAY, Disp: 30 tablet, Rfl: 5   losartan  (COZAAR ) 50 MG tablet, Take 1 tablet (50 mg total) by mouth in the morning., Disp: 90 tablet, Rfl: 3   metoprolol  succinate (TOPROL -XL) 50 MG 24 hr tablet, Take 1 tablet (50 mg total) by mouth daily., Disp: 90 tablet, Rfl: 2   Na Sulfate-K Sulfate-Mg Sulfate concentrate (SUPREP) 17.5-3.13-1.6 GM/177ML SOLN, TAKE 1 KIT (354 MLS TOTAL) BY MOUTH ONCE FOR 1 DOSE., Disp: , Rfl:    prenatal vitamin w/FE, FA (PRENATAL 1 + 1) 27-1 MG TABS tablet, Take 1 tablet by mouth daily at 12 noon. iron , Disp: , Rfl:    Semaglutide  (RYBELSUS ) 7 MG TABS, Take 1 tablet (7 mg total) by mouth daily., Disp: 30 tablet, Rfl: 5   spironolactone  (ALDACTONE ) 25 MG tablet, Take 1 tablet (25 mg total) by mouth daily., Disp: 90 tablet, Rfl: 3   venlafaxine  XR (EFFEXOR -XR) 75 MG 24 hr capsule, Take 1 capsule (75 mg total) by mouth daily with breakfast., Disp: 90 capsule, Rfl: 1

## 2024-04-12 ENCOUNTER — Telehealth: Payer: Self-pay | Admitting: Cardiology

## 2024-04-12 ENCOUNTER — Telehealth: Payer: Self-pay

## 2024-04-12 NOTE — Telephone Encounter (Signed)
"  ° °  Pre-operative Risk Assessment    Patient Name: Emma Stephens  DOB: [birth year] MRN: [id number]   Date of last office visit: 11/15/23 Date of next office visit:    Request for Surgical Clearance    Procedure:  DISE/ inspire   Date of Surgery:  05/03/2024                               Surgeon:  Dr. Vaughan Ricker  Surgeon's Group or Practice Name:  [facility]  Phone number:  [phone] Fax number:  [phone]   Type of Clearance Requested:   - Medical  - Pharmacy:  Hold Apixaban  (Eliquis )     Type of Anesthesia:  General    Additional requests/questions:    SignedKari Fuelling   04/12/2024, 10:37 AM   "

## 2024-04-12 NOTE — Telephone Encounter (Signed)
 I spoke with someone over at ENT who is requesting Surgical clearance from Dr. Lester.   I will discuss with Dr. Lester and fax back a clearance form.

## 2024-04-12 NOTE — Telephone Encounter (Signed)
 Please advise holding Eliquis  prior to DISE/ inspire procedure on 05/03/24. Last labs 02/2024.  Thank you!  AW

## 2024-04-16 NOTE — Telephone Encounter (Signed)
 Patient with diagnosis of afib on Eliquis  for anticoagulation.    Procedure: DISE/ inspire Date of procedure: 05/03/24   CHA2DS2-VASc Score = 4   This indicates a 4.8% annual risk of stroke. The patient's score is based upon: CHF History: 0 HTN History: 1 Diabetes History: 1 Stroke History: 0 Vascular Disease History: 1 Age Score: 0 Gender Score: 1      CrCl 86 ml/min Platelet count 363  Patient has not had an Afib/aflutter ablation in the last 3 months, DCCV within the last 4 weeks or a watchman implanted in the last 45 days   Per office protocol, patient can hold Eliquis  for 2 days prior to procedure.    **This guidance is not considered finalized until pre-operative APP has relayed final recommendations.**

## 2024-04-17 NOTE — Telephone Encounter (Signed)
" ° °  Name: Emma Stephens  DOB: 1970/09/08  MRN: [id number]  Primary Cardiologist: None   Preoperative team, please contact this patient and set up a phone call appointment for further preoperative risk assessment. Please obtain consent and complete medication review. Thank you for your help.  I confirm that guidance regarding antiplatelet and oral anticoagulation therapy has been completed and, if necessary, noted below. - Per office protocol, patient can hold Eliquis  for 2 days prior to procedure.    I also confirmed the patient resides in the state of [place] . As per [facility] telemedicine laws, the patient must reside in the state in which the provider is licensed.   Shina Wass E Melissia Lahman, PA-C 04/17/2024, 8:27 PM [facility]    "

## 2024-04-18 ENCOUNTER — Telehealth: Payer: Self-pay | Admitting: Gastroenterology

## 2024-04-18 ENCOUNTER — Encounter: Payer: Self-pay | Admitting: Cardiology

## 2024-04-18 ENCOUNTER — Telehealth (HOSPITAL_BASED_OUTPATIENT_CLINIC_OR_DEPARTMENT_OTHER): Payer: Self-pay | Admitting: *Deleted

## 2024-04-18 NOTE — Telephone Encounter (Signed)
 Pharmacy, please see note below. Is it reasonable for her to hold Eliquis  for 4 days for back to back procedures?  Thank you!

## 2024-04-18 NOTE — Telephone Encounter (Signed)
 [name],  This patient is currently scheduled for colonoscopy with Dr. Wilhelmenia on 05/01/2024.  I have been in discussion with her care team.  She has a procedure with ENT on 05/03/2024 which is going to take priority so we need to reschedule her colonoscopy for mid to late March or April.  Please also resend her Eliquis  hold request since we are changing her procedure date.  Thank you,  SH

## 2024-04-18 NOTE — Telephone Encounter (Signed)
 I s/w the pt and she has been scheduled tele preop appt 04/25/24. Med rec and consent are done. Pt tells me that she is having a colonoscopy 05/01/24 and was told to hold her Eliquis  x 2 days prior. She asked if this was going to be a problem with the other procedure she has with Dr. Carlie on 05/03/24 in regard to her blood thinner. I informed the pt that this will mean she will be holding her Eliquis  x 4 days which is too long. I explained the risk of being off the blood thinner longer than needed puts the pt at risk for cardiac events. I said that I will need to let the preop APP know of the new information that just came to me. I also stated that I will contact the GI office as well. I told the pt that we never received a clearance request from [facility] for colonoscopy.   I assured the pt that after careful review from the preop team we will provide recommendations to keep her safe while off her blood thinner. Pt thanked me for the help. I thanked the pt for letting us  know.   I am going to send this note back to preop APP to review and provide advice further in regard to GI procedure.    I sent a secure chat to [employer]. CMA at [facility]. In my chat GI CMA stated they received our clearance 03/01/25 see phone notes. I thanked Sonny for her help in this matter.

## 2024-04-18 NOTE — Telephone Encounter (Signed)
 Plan was discussed with Dr. Shlomo and GI team. Routine screening colonoscopy is going to be postponed and will continue with plans to have Inspire procedure on 05/03/2024.

## 2024-04-18 NOTE — Telephone Encounter (Signed)
"   If the second procedure can be moved up by a day, that would be ideal with the total 3 days hold ; otherwise, the procedures should be spaced at least 7-10 days apart "

## 2024-04-19 NOTE — Telephone Encounter (Signed)
 I left message for the pt per preop APP Josefa Beauvais, FNP to let her know the tele appt 04/25/24 will still be done on 04/25/24.     Message Details Received: Today Message Subject: RE: Sleep endoscopy appt   Cleaver, Josefa HERO, NP  P Cv Div Preop Callback      Sleep endoscopy appt (Newest Message First)            Beauvais Josefa HERO, NP to Cv Div Preop Callback (Selected Message)     04/19/24  7:16 AM Please contact patient and let her know that she may keep her phone appointment.  Thank you.   Josefa HERO. Cleaver NP-C      04/19/2024, 7:16 AM [facility] [address] [place], [place] [address] Office [phone]    04/18/24  5:05 PM Janit Geni CROME, RN routed this conversation to Cv Div Preop Janit Geni CROME, RN to [employer]     04/18/24  5:05 PM Hi Ms. Acri,  Let me get this information over to the pre-operative clearance department. Thanks for the update. Geni, RN  Last read by Annamae Furrow at 6:05PM on 04/18/2024.   04/18/24  1:41 PM Janit Geni CROME, RN routed this conversation to Janit Geni CROME, RN Cleopha Hitchcock to P Cv Div [facility] Triage (supporting Wilbert JONELLE Bihari, MD) AA    04/18/24 10:14 AM I just received a call from Dr. Carlie office.  We have moved my appointment from the 05/03/24 to 05/31/24. So I will be able to keep my colonoscopy appointment on 05/01/24. So do you want to reschedule the phone visit on 04/25/24 at 9am to another date?

## 2024-04-24 ENCOUNTER — Encounter: Payer: Self-pay | Admitting: Gastroenterology

## 2024-04-25 ENCOUNTER — Ambulatory Visit: Admitting: Physician Assistant

## 2024-04-25 DIAGNOSIS — Z01810 Encounter for preprocedural cardiovascular examination: Secondary | ICD-10-CM

## 2024-04-25 NOTE — Telephone Encounter (Signed)
 PT is requesting to speak to a nurse regarding what medications she needs to hold considering she will be having a colonoscopy on 2/10 and having another surgery two days later. Requesting a call back.

## 2024-04-25 NOTE — Progress Notes (Signed)
 "   Virtual Visit via Telephone Note   Because of Emma Stephens co-morbid illnesses, she is at least at moderate risk for complications without adequate follow up.  This format is felt to be most appropriate for this patient at this time.  Due to technical limitations with video connection (technology), today's appointment will be conducted as an audio only telehealth visit, and Emma Stephens verbally agreed to proceed in this manner.   All issues noted in this document were discussed and addressed.  No physical exam could be performed with this format.  Evaluation Performed:  Preoperative cardiovascular risk assessment _____________   Date:  04/25/2024   Patient ID:  Emma Stephens, DOB Mar 25, 1970, MRN [id number] Patient Location:  Home Provider location:   Office  Primary Care Provider:  Katheen Roselie Rockford, NP Primary Cardiologist:  None  Chief Complaint / Patient Profile   54 y.o. y/o female with a h/o OSA, PAF, PVCs, and hypertension who is pending drug-induced endoscopy and inspire implant and presents today for telephonic preoperative cardiovascular risk assessment.  History of Present Illness    Emma Stephens is a 54 y.o. female who presents via audio/video conferencing for a telehealth visit today.  Pt was last seen in cardiology clinic on 11/15/2023 by Dr. Shlomo.  At that time Emma Stephens was doing well.  The patient is now pending procedure as outlined above. Since her last visit, she overall feels great. She has not had any issues with her Afib or with her blood pressure. She is very active and for this reason she has surpassed 4 METS on the DASI.  She is unsure if her drug-induced endoscopy and inspire implant is going to happen on the same day.  However, she is cleared to hold her Eliquis  2 days prior to the procedure and resume when medically safe to do so.  She also mentions that on the 10th she is to have a colonoscopy.  We did not receive a request for this  and I did recommend that if she needs to hold her Eliquis  to perhaps reschedule a couple weeks out so she is not having to hold her Eliquis  twice back-to-back.  Per office protocol, patient can hold Eliquis  for 2 days prior to procedure.  Please resume when medically safe to do so.  Past Medical History    Past Medical History:  Diagnosis Date   Acute gout 07/30/2014   Allergy    Anemia    Anxiety    Blood transfusion without reported diagnosis    Cigarette nicotine  dependence    Depression, recurrent 09/20/2023   DM (diabetes mellitus) (HCC) 09/10/2022   GERD (gastroesophageal reflux disease)    Gout 2011   Helicobacter pylori gastritis 02/19/2022   Hyperlipidemia associated with type 2 diabetes mellitus (HCC) 03/31/2023   Hypertension    PAF (paroxysmal atrial fibrillation) (HCC)    PVC (premature ventricular contraction)    Sleep apnea    not on cpap at this time 01-17-20   Past Surgical History:  Procedure Laterality Date   ATRIAL FIBRILLATION ABLATION N/A 06/29/2023   Procedure: ATRIAL FIBRILLATION ABLATION;  Surgeon: Kennyth Chew, MD;  Location: [facility] INVASIVE CV LAB;  Service: Cardiovascular;  Laterality: N/A;   BIOPSY  02/21/2022   Procedure: BIOPSY;  Surgeon: Rollin Dover, MD;  Location: [facility];  Service: Gastroenterology;;   CHOLECYSTECTOMY N/A 02/23/2022   Procedure: LAPAROSCOPIC CHOLECYSTECTOMY with Lysis of Adhessions;  Surgeon: Lyndel Deward PARAS, MD;  Location: [facility];  Service: General;  Laterality:  N/A;   ESOPHAGOGASTRODUODENOSCOPY (EGD) WITH PROPOFOL  N/A 02/21/2022   Procedure: ESOPHAGOGASTRODUODENOSCOPY (EGD) WITH PROPOFOL ;  Surgeon: Rollin Dover, MD;  Location: [facility];  Service: Gastroenterology;  Laterality: N/A;   IR 3D INDEPENDENT WKST  02/23/2023   IR 3D INDEPENDENT WKST  02/23/2023   IR ANGIO INTRA EXTRACRAN SEL COM CAROTID INNOMINATE UNI L MOD SED  01/02/2024   IR ANGIO INTRA EXTRACRAN SEL INTERNAL CAROTID BILAT MOD SED  02/23/2023   IR ANGIO  VERTEBRAL SEL VERTEBRAL BILAT MOD SED  02/23/2023   IR ANGIOGRAM FOLLOW UP STUDY  02/23/2023   IR CT HEAD LTD  02/23/2023   IR TRANSCATH/EMBOLIZ  02/23/2023   IR US  GUIDE VASC ACCESS RIGHT  02/23/2023   IR US  GUIDE VASC ACCESS RIGHT  01/02/2024   NECK SURGERY     RADIOLOGY WITH ANESTHESIA N/A 02/23/2023   Procedure: Aneurysm embolization;  Surgeon: de Macedo Rodrigues, Katyucia, MD;  Location: [facility] OR;  Service: Radiology;  Laterality: N/A;   TUBAL LIGATION      Allergies  Allergies[1]  Home Medications    Prior to Admission medications  Medication Sig Start Date End Date Taking? Authorizing Provider  apixaban  (ELIQUIS ) 5 MG TABS tablet Take 1 tablet (5 mg total) by mouth 2 (two) times daily. 05/20/23   Kennyth Chew, MD  aspirin  EC 81 MG tablet Take 81 mg by mouth daily. Swallow whole.    [provider]  atorvastatin  (LIPITOR) 20 MG tablet TAKE 1 TABLET BY MOUTH EVERY DAY IN THE EVENING 02/03/24   Nche, Roselie Rockford, NP  Cholecalciferol (VITAMIN D -3) 25 MCG (1000 UT) CAPS Take 1,000 Units by mouth daily.    [provider]  famotidine  (PEPCID ) 20 MG tablet TAKE 1 TABLET BY MOUTH EVERY DAY 02/06/24   Nche, Roselie Rockford, NP  losartan  (COZAAR ) 50 MG tablet Take 1 tablet (50 mg total) by mouth in the morning. 08/09/23   Nche, Roselie Rockford, NP  metoprolol  succinate (TOPROL -XL) 50 MG 24 hr tablet Take 1 tablet (50 mg total) by mouth daily. 11/11/23   Aniceto Daphne CROME, NP  Na Sulfate-K Sulfate-Mg Sulfate concentrate (SUPREP) 17.5-3.13-1.6 GM/177ML SOLN TAKE 1 KIT (354 MLS TOTAL) BY MOUTH ONCE FOR 1 DOSE. 03/01/24   [provider]  prenatal vitamin w/FE, FA (PRENATAL 1 + 1) 27-1 MG TABS tablet Take 1 tablet by mouth daily at 12 noon. iron     [provider]  Semaglutide  (RYBELSUS ) 7 MG TABS Take 1 tablet (7 mg total) by mouth daily. 03/01/24   Nche, Roselie Rockford, NP  spironolactone  (ALDACTONE ) 25 MG tablet Take 1 tablet (25 mg total) by mouth daily. 08/09/23    Nche, Roselie Rockford, NP  venlafaxine  XR (EFFEXOR -XR) 75 MG 24 hr capsule Take 1 capsule (75 mg total) by mouth daily with breakfast. 03/02/24   Nche, Roselie Rockford, NP    Physical Exam    Vital Signs:  Emma Stephens does not have vital signs available for review today.  Given telephonic nature of communication, physical exam is limited. AAOx3. NAD. Normal affect.  Speech and respirations are unlabored.  Accessory Clinical Findings    None  Assessment & Plan    1.  Preoperative Cardiovascular Risk Assessment:  Emma Stephens's perioperative risk of a major cardiac event is 0.4% according to the Revised Cardiac Risk Index (RCRI).  Therefore, she is at low risk for perioperative complications.   Her functional capacity is excellent at 7.28 METs according to the [place] Activity Status Index (DASI). Recommendations: According  to ACC/AHA guidelines, no further cardiovascular testing needed.  The patient may proceed to surgery at acceptable risk.   Antiplatelet and/or Anticoagulation Recommendations: The patient should remain on Aspirin  without interruption.   Eliquis  (Apixaban ) can be held for 2 days prior to surgery.  Please resume post op when felt to be safe.     The patient was advised that if she develops new symptoms prior to surgery to contact our office to arrange for a follow-up visit, and she verbalized understanding.   A copy of this note will be routed to requesting surgeon.  Time:   Today, I have spent 15 minutes with the patient with telehealth technology discussing medical history, symptoms, and management plan.     Emma LOISE Fabry, PA-C  04/25/2024, 8:59 AM     [1]  Allergies Allergen Reactions   Lisinopril Hives, Swelling and Other (See Comments)    Angioedema and facial swelling   Bupropion  Itching and Other (See Comments)   Other     Antibiotics typically cause yeast   "

## 2024-05-01 ENCOUNTER — Encounter: Admitting: Gastroenterology

## 2024-05-03 ENCOUNTER — Ambulatory Visit (HOSPITAL_COMMUNITY): Admit: 2024-05-03 | Admitting: Otolaryngology

## 2024-05-03 ENCOUNTER — Encounter (HOSPITAL_COMMUNITY): Payer: Self-pay

## 2024-05-03 SURGERY — DRUG INDUCED SLEEP ENDOSCOPY
Anesthesia: General | Laterality: Bilateral
